# Patient Record
Sex: Male | Born: 1960 | Race: White | Hispanic: No | Marital: Married | Smoking: Former smoker
Health system: Southern US, Community
[De-identification: ages and names within clinical notes are randomized; demographics above are authoritative.]

## PROBLEM LIST (undated history)

## (undated) DIAGNOSIS — S8291XA Unspecified fracture of right lower leg, initial encounter for closed fracture: Secondary | ICD-10-CM

## (undated) DIAGNOSIS — R519 Headache, unspecified: Secondary | ICD-10-CM

## (undated) DIAGNOSIS — S066X9A Traumatic subarachnoid hemorrhage with loss of consciousness of unspecified duration, initial encounter: Secondary | ICD-10-CM

## (undated) DIAGNOSIS — E291 Testicular hypofunction: Secondary | ICD-10-CM

## (undated) DIAGNOSIS — E559 Vitamin D deficiency, unspecified: Secondary | ICD-10-CM

## (undated) DIAGNOSIS — Z8739 Personal history of other diseases of the musculoskeletal system and connective tissue: Secondary | ICD-10-CM

## (undated) DIAGNOSIS — K219 Gastro-esophageal reflux disease without esophagitis: Secondary | ICD-10-CM

## (undated) DIAGNOSIS — R7303 Prediabetes: Secondary | ICD-10-CM

## (undated) DIAGNOSIS — J449 Chronic obstructive pulmonary disease, unspecified: Secondary | ICD-10-CM

## (undated) DIAGNOSIS — S143XXA Injury of brachial plexus, initial encounter: Secondary | ICD-10-CM

## (undated) DIAGNOSIS — Z8781 Personal history of (healed) traumatic fracture: Secondary | ICD-10-CM

## (undated) DIAGNOSIS — E785 Hyperlipidemia, unspecified: Secondary | ICD-10-CM

## (undated) DIAGNOSIS — R51 Headache: Secondary | ICD-10-CM

## (undated) DIAGNOSIS — I10 Essential (primary) hypertension: Secondary | ICD-10-CM

## (undated) DIAGNOSIS — S066XAA Traumatic subarachnoid hemorrhage with loss of consciousness status unknown, initial encounter: Secondary | ICD-10-CM

## (undated) DIAGNOSIS — H9190 Unspecified hearing loss, unspecified ear: Secondary | ICD-10-CM

## (undated) DIAGNOSIS — S8292XA Unspecified fracture of left lower leg, initial encounter for closed fracture: Secondary | ICD-10-CM

## (undated) DIAGNOSIS — G8929 Other chronic pain: Secondary | ICD-10-CM

## (undated) HISTORY — DX: Personal history of other diseases of the musculoskeletal system and connective tissue: Z87.39

## (undated) HISTORY — DX: Essential (primary) hypertension: I10

## (undated) HISTORY — DX: Injury of brachial plexus, initial encounter: S14.3XXA

## (undated) HISTORY — DX: Unspecified fracture of left lower leg, initial encounter for closed fracture: S82.92XA

## (undated) HISTORY — DX: Traumatic subarachnoid hemorrhage with loss of consciousness status unknown, initial encounter: S06.6XAA

## (undated) HISTORY — DX: Personal history of (healed) traumatic fracture: Z87.81

## (undated) HISTORY — DX: Testicular hypofunction: E29.1

## (undated) HISTORY — DX: Vitamin D deficiency, unspecified: E55.9

## (undated) HISTORY — DX: Traumatic subarachnoid hemorrhage with loss of consciousness of unspecified duration, initial encounter: S06.6X9A

## (undated) HISTORY — DX: Unspecified hearing loss, unspecified ear: H91.90

## (undated) HISTORY — PX: HERNIA REPAIR: SHX51

## (undated) HISTORY — PX: UMBILICAL HERNIA REPAIR: SHX196

## (undated) HISTORY — DX: Unspecified fracture of right lower leg, initial encounter for closed fracture: S82.91XA

## (undated) HISTORY — PX: FRACTURE SURGERY: SHX138

---

## 2001-05-28 ENCOUNTER — Encounter: Payer: Self-pay | Admitting: Internal Medicine

## 2001-05-28 ENCOUNTER — Ambulatory Visit (HOSPITAL_COMMUNITY): Admission: RE | Admit: 2001-05-28 | Discharge: 2001-05-28 | Payer: Self-pay | Admitting: Internal Medicine

## 2004-07-16 ENCOUNTER — Ambulatory Visit (HOSPITAL_COMMUNITY): Admission: RE | Admit: 2004-07-16 | Discharge: 2004-07-16 | Payer: Self-pay | Admitting: Internal Medicine

## 2005-07-17 ENCOUNTER — Ambulatory Visit (HOSPITAL_COMMUNITY): Admission: RE | Admit: 2005-07-17 | Discharge: 2005-07-17 | Payer: Self-pay | Admitting: Internal Medicine

## 2006-07-25 ENCOUNTER — Ambulatory Visit (HOSPITAL_COMMUNITY): Admission: RE | Admit: 2006-07-25 | Discharge: 2006-07-25 | Payer: Self-pay | Admitting: Internal Medicine

## 2007-08-26 ENCOUNTER — Ambulatory Visit (HOSPITAL_COMMUNITY): Admission: RE | Admit: 2007-08-26 | Discharge: 2007-08-26 | Payer: Self-pay | Admitting: Internal Medicine

## 2008-10-14 ENCOUNTER — Ambulatory Visit (HOSPITAL_COMMUNITY): Admission: RE | Admit: 2008-10-14 | Discharge: 2008-10-14 | Payer: Self-pay | Admitting: Internal Medicine

## 2009-07-14 ENCOUNTER — Encounter: Admission: RE | Admit: 2009-07-14 | Discharge: 2009-07-14 | Payer: Self-pay | Admitting: General Surgery

## 2009-11-14 ENCOUNTER — Encounter (INDEPENDENT_AMBULATORY_CARE_PROVIDER_SITE_OTHER): Payer: Self-pay | Admitting: *Deleted

## 2009-11-17 ENCOUNTER — Encounter (INDEPENDENT_AMBULATORY_CARE_PROVIDER_SITE_OTHER): Payer: Self-pay | Admitting: *Deleted

## 2009-11-21 ENCOUNTER — Ambulatory Visit: Payer: Self-pay | Admitting: Gastroenterology

## 2009-12-08 ENCOUNTER — Ambulatory Visit: Payer: Self-pay | Admitting: Gastroenterology

## 2009-12-08 HISTORY — PX: COLONOSCOPY: SHX174

## 2009-12-14 LAB — HM COLONOSCOPY: HM Colonoscopy: NORMAL

## 2010-07-29 ENCOUNTER — Encounter: Payer: Self-pay | Admitting: Internal Medicine

## 2010-08-09 NOTE — Procedures (Signed)
Summary: Colonoscopy  Patient: [name] Note: All result statuses are Final unless otherwise noted.  Tests: (1) Colonoscopy (COL)   COL Colonoscopy           DONE     [facility]     [address]     [place], [place]  [address]           COLONOSCOPY PROCEDURE REPORT           PATIENT:  [name], [name]  MR#:  [id number]     BIRTHDATE:  [birth year], 49 yrs. old  GENDER:  male     ENDOSCOPIST:  Vania Rea. Jarold Motto, MD, [facility]     REF. BY:  Lucky Cowboy, M.D.     PROCEDURE DATE:  12/08/2009     PROCEDURE:  Average-risk screening colonoscopy     G0121     ASA CLASS:  Class I     INDICATIONS:  Routine Risk Screening     MEDICATIONS:   Fentanyl 100 mcg IV, Versed 10 mg           DESCRIPTION OF PROCEDURE:   After the risks benefits and     alternatives of the procedure were thoroughly explained, informed     consent was obtained.  Digital rectal exam was performed and     revealed no abnormalities.   The LB CF-[id number] endoscope     was introduced through the anus and advanced to the cecum, which     was identified by both the appendix and ileocecal valve, without     limitations.  The quality of the prep was excellent, using     MoviPrep.  The instrument was then slowly withdrawn as the colon     was fully examined.     <<PROCEDUREIMAGES>>           FINDINGS:  Scattered diverticula were found in the sigmoid to     descending colon segments.  Internal hemorrhoids were found.  No     polyps or cancers were seen.   Retroflexed views in the rectum     revealed no abnormalities.    The scope was then withdrawn from     the patient and the procedure completed.           COMPLICATIONS:  None     ENDOSCOPIC IMPRESSION:     1) Diverticula, scattered in the sigmoid to descending colon     segments     2) Internal hemorrhoids     3) No polyps or cancers     RECOMMENDATIONS:     1) high fiber diet     2) Continue current colorectal screening recommendations for  "routine risk" patients with a repeat colonoscopy in 10 years.     REPEAT EXAM:  No           ______________________________     Vania Rea. Jarold Motto, MD, [facility]           CC:           n.     eSIGNED:   Vania Rea. Patterson at 12/08/2009 01:56 PM           Doroteo Bradford, [id number]  Note: An exclamation mark (!) indicates a result that was not dispersed into the flowsheet. Document Creation Date: 12/08/2009 1:57 PM _______________________________________________________________________  (1) Order result status: Final Collection or observation date-time: 12/08/2009 13:51 Requested date-time:  Receipt date-time:  Reported date-time:  Referring Physician:   Ordering Physician: Onalee Hua  Jarold Motto [phone]) Specimen Source:  Source: Launa Grill Order Number: [id number] Lab site:   Appended Document: Colonoscopy    Clinical Lists Changes  Observations: Added new observation of COLONNXTDUE: 12/2019 (12/08/2009 14:17)

## 2010-08-09 NOTE — Miscellaneous (Signed)
Summary: [facility] Previsit/prep  Clinical Lists Changes  Medications: Added new medication of MOVIPREP 100 GM  SOLR (PEG-KCL-NACL-NASULF-NA ASC-C) As per prep instructions. - Signed Rx of MOVIPREP 100 GM  SOLR (PEG-KCL-NACL-NASULF-NA ASC-C) As per prep instructions.;  #1 x 0;  Signed;  Entered by: Wyona Almas RN;  Authorized by: Mardella Layman MD [facility];  Method used: Electronically to [facility] [address], [place], [place], [place]  [address], Ph: [phone], Fax: [phone] Observations: Added new observation of NKA: T (11/21/2009 10:59)    Prescriptions: MOVIPREP 100 GM  SOLR (PEG-KCL-NACL-NASULF-NA ASC-C) As per prep instructions.  #1 x 0   Entered by:   Wyona Almas RN   Authorized by:   Mardella Layman MD [facility]   Signed by:   Wyona Almas RN on 11/21/2009   Method used:   Electronically to        [facility] [address] (retail)       [address], [address]       [place], [place]  [address]       Ph: [phone]       Fax: [phone]   RxID:   [id number]

## 2010-08-09 NOTE — Letter (Signed)
Summary: [facility] Instructions  [facility]  [address] [place], [place] [address]   Phone: [phone]  Fax: [phone]       [name]    1960-10-06    MRN: [id number]        Procedure Day Dorna Bloom:  Farrell Ours  12/08/09     Arrival Time:  12:30pm     Procedure Time:  1:30pm    Location of Procedure:                    _X _  [facility] (4th Floor)                        PREPARATION FOR COLONOSCOPY WITH MOVIPREP   Starting 5 days prior to your procedure  SUNDAY 12/03/09  do not eat nuts, seeds, popcorn, corn, beans, peas,  salads, or any raw vegetables.  Do not take any fiber supplements (e.g. Metamucil, Citrucel, and Benefiber).  THE DAY BEFORE YOUR PROCEDURE         DATE: THURSDAY 12/07/09  1.  Drink clear liquids the entire day-NO SOLID FOOD  2.  Do not drink anything colored red or purple.  Avoid juices with pulp.  No orange juice.  3.  Drink at least 64 oz. (8 glasses) of fluid/clear liquids during the day to prevent dehydration and help the prep work efficiently.  CLEAR LIQUIDS INCLUDE: Water Jello Ice Popsicles Tea (sugar ok, no milk/cream) Powdered fruit flavored drinks Coffee (sugar ok, no milk/cream) Gatorade Juice: apple, white grape, white cranberry  Lemonade Clear bullion, consomm, broth Carbonated beverages (any kind) Strained chicken noodle soup Hard Candy                             4.  In the morning, mix first dose of MoviPrep solution:    Empty 1 Pouch A and 1 Pouch B into the disposable container    Add lukewarm drinking water to the top line of the container. Mix to dissolve    Refrigerate (mixed solution should be used within 24 hrs)  5.  Begin drinking the prep at 5:00 p.m. The MoviPrep container is divided by 4 marks.   Every 15 minutes drink the solution down to the next mark (approximately 8 oz) until the full liter is complete.   6.  Follow completed prep with 16 oz of clear liquid of your choice  (Nothing red or purple).  Continue to drink clear liquids until bedtime.  7.  Before going to bed, mix second dose of MoviPrep solution:    Empty 1 Pouch A and 1 Pouch B into the disposable container    Add lukewarm drinking water to the top line of the container. Mix to dissolve    Refrigerate  THE DAY OF YOUR PROCEDURE      DATE:  FRIDAY  12/08/09  Beginning at  8:30 a.m. (5 hours before procedure):         1. Every 15 minutes, drink the solution down to the next mark (approx 8 oz) until the full liter is complete.  2. Follow completed prep with 16 oz. of clear liquid of your choice.    3. You may drink clear liquids until  11:30am  (2 HOURS BEFORE PROCEDURE).   MEDICATION INSTRUCTIONS  Unless otherwise instructed, you should take regular prescription medications with a small sip of water   as early  as possible the morning of your procedure.           OTHER INSTRUCTIONS  You will need a responsible adult at least 51 years of age to accompany you and drive you home.   This person must remain in the waiting room during your procedure.  Wear loose fitting clothing that is easily removed.  Leave jewelry and other valuables at home.  However, you may wish to bring a book to read or  an iPod/MP3 player to listen to music as you wait for your procedure to start.  Remove all body piercing jewelry and leave at home.  Total time from sign-in until discharge is approximately 2-3 hours.  You should go home directly after your procedure and rest.  You can resume normal activities the  day after your procedure.  The day of your procedure you should not:   Drive   Make legal decisions   Operate machinery   Drink alcohol   Return to work  You will receive specific instructions about eating, activities and medications before you leave.    The above instructions have been reviewed and explained to me by   Wyona Almas RN  Nov 21, 2009 11:22 AM     I fully  understand and can verbalize these instructions _____________________________ Date _________

## 2010-08-09 NOTE — Letter (Signed)
Summary: Previsit letter  [facility]  [address] [place], [place] [address]   Phone: [phone]  Fax: [phone]       11/14/2009 MRN: [id number]  [name] [address] [place], [place]  [address]  Dear [name],  Welcome to the Gastroenterology Division at [facility].    You are scheduled to see a nurse for your pre-procedure visit on 11/21/2009 at 11:00AM on the 3rd floor at [facility], [address]  We ask that you try to arrive at our office 15 minutes prior to your appointment time to allow for check-in.  Your nurse visit will consist of discussing your medical and surgical history, your immediate family medical history, and your medications.    Please bring a complete list of all your medications or, if you prefer, bring the medication bottles and we will list them.  We will need to be aware of both prescribed and over the counter drugs.  We will need to know exact dosage information as well.  If you are on blood thinners (Coumadin, Plavix, Aggrenox, Ticlid, etc.) please call our office today/prior to your appointment, as we need to consult with your physician about holding your medication.   Please be prepared to read and sign documents such as consent forms, a financial agreement, and acknowledgement forms.  If necessary, and with your consent, a friend or relative is welcome to sit-in on the nurse visit with you.  Please bring your insurance card so that we may make a copy of it.  If your insurance requires a referral to see a specialist, please bring your referral form from your primary care physician.  No co-pay is required for this nurse visit.     If you cannot keep your appointment, please call [phone] to cancel or reschedule prior to your appointment date.  This allows [place] the opportunity to schedule an appointment for another patient in need of care.    Thank you for choosing [facility] for your medical needs.   We appreciate the opportunity to care for you.  Please visit [place] at our website  to learn more about our practice.                     Sincerely.                                                                                                                   The Gastroenterology Division

## 2012-04-02 ENCOUNTER — Other Ambulatory Visit (HOSPITAL_COMMUNITY): Payer: Self-pay | Admitting: Internal Medicine

## 2012-04-02 ENCOUNTER — Ambulatory Visit (HOSPITAL_COMMUNITY)
Admission: RE | Admit: 2012-04-02 | Discharge: 2012-04-02 | Disposition: A | Discharge status: Home / Self Care | Payer: BC Managed Care – PPO | Source: Ambulatory Visit | Attending: Internal Medicine | Admitting: Internal Medicine

## 2012-04-02 DIAGNOSIS — Z87891 Personal history of nicotine dependence: Secondary | ICD-10-CM | POA: Insufficient documentation

## 2012-04-02 DIAGNOSIS — I10 Essential (primary) hypertension: Secondary | ICD-10-CM

## 2012-08-02 ENCOUNTER — Encounter (HOSPITAL_COMMUNITY): Payer: Self-pay | Admitting: *Deleted

## 2012-08-02 ENCOUNTER — Emergency Department (INDEPENDENT_AMBULATORY_CARE_PROVIDER_SITE_OTHER): Payer: BC Managed Care – PPO

## 2012-08-02 ENCOUNTER — Emergency Department (HOSPITAL_COMMUNITY)
Admission: EM | Admit: 2012-08-02 | Discharge: 2012-08-02 | Disposition: A | Discharge status: Home / Self Care | Payer: BC Managed Care – PPO | Source: Home / Self Care | Attending: Family Medicine | Admitting: Family Medicine

## 2012-08-02 DIAGNOSIS — Z79899 Other long term (current) drug therapy: Secondary | ICD-10-CM | POA: Insufficient documentation

## 2012-08-02 DIAGNOSIS — J45909 Unspecified asthma, uncomplicated: Secondary | ICD-10-CM | POA: Insufficient documentation

## 2012-08-02 DIAGNOSIS — Z7982 Long term (current) use of aspirin: Secondary | ICD-10-CM | POA: Insufficient documentation

## 2012-08-02 DIAGNOSIS — J45901 Unspecified asthma with (acute) exacerbation: Secondary | ICD-10-CM

## 2012-08-02 DIAGNOSIS — E785 Hyperlipidemia, unspecified: Secondary | ICD-10-CM | POA: Insufficient documentation

## 2012-08-02 DIAGNOSIS — F17200 Nicotine dependence, unspecified, uncomplicated: Secondary | ICD-10-CM | POA: Insufficient documentation

## 2012-08-02 DIAGNOSIS — K219 Gastro-esophageal reflux disease without esophagitis: Secondary | ICD-10-CM | POA: Insufficient documentation

## 2012-08-02 HISTORY — DX: Hyperlipidemia, unspecified: E78.5

## 2012-08-02 HISTORY — DX: Gastro-esophageal reflux disease without esophagitis: K21.9

## 2012-08-02 MED ORDER — ALBUTEROL SULFATE (5 MG/ML) 0.5% IN NEBU
INHALATION_SOLUTION | RESPIRATORY_TRACT | Status: AC
  Filled 2012-08-02: qty 1

## 2012-08-02 MED ORDER — METHYLPREDNISOLONE SODIUM SUCC 125 MG IJ SOLR
125.0000 mg | Freq: Once | INTRAMUSCULAR | Status: AC
  Administered 2012-08-02: 125 mg via INTRAMUSCULAR

## 2012-08-02 MED ORDER — ALBUTEROL SULFATE (5 MG/ML) 0.5% IN NEBU
5.0000 mg | INHALATION_SOLUTION | Freq: Once | RESPIRATORY_TRACT | Status: AC
  Administered 2012-08-02: 5 mg via RESPIRATORY_TRACT

## 2012-08-02 MED ORDER — METHYLPREDNISOLONE SODIUM SUCC 125 MG IJ SOLR
INTRAMUSCULAR | Status: AC
  Filled 2012-08-02: qty 2

## 2012-08-02 MED ORDER — IPRATROPIUM BROMIDE 0.02 % IN SOLN
0.5000 mg | Freq: Once | RESPIRATORY_TRACT | Status: AC
  Administered 2012-08-02: 0.5 mg via RESPIRATORY_TRACT

## 2012-08-02 MED ORDER — MOXIFLOXACIN HCL 400 MG PO TABS: 400.0000 mg | ORAL_TABLET | Freq: Every day | ORAL | Status: DC

## 2012-08-02 NOTE — ED Provider Notes (Signed)
History     CSN: [id number]  Arrival date & time 08/02/12  1404   First MD Initiated Contact with Patient 08/02/12 1421      Chief Complaint  Patient presents with  . Shortness of Breath  . Cough    (Consider location/radiation/quality/duration/timing/severity/associated sxs/prior treatment) Patient is a 52 y.o. male presenting with shortness of breath and cough. The history is provided by the patient and the spouse.  Shortness of Breath  The current episode started 2 days ago. The problem has been gradually worsening. The problem is moderate. Associated symptoms include rhinorrhea, cough, shortness of breath and wheezing. He has inhaled smoke recently. His past medical history does not include asthma. Past medical history comments: smoker.  Cough Associated symptoms include rhinorrhea, shortness of breath and wheezing. His past medical history does not include asthma. Past medical history comments: smoker.    Past Medical History  Diagnosis Date  . Hyperlipidemia   . GERD (gastroesophageal reflux disease)     Past Surgical History  Procedure Date  . Hernia repair     No family history on file.  History  Substance Use Topics  . Smoking status: Current Every Day Smoker -- 0.5 packs/day    Types: Cigarettes  . Smokeless tobacco: Not on file  . Alcohol Use: Yes     Comment: occasional      Review of Systems  Constitutional: Positive for activity change, appetite change and fatigue.  HENT: Positive for congestion, rhinorrhea and postnasal drip.   Eyes: Negative.   Respiratory: Positive for cough, shortness of breath and wheezing.   Gastrointestinal: Negative.   Genitourinary: Negative.     Allergies  Review of patient's allergies indicates no known allergies.  Home Medications   Current Outpatient Rx  Name  Route  Sig  Dispense  Refill  . ASPIRIN 81 MG PO TABS   Oral   Take 81 mg by mouth daily.         Marland Kitchen VITAMIN D PO   Oral   Take by mouth.           . ESOMEPRAZOLE MAGNESIUM 40 MG PO CPDR   Oral   Take 40 mg by mouth daily before breakfast.         . MULTIVITAMIN PO   Oral   Take by mouth.         Marland Kitchen FISH OIL PO   Oral   Take by mouth.         . CRESTOR PO   Oral   Take by mouth.         Marland Kitchen LAMISIL PO   Oral   Take by mouth.         Marland Kitchen MOXIFLOXACIN HCL 400 MG PO TABS   Oral   Take 1 tablet (400 mg total) by mouth daily. One tab daily   7 tablet   0     BP 134/81  Pulse 90  Temp 97.3 F (36.3 C) (Oral)  Resp 22  SpO2 93%  Physical Exam  Nursing note and vitals reviewed. Constitutional: He is oriented to person, place, and time. He appears well-developed and well-nourished.  HENT:  Head: Normocephalic.  Right Ear: External ear normal.  Left Ear: External ear normal.  Mouth/Throat: Oropharynx is clear and moist.  Eyes: Pupils are equal, round, and reactive to light.  Neck: Normal range of motion. Neck supple.  Cardiovascular: Normal rate, regular rhythm and normal heart sounds.   Pulmonary/Chest: He has wheezes. He  has rales.  Lymphadenopathy:    He has no cervical adenopathy.  Neurological: He is alert and oriented to person, place, and time.  Skin: Skin is warm and dry.    ED Course  Procedures (including critical care time)  Labs Reviewed - No data to display Dg Chest 2 View  08/02/2012  *RADIOLOGY REPORT*  Clinical Data: Shortness of breath.  CHEST - 2 VIEW  Comparison: 04/02/2012  Findings: Airway thickening may reflect bronchitis or reactive airways disease.  No airspace opacity is identified to suggest bacterial pneumonia pattern.  Cardiac and mediastinal contours appear unremarkable.  No pleural effusion identified.  IMPRESSION:  1. Airway thickening may reflect bronchitis or reactive airways disease.  No airspace opacity is identified to suggest bacterial pneumonia pattern.   Original Report Authenticated By: Gaylyn Rong, M.D.      1. Acute asthmatic bronchitis       MDM   Sx improved and peak flow improved after neb.        Linna Hoff, MD 08/02/12 1504

## 2012-08-02 NOTE — ED Notes (Signed)
Breathing treatment in progress

## 2012-08-02 NOTE — ED Notes (Signed)
C/O dyspnea, productive cough, "tightness in lungs" since Friday.  "I just can't catch my breath".  I&E wheezing noted.  Denies fevers, denies hx asthma or COPD.

## 2012-08-03 ENCOUNTER — Emergency Department (HOSPITAL_COMMUNITY): Payer: BC Managed Care – PPO

## 2012-08-03 ENCOUNTER — Emergency Department (HOSPITAL_COMMUNITY)
Admission: EM | Admit: 2012-08-03 | Discharge: 2012-08-03 | Disposition: A | Discharge status: Home / Self Care | Payer: BC Managed Care – PPO | Attending: Emergency Medicine | Admitting: Emergency Medicine

## 2012-08-03 ENCOUNTER — Encounter (HOSPITAL_COMMUNITY): Payer: Self-pay | Admitting: Emergency Medicine

## 2012-08-03 DIAGNOSIS — J45909 Unspecified asthma, uncomplicated: Secondary | ICD-10-CM

## 2012-08-03 MED ORDER — ALBUTEROL SULFATE (5 MG/ML) 0.5% IN NEBU
5.0000 mg | INHALATION_SOLUTION | Freq: Once | RESPIRATORY_TRACT | Status: AC
  Administered 2012-08-03: 5 mg via RESPIRATORY_TRACT
  Filled 2012-08-03: qty 40

## 2012-08-03 MED ORDER — METHYLPREDNISOLONE 4 MG PO KIT: PACK | ORAL | Status: DC

## 2012-08-03 MED ORDER — IPRATROPIUM BROMIDE 0.02 % IN SOLN
0.5000 mg | Freq: Once | RESPIRATORY_TRACT | Status: AC
  Administered 2012-08-03: 0.5 mg via RESPIRATORY_TRACT
  Filled 2012-08-03: qty 2.5

## 2012-08-03 MED ORDER — ALBUTEROL SULFATE HFA 108 (90 BASE) MCG/ACT IN AERS
2.0000 | INHALATION_SPRAY | RESPIRATORY_TRACT | Status: DC | PRN
  Administered 2012-08-03: 2 via RESPIRATORY_TRACT
  Filled 2012-08-03: qty 6.7

## 2012-08-03 MED ORDER — AEROCHAMBER PLUS W/MASK MISC
Status: AC
  Administered 2012-08-03: 01:00:00
  Filled 2012-08-03: qty 1

## 2012-08-03 MED ORDER — ALBUTEROL SULFATE (5 MG/ML) 0.5% IN NEBU
5.0000 mg | INHALATION_SOLUTION | Freq: Once | RESPIRATORY_TRACT | Status: AC
  Administered 2012-08-03: 5 mg via RESPIRATORY_TRACT
  Filled 2012-08-03: qty 1

## 2012-08-03 NOTE — ED Notes (Signed)
PT. REPORTS PERSISTENT PRODUCTIVE COUGH WITH WHEEZING TODAY SEEN HERE THIS AFTERNOON DIAGNOSED WITH ASTHMATIC BRONCHITIS - PRESCRIBED WITH AVELOX ANTIBIOTIC.

## 2012-08-03 NOTE — ED Notes (Signed)
Pt. O2 continues to stay in high 80s on 2L O2, pulse ox changed fingers with no improvement. Dr. Read Drivers aware.

## 2012-08-03 NOTE — ED Provider Notes (Addendum)
History     CSN: [id number]  Arrival date & time 08/02/12  2354   First MD Initiated Contact with Patient 08/03/12 0014      Chief Complaint  Patient presents with  . Shortness of Breath    (Consider location/radiation/quality/duration/timing/severity/associated sxs/prior treatment) HPI This is a 52 year old male with a four-day history of cough, shortness of breath and wheezing. The symptoms were worse with activity and lying supine. His symptoms worsened yesterday and he was seen in our urgent care. He was given albuterol and Atrovent breathing treatment as well as 125 mg of IM Solu-Medrol. He was discharged on a seven-day course of Avelox. He was not given or prescribed an albuterol inhaler. His wheezing and shortness of breath worsened and he came to the ED a brief time ago. He describes the shortness of breath is moderate to severe. He was given an albuterol and Atrovent neb treatment in triage with significant improvement in his symptoms. He states he feels much better now. He denies fever, chills, nausea, vomiting, diarrhea or body aches. Chest x-ray showed bronchitic changes.  Past Medical History  Diagnosis Date  . Hyperlipidemia   . GERD (gastroesophageal reflux disease)     Past Surgical History  Procedure Date  . Hernia repair     No family history on file.  History  Substance Use Topics  . Smoking status: Current Every Day Smoker -- 0.5 packs/day    Types: Cigarettes  . Smokeless tobacco: Not on file  . Alcohol Use: Yes     Comment: occasional      Review of Systems  All other systems reviewed and are negative.    Allergies  Review of patient's allergies indicates no known allergies.  Home Medications   Current Outpatient Rx  Name  Route  Sig  Dispense  Refill  . ASPIRIN 81 MG PO TABS   Oral   Take 81 mg by mouth daily.         Marland Kitchen VITAMIN D PO   Oral   Take by mouth.         . ESOMEPRAZOLE MAGNESIUM 40 MG PO CPDR   Oral   Take 40 mg by  mouth daily before breakfast.         . MUCINEX PO   Oral   Take 1 tablet by mouth every 6 (six) hours as needed. For bronchitis symptoms         . MOXIFLOXACIN HCL 400 MG PO TABS   Oral   Take 1 tablet (400 mg total) by mouth daily. One tab daily   7 tablet   0   . MULTIVITAMIN PO   Oral   Take by mouth.         Marland Kitchen FISH OIL PO   Oral   Take by mouth.         . CRESTOR PO   Oral   Take by mouth.         Marland Kitchen LAMISIL PO   Oral   Take by mouth.           BP 172/91  Pulse 109  Temp 98.1 F (36.7 C) (Oral)  Resp 16  SpO2 94%  Physical Exam General: Well-developed, well-nourished male in no acute distress; appearance consistent with age of record HENT: normocephalic, atraumatic Eyes: pupils equal round and reactive to light; extraocular muscles intact Neck: supple Heart: regular rate and rhythm; tachycardic Lungs: Faint is respiratory and expiratory wheezes; no tachypnea Abdomen: soft; nondistended; nontenderbowel  sounds present Extremities: No deformity; full range of motion; pulses normal Neurologic: Awake, alert and oriented; motor function intact in all extremities and symmetric; no facial droop Skin: Warm and dry Psychiatric: Normal mood and affect    ED Course  Procedures (including critical care time)     MDM  Nursing notes and vitals signs, including pulse oximetry, reviewed.  Summary of this visit's results, reviewed by myself:  Imaging Studies: Dg Chest 2 View  08/03/2012  *RADIOLOGY REPORT*  Clinical Data: Shortness of breath  CHEST - 2 VIEW  Comparison: 08/02/2012  Findings: Mild central peribronchial thickening is similar to minimally increased.  No confluent airspace opacity.  No pleural effusion or pneumothorax.  Cardiac and mediastinal contours are otherwise within normal range.  IMPRESSION: Mild central peribronchial thickening without confluent airspace opacity.   Original Report Authenticated By: Jearld Lesch, M.D.    Dg  Chest 2 View  08/02/2012  *RADIOLOGY REPORT*  Clinical Data: Shortness of breath.  CHEST - 2 VIEW  Comparison: 04/02/2012  Findings: Airway thickening may reflect bronchitis or reactive airways disease.  No airspace opacity is identified to suggest bacterial pneumonia pattern.  Cardiac and mediastinal contours appear unremarkable.  No pleural effusion identified.  IMPRESSION:  1. Airway thickening may reflect bronchitis or reactive airways disease.  No airspace opacity is identified to suggest bacterial pneumonia pattern.   Original Report Authenticated By: Gaylyn Rong, M.D.    1:12 AM Patient's symptoms improved significantly. Some faint wheezing remains. We will provide him with an albuterol inhaler.  2:42 AM Wheezing resolved after second albuterol and Atrovent neb treatment but patient's oxygen saturation is 88% on room air. Will repeat chest x-ray.  4:21 AM Patient feeling better after third nebulizer treatment. Patient is oxygen saturation is 91-92% on room air. Will send him home on Medrol dose pack. He will continue his Avelox. He has been given an inhaler in shown how to use it.    Hanley Seamen, MD 08/03/12 0113  Hanley Seamen, MD 08/03/12 0981

## 2012-08-03 NOTE — ED Notes (Signed)
Pt. Reports breathing is feeling tight again and that he has started coughing. O2 88%-92% on RA. Dr. Read Drivers notified.

## 2013-04-12 ENCOUNTER — Other Ambulatory Visit (HOSPITAL_COMMUNITY): Payer: Self-pay | Admitting: Internal Medicine

## 2013-04-12 ENCOUNTER — Ambulatory Visit (HOSPITAL_COMMUNITY)
Admission: RE | Admit: 2013-04-12 | Discharge: 2013-04-12 | Disposition: A | Discharge status: Home / Self Care | Payer: BC Managed Care – PPO | Source: Ambulatory Visit | Attending: Internal Medicine | Admitting: Internal Medicine

## 2013-04-12 DIAGNOSIS — I10 Essential (primary) hypertension: Secondary | ICD-10-CM | POA: Insufficient documentation

## 2013-04-12 DIAGNOSIS — F17200 Nicotine dependence, unspecified, uncomplicated: Secondary | ICD-10-CM | POA: Insufficient documentation

## 2013-04-12 DIAGNOSIS — J8410 Pulmonary fibrosis, unspecified: Secondary | ICD-10-CM | POA: Insufficient documentation

## 2013-07-18 ENCOUNTER — Encounter: Payer: Self-pay | Admitting: Physician Assistant

## 2013-07-18 DIAGNOSIS — R7303 Prediabetes: Secondary | ICD-10-CM

## 2013-07-18 DIAGNOSIS — R7309 Other abnormal glucose: Secondary | ICD-10-CM | POA: Insufficient documentation

## 2013-07-18 DIAGNOSIS — E291 Testicular hypofunction: Secondary | ICD-10-CM | POA: Insufficient documentation

## 2013-07-18 DIAGNOSIS — E785 Hyperlipidemia, unspecified: Secondary | ICD-10-CM | POA: Insufficient documentation

## 2013-07-18 DIAGNOSIS — I10 Essential (primary) hypertension: Secondary | ICD-10-CM

## 2013-07-18 DIAGNOSIS — E559 Vitamin D deficiency, unspecified: Secondary | ICD-10-CM

## 2013-07-18 DIAGNOSIS — K219 Gastro-esophageal reflux disease without esophagitis: Secondary | ICD-10-CM

## 2013-07-18 DIAGNOSIS — E1169 Type 2 diabetes mellitus with other specified complication: Secondary | ICD-10-CM | POA: Insufficient documentation

## 2013-07-18 DIAGNOSIS — E782 Mixed hyperlipidemia: Secondary | ICD-10-CM | POA: Insufficient documentation

## 2013-07-20 ENCOUNTER — Encounter: Payer: Self-pay | Admitting: Physician Assistant

## 2013-07-20 ENCOUNTER — Ambulatory Visit (INDEPENDENT_AMBULATORY_CARE_PROVIDER_SITE_OTHER): Payer: BC Managed Care – PPO | Admitting: Physician Assistant

## 2013-07-20 VITALS — BP 122/78 | HR 68 | Temp 98.1°F | Resp 16 | Ht 72.5 in | Wt 222.0 lb

## 2013-07-20 DIAGNOSIS — E559 Vitamin D deficiency, unspecified: Secondary | ICD-10-CM

## 2013-07-20 DIAGNOSIS — I10 Essential (primary) hypertension: Secondary | ICD-10-CM

## 2013-07-20 DIAGNOSIS — Z79899 Other long term (current) drug therapy: Secondary | ICD-10-CM

## 2013-07-20 DIAGNOSIS — R7309 Other abnormal glucose: Secondary | ICD-10-CM

## 2013-07-20 DIAGNOSIS — R7303 Prediabetes: Secondary | ICD-10-CM

## 2013-07-20 DIAGNOSIS — E782 Mixed hyperlipidemia: Secondary | ICD-10-CM

## 2013-07-20 DIAGNOSIS — E785 Hyperlipidemia, unspecified: Secondary | ICD-10-CM

## 2013-07-20 LAB — LIPID PANEL
Cholesterol: 171 mg/dL (ref 0–200)
HDL: 53 mg/dL (ref >39)
LDL Cholesterol: 96 mg/dL (ref 0–99)
Total CHOL/HDL Ratio: 3.2 Ratio
Triglycerides: 108 mg/dL (ref <150)
VLDL: 22 mg/dL (ref 0–40)

## 2013-07-20 LAB — HEPATIC FUNCTION PANEL
ALT: 32 U/L (ref 0–53)
AST: 25 U/L (ref 0–37)
Albumin: 4.5 g/dL (ref 3.5–5.2)
Alkaline Phosphatase: 72 U/L (ref 39–117)
Bilirubin, Direct: 0.1 mg/dL (ref 0.0–0.3)
Indirect Bilirubin: 0.4 mg/dL (ref 0.0–0.9)
Total Bilirubin: 0.5 mg/dL (ref 0.3–1.2)
Total Protein: 6.8 g/dL (ref 6.0–8.3)

## 2013-07-20 LAB — BASIC METABOLIC PANEL WITH GFR
BUN: 14 mg/dL (ref 6–23)
CO2: 26 mEq/L (ref 19–32)
Calcium: 9.5 mg/dL (ref 8.4–10.5)
Chloride: 105 mEq/L (ref 96–112)
Creat: 0.73 mg/dL (ref 0.50–1.35)
GFR, Est African American: >89 mL/min
GFR, Est Non African American: >89 mL/min
Glucose, Bld: 93 mg/dL (ref 70–99)
Potassium: 4.1 mEq/L (ref 3.5–5.3)
Sodium: 138 mEq/L (ref 135–145)

## 2013-07-20 LAB — CBC WITH DIFFERENTIAL/PLATELET
Basophils Absolute: 0 10*3/uL (ref 0.0–0.1)
Basophils Relative: 0 % (ref 0–1)
Eosinophils Absolute: 0.2 10*3/uL (ref 0.0–0.7)
Eosinophils Relative: 2 % (ref 0–5)
HCT: 46.2 % (ref 39.0–52.0)
Hemoglobin: 15.8 g/dL (ref 13.0–17.0)
Lymphocytes Relative: 24 % (ref 12–46)
Lymphs Abs: 2.1 10*3/uL (ref 0.7–4.0)
MCH: 29.8 pg (ref 26.0–34.0)
MCHC: 34.2 g/dL (ref 30.0–36.0)
MCV: 87 fL (ref 78.0–100.0)
Monocytes Absolute: 0.6 10*3/uL (ref 0.1–1.0)
Monocytes Relative: 7 % (ref 3–12)
Neutro Abs: 6.1 10*3/uL (ref 1.7–7.7)
Neutrophils Relative %: 67 % (ref 43–77)
Platelets: 174 10*3/uL (ref 150–400)
RBC: 5.31 MIL/uL (ref 4.22–5.81)
RDW: 13.8 % (ref 11.5–15.5)
WBC: 9.1 10*3/uL (ref 4.0–10.5)

## 2013-07-20 LAB — TSH: TSH: 1.393 u[IU]/mL (ref 0.350–4.500)

## 2013-07-20 LAB — MAGNESIUM: Magnesium: 1.9 mg/dL (ref 1.5–2.5)

## 2013-07-20 NOTE — Progress Notes (Signed)
HPI Patient presents for 3 month follow up with hypertension, hyperlipidemia, prediabetes and vitamin D. Patient's blood pressure has been controlled at home, today their BP is BP: 122/78 mmHg  Patient denies chest pain, shortness of breath, dizziness.  Patient's cholesterol is diet controlled. In addition they are on lipitor and denies myalgias. The cholesterol last visit was LDL 86.  The patient has not been working on diet and exercise for prediabetes, and denies changes in vision, polys, and paresthesias. A1C 5.9.  Patient is on Vitamin D supplement.   Current Medications:  Current Outpatient Prescriptions on File Prior to Visit  Medication Sig Dispense Refill  . aspirin 81 MG tablet Take 81 mg by mouth daily.      Marland Kitchen atorvastatin (LIPITOR) 80 MG tablet Take 80 mg by mouth daily. 1/2-1 pill nightly      . Cholecalciferol (VITAMIN D PO) Take 10,000 Int'l Units by mouth.       . esomeprazole (NEXIUM) 40 MG capsule Take 40 mg by mouth daily before breakfast.      . Multiple Vitamins-Minerals (MULTIVITAMIN PO) Take by mouth.      . Omega-3 Fatty Acids (FISH OIL PO) Take by mouth.       No current facility-administered medications on file prior to visit.   Medical History:  Past Medical History  Diagnosis Date  . Hyperlipidemia   . GERD (gastroesophageal reflux disease)   . Hypertension   . Prediabetes   . Vitamin D deficiency   . Hypogonadism male   . Allergy    Allergies: No Known Allergies  ROS Constitutional: Denies fever, chills, headaches, insomnia, fatigue, night sweats Eyes: Denies redness, blurred vision, diplopia, discharge, itchy, watery eyes.  ENT: Denies congestion, post nasal drip, sore throat, earache, dental pain, Tinnitus, Vertigo, Sinus pain, snoring.  Cardio: Denies chest pain, palpitations, irregular heartbeat, dyspnea, diaphoresis, orthopnea, PND, claudication, edema Respiratory: denies cough, shortness of breath, wheezing.  Gastrointestinal: Denies dysphagia,  heartburn, AB pain/ cramps, N/V, diarrhea, constipation, hematemesis, melena, hematochezia,  hemorrhoids Genitourinary: Denies dysuria, frequency, urgency, nocturia, hesitancy, discharge, hematuria, flank pain Musculoskeletal: Denies myalgia, stiffness, pain, swelling and strain/sprain. Skin: Denies pruritis, rash, changing in skin lesion Neuro: Denies Weakness, tremor, incoordination, spasms, pain Psychiatric: Denies confusion, memory loss, sensory loss Endocrine: Denies change in weight, skin, hair change, nocturia Diabetic Polys, Denies visual blurring, hyper /hypo glycemic episodes, and paresthesia, Heme/Lymph: Denies Excessive bleeding, bruising, enlarged lymph nodes  Family history- Review and unchanged Social history- Review and unchanged Physical Exam: Filed Vitals:   07/20/13 0903  BP: 122/78  Pulse: 68  Temp: 98.1 F (36.7 C)  Resp: 16   Filed Weights   07/20/13 0903  Weight: 222 lb (100.699 kg)   General Appearance: Well nourished, in no apparent distress. Eyes: PERRLA, EOMs, conjunctiva no swelling or erythema Sinuses: No Frontal/maxillary tenderness ENT/Mouth: Ext aud canals clear, TMs without erythema, bulging. No erythema, swelling, or exudate on post pharynx.  Tonsils not swollen or erythematous. Hearing normal.  Neck: Supple, thyroid normal.  Respiratory: Respiratory effort normal, BS equal bilaterally without rales, rhonchi, wheezing or stridor.  Cardio: RRR with no MRGs. Brisk peripheral pulses without edema.  Abdomen: Soft, + BS.  Non tender, no guarding, rebound, hernias, masses. Lymphatics: Non tender without lymphadenopathy.  Musculoskeletal: Full ROM, 5/5 strength, normal gait.  Skin: Warm, dry without rashes, lesions, ecchymosis.  Neuro: Cranial nerves intact. Normal muscle tone, no cerebellar symptoms. Sensation intact.  Psych: Awake and oriented X 3, normal affect, Insight and Judgment  appropriate.   Assessment and Plan:  Hypertension: Continue  medication, monitor blood pressure at home.  Continue DASH diet. Cholesterol: Continue diet and exercise. Check cholesterol.  Pre-diabetes-Continue diet and exercise. Check A1C Vitamin D Def- check level and continue medications.   Continue diet and meds as discussed. Further disposition pending results of labs.  Vicie Mutters 9:21 AM

## 2013-07-20 NOTE — Patient Instructions (Signed)
 Bad carbs also include fruit juice, alcohol, and sweet tea. These are empty calories that do not signal to your brain that you are full.   Please remember the good carbs are still carbs which convert into sugar. So please measure them out no more than 1/2-1 cup of rice, oatmeal, pasta, and beans.  Veggies are however free foods! Pile them on.   I like lean protein at every meal such as chicken, turkey, pork chops, cottage cheese, etc. Just do not fry these meats and please center your meal around vegetable, the meats should be a side dish.   No all fruit is created equal. Please see the list below, the fruit at the bottom is higher in sugars than the fruit at the top   Cholesterol Cholesterol is a white, waxy, fat-like protein needed by your body in small amounts. The liver makes all the cholesterol you need. It is carried from the liver by the blood through the blood vessels. Deposits (plaque) may build up on blood vessel walls. This makes the arteries narrower and stiffer. Plaque increases the risk for heart attack and stroke. You cannot feel your cholesterol level even if it is very high. The only way to know is by a blood test to check your lipid (fats) levels. Once you know your cholesterol levels, you should keep a record of the test results. Work with your caregiver to to keep your levels in the desired range. WHAT THE RESULTS MEAN:  Total cholesterol is a rough measure of all the cholesterol in your blood.  LDL is the so-called bad cholesterol. This is the type that deposits cholesterol in the walls of the arteries. You want this level to be low.  HDL is the good cholesterol because it cleans the arteries and carries the LDL away. You want this level to be high.  Triglycerides are fat that the body can either burn for energy or store. High levels are closely linked to heart disease. DESIRED LEVELS:  Total cholesterol below 200.  LDL below 100 for people at risk, below 70 for very  high risk.  HDL above 50 is good, above 60 is best.  Triglycerides below 150. HOW TO LOWER YOUR CHOLESTEROL:  Diet.  Choose fish or white meat chicken and turkey, roasted or baked. Limit fatty cuts of red meat, fried foods, and processed meats, such as sausage and lunch meat.  Eat lots of fresh fruits and vegetables. Choose whole grains, beans, pasta, potatoes and cereals.  Use only small amounts of olive, corn or canola oils. Avoid butter, mayonnaise, shortening or palm kernel oils. Avoid foods with trans-fats.  Use skim/nonfat milk and low-fat/nonfat yogurt and cheeses. Avoid whole milk, cream, ice cream, egg yolks and cheeses. Healthy desserts include angel food cake, ginger snaps, animal crackers, hard candy, popsicles, and low-fat/nonfat frozen yogurt. Avoid pastries, cakes, pies and cookies.  Exercise.  A regular program helps decrease LDL and raises HDL.  Helps with weight control.  Do things that increase your activity level like gardening, walking, or taking the stairs.  Medication.  May be prescribed by your caregiver to help lowering cholesterol and the risk for heart disease.  You may need medicine even if your levels are normal if you have several risk factors. HOME CARE INSTRUCTIONS   Follow your diet and exercise programs as suggested by your caregiver.  Take medications as directed.  Have blood work done when your caregiver feels it is necessary. MAKE SURE YOU:   Understand   these instructions.  Will watch your condition.  Will get help right away if you are not doing well or get worse. Document Released: 03/19/2001 Document Revised: 09/16/2011 Document Reviewed: 04/07/2013 [employer] Patient Information 2014 [employer].  

## 2013-07-21 LAB — HEMOGLOBIN A1C
Hgb A1c MFr Bld: 6.2 % — ABNORMAL HIGH (ref <5.7)
Mean Plasma Glucose: 131 mg/dL — ABNORMAL HIGH (ref <117)

## 2013-07-21 LAB — VITAMIN D 25 HYDROXY (VIT D DEFICIENCY, FRACTURES): Vit D, 25-Hydroxy: 57 ng/mL (ref 30–89)

## 2013-10-19 ENCOUNTER — Other Ambulatory Visit: Payer: Self-pay | Admitting: Internal Medicine

## 2013-10-22 ENCOUNTER — Encounter: Payer: Self-pay | Admitting: Internal Medicine

## 2013-10-22 ENCOUNTER — Ambulatory Visit (INDEPENDENT_AMBULATORY_CARE_PROVIDER_SITE_OTHER): Payer: BC Managed Care – PPO | Admitting: Internal Medicine

## 2013-10-22 VITALS — BP 124/70 | HR 64 | Temp 97.7°F | Resp 16 | Ht 72.5 in | Wt 218.4 lb

## 2013-10-22 DIAGNOSIS — I10 Essential (primary) hypertension: Secondary | ICD-10-CM

## 2013-10-22 DIAGNOSIS — Z79899 Other long term (current) drug therapy: Secondary | ICD-10-CM

## 2013-10-22 DIAGNOSIS — E785 Hyperlipidemia, unspecified: Secondary | ICD-10-CM

## 2013-10-22 DIAGNOSIS — R7309 Other abnormal glucose: Secondary | ICD-10-CM

## 2013-10-22 DIAGNOSIS — R7303 Prediabetes: Secondary | ICD-10-CM

## 2013-10-22 DIAGNOSIS — E559 Vitamin D deficiency, unspecified: Secondary | ICD-10-CM

## 2013-10-22 LAB — CBC WITH DIFFERENTIAL/PLATELET
Basophils Absolute: 0.1 10*3/uL (ref 0.0–0.1)
Basophils Relative: 1 % (ref 0–1)
Eosinophils Absolute: 0.3 10*3/uL (ref 0.0–0.7)
Eosinophils Relative: 4 % (ref 0–5)
HCT: 43.9 % (ref 39.0–52.0)
Hemoglobin: 15.7 g/dL (ref 13.0–17.0)
Lymphocytes Relative: 23 % (ref 12–46)
Lymphs Abs: 2 10*3/uL (ref 0.7–4.0)
MCH: 30.3 pg (ref 26.0–34.0)
MCHC: 35.8 g/dL (ref 30.0–36.0)
MCV: 84.7 fL (ref 78.0–100.0)
Monocytes Absolute: 0.4 10*3/uL (ref 0.1–1.0)
Monocytes Relative: 5 % (ref 3–12)
Neutro Abs: 5.8 10*3/uL (ref 1.7–7.7)
Neutrophils Relative %: 67 % (ref 43–77)
Platelets: 154 10*3/uL (ref 150–400)
RBC: 5.18 MIL/uL (ref 4.22–5.81)
RDW: 13.8 % (ref 11.5–15.5)
WBC: 8.6 10*3/uL (ref 4.0–10.5)

## 2013-10-22 LAB — HEMOGLOBIN A1C
Hgb A1c MFr Bld: 5.9 % — ABNORMAL HIGH (ref <5.7)
Mean Plasma Glucose: 123 mg/dL — ABNORMAL HIGH (ref <117)

## 2013-10-22 NOTE — Patient Instructions (Signed)

## 2013-10-22 NOTE — Progress Notes (Signed)
Patient ID: [name], male   DOB: 1960-11-13, 53 y.o.   MRN: [id number]    This very nice 53 y.o. MWM presents for 54 month follow up with Hypertension, Hyperlipidemia, Pre-Diabetes and Vitamin D Deficiency.    Elevated BP predates since 2006 and has been monitored expectantly. BP has been controlled at home. Today's BP: 124/70 mmHg. Patient denies any cardiac type chest pain, palpitations, dyspnea/orthopnea/PND, dizziness, claudication, or dependent edema.   Hyperlipidemia is controlled with diet & meds. Last Cholesterol was 171, Triglycerides were 108, HDL 53 and LDL 96 in Jan 2015 - all at goal. Patient denies myalgias or other med SE's.    Also, the patient has history of PreDiabetes with A1c 6.0% in 2009 and last A1c was 6.2% in Jan 2015. Patient denies any symptoms of reactive hypoglycemia, diabetic polys, paresthesias or visual blurring.   Further, Patient has history of Vitamin D Deficiency with last vitamin D of 61 in Oct 2014. Patient supplements vitamin D without any suspected side-effects.  Medication Sig  . aspirin 81 MG tablet Take 81 mg by mouth daily.  Marland Kitchen atorvastatin (LIPITOR) 80 MG tablet Take 80 mg by mouth daily. 1/2-1 pill nightly  . Cholecalciferol (VITAMIN D PO) Take 10,000 Int'l Units by mouth.   . esomeprazole (NEXIUM) 40 MG capsule Take 40 mg by mouth daily before breakfast.  . montelukast (SINGULAIR) 10 MG tablet TAKE ONE TABLET BY MOUTH ONCE DAILY   . Multiple Vitamins-Minerals (MULTIVITAMIN PO) Take by mouth.  . Omega-3 Fatty Acids (FISH OIL PO) Take 1,000 mg by mouth 3 (three) times daily.    No Known Allergies  PMHx:   Past Medical History  Diagnosis Date  . Hyperlipidemia   . GERD (gastroesophageal reflux disease)   . Hypertension   . Prediabetes   . Vitamin D deficiency   . Hypogonadism male   . Allergy    FHx:    Reviewed / unchanged  SHx:    Reviewed / unchanged   Systems Review: Constitutional: Denies fever, chills, wt changes, headaches,  insomnia, fatigue, night sweats, change in appetite. Eyes: Denies redness, blurred vision, diplopia, discharge, itchy, watery eyes.  ENT: Denies discharge, congestion, post nasal drip, epistaxis, sore throat, earache, hearing loss, dental pain, tinnitus, vertigo, sinus pain, snoring.  CV: Denies chest pain, palpitations, irregular heartbeat, syncope, dyspnea, diaphoresis, orthopnea, PND, claudication, edema. Respiratory: denies cough, dyspnea, DOE, pleurisy, hoarseness, laryngitis, wheezing.  Gastrointestinal: Denies dysphagia, odynophagia, heartburn, reflux, water brash, abdominal pain or cramps, nausea, vomiting, bloating, diarrhea, constipation, hematemesis, melena, hematochezia,  or hemorrhoids. Genitourinary: Denies dysuria, frequency, urgency, nocturia, hesitancy, discharge, hematuria, flank pain. Musculoskeletal: Denies arthralgias, myalgias, stiffness, jt. swelling, pain, limp, strain/sprain.  Skin: Denies pruritus, rash, hives, warts, acne, eczema, change in skin lesion(s). Neuro: No weakness, tremor, incoordination, spasms, paresthesia, or pain. Psychiatric: Denies confusion, memory loss, or sensory loss. Endo: Denies change in weight, skin, hair change.  Heme/Lymph: No excessive bleeding, bruising, orenlarged lymph nodes.   Exam:  BP 124/70  Pulse 64  Temp 97.7 F   Resp 16  Ht 6' 0.5"   Wt 218 lb 6.4 oz   BMI 29.20 kg/m2   Appears well nourished - in no distress. Eyes: PERRLA, EOMs, conjunctiva no swelling or erythema. Sinuses: No frontal/maxillary tenderness ENT/Mouth: EAC's clear, TM's nl w/o erythema, bulging. Nares clear w/o erythema, swelling, exudates. Oropharynx clear without erythema or exudates. Oral hygiene is good. Tongue normal, non obstructing. Hearing intact.  Neck: Supple. Thyroid nl. Car 2+/2+ without bruits,  nodes or JVD. Chest: Respirations nl with BS clear & equal w/o rales, rhonchi, wheezing or stridor.  Cor: Heart sounds normal w/ regular rate and  rhythm without sig. murmurs, gallops, clicks, or rubs. Peripheral pulses normal and equal  without edema.  Abdomen: Soft & bowel sounds normal. Non-tender w/o guarding, rebound, hernias, masses, or organomegaly.  Lymphatics: Unremarkable.  Musculoskeletal: Full ROM all peripheral extremities, joint stability, 5/5 strength, and normal gait.  Skin: Warm, dry without exposed rashes, lesions, ecchymosis apparent.  Neuro: Cranial nerves intact, reflexes equal bilaterally. Sensory-motor testing grossly intact. Tendon reflexes grossly intact.  Pysch: Alert & oriented x 3. Insight and judgement nl & appropriate. No ideations.  Assessment and Plan:  1. Hypertension, Labile - Continue monitor blood pressure at home. Continue diet/meds same.  2. Hyperlipidemia - Continue diet/meds, exercise,& lifestyle modifications. Continue monitor periodic cholesterol/liver & renal functions   3. Pre-diabetes/Insulin Resistance - Continue diet, exercise, lifestyle modifications. Monitor appropriate labs.  4. Vitamin D Deficiency - Continue supplementation.  Recommended regular exercise, BP monitoring, weight control, and discussed med and SE's. Recommended labs to assess and monitor clinical status. Further disposition pending results of labs.

## 2013-10-23 LAB — BASIC METABOLIC PANEL WITH GFR
BUN: 19 mg/dL (ref 6–23)
CO2: 26 mEq/L (ref 19–32)
Calcium: 9.3 mg/dL (ref 8.4–10.5)
Chloride: 106 mEq/L (ref 96–112)
Creat: 0.75 mg/dL (ref 0.50–1.35)
GFR, Est African American: >89 mL/min
GFR, Est Non African American: >89 mL/min
Glucose, Bld: 101 mg/dL — ABNORMAL HIGH (ref 70–99)
Potassium: 4.3 mEq/L (ref 3.5–5.3)
Sodium: 141 mEq/L (ref 135–145)

## 2013-10-23 LAB — TSH: TSH: 1.357 u[IU]/mL (ref 0.350–4.500)

## 2013-10-23 LAB — LIPID PANEL
Cholesterol: 153 mg/dL (ref 0–200)
HDL: 47 mg/dL (ref >39)
LDL Cholesterol: 88 mg/dL (ref 0–99)
Total CHOL/HDL Ratio: 3.3 Ratio
Triglycerides: 90 mg/dL (ref <150)
VLDL: 18 mg/dL (ref 0–40)

## 2013-10-23 LAB — HEPATIC FUNCTION PANEL
ALT: 29 U/L (ref 0–53)
AST: 25 U/L (ref 0–37)
Albumin: 4.3 g/dL (ref 3.5–5.2)
Alkaline Phosphatase: 77 U/L (ref 39–117)
Bilirubin, Direct: 0.1 mg/dL (ref 0.0–0.3)
Indirect Bilirubin: 0.3 mg/dL (ref 0.2–1.2)
Total Bilirubin: 0.4 mg/dL (ref 0.2–1.2)
Total Protein: 6.6 g/dL (ref 6.0–8.3)

## 2013-10-23 LAB — VITAMIN D 25 HYDROXY (VIT D DEFICIENCY, FRACTURES): Vit D, 25-Hydroxy: 56 ng/mL (ref 30–89)

## 2013-10-23 LAB — INSULIN, FASTING: Insulin fasting, serum: 11 u[IU]/mL (ref 3–28)

## 2013-10-23 LAB — MAGNESIUM: Magnesium: 1.9 mg/dL (ref 1.5–2.5)

## 2013-11-10 ENCOUNTER — Other Ambulatory Visit: Payer: Self-pay | Admitting: Internal Medicine

## 2013-11-10 MED ORDER — ESOMEPRAZOLE MAGNESIUM 40 MG PO CPDR: 40.0000 mg | DELAYED_RELEASE_CAPSULE | Freq: Every day | ORAL | Status: DC

## 2013-11-22 ENCOUNTER — Other Ambulatory Visit: Payer: Self-pay | Admitting: Internal Medicine

## 2014-01-26 ENCOUNTER — Ambulatory Visit: Payer: Self-pay | Admitting: Emergency Medicine

## 2014-02-08 ENCOUNTER — Other Ambulatory Visit: Payer: Self-pay | Admitting: Internal Medicine

## 2014-02-11 ENCOUNTER — Encounter: Payer: Self-pay | Admitting: Physician Assistant

## 2014-02-11 ENCOUNTER — Ambulatory Visit (INDEPENDENT_AMBULATORY_CARE_PROVIDER_SITE_OTHER): Payer: BC Managed Care – PPO | Admitting: Physician Assistant

## 2014-02-11 VITALS — BP 132/80 | HR 64 | Temp 97.7°F | Resp 16 | Ht 72.5 in | Wt 223.0 lb

## 2014-02-11 DIAGNOSIS — E291 Testicular hypofunction: Secondary | ICD-10-CM

## 2014-02-11 DIAGNOSIS — E785 Hyperlipidemia, unspecified: Secondary | ICD-10-CM

## 2014-02-11 DIAGNOSIS — Z79899 Other long term (current) drug therapy: Secondary | ICD-10-CM

## 2014-02-11 DIAGNOSIS — I10 Essential (primary) hypertension: Secondary | ICD-10-CM

## 2014-02-11 DIAGNOSIS — R7303 Prediabetes: Secondary | ICD-10-CM

## 2014-02-11 DIAGNOSIS — R7309 Other abnormal glucose: Secondary | ICD-10-CM

## 2014-02-11 DIAGNOSIS — E559 Vitamin D deficiency, unspecified: Secondary | ICD-10-CM

## 2014-02-11 LAB — CBC WITH DIFFERENTIAL/PLATELET
Basophils Absolute: 0 10*3/uL (ref 0.0–0.1)
Basophils Relative: 0 % (ref 0–1)
Eosinophils Absolute: 0.4 10*3/uL (ref 0.0–0.7)
Eosinophils Relative: 4 % (ref 0–5)
HCT: 43.8 % (ref 39.0–52.0)
Hemoglobin: 15.4 g/dL (ref 13.0–17.0)
Lymphocytes Relative: 26 % (ref 12–46)
Lymphs Abs: 2.7 10*3/uL (ref 0.7–4.0)
MCH: 30.1 pg (ref 26.0–34.0)
MCHC: 35.2 g/dL (ref 30.0–36.0)
MCV: 85.5 fL (ref 78.0–100.0)
Monocytes Absolute: 0.5 10*3/uL (ref 0.1–1.0)
Monocytes Relative: 5 % (ref 3–12)
Neutro Abs: 6.8 10*3/uL (ref 1.7–7.7)
Neutrophils Relative %: 65 % (ref 43–77)
Platelets: 181 10*3/uL (ref 150–400)
RBC: 5.12 MIL/uL (ref 4.22–5.81)
RDW: 13.7 % (ref 11.5–15.5)
WBC: 10.5 10*3/uL (ref 4.0–10.5)

## 2014-02-11 LAB — TSH: TSH: 1.056 u[IU]/mL (ref 0.350–4.500)

## 2014-02-11 LAB — BASIC METABOLIC PANEL WITH GFR
BUN: 16 mg/dL (ref 6–23)
CO2: 24 mEq/L (ref 19–32)
Calcium: 9.1 mg/dL (ref 8.4–10.5)
Chloride: 105 mEq/L (ref 96–112)
Creat: 0.74 mg/dL (ref 0.50–1.35)
GFR, Est African American: >89 mL/min
GFR, Est Non African American: >89 mL/min
Glucose, Bld: 103 mg/dL — ABNORMAL HIGH (ref 70–99)
Potassium: 4.2 mEq/L (ref 3.5–5.3)
Sodium: 139 mEq/L (ref 135–145)

## 2014-02-11 LAB — HEPATIC FUNCTION PANEL
ALT: 27 U/L (ref 0–53)
AST: 24 U/L (ref 0–37)
Albumin: 4.4 g/dL (ref 3.5–5.2)
Alkaline Phosphatase: 67 U/L (ref 39–117)
Bilirubin, Direct: 0.1 mg/dL (ref 0.0–0.3)
Indirect Bilirubin: 0.2 mg/dL (ref 0.2–1.2)
Total Bilirubin: 0.3 mg/dL (ref 0.2–1.2)
Total Protein: 6.6 g/dL (ref 6.0–8.3)

## 2014-02-11 LAB — LIPID PANEL
Cholesterol: 158 mg/dL (ref 0–200)
HDL: 51 mg/dL (ref >39)
LDL Cholesterol: 71 mg/dL (ref 0–99)
Total CHOL/HDL Ratio: 3.1 Ratio
Triglycerides: 178 mg/dL — ABNORMAL HIGH (ref <150)
VLDL: 36 mg/dL (ref 0–40)

## 2014-02-11 LAB — MAGNESIUM: Magnesium: 1.9 mg/dL (ref 1.5–2.5)

## 2014-02-11 LAB — HEMOGLOBIN A1C
Hgb A1c MFr Bld: 5.9 % — ABNORMAL HIGH (ref <5.7)
Mean Plasma Glucose: 123 mg/dL — ABNORMAL HIGH (ref <117)

## 2014-02-11 NOTE — Patient Instructions (Signed)

## 2014-02-11 NOTE — Progress Notes (Signed)
Assessment and Plan:  Hypertension: Continue medication, monitor blood pressure at home. Continue DASH diet. Cholesterol: Continue diet and exercise. Check cholesterol.  Pre-diabetes-Continue diet and exercise. Check A1C Vitamin D Def- check level and continue medications.   Continue diet and meds as discussed. Further disposition pending results of labs.  HPI 53 y.o. male  presents for 3 month follow up with hypertension, hyperlipidemia, prediabetes and vitamin D. His blood pressure has been controlled at home, today their BP is BP: 132/80 mmHg He does workout, some walking and during hunting season he walks a lot in the woods. He denies chest pain, shortness of breath, dizziness.  He is on cholesterol medication, 80mg  1/2 pill a day, and denies myalgias. His cholesterol is at goal. The cholesterol last visit was:   Lab Results  Component Value Date   CHOL 153 10/22/2013   HDL 47 10/22/2013   LDLCALC 88 10/22/2013   TRIG 90 10/22/2013   CHOLHDL 3.3 10/22/2013   He has been working on diet and exercise for prediabetes, and denies paresthesia of the feet, polydipsia and polyuria. Last A1C in the office was:  Lab Results  Component Value Date   HGBA1C 5.9* 10/22/2013   Patient is on Vitamin D supplement.   Lab Results  Component Value Date   VD25OH 46 10/22/2013     He is has a history of testosterone deficiency but is not on replacement.    Current Medications:  Current Outpatient Prescriptions on File Prior to Visit  Medication Sig Dispense Refill  . aspirin 81 MG tablet Take 81 mg by mouth daily.      Marland Kitchen atorvastatin (LIPITOR) 80 MG tablet TAKE ONE TABLET BY MOUTH ONCE DAILY FOR CHOLESTEROL  30 tablet  3  . Cholecalciferol (VITAMIN D PO) Take 10,000 Int'l Units by mouth.       . esomeprazole (NEXIUM) 40 MG capsule Take 1 capsule (40 mg total) by mouth daily before breakfast. For acid indigestion and reflux  30 capsule  99  . montelukast (SINGULAIR) 10 MG tablet TAKE ONE TABLET BY  MOUTH ONCE DAILY FOR ALLERGIES AND ASTHMA  30 tablet  6  . Multiple Vitamins-Minerals (MULTIVITAMIN PO) Take by mouth.      . Omega-3 Fatty Acids (FISH OIL PO) Take 1,000 mg by mouth 3 (three) times daily.        No current facility-administered medications on file prior to visit.   Medical History:  Past Medical History  Diagnosis Date  . Hyperlipidemia   . GERD (gastroesophageal reflux disease)   . Hypertension   . Prediabetes   . Vitamin D deficiency   . Hypogonadism male   . Allergy    Allergies: No Known Allergies   Review of Systems: [X]  = complains of  [ ]  = denies  General: Fatigue [ ]  Fever [ ]  Chills [ ]  Weakness [ ]   Insomnia [ ]  Eyes: Redness [ ]  Blurred vision [ ]  Diplopia [ ]   ENT: Congestion [ ]  Sinus Pain [ ]  Post Nasal Drip [ ]  Sore Throat [ ]  Earache [ ]   Cardiac: Chest pain/pressure [ ]  SOB [ ]  Orthopnea [ ]   Palpitations [ ]   Paroxysmal nocturnal dyspnea[ ]  Claudication [ ]  Edema [ ]   Pulmonary: Cough [ ]  Wheezing[ ]   SOB [ ]   Snoring [ ]   GI: Nausea [ ]  Vomiting[ ]  Dysphagia[ ]  Heartburn[ ]  Abdominal pain [ ]  Constipation [ ] ; Diarrhea [ ] ; BRBPR [ ]  Melena[ ]  GU: Hematuria[ ]   Dysuria [ ]  Nocturia[ ]  Urgency [ ]   Hesitancy [ ]  Discharge [ ]  Neuro: Headaches[ ]  Vertigo[ ]  Paresthesias[ ]  Spasm [ ]  Speech changes [ ]  Incoordination [ ]   Ortho: Arthritis [ ]  Joint pain [ ]  Muscle pain [ ]  Joint swelling [ ]  Back Pain [ ]  Skin:  Rash [ ]   Pruritis [ ]  Change in skin lesion [ ]   Psych: Depression[ ]  Anxiety[ ]  Confusion [ ]  Memory loss [ ]   Heme/Lypmh: Bleeding [ ]  Bruising [ ]  Enlarged lymph nodes [ ]   Endocrine: Visual blurring [ ]  Paresthesia [ ]  Polyuria [ ]  Polydypsea [ ]    Heat/cold intolerance [ ]  Hypoglycemia [ ]   Family history- Review and unchanged Social history- Review and unchanged Physical Exam: BP 132/80  Pulse 64  Temp(Src) 97.7 F (36.5 C)  Resp 16  Ht 6' 0.5" (1.842 m)  Wt 223 lb (101.152 kg)  BMI 29.81 kg/m2 Wt Readings from Last 3  Encounters:  02/11/14 223 lb (101.152 kg)  10/22/13 218 lb 6.4 oz (99.066 kg)  07/20/13 222 lb (100.699 kg)   General Appearance: Well nourished, in no apparent distress. Eyes: PERRLA, EOMs, conjunctiva no swelling or erythema Sinuses: No Frontal/maxillary tenderness ENT/Mouth: Ext aud canals clear, TMs without erythema, bulging. No erythema, swelling, or exudate on post pharynx.  Tonsils not swollen or erythematous. Hearing normal.  Neck: Supple, thyroid normal.  Respiratory: Respiratory effort normal, BS equal bilaterally without rales, rhonchi, wheezing or stridor.  Cardio: RRR with no MRGs. Brisk peripheral pulses without edema.  Abdomen: Soft, + BS.  Non tender, no guarding, rebound, hernias, masses. Lymphatics: Non tender without lymphadenopathy.  Musculoskeletal: Full ROM, 5/5 strength, normal gait.  Skin: Warm, dry without rashes, lesions, ecchymosis.  Neuro: Cranial nerves intact. Normal muscle tone, no cerebellar symptoms. Sensation intact.  Psych: Awake and oriented X 3, normal affect, Insight and Judgment appropriate.    Vicie Mutters 9:01 AM

## 2014-02-12 LAB — VITAMIN D 25 HYDROXY (VIT D DEFICIENCY, FRACTURES): Vit D, 25-Hydroxy: 64 ng/mL (ref 30–89)

## 2014-02-12 LAB — INSULIN, FASTING: Insulin fasting, serum: 16 u[IU]/mL (ref 3–28)

## 2014-04-04 ENCOUNTER — Other Ambulatory Visit: Payer: Self-pay | Admitting: *Deleted

## 2014-04-04 MED ORDER — MONTELUKAST SODIUM 10 MG PO TABS: ORAL_TABLET | ORAL | Status: DC

## 2014-04-04 MED ORDER — ATORVASTATIN CALCIUM 80 MG PO TABS: ORAL_TABLET | ORAL | Status: DC

## 2014-04-04 MED ORDER — ESOMEPRAZOLE MAGNESIUM 40 MG PO CPDR: 40.0000 mg | DELAYED_RELEASE_CAPSULE | Freq: Every day | ORAL | Status: DC

## 2014-04-20 ENCOUNTER — Encounter: Payer: Self-pay | Admitting: Internal Medicine

## 2014-08-02 ENCOUNTER — Encounter: Payer: Self-pay | Admitting: Internal Medicine

## 2016-04-05 ENCOUNTER — Emergency Department (HOSPITAL_COMMUNITY): Payer: No Typology Code available for payment source

## 2016-04-05 ENCOUNTER — Inpatient Hospital Stay (HOSPITAL_COMMUNITY): Payer: No Typology Code available for payment source | Admitting: Anesthesiology

## 2016-04-05 ENCOUNTER — Inpatient Hospital Stay (HOSPITAL_COMMUNITY)
Admission: EM | Admit: 2016-04-05 | Discharge: 2016-04-30 | DRG: 955 | Disposition: A | Discharge status: Rehabilitation Facility | Payer: No Typology Code available for payment source | Attending: General Surgery | Admitting: General Surgery

## 2016-04-05 ENCOUNTER — Encounter (HOSPITAL_COMMUNITY): Admission: EM | Disposition: A | Discharge status: Rehabilitation Facility | Payer: Self-pay | Source: Home / Self Care

## 2016-04-05 ENCOUNTER — Encounter (HOSPITAL_COMMUNITY): Payer: Self-pay | Admitting: Radiology

## 2016-04-05 DIAGNOSIS — S92009A Unspecified fracture of unspecified calcaneus, initial encounter for closed fracture: Secondary | ICD-10-CM

## 2016-04-05 DIAGNOSIS — G8918 Other acute postprocedural pain: Secondary | ICD-10-CM

## 2016-04-05 DIAGNOSIS — S82831A Other fracture of upper and lower end of right fibula, initial encounter for closed fracture: Secondary | ICD-10-CM | POA: Diagnosis present

## 2016-04-05 DIAGNOSIS — S299XXA Unspecified injury of thorax, initial encounter: Secondary | ICD-10-CM

## 2016-04-05 DIAGNOSIS — J9601 Acute respiratory failure with hypoxia: Secondary | ICD-10-CM | POA: Diagnosis present

## 2016-04-05 DIAGNOSIS — R4182 Altered mental status, unspecified: Secondary | ICD-10-CM

## 2016-04-05 DIAGNOSIS — T148XXA Other injury of unspecified body region, initial encounter: Secondary | ICD-10-CM

## 2016-04-05 DIAGNOSIS — S069X3A Unspecified intracranial injury with loss of consciousness of 1 hour to 5 hours 59 minutes, initial encounter: Secondary | ICD-10-CM | POA: Diagnosis present

## 2016-04-05 DIAGNOSIS — Z419 Encounter for procedure for purposes other than remedying health state, unspecified: Secondary | ICD-10-CM

## 2016-04-05 DIAGNOSIS — R1310 Dysphagia, unspecified: Secondary | ICD-10-CM

## 2016-04-05 DIAGNOSIS — S14102A Unspecified injury at C2 level of cervical spinal cord, initial encounter: Secondary | ICD-10-CM | POA: Diagnosis present

## 2016-04-05 DIAGNOSIS — R402113 Coma scale, eyes open, never, at hospital admission: Secondary | ICD-10-CM | POA: Diagnosis present

## 2016-04-05 DIAGNOSIS — E876 Hypokalemia: Secondary | ICD-10-CM | POA: Diagnosis not present

## 2016-04-05 DIAGNOSIS — D72820 Lymphocytosis (symptomatic): Secondary | ICD-10-CM

## 2016-04-05 DIAGNOSIS — Z23 Encounter for immunization: Secondary | ICD-10-CM

## 2016-04-05 DIAGNOSIS — S92102A Unspecified fracture of left talus, initial encounter for closed fracture: Secondary | ICD-10-CM | POA: Diagnosis present

## 2016-04-05 DIAGNOSIS — R402222 Coma scale, best verbal response, incomprehensible words, at arrival to emergency department: Secondary | ICD-10-CM | POA: Diagnosis present

## 2016-04-05 DIAGNOSIS — S12200A Unspecified displaced fracture of third cervical vertebra, initial encounter for closed fracture: Secondary | ICD-10-CM | POA: Diagnosis present

## 2016-04-05 DIAGNOSIS — S12110A Anterior displaced Type II dens fracture, initial encounter for closed fracture: Secondary | ICD-10-CM | POA: Diagnosis present

## 2016-04-05 DIAGNOSIS — R58 Hemorrhage, not elsewhere classified: Secondary | ICD-10-CM

## 2016-04-05 DIAGNOSIS — J9811 Atelectasis: Secondary | ICD-10-CM | POA: Diagnosis present

## 2016-04-05 DIAGNOSIS — S27329A Contusion of lung, unspecified, initial encounter: Secondary | ICD-10-CM | POA: Diagnosis present

## 2016-04-05 DIAGNOSIS — S143XXA Injury of brachial plexus, initial encounter: Secondary | ICD-10-CM | POA: Diagnosis present

## 2016-04-05 DIAGNOSIS — S2243XA Multiple fractures of ribs, bilateral, initial encounter for closed fracture: Secondary | ICD-10-CM | POA: Diagnosis present

## 2016-04-05 DIAGNOSIS — S065X0A Traumatic subdural hemorrhage without loss of consciousness, initial encounter: Secondary | ICD-10-CM | POA: Diagnosis present

## 2016-04-05 DIAGNOSIS — S92101B Unspecified fracture of right talus, initial encounter for open fracture: Secondary | ICD-10-CM | POA: Diagnosis present

## 2016-04-05 DIAGNOSIS — S92902B Unspecified fracture of left foot, initial encounter for open fracture: Secondary | ICD-10-CM | POA: Diagnosis present

## 2016-04-05 DIAGNOSIS — S064X9A Epidural hemorrhage with loss of consciousness of unspecified duration, initial encounter: Secondary | ICD-10-CM | POA: Diagnosis present

## 2016-04-05 DIAGNOSIS — S066X0A Traumatic subarachnoid hemorrhage without loss of consciousness, initial encounter: Secondary | ICD-10-CM | POA: Diagnosis present

## 2016-04-05 DIAGNOSIS — Z833 Family history of diabetes mellitus: Secondary | ICD-10-CM | POA: Diagnosis not present

## 2016-04-05 DIAGNOSIS — R402213 Coma scale, best verbal response, none, at hospital admission: Secondary | ICD-10-CM | POA: Diagnosis present

## 2016-04-05 DIAGNOSIS — F1021 Alcohol dependence, in remission: Secondary | ICD-10-CM

## 2016-04-05 DIAGNOSIS — S92002A Unspecified fracture of left calcaneus, initial encounter for closed fracture: Secondary | ICD-10-CM | POA: Diagnosis present

## 2016-04-05 DIAGNOSIS — S92901A Unspecified fracture of right foot, initial encounter for closed fracture: Secondary | ICD-10-CM | POA: Diagnosis present

## 2016-04-05 DIAGNOSIS — S27322A Contusion of lung, bilateral, initial encounter: Secondary | ICD-10-CM | POA: Diagnosis present

## 2016-04-05 DIAGNOSIS — S066XAA Traumatic subarachnoid hemorrhage with loss of consciousness status unknown, initial encounter: Secondary | ICD-10-CM

## 2016-04-05 DIAGNOSIS — F17210 Nicotine dependence, cigarettes, uncomplicated: Secondary | ICD-10-CM | POA: Diagnosis present

## 2016-04-05 DIAGNOSIS — T8501XA Breakdown (mechanical) of ventricular intracranial (communicating) shunt, initial encounter: Secondary | ICD-10-CM | POA: Diagnosis not present

## 2016-04-05 DIAGNOSIS — S14109S Unspecified injury at unspecified level of cervical spinal cord, sequela: Secondary | ICD-10-CM

## 2016-04-05 DIAGNOSIS — S92001B Unspecified fracture of right calcaneus, initial encounter for open fracture: Secondary | ICD-10-CM | POA: Diagnosis present

## 2016-04-05 DIAGNOSIS — S82401A Unspecified fracture of shaft of right fibula, initial encounter for closed fracture: Secondary | ICD-10-CM | POA: Diagnosis present

## 2016-04-05 DIAGNOSIS — R402313 Coma scale, best motor response, none, at hospital admission: Secondary | ICD-10-CM | POA: Diagnosis present

## 2016-04-05 DIAGNOSIS — S92252A Displaced fracture of navicular [scaphoid] of left foot, initial encounter for closed fracture: Secondary | ICD-10-CM | POA: Diagnosis present

## 2016-04-05 DIAGNOSIS — S0990XA Unspecified injury of head, initial encounter: Secondary | ICD-10-CM

## 2016-04-05 DIAGNOSIS — S15102A Unspecified injury of left vertebral artery, initial encounter: Secondary | ICD-10-CM | POA: Diagnosis present

## 2016-04-05 DIAGNOSIS — S066X9A Traumatic subarachnoid hemorrhage with loss of consciousness of unspecified duration, initial encounter: Secondary | ICD-10-CM

## 2016-04-05 DIAGNOSIS — S14109A Unspecified injury at unspecified level of cervical spinal cord, initial encounter: Secondary | ICD-10-CM

## 2016-04-05 DIAGNOSIS — J9690 Respiratory failure, unspecified, unspecified whether with hypoxia or hypercapnia: Secondary | ICD-10-CM

## 2016-04-05 DIAGNOSIS — Z4659 Encounter for fitting and adjustment of other gastrointestinal appliance and device: Secondary | ICD-10-CM

## 2016-04-05 DIAGNOSIS — R7303 Prediabetes: Secondary | ICD-10-CM | POA: Diagnosis present

## 2016-04-05 DIAGNOSIS — S12400A Unspecified displaced fracture of fifth cervical vertebra, initial encounter for closed fracture: Secondary | ICD-10-CM | POA: Diagnosis present

## 2016-04-05 DIAGNOSIS — S12100A Unspecified displaced fracture of second cervical vertebra, initial encounter for closed fracture: Secondary | ICD-10-CM | POA: Diagnosis present

## 2016-04-05 DIAGNOSIS — E870 Hyperosmolality and hypernatremia: Secondary | ICD-10-CM | POA: Diagnosis not present

## 2016-04-05 DIAGNOSIS — J9600 Acute respiratory failure, unspecified whether with hypoxia or hypercapnia: Secondary | ICD-10-CM

## 2016-04-05 DIAGNOSIS — S92109A Unspecified fracture of unspecified talus, initial encounter for closed fracture: Secondary | ICD-10-CM

## 2016-04-05 DIAGNOSIS — R739 Hyperglycemia, unspecified: Secondary | ICD-10-CM | POA: Diagnosis not present

## 2016-04-05 DIAGNOSIS — R609 Edema, unspecified: Secondary | ICD-10-CM

## 2016-04-05 DIAGNOSIS — R0682 Tachypnea, not elsewhere classified: Secondary | ICD-10-CM

## 2016-04-05 DIAGNOSIS — R21 Rash and other nonspecific skin eruption: Secondary | ICD-10-CM | POA: Diagnosis not present

## 2016-04-05 DIAGNOSIS — D62 Acute posthemorrhagic anemia: Secondary | ICD-10-CM | POA: Diagnosis not present

## 2016-04-05 DIAGNOSIS — E8770 Fluid overload, unspecified: Secondary | ICD-10-CM | POA: Diagnosis not present

## 2016-04-05 DIAGNOSIS — R402342 Coma scale, best motor response, flexion withdrawal, at arrival to emergency department: Secondary | ICD-10-CM | POA: Diagnosis present

## 2016-04-05 DIAGNOSIS — M4802 Spinal stenosis, cervical region: Secondary | ICD-10-CM | POA: Diagnosis present

## 2016-04-05 DIAGNOSIS — S12401A Unspecified nondisplaced fracture of fifth cervical vertebra, initial encounter for closed fracture: Secondary | ICD-10-CM | POA: Diagnosis present

## 2016-04-05 DIAGNOSIS — R000 Tachycardia, unspecified: Secondary | ICD-10-CM

## 2016-04-05 DIAGNOSIS — I10 Essential (primary) hypertension: Secondary | ICD-10-CM | POA: Diagnosis not present

## 2016-04-05 DIAGNOSIS — M25461 Effusion, right knee: Secondary | ICD-10-CM | POA: Diagnosis present

## 2016-04-05 DIAGNOSIS — R402122 Coma scale, eyes open, to pain, at arrival to emergency department: Secondary | ICD-10-CM | POA: Diagnosis present

## 2016-04-05 HISTORY — PX: TALUS RELEASE: SHX2479

## 2016-04-05 HISTORY — PX: I&D EXTREMITY: SHX5045

## 2016-04-05 HISTORY — PX: CAST APPLICATION: SHX380

## 2016-04-05 LAB — COMPREHENSIVE METABOLIC PANEL WITH GFR
ALT: 68 U/L — ABNORMAL HIGH (ref 17–63)
AST: 99 U/L — ABNORMAL HIGH (ref 15–41)
Albumin: 4.2 g/dL (ref 3.5–5.0)
Alkaline Phosphatase: 72 U/L (ref 38–126)
Anion gap: 13 (ref 5–15)
BUN: 14 mg/dL (ref 6–20)
CO2: 21 mmol/L — ABNORMAL LOW (ref 22–32)
Calcium: 9.2 mg/dL (ref 8.9–10.3)
Chloride: 102 mmol/L (ref 101–111)
Creatinine, Ser: 0.88 mg/dL (ref 0.61–1.24)
GFR calc Af Amer: >60 mL/min
GFR calc non Af Amer: >60 mL/min
Glucose, Bld: 135 mg/dL — ABNORMAL HIGH (ref 65–99)
Potassium: 4.6 mmol/L (ref 3.5–5.1)
Sodium: 136 mmol/L (ref 135–145)
Total Bilirubin: 1.1 mg/dL (ref 0.3–1.2)
Total Protein: 6.6 g/dL (ref 6.5–8.1)

## 2016-04-05 LAB — TRIGLYCERIDES: Triglycerides: 144 mg/dL (ref <150)

## 2016-04-05 LAB — PREPARE FRESH FROZEN PLASMA
Unit division: 0
Unit division: 0

## 2016-04-05 LAB — BLOOD GAS, ARTERIAL
Acid-base deficit: 2.8 mmol/L — ABNORMAL HIGH (ref 0.0–2.0)
Bicarbonate: 21.5 mmol/L (ref 20.0–28.0)
Drawn by: 362771
FIO2: 100
MECHVT: 500 mL
O2 Saturation: 99.4 %
PEEP: 5 cmH2O
Patient temperature: 98.6
RATE: 15 resp/min
pCO2 arterial: 37.4 mmHg (ref 32.0–48.0)
pH, Arterial: 7.378 (ref 7.350–7.450)
pO2, Arterial: 368 mmHg — ABNORMAL HIGH (ref 83.0–108.0)

## 2016-04-05 LAB — CBC
HCT: 48.1 % (ref 39.0–52.0)
HCT: 42.8 % (ref 39.0–52.0)
Hemoglobin: 16.2 g/dL (ref 13.0–17.0)
Hemoglobin: 14 g/dL (ref 13.0–17.0)
MCH: 30.9 pg (ref 26.0–34.0)
MCH: 30.1 pg (ref 26.0–34.0)
MCHC: 33.7 g/dL (ref 30.0–36.0)
MCHC: 32.7 g/dL (ref 30.0–36.0)
MCV: 91.8 fL (ref 78.0–100.0)
MCV: 92 fL (ref 78.0–100.0)
Platelets: 233 K/uL (ref 150–400)
Platelets: 183 10*3/uL (ref 150–400)
RBC: 5.24 MIL/uL (ref 4.22–5.81)
RBC: 4.65 MIL/uL (ref 4.22–5.81)
RDW: 13.7 % (ref 11.5–15.5)
RDW: 13.7 % (ref 11.5–15.5)
WBC: 16.7 K/uL — ABNORMAL HIGH (ref 4.0–10.5)
WBC: 24.9 10*3/uL — ABNORMAL HIGH (ref 4.0–10.5)

## 2016-04-05 LAB — I-STAT CHEM 8, ED
BUN: 16 mg/dL (ref 6–20)
Calcium, Ion: 1.18 mmol/L (ref 1.15–1.40)
Chloride: 103 mmol/L (ref 101–111)
Creatinine, Ser: 0.9 mg/dL (ref 0.61–1.24)
Glucose, Bld: 144 mg/dL — ABNORMAL HIGH (ref 65–99)
HCT: 47 % (ref 39.0–52.0)
Hemoglobin: 16 g/dL (ref 13.0–17.0)
Potassium: 3.6 mmol/L (ref 3.5–5.1)
Sodium: 140 mmol/L (ref 135–145)
TCO2: 24 mmol/L (ref 0–100)

## 2016-04-05 LAB — PROTIME-INR
INR: 1.02
Prothrombin Time: 13.4 seconds (ref 11.4–15.2)

## 2016-04-05 LAB — BASIC METABOLIC PANEL
Anion gap: 9 (ref 5–15)
BUN: 16 mg/dL (ref 6–20)
CO2: 22 mmol/L (ref 22–32)
Calcium: 8.4 mg/dL — ABNORMAL LOW (ref 8.9–10.3)
Chloride: 106 mmol/L (ref 101–111)
Creatinine, Ser: 0.94 mg/dL (ref 0.61–1.24)
GFR calc Af Amer: >60 mL/min (ref >60)
GFR calc non Af Amer: >60 mL/min (ref >60)
Glucose, Bld: 163 mg/dL — ABNORMAL HIGH (ref 65–99)
Potassium: 5.1 mmol/L (ref 3.5–5.1)
Sodium: 137 mmol/L (ref 135–145)

## 2016-04-05 LAB — URINALYSIS, ROUTINE W REFLEX MICROSCOPIC
Bilirubin Urine: NEGATIVE
Glucose, UA: NEGATIVE mg/dL
Ketones, ur: NEGATIVE mg/dL
Leukocytes, UA: NEGATIVE
Nitrite: NEGATIVE
Protein, ur: 100 mg/dL — AB
Specific Gravity, Urine: 1.01 (ref 1.005–1.030)
pH: 5.5 (ref 5.0–8.0)

## 2016-04-05 LAB — ABO/RH: ABO/RH(D): B POS

## 2016-04-05 LAB — URINE MICROSCOPIC-ADD ON: WBC, UA: NONE SEEN WBC/hpf (ref 0–5)

## 2016-04-05 LAB — ETHANOL: Alcohol, Ethyl (B): 13 mg/dL — ABNORMAL HIGH (ref <5)

## 2016-04-05 LAB — I-STAT CG4 LACTIC ACID, ED: Lactic Acid, Venous: 2.92 mmol/L (ref 0.5–1.9)

## 2016-04-05 SURGERY — IRRIGATION AND DEBRIDEMENT EXTREMITY
Anesthesia: General | Site: Ankle | Laterality: Right

## 2016-04-05 MED ORDER — SODIUM CHLORIDE 0.9 % IV BOLUS (SEPSIS)
1000.0000 mL | Freq: Once | INTRAVENOUS | Status: AC
  Administered 2016-04-05: 1000 mL via INTRAVENOUS

## 2016-04-05 MED ORDER — PROPOFOL 1000 MG/100ML IV EMUL
INTRAVENOUS | Status: AC
  Administered 2016-04-05: 15 ug/kg/min via INTRAVENOUS
  Filled 2016-04-05: qty 100

## 2016-04-05 MED ORDER — FENTANYL CITRATE (PF) 100 MCG/2ML IJ SOLN: 100.0000 ug | INTRAMUSCULAR | Status: DC | PRN

## 2016-04-05 MED ORDER — ETOMIDATE 2 MG/ML IV SOLN
INTRAVENOUS | Status: AC | PRN
  Administered 2016-04-05: 30 mg via INTRAVENOUS

## 2016-04-05 MED ORDER — FENTANYL CITRATE (PF) 100 MCG/2ML IJ SOLN
50.0000 ug | Freq: Once | INTRAMUSCULAR | Status: DC
  Filled 2016-04-05: qty 2

## 2016-04-05 MED ORDER — PANTOPRAZOLE SODIUM 40 MG PO TBEC
40.0000 mg | DELAYED_RELEASE_TABLET | Freq: Two times a day (BID) | ORAL | Status: DC
  Administered 2016-04-12: 40 mg via ORAL
  Filled 2016-04-05 (×3): qty 1

## 2016-04-05 MED ORDER — FENTANYL CITRATE (PF) 100 MCG/2ML IJ SOLN
INTRAMUSCULAR | Status: AC
  Filled 2016-04-05: qty 2

## 2016-04-05 MED ORDER — FENTANYL BOLUS VIA INFUSION
50.0000 ug | INTRAVENOUS | Status: DC | PRN
  Administered 2016-04-12: 50 ug via INTRAVENOUS
  Filled 2016-04-05: qty 50

## 2016-04-05 MED ORDER — SODIUM CHLORIDE 0.9 % IV SOLN
INTRAVENOUS | Status: DC
  Administered 2016-04-06: 125 mL via INTRAVENOUS
  Administered 2016-04-06 – 2016-04-08 (×3): 1000 mL via INTRAVENOUS
  Administered 2016-04-08 – 2016-04-19 (×9): via INTRAVENOUS

## 2016-04-05 MED ORDER — PROPOFOL 1000 MG/100ML IV EMUL
0.0000 ug/kg/min | INTRAVENOUS | Status: DC
  Administered 2016-04-05: 20 ug/kg/min via INTRAVENOUS
  Administered 2016-04-05: 80 ug/kg/min via INTRAVENOUS

## 2016-04-05 MED ORDER — SODIUM CHLORIDE 0.9 % IV SOLN
25.0000 ug/h | INTRAVENOUS | Status: DC
  Administered 2016-04-05: 50 ug/h via INTRAVENOUS
  Filled 2016-04-05: qty 50

## 2016-04-05 MED ORDER — CHLORHEXIDINE GLUCONATE 0.12% ORAL RINSE (MEDLINE KIT)
15.0000 mL | Freq: Two times a day (BID) | OROMUCOSAL | Status: DC
  Administered 2016-04-05 – 2016-04-30 (×47): 15 mL via OROMUCOSAL

## 2016-04-05 MED ORDER — FENTANYL BOLUS VIA INFUSION
50.0000 ug | INTRAVENOUS | Status: DC | PRN
  Filled 2016-04-05: qty 50

## 2016-04-05 MED ORDER — PROPOFOL 1000 MG/100ML IV EMUL
0.0000 ug/kg/min | INTRAVENOUS | Status: DC
  Administered 2016-04-06: 30 ug/kg/min via INTRAVENOUS
  Administered 2016-04-06: 25 ug/kg/min via INTRAVENOUS
  Administered 2016-04-06: 50 ug/kg/min via INTRAVENOUS
  Administered 2016-04-07 – 2016-04-09 (×7): 20 ug/kg/min via INTRAVENOUS
  Administered 2016-04-09 (×2): 50 ug/kg/min via INTRAVENOUS
  Administered 2016-04-10: 20 ug/kg/min via INTRAVENOUS
  Filled 2016-04-05 (×14): qty 100

## 2016-04-05 MED ORDER — DOCUSATE SODIUM 50 MG/5ML PO LIQD
100.0000 mg | Freq: Two times a day (BID) | ORAL | Status: DC | PRN
  Administered 2016-04-26 – 2016-04-30 (×4): 100 mg
  Filled 2016-04-05 (×5): qty 10

## 2016-04-05 MED ORDER — PROPOFOL 1000 MG/100ML IV EMUL
0.0000 ug/kg/min | INTRAVENOUS | Status: DC
  Administered 2016-04-05 (×3): 15 ug/kg/min via INTRAVENOUS

## 2016-04-05 MED ORDER — FAMOTIDINE IN NACL 20-0.9 MG/50ML-% IV SOLN
20.0000 mg | Freq: Two times a day (BID) | INTRAVENOUS | Status: DC
  Administered 2016-04-06 – 2016-04-14 (×16): 20 mg via INTRAVENOUS
  Filled 2016-04-05 (×17): qty 50

## 2016-04-05 MED ORDER — FENTANYL CITRATE (PF) 100 MCG/2ML IJ SOLN
INTRAMUSCULAR | Status: AC
  Filled 2016-04-05: qty 4

## 2016-04-05 MED ORDER — PROPOFOL 1000 MG/100ML IV EMUL
INTRAVENOUS | Status: AC
  Filled 2016-04-05: qty 100

## 2016-04-05 MED ORDER — FENTANYL CITRATE (PF) 100 MCG/2ML IJ SOLN
INTRAMUSCULAR | Status: AC | PRN
  Administered 2016-04-05: 50 ug via INTRAVENOUS
  Administered 2016-04-05: 100 ug via INTRAVENOUS
  Administered 2016-04-05 (×2): 50 ug via INTRAVENOUS

## 2016-04-05 MED ORDER — MIDAZOLAM HCL 2 MG/2ML IJ SOLN
INTRAMUSCULAR | Status: AC
  Filled 2016-04-05: qty 2

## 2016-04-05 MED ORDER — IOPAMIDOL (ISOVUE-300) INJECTION 61%
100.0000 mL | Freq: Once | INTRAVENOUS | Status: AC | PRN
  Administered 2016-04-05: 100 mL via INTRAVENOUS

## 2016-04-05 MED ORDER — SODIUM CHLORIDE 0.9 % IV SOLN
25.0000 ug/h | INTRAVENOUS | Status: DC
  Administered 2016-04-06: 200 ug/h via INTRAVENOUS
  Administered 2016-04-08 (×2): 150 ug/h via INTRAVENOUS
  Administered 2016-04-10: 200 ug/h via INTRAVENOUS
  Filled 2016-04-05 (×6): qty 50

## 2016-04-05 MED ORDER — PROPOFOL 1000 MG/100ML IV EMUL
INTRAVENOUS | Status: AC
  Administered 2016-04-05: 20 ug/kg/min via INTRAVENOUS
  Filled 2016-04-05: qty 100

## 2016-04-05 MED ORDER — ORAL CARE MOUTH RINSE
15.0000 mL | Freq: Four times a day (QID) | OROMUCOSAL | Status: DC
  Administered 2016-04-06 – 2016-04-30 (×95): 15 mL via OROMUCOSAL

## 2016-04-05 MED ORDER — CEFAZOLIN SODIUM-DEXTROSE 2-4 GM/100ML-% IV SOLN
INTRAVENOUS | Status: AC
Start: 2016-04-05 — End: 2016-04-05
  Administered 2016-04-05: 2000 mg
  Filled 2016-04-05: qty 100

## 2016-04-05 MED ORDER — SUCCINYLCHOLINE CHLORIDE 20 MG/ML IJ SOLN
INTRAMUSCULAR | Status: AC | PRN
Start: 2016-04-05 — End: 2016-04-05
  Administered 2016-04-05: 120 mg via INTRAVENOUS

## 2016-04-05 MED ORDER — TETANUS-DIPHTH-ACELL PERTUSSIS 5-2.5-18.5 LF-MCG/0.5 IM SUSP
INTRAMUSCULAR | Status: AC
  Administered 2016-04-05: 0.5 mL via INTRAMUSCULAR
  Filled 2016-04-05: qty 0.5

## 2016-04-05 SURGICAL SUPPLY — 58 items
BANDAGE ACE 4X5 VEL STRL LF (GAUZE/BANDAGES/DRESSINGS) IMPLANT
BANDAGE ACE 6X5 VEL STRL LF (GAUZE/BANDAGES/DRESSINGS) ×18 IMPLANT
BANDAGE ELASTIC 3 VELCRO ST LF (GAUZE/BANDAGES/DRESSINGS) ×6 IMPLANT
BLADE SURG 10 STRL SS (BLADE) ×6 IMPLANT
BNDG COHESIVE 4X5 TAN STRL (GAUZE/BANDAGES/DRESSINGS) ×6 IMPLANT
BNDG COHESIVE 6X5 TAN STRL LF (GAUZE/BANDAGES/DRESSINGS) ×12 IMPLANT
BNDG GAUZE ELAST 4 BULKY (GAUZE/BANDAGES/DRESSINGS) ×6 IMPLANT
COVER SURGICAL LIGHT HANDLE (MISCELLANEOUS) ×6 IMPLANT
DRAPE C-ARMOR (DRAPES) ×6 IMPLANT
DRAPE ORTHO SPLIT 77X108 STRL (DRAPES) ×4
DRAPE SURG 17X23 STRL (DRAPES) ×6 IMPLANT
DRAPE SURG ORHT 6 SPLT 77X108 (DRAPES) ×8 IMPLANT
DRAPE U-SHAPE 47X51 STRL (DRAPES) ×6 IMPLANT
DRSG EMULSION OIL 3X3 NADH (GAUZE/BANDAGES/DRESSINGS) IMPLANT
DRSG PAD ABDOMINAL 8X10 ST (GAUZE/BANDAGES/DRESSINGS) ×12 IMPLANT
DURAPREP 26ML APPLICATOR (WOUND CARE) ×6 IMPLANT
ELECT REM PT RETURN 9FT ADLT (ELECTROSURGICAL) ×6
ELECTRODE REM PT RTRN 9FT ADLT (ELECTROSURGICAL) ×4 IMPLANT
GAUZE SPONGE 4X4 12PLY STRL (GAUZE/BANDAGES/DRESSINGS) ×18 IMPLANT
GAUZE XEROFORM 1X8 LF (GAUZE/BANDAGES/DRESSINGS) ×6 IMPLANT
GAUZE XEROFORM 5X9 LF (GAUZE/BANDAGES/DRESSINGS) ×6 IMPLANT
GLOVE BIO SURGEON STRL SZ8 (GLOVE) ×18 IMPLANT
GLOVE BIOGEL PI IND STRL 7.0 (GLOVE) ×4 IMPLANT
GLOVE BIOGEL PI IND STRL 8 (GLOVE) ×12 IMPLANT
GLOVE BIOGEL PI INDICATOR 7.0 (GLOVE) ×2
GLOVE BIOGEL PI INDICATOR 8 (GLOVE) ×6
GLOVE ECLIPSE 6.5 STRL STRAW (GLOVE) ×6 IMPLANT
GLOVE ORTHO TXT STRL SZ7.5 (GLOVE) ×6 IMPLANT
GLOVE SURG SS PI 6.5 STRL IVOR (GLOVE) ×6 IMPLANT
GOWN STRL REUS W/ TWL LRG LVL3 (GOWN DISPOSABLE) ×4 IMPLANT
GOWN STRL REUS W/ TWL XL LVL3 (GOWN DISPOSABLE) ×16 IMPLANT
GOWN STRL REUS W/TWL LRG LVL3 (GOWN DISPOSABLE) ×2
GOWN STRL REUS W/TWL XL LVL3 (GOWN DISPOSABLE) ×8
HANDPIECE INTERPULSE COAX TIP (DISPOSABLE) ×2
KIT BASIN OR (CUSTOM PROCEDURE TRAY) ×6 IMPLANT
KIT ROOM TURNOVER OR (KITS) ×6 IMPLANT
MANIFOLD NEPTUNE II (INSTRUMENTS) ×6 IMPLANT
NS IRRIG 1000ML POUR BTL (IV SOLUTION) ×6 IMPLANT
PACK ORTHO EXTREMITY (CUSTOM PROCEDURE TRAY) ×6 IMPLANT
PAD ABD 8X10 STRL (GAUZE/BANDAGES/DRESSINGS) ×6 IMPLANT
PAD ARMBOARD 7.5X6 YLW CONV (MISCELLANEOUS) ×12 IMPLANT
PAD CAST 4YDX4 CTTN HI CHSV (CAST SUPPLIES) ×20 IMPLANT
PADDING CAST ABS 4INX4YD NS (CAST SUPPLIES) ×4
PADDING CAST ABS COTTON 4X4 ST (CAST SUPPLIES) ×8 IMPLANT
PADDING CAST COTTON 4X4 STRL (CAST SUPPLIES) ×10
PADDING CAST COTTON 6X4 STRL (CAST SUPPLIES) ×18 IMPLANT
SET HNDPC FAN SPRY TIP SCT (DISPOSABLE) ×4 IMPLANT
SPONGE LAP 18X18 X RAY DECT (DISPOSABLE) ×12 IMPLANT
STOCKINETTE IMPERVIOUS 9X36 MD (GAUZE/BANDAGES/DRESSINGS) ×6 IMPLANT
SUT ETHILON 2 0 FS 18 (SUTURE) ×6 IMPLANT
SUT ETHILON 2 0 PSLX (SUTURE) ×18 IMPLANT
TOWEL OR 17X24 6PK STRL BLUE (TOWEL DISPOSABLE) ×6 IMPLANT
TOWEL OR 17X26 10 PK STRL BLUE (TOWEL DISPOSABLE) ×6 IMPLANT
TUBE CONNECTING 12'X1/4 (SUCTIONS) ×1
TUBE CONNECTING 12X1/4 (SUCTIONS) ×5 IMPLANT
UNDERPAD 30X30 (UNDERPADS AND DIAPERS) ×6 IMPLANT
WATER STERILE IRR 1000ML POUR (IV SOLUTION) ×6 IMPLANT
YANKAUER SUCT BULB TIP NO VENT (SUCTIONS) ×6 IMPLANT
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
## 2016-04-05 NOTE — ED Notes (Signed)
RN accompanying pt to CT.

## 2016-04-05 NOTE — ED Notes (Signed)
Dr. Jenkins at bedside. 

## 2016-04-05 NOTE — ED Notes (Signed)
CT delayed d/t IV displacement from pt's LAC.  Contrast did not go in at first d/t not being hooked up to IV prior to displacement.  Contrast was found on left side of bed sheets.  Pt appeared very restless d/t propofol allegedly being stopped by CT tech.  Carmelina Paddock, RN present.  Lawanna Kobus, EMTs present.

## 2016-04-05 NOTE — ED Notes (Signed)
Dr. Arnoldo Morale preparing to place ventriculostomy to minimize pressure. Pt wife signed informed consent, which is at bedside at this time.
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
## 2016-04-05 NOTE — ED Notes (Signed)
Pt back in room.

## 2016-04-05 NOTE — Anesthesia Preprocedure Evaluation (Addendum)
Anesthesia Evaluation  Patient identified by MRN, date of birth, ID band Patient unresponsive    Reviewed: Allergy & Precautions, NPO status , Unable to perform ROS - Chart review onlyPreop documentation limited or incomplete due to emergent nature of procedure.  Airway Mallampati: Intubated       Dental  (+) Dental Advisory Given   Pulmonary neg pulmonary ROS,     + decreased breath sounds      Cardiovascular negative cardio ROS   Rhythm:Regular Rate:Tachycardia     Neuro/Psych negative neurological ROS  negative psych ROS   GI/Hepatic Neg liver ROS, GERD  Medicated,  Endo/Other  negative endocrine ROS  Renal/GU negative Renal ROS  negative genitourinary   Musculoskeletal negative musculoskeletal ROS (+)   Abdominal   Peds negative pediatric ROS (+)  Hematology negative hematology ROS (+)   Anesthesia Other Findings   Reproductive/Obstetrics negative OB ROS                            Anesthesia Physical Anesthesia Plan  ASA: III and emergent  Anesthesia Plan: General   Post-op Pain Management:    Induction: Inhalational  Airway Management Planned: Oral ETT  Additional Equipment:   Intra-op Plan:   Post-operative Plan: Post-operative intubation/ventilation  Informed Consent: I have reviewed the patients History and Physical, chart, labs and discussed the procedure including the risks, benefits and alternatives for the proposed anesthesia with the patient or authorized representative who has indicated his/her understanding and acceptance.   History available from chart only and Only emergency history available  Plan Discussed with: CRNA  Anesthesia Plan Comments:         Anesthesia Quick Evaluation
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
## 2016-04-05 NOTE — ED Notes (Signed)
Notified Tsuei, MD regarding pt's BP drop to [id number] systolic. Gave verbal order for NS bolus and repeat CBC and BMP.
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
## 2016-04-05 NOTE — ED Notes (Signed)
Patient in CT

## 2016-04-05 NOTE — ED Notes (Signed)
IVC drain placed, 10cm water.

## 2016-04-05 NOTE — ED Notes (Signed)
Paused Propofol drip per Dr. Arnoldo Morale to determine neuro status at this time.

## 2016-04-06 ENCOUNTER — Inpatient Hospital Stay (HOSPITAL_COMMUNITY): Payer: No Typology Code available for payment source

## 2016-04-06 LAB — BLOOD GAS, ARTERIAL
Acid-base deficit: 1.4 mmol/L (ref 0.0–2.0)
Acid-base deficit: 2.9 mmol/L — ABNORMAL HIGH (ref 0.0–2.0)
Bicarbonate: 24.3 mmol/L (ref 20.0–28.0)
Bicarbonate: 22.3 mmol/L (ref 20.0–28.0)
Drawn by: 280981
Drawn by: 225631
FIO2: 40
FIO2: 40
MECHVT: 600 mL
MECHVT: 600 mL
O2 Saturation: 95.5 %
O2 Saturation: 97.4 %
PEEP: 5 cmH2O
PEEP: 5 cmH2O
Patient temperature: 98.6
Patient temperature: 100.4
RATE: 14 resp/min
RATE: 20 resp/min
pCO2 arterial: 52.2 mmHg — ABNORMAL HIGH (ref 32.0–48.0)
pCO2 arterial: 46.4 mmHg (ref 32.0–48.0)
pH, Arterial: 7.29 — ABNORMAL LOW (ref 7.350–7.450)
pH, Arterial: 7.308 — ABNORMAL LOW (ref 7.350–7.450)
pO2, Arterial: 83.9 mmHg (ref 83.0–108.0)
pO2, Arterial: 110 mmHg — ABNORMAL HIGH (ref 83.0–108.0)

## 2016-04-06 LAB — COMPREHENSIVE METABOLIC PANEL
ALT: 52 U/L (ref 17–63)
AST: 86 U/L — ABNORMAL HIGH (ref 15–41)
Albumin: 3 g/dL — ABNORMAL LOW (ref 3.5–5.0)
Alkaline Phosphatase: 50 U/L (ref 38–126)
Anion gap: 8 (ref 5–15)
BUN: 16 mg/dL (ref 6–20)
CO2: 21 mmol/L — ABNORMAL LOW (ref 22–32)
Calcium: 7.6 mg/dL — ABNORMAL LOW (ref 8.9–10.3)
Chloride: 105 mmol/L (ref 101–111)
Creatinine, Ser: 1 mg/dL (ref 0.61–1.24)
GFR calc Af Amer: >60 mL/min (ref >60)
GFR calc non Af Amer: >60 mL/min (ref >60)
Glucose, Bld: 134 mg/dL — ABNORMAL HIGH (ref 65–99)
Potassium: 4.5 mmol/L (ref 3.5–5.1)
Sodium: 134 mmol/L — ABNORMAL LOW (ref 135–145)
Total Bilirubin: 0.7 mg/dL (ref 0.3–1.2)
Total Protein: 5 g/dL — ABNORMAL LOW (ref 6.5–8.1)

## 2016-04-06 LAB — PROTIME-INR
INR: 1.17
Prothrombin Time: 15 seconds (ref 11.4–15.2)

## 2016-04-06 LAB — CBC
HCT: 34.5 % — ABNORMAL LOW (ref 39.0–52.0)
Hemoglobin: 11.2 g/dL — ABNORMAL LOW (ref 13.0–17.0)
MCH: 29.8 pg (ref 26.0–34.0)
MCHC: 32.5 g/dL (ref 30.0–36.0)
MCV: 91.8 fL (ref 78.0–100.0)
Platelets: 142 10*3/uL — ABNORMAL LOW (ref 150–400)
RBC: 3.76 MIL/uL — ABNORMAL LOW (ref 4.22–5.81)
RDW: 13.9 % (ref 11.5–15.5)
WBC: 12.6 10*3/uL — ABNORMAL HIGH (ref 4.0–10.5)

## 2016-04-06 LAB — CDS SEROLOGY

## 2016-04-06 LAB — TRIGLYCERIDES: Triglycerides: 188 mg/dL — ABNORMAL HIGH (ref <150)

## 2016-04-06 LAB — MRSA PCR SCREENING: MRSA by PCR: NEGATIVE

## 2016-04-06 MED ORDER — IPRATROPIUM-ALBUTEROL 0.5-2.5 (3) MG/3ML IN SOLN
RESPIRATORY_TRACT | Status: AC
  Filled 2016-04-06: qty 3

## 2016-04-06 MED ORDER — ROCURONIUM BROMIDE 100 MG/10ML IV SOLN
INTRAVENOUS | Status: DC | PRN
  Administered 2016-04-05: 50 mg via INTRAVENOUS
  Administered 2016-04-06 (×3): 20 mg via INTRAVENOUS

## 2016-04-06 MED ORDER — 0.9 % SODIUM CHLORIDE (POUR BTL) OPTIME
TOPICAL | Status: DC | PRN
  Administered 2016-04-06: 1000 mL

## 2016-04-06 MED ORDER — ARTIFICIAL TEARS OP OINT
TOPICAL_OINTMENT | OPHTHALMIC | Status: DC | PRN
  Administered 2016-04-05: 1 via OPHTHALMIC

## 2016-04-06 MED ORDER — IPRATROPIUM-ALBUTEROL 0.5-2.5 (3) MG/3ML IN SOLN: 3.0000 mL | Freq: Four times a day (QID) | RESPIRATORY_TRACT | Status: DC

## 2016-04-06 MED ORDER — ROCURONIUM BROMIDE 10 MG/ML (PF) SYRINGE
PREFILLED_SYRINGE | INTRAVENOUS | Status: AC
  Filled 2016-04-06: qty 10

## 2016-04-06 MED ORDER — PNEUMOCOCCAL VAC POLYVALENT 25 MCG/0.5ML IJ INJ
0.5000 mL | INJECTION | INTRAMUSCULAR | Status: AC
  Administered 2016-04-07: 0.5 mL via INTRAMUSCULAR
  Filled 2016-04-06: qty 0.5

## 2016-04-06 MED ORDER — FENTANYL CITRATE (PF) 100 MCG/2ML IJ SOLN
INTRAMUSCULAR | Status: DC | PRN
  Administered 2016-04-05 – 2016-04-06 (×3): 50 ug via INTRAVENOUS
  Administered 2016-04-06 (×3): 100 ug via INTRAVENOUS
  Administered 2016-04-06: 50 ug via INTRAVENOUS

## 2016-04-06 MED ORDER — SODIUM CHLORIDE 0.9% FLUSH
10.0000 mL | Freq: Two times a day (BID) | INTRAVENOUS | Status: DC
  Administered 2016-04-06: 30 mL
  Administered 2016-04-06 – 2016-04-07 (×2): 10 mL
  Administered 2016-04-07: 30 mL
  Administered 2016-04-08 – 2016-04-13 (×12): 10 mL
  Administered 2016-04-14: 30 mL
  Administered 2016-04-14 – 2016-04-17 (×7): 10 mL
  Administered 2016-04-18 (×2): 20 mL
  Administered 2016-04-19 – 2016-04-23 (×9): 10 mL
  Administered 2016-04-24: 30 mL
  Administered 2016-04-26 – 2016-04-27 (×3): 10 mL
  Administered 2016-04-27: 30 mL
  Administered 2016-04-28 – 2016-04-29 (×3): 10 mL
  Administered 2016-04-30: 30 mL

## 2016-04-06 MED ORDER — SODIUM CHLORIDE 0.9% FLUSH
10.0000 mL | INTRAVENOUS | Status: DC | PRN
  Administered 2016-04-06: 10 mL
  Administered 2016-04-26: 20 mL
  Administered 2016-04-27 (×2): 30 mL
  Administered 2016-04-28 (×2): 10 mL
  Administered 2016-04-29: 30 mL
  Filled 2016-04-06 (×7): qty 40

## 2016-04-06 MED ORDER — LACTATED RINGERS IV SOLN
INTRAVENOUS | Status: DC | PRN
  Administered 2016-04-05 – 2016-04-06 (×2): via INTRAVENOUS

## 2016-04-06 MED ORDER — INFLUENZA VAC SPLIT QUAD 0.5 ML IM SUSY
0.5000 mL | PREFILLED_SYRINGE | INTRAMUSCULAR | Status: AC
  Administered 2016-04-07: 0.5 mL via INTRAMUSCULAR
  Filled 2016-04-06: qty 0.5

## 2016-04-06 MED ORDER — PHENYLEPHRINE 40 MCG/ML (10ML) SYRINGE FOR IV PUSH (FOR BLOOD PRESSURE SUPPORT)
PREFILLED_SYRINGE | INTRAVENOUS | Status: AC
  Filled 2016-04-06: qty 20

## 2016-04-06 MED ORDER — IPRATROPIUM-ALBUTEROL 0.5-2.5 (3) MG/3ML IN SOLN
3.0000 mL | Freq: Four times a day (QID) | RESPIRATORY_TRACT | Status: DC
  Administered 2016-04-06 – 2016-04-16 (×37): 3 mL via RESPIRATORY_TRACT
  Filled 2016-04-06 (×35): qty 3

## 2016-04-06 MED ORDER — CEFAZOLIN SODIUM 1 G IJ SOLR
INTRAMUSCULAR | Status: AC
  Filled 2016-04-06: qty 20

## 2016-04-06 MED ORDER — FENTANYL CITRATE (PF) 100 MCG/2ML IJ SOLN
INTRAMUSCULAR | Status: AC
  Filled 2016-04-06: qty 2

## 2016-04-06 MED ORDER — MIDAZOLAM HCL 5 MG/5ML IJ SOLN
INTRAMUSCULAR | Status: DC | PRN
  Administered 2016-04-05: 2 mg via INTRAVENOUS

## 2016-04-06 MED ORDER — CEFAZOLIN IN D5W 1 GM/50ML IV SOLN
1.0000 g | Freq: Three times a day (TID) | INTRAVENOUS | Status: DC
  Administered 2016-04-06 – 2016-04-09 (×10): 1 g via INTRAVENOUS
  Filled 2016-04-06 (×13): qty 50

## 2016-04-06 MED ORDER — CEFAZOLIN SODIUM-DEXTROSE 2-3 GM-% IV SOLR
INTRAVENOUS | Status: DC | PRN
  Administered 2016-04-05: 2 g via INTRAVENOUS

## 2016-04-06 MED ORDER — FENTANYL CITRATE (PF) 100 MCG/2ML IJ SOLN
INTRAMUSCULAR | Status: AC
Start: 2016-04-06 — End: 2016-04-06
  Filled 2016-04-06: qty 2

## 2016-04-06 MED ORDER — SODIUM CHLORIDE 0.9 % IR SOLN
Status: DC | PRN
  Administered 2016-04-06: 3000 mL

## 2016-04-06 NOTE — Progress Notes (Signed)
Dr. Arnoldo Morale contacted concerning IVC not patent. Actions performed to stimulate drainage without success. Plan is to possibly irrigate tomorrow. Will continue to monitor.
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
## 2016-04-06 NOTE — Transfer of Care (Signed)
Immediate Anesthesia Transfer of Care Note  Patient: BURK RAGNO  Procedure(s) Performed: Procedure(s): IRRIGATION AND DEBRIDEMENT RIGHT ANKLE OPEN CALCANEUS TALUS FRACTURE (Right) SPLINT APPLICATION BILATERAL (Bilateral) OPEN REDUCTION TALUS AND DISLOCATION (Left)  Patient Location: NICU  Anesthesia Type:General  Level of Consciousness: sedated and Patient remains intubated per anesthesia plan  Airway & Oxygen Therapy: Patient remains intubated per anesthesia plan and Patient placed on Ventilator (see vital sign flow sheet for setting)  Post-op Assessment: Report given to RN and Post -op Vital signs reviewed and stable  Post vital signs: Reviewed and stable  Last Vitals:  Vitals:   04/05/16 2255 04/05/16 2300  BP: 105/73 102/70  Pulse: 84 83  Resp: 14 15  Temp:      Last Pain:  Vitals:   04/05/16 2206  TempSrc: Axillary         Complications: No apparent anesthesia complications
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
## 2016-04-06 NOTE — Progress Notes (Signed)
Peripherally Inserted Central Catheter/Midline Placement  The IV Nurse has discussed with the patient and/or persons authorized to consent for the patient, the purpose of this procedure and the potential benefits and risks involved with this procedure.  The benefits include less needle sticks, lab draws from the catheter, and the patient may be discharged home with the catheter. Risks include, but not limited to, infection, bleeding, blood clot (thrombus formation), and puncture of an artery; nerve damage and irregular heartbeat and possibility to perform a PICC exchange if needed/ordered by physician.  Alternatives to this procedure were also discussed.  Bard Power PICC patient education guide, fact sheet on infection prevention and patient information card has been provided to patient /or left at bedside.    PICC/Midline Placement Documentation  PICC Triple Lumen [id number] PICC Right Basilic 43 cm 0 cm (Active)  Indication for Insertion or Continuance of Line Vasoactive infusions;Poor Vasculature-patient has had multiple peripheral attempts or PIVs lasting less than 24 hours 04/06/2016 11:54 AM  Exposed Catheter (cm) 0 cm 04/06/2016 11:54 AM  Site Assessment Clean;Dry;Intact 04/06/2016 11:54 AM  Lumen #1 Status Flushed;Saline locked;Blood return noted 04/06/2016 11:54 AM  Lumen #2 Status Flushed;Saline locked;Blood return noted 04/06/2016 11:54 AM  Lumen #3 Status Flushed;Saline locked;Blood return noted 04/06/2016 11:54 AM  Dressing Type Transparent 04/06/2016 11:54 AM  Dressing Status Clean;Dry;Intact 04/06/2016 11:54 AM  Dressing Change Due 04/13/16 04/06/2016 11:54 AM       Gordan Payment 04/06/2016, 11:55 AM

## 2016-04-06 NOTE — Anesthesia Postprocedure Evaluation (Signed)
Anesthesia Post Note  Patient: [name]  Procedure(s) Performed: Procedure(s) (LRB): IRRIGATION AND DEBRIDEMENT RIGHT ANKLE OPEN CALCANEUS TALUS FRACTURE (Right) SPLINT APPLICATION BILATERAL (Bilateral) OPEN REDUCTION TALUS AND DISLOCATION (Left)  Patient location during evaluation: SICU Anesthesia Type: General Level of consciousness: sedated Pain management: pain level controlled Vital Signs Assessment: post-procedure vital signs reviewed and stable Respiratory status: patient remains intubated per anesthesia plan Cardiovascular status: stable Anesthetic complications: no    Last Vitals:  Vitals:   04/06/16 0530 04/06/16 0600  BP: 105/72 105/69  Pulse: 99 100  Resp: 14 14  Temp: 37.4 C 37.5 C    Last Pain:  Vitals:   04/05/16 2206  TempSrc: Axillary                 [name]
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
## 2016-04-06 NOTE — Progress Notes (Signed)
Follow up - Trauma Critical Care  Patient Details:    [name] is an 55 y.o. male.  Lines/tubes : Airway 7.5 mm (Active)  Secured at (cm) 25 cm 04/06/2016  7:54 AM  Measured From Lips 04/06/2016  7:54 AM  Secured Location Left 04/06/2016  7:54 AM  Secured By [employer] 04/06/2016  7:54 AM  Tube Holder Repositioned Yes 04/06/2016  7:54 AM  Cuff Pressure (cm H2O) 28 cm H2O 04/06/2016  3:32 AM  Site Condition Dry 04/06/2016  7:54 AM     Arterial Line 04/05/16 Right Pedal (Active)  Site Assessment Clean;Dry;Intact 04/06/2016  8:00 AM  Line Status Pulsatile blood flow 04/06/2016  8:00 AM  Art Line Waveform Appropriate;Square wave test performed 04/06/2016  8:00 AM  Art Line Interventions Zeroed and calibrated;Flushed per protocol;Connections checked and tightened 04/06/2016  8:00 AM  Color/Movement/Sensation Capillary refill less than 3 sec 04/06/2016  8:00 AM  Dressing Type Transparent 04/06/2016  8:00 AM  Dressing Status Clean;Dry;Intact 04/06/2016  8:00 AM  Dressing Change Due 04/12/16 04/06/2016  8:00 AM     NG/OG Tube Orogastric 16 Fr. Center mouth Xray (Active)  Site Assessment Clean;Dry;Intact 04/06/2016  8:00 AM  Ongoing Placement Verification Auscultation 04/06/2016  8:00 AM  Status Clamped 04/06/2016  8:00 AM  Drainage Appearance Clear;Mucus;Yellow 04/06/2016  8:00 AM     Urethral Catheter Darla Anastasia Fiedler RN Non-latex;Temperature probe 16 Fr. (Active)  Indication for Insertion or Continuance of Catheter Unstable critical patients (first 24-48 hours);Peri-operative use for selective surgical procedure 04/06/2016  2:05 AM  Site Assessment Clean;Intact 04/06/2016  2:05 AM  Catheter Maintenance Bag below level of bladder;Catheter secured;Drainage bag/tubing not touching floor;Insertion date on drainage bag;No dependent loops;Seal intact;Bag emptied prior to transport 04/06/2016  2:05 AM  Collection Container Standard drainage bag 04/06/2016  2:05 AM  Securement Method Securing device  (Describe) 04/06/2016  2:05 AM  Urinary Catheter Interventions Unclamped 04/06/2016  2:05 AM  Output (mL) 160 mL 04/06/2016  4:52 AM     ICP/Ventriculostomy Ventricular drainage catheter Right Parietal region (Active)  Drain Status Open 04/06/2016  6:00 AM  Level 10 cm 04/06/2016  6:00 AM  Status Open to continuous drainage 04/06/2016  6:00 AM  CSF Color Clear 04/06/2016  6:00 AM  Site Assessment Clean;Dry 04/06/2016  6:00 AM  Dressing Status Clean;Dry;Intact 04/06/2016  6:00 AM  Dressing Change Due 04/12/16 04/06/2016  2:05 AM  Output (mL) 0 mL 04/06/2016  6:00 AM    Microbiology/Sepsis markers: Results for orders placed or performed during the hospital encounter of 04/05/16  MRSA PCR Screening     Status: None   Collection Time: 04/05/16 10:19 PM  Result Value Ref Range Status   MRSA by PCR NEGATIVE NEGATIVE Final    Comment:        The GeneXpert MRSA Assay (FDA approved for NASAL specimens only), is one component of a comprehensive MRSA colonization surveillance program. It is not intended to diagnose MRSA infection nor to guide or monitor treatment for MRSA infections.     Anti-infectives:  Anti-infectives    Start     Dose/Rate Route Frequency Ordered Stop   04/06/16 0600  ceFAZolin (ANCEF) IVPB 1 g/50 mL premix     1 g 100 mL/hr over 30 Minutes Intravenous Every 8 hours 04/06/16 0206     04/05/16 1729  ceFAZolin (ANCEF) 2-4 GM/100ML-% IVPB    Comments:  Rumbarger, Rachel   : cabinet override      04/05/16 1729 04/05/16 1937  Best Practice/Protocols:  VTE Prophylaxis: Mechanical Continous Sedation  Consults: Treatment Team:  Mcarthur Rossetti, MD Newman Pies, MD   Subjective:    Overnight Issues:   Objective:  Vital signs for last 24 hours: Temp:  [98.1 F (36.7 C)-99.9 F (37.7 C)] 99.9 F (37.7 C) (09/30 0730) Pulse Rate:  [81-110] 101 (09/30 0754) Resp:  [12-26] 14 (09/30 0754) BP: (69-160)/(46-110) 117/54 (09/30 0754) SpO2:  [96 %-100 %]  100 % (09/30 0754) Arterial Line BP: (107-140)/(49-57) 116/55 (09/30 0730) FiO2 (%):  [40 %-100 %] (P) 40 % (09/30 0900) Weight:  [99.7 kg (219 lb 12.8 oz)-100.2 kg (221 lb)] 99.7 kg (219 lb 12.8 oz) (09/29 2206)  Hemodynamic parameters for last 24 hours:    Intake/Output from previous day: 09/29 0701 - 09/30 0700 In: 2439.5 [I.V.:2389.5; IV Piggyback:50] Out: [id number] [Urine:710; Blood:150]  Intake/Output this shift: No intake/output data recorded.  Vent settings for last 24 hours: Vent Mode: PRVC FiO2 (%):  [40 %-100 %] (P) 40 % Set Rate:  [14 bmp] 14 bmp Vt Set:  [600 mL] 600 mL PEEP:  [5 cmH20] 5 cmH20 Plateau Pressure:  [9 [id number] cmH20] 17 cmH20  Physical Exam:  General: on vent Neuro: PERL, localizes with RUE, some movement LUE, BLE to pain HEENT/Neck: ETT and periorbital contusions Resp: clear to auscultation bilaterally CVS: RRR GI: soft, NT, ND Extremities: ortho dressings BLE  Correction no periorb cont  Results for orders placed or performed during the hospital encounter of 04/05/16 (from the past 24 hour(s))  Prepare fresh frozen plasma     Status: None   Collection Time: 04/05/16  5:14 PM  Result Value Ref Range   Unit Number [id number]    Blood Component Type THWPLS APHR1    Unit division 00    Status of Unit REL FROM [facility]    Unit tag comment VERBAL ORDERS PER DR MESNER    Transfusion Status OK TO TRANSFUSE    Unit Number [id number]    Blood Component Type LIQ PLASMA    Unit division 00    Status of Unit REL FROM [facility]    Unit tag comment VERBAL ORDERS PER DR MESNER    Transfusion Status OK TO TRANSFUSE   Type and screen     Status: None   Collection Time: 04/05/16  5:35 PM  Result Value Ref Range   ABO/RH(D) B POS    Antibody Screen NEG    Sample Expiration 04/08/2016    Unit Number [id number]    Blood Component Type RED CELLS,LR    Unit division 00    Status of Unit REL FROM [facility]    Unit tag comment VERBAL ORDERS PER DR MESNER     Transfusion Status OK TO TRANSFUSE    Crossmatch Result COMPATIBLE    Unit Number [id number]    Blood Component Type RED CELLS,LR    Unit division 00    Status of Unit REL FROM [facility]    Unit tag comment VERBAL ORDERS PER DR MESNER    Transfusion Status OK TO TRANSFUSE    Crossmatch Result COMPATIBLE   Triglycerides     Status: None   Collection Time: 04/05/16  5:35 PM  Result Value Ref Range   Triglycerides 144 <150 mg/dL  CDS serology     Status: None   Collection Time: 04/05/16  5:35 PM  Result Value Ref Range   CDS serology specimen      SPECIMEN WILL BE HELD FOR 14 DAYS IF TESTING  IS REQUIRED  Comprehensive metabolic panel     Status: Abnormal   Collection Time: 04/05/16  5:35 PM  Result Value Ref Range   Sodium 136 135 - 145 mmol/L   Potassium 4.6 3.5 - 5.1 mmol/L   Chloride 102 101 - 111 mmol/L   CO2 21 (L) 22 - 32 mmol/L   Glucose, Bld 135 (H) 65 - 99 mg/dL   BUN 14 6 - 20 mg/dL   Creatinine, Ser 0.88 0.61 - 1.24 mg/dL   Calcium 9.2 8.9 - 10.3 mg/dL   Total Protein 6.6 6.5 - 8.1 g/dL   Albumin 4.2 3.5 - 5.0 g/dL   AST 99 (H) 15 - 41 U/L   ALT 68 (H) 17 - 63 U/L   Alkaline Phosphatase 72 38 - 126 U/L   Total Bilirubin 1.1 0.3 - 1.2 mg/dL   GFR calc non Af Amer >60 >60 mL/min   GFR calc Af Amer >60 >60 mL/min   Anion gap 13 5 - 15  CBC     Status: Abnormal   Collection Time: 04/05/16  5:35 PM  Result Value Ref Range   WBC 16.7 (H) 4.0 - 10.5 K/uL   RBC 5.24 4.22 - 5.81 MIL/uL   Hemoglobin 16.2 13.0 - 17.0 g/dL   HCT 48.1 39.0 - 52.0 %   MCV 91.8 78.0 - 100.0 fL   MCH 30.9 26.0 - 34.0 pg   MCHC 33.7 30.0 - 36.0 g/dL   RDW 13.7 11.5 - 15.5 %   Platelets 233 150 - 400 K/uL  Protime-INR     Status: None   Collection Time: 04/05/16  5:35 PM  Result Value Ref Range   Prothrombin Time 13.4 11.4 - 15.2 seconds   INR 1.02   ABO/Rh     Status: None   Collection Time: 04/05/16  5:35 PM  Result Value Ref Range   ABO/RH(D) B POS   Ethanol     Status: Abnormal    Collection Time: 04/05/16  5:45 PM  Result Value Ref Range   Alcohol, Ethyl (B) 13 (H) <5 mg/dL  I-Stat Chem 8, ED     Status: Abnormal   Collection Time: 04/05/16  5:46 PM  Result Value Ref Range   Sodium 140 135 - 145 mmol/L   Potassium 3.6 3.5 - 5.1 mmol/L   Chloride 103 101 - 111 mmol/L   BUN 16 6 - 20 mg/dL   Creatinine, Ser 0.90 0.61 - 1.24 mg/dL   Glucose, Bld 144 (H) 65 - 99 mg/dL   Calcium, Ion 1.18 1.15 - 1.40 mmol/L   TCO2 24 0 - 100 mmol/L   Hemoglobin 16.0 13.0 - 17.0 g/dL   HCT 47.0 39.0 - 52.0 %  I-Stat CG4 Lactic Acid, ED     Status: Abnormal   Collection Time: 04/05/16  5:47 PM  Result Value Ref Range   Lactic Acid, Venous 2.92 (HH) 0.5 - 1.9 mmol/L   Comment NOTIFIED PHYSICIAN   Blood gas, arterial     Status: Abnormal   Collection Time: 04/05/16  8:05 PM  Result Value Ref Range   FIO2 100.00    Delivery systems VENTILATOR    Mode PRESSURE REGULATED VOLUME CONTROL    VT 500 mL   LHR 15 resp/min   Peep/cpap 5.0 cm H20   pH, Arterial 7.378 7.350 - 7.450   pCO2 arterial 37.4 32.0 - 48.0 mmHg   pO2, Arterial 368 (H) 83.0 - 108.0 mmHg   Bicarbonate 21.5 20.0 -  28.0 mmol/L   Acid-base deficit 2.8 (H) 0.0 - 2.0 mmol/L   O2 Saturation 99.4 %   Patient temperature 98.6    Collection site RIGHT RADIAL    Drawn by [id number]    Sample type ARTERIAL DRAW    Allens test (pass/fail) PASS PASS  CBC     Status: Abnormal   Collection Time: 04/05/16  9:13 PM  Result Value Ref Range   WBC 24.9 (H) 4.0 - 10.5 K/uL   RBC 4.65 4.22 - 5.81 MIL/uL   Hemoglobin 14.0 13.0 - 17.0 g/dL   HCT 42.8 39.0 - 52.0 %   MCV 92.0 78.0 - 100.0 fL   MCH 30.1 26.0 - 34.0 pg   MCHC 32.7 30.0 - 36.0 g/dL   RDW 13.7 11.5 - 15.5 %   Platelets 183 150 - 400 K/uL  Basic metabolic panel     Status: Abnormal   Collection Time: 04/05/16  9:13 PM  Result Value Ref Range   Sodium 137 135 - 145 mmol/L   Potassium 5.1 3.5 - 5.1 mmol/L   Chloride 106 101 - 111 mmol/L   CO2 22 22 - 32 mmol/L    Glucose, Bld 163 (H) 65 - 99 mg/dL   BUN 16 6 - 20 mg/dL   Creatinine, Ser 0.94 0.61 - 1.24 mg/dL   Calcium 8.4 (L) 8.9 - 10.3 mg/dL   GFR calc non Af Amer >60 >60 mL/min   GFR calc Af Amer >60 >60 mL/min   Anion gap 9 5 - 15  MRSA PCR Screening     Status: None   Collection Time: 04/05/16 10:19 PM  Result Value Ref Range   MRSA by PCR NEGATIVE NEGATIVE  Urinalysis, Routine w reflex microscopic     Status: Abnormal   Collection Time: 04/05/16 10:50 PM  Result Value Ref Range   Color, Urine AMBER (A) YELLOW   APPearance CLOUDY (A) CLEAR   Specific Gravity, Urine 1.010 1.005 - 1.030   pH 5.5 5.0 - 8.0   Glucose, UA NEGATIVE NEGATIVE mg/dL   Hgb urine dipstick LARGE (A) NEGATIVE   Bilirubin Urine NEGATIVE NEGATIVE   Ketones, ur NEGATIVE NEGATIVE mg/dL   Protein, ur 100 (A) NEGATIVE mg/dL   Nitrite NEGATIVE NEGATIVE   Leukocytes, UA NEGATIVE NEGATIVE  Urine microscopic-add on     Status: Abnormal   Collection Time: 04/05/16 10:50 PM  Result Value Ref Range   Squamous Epithelial / LPF 0-5 (A) NONE SEEN   WBC, UA NONE SEEN 0 - 5 WBC/hpf   RBC / HPF TOO NUMEROUS TO COUNT 0 - 5 RBC/hpf   Bacteria, UA FEW (A) NONE SEEN   Casts GRANULAR CAST (A) NEGATIVE  CBC     Status: Abnormal   Collection Time: 04/06/16  7:47 AM  Result Value Ref Range   WBC 12.6 (H) 4.0 - 10.5 K/uL   RBC 3.76 (L) 4.22 - 5.81 MIL/uL   Hemoglobin 11.2 (L) 13.0 - 17.0 g/dL   HCT 34.5 (L) 39.0 - 52.0 %   MCV 91.8 78.0 - 100.0 fL   MCH 29.8 26.0 - 34.0 pg   MCHC 32.5 30.0 - 36.0 g/dL   RDW 13.9 11.5 - 15.5 %   Platelets 142 (L) 150 - 400 K/uL  Comprehensive metabolic panel     Status: Abnormal   Collection Time: 04/06/16  7:47 AM  Result Value Ref Range   Sodium 134 (L) 135 - 145 mmol/L   Potassium 4.5 3.5 - 5.1  mmol/L   Chloride 105 101 - 111 mmol/L   CO2 21 (L) 22 - 32 mmol/L   Glucose, Bld 134 (H) 65 - 99 mg/dL   BUN 16 6 - 20 mg/dL   Creatinine, Ser 1.00 0.61 - 1.24 mg/dL   Calcium 7.6 (L) 8.9 - 10.3  mg/dL   Total Protein 5.0 (L) 6.5 - 8.1 g/dL   Albumin 3.0 (L) 3.5 - 5.0 g/dL   AST 86 (H) 15 - 41 U/L   ALT 52 17 - 63 U/L   Alkaline Phosphatase 50 38 - 126 U/L   Total Bilirubin 0.7 0.3 - 1.2 mg/dL   GFR calc non Af Amer >60 >60 mL/min   GFR calc Af Amer >60 >60 mL/min   Anion gap 8 5 - 15  Protime-INR     Status: None   Collection Time: 04/06/16  7:47 AM  Result Value Ref Range   Prothrombin Time 15.0 11.4 - 15.2 seconds   INR 1.17     Assessment & Plan: Present on Admission: **None**    LOS: 1 day   Additional comments:I reviewed the patient's new clinical lab test results. and CTs [facility] TBI/SAH/IVH/SDH - EVD per Dr. Arnoldo Morale, check ICPs, ABG now, F/U CT head pending C2, C3, C5 FXs with EDH - collar per Dr. Arnoldo Morale Rib FXs L 2-7, R 2,6,11; B pulm contusions- CXR now Vent dependent resp failure - Full support, goal PCO2 35 L talus and calcaneus FXs - per Dr. Ninfa Linden R prox fibula FX - per Dr. Ninfa Linden ABL anemia VTE - PAS Dispo - ICU I spoke with his wife and sister in law at the bedside  Critical Care Total Time*: 82 Minutes  Georganna Skeans, MD, MPH, FACS Trauma: [phone] General Surgery: [phone]  04/06/2016  *Care during the described time interval was provided by me. I have reviewed this patient's available data, including medical history, events of note, physical examination and test results as part of my evaluation.  Patient ID: DMANI ECKERD, male   DOB: [birth year], 55 y.o.   MRN: [id number]

## 2016-04-06 NOTE — Brief Op Note (Signed)
04/05/2016 - 04/06/2016  1:46 AM  PATIENT:  [name]  55 y.o. male  PRE-OPERATIVE DIAGNOSIS:  Bilateral talus/calcaneus fracture-dislocations with the right open and the left closed  POST-OPERATIVE DIAGNOSIS: same  PROCEDURE:  Procedure(s): IRRIGATION AND DEBRIDEMENT RIGHT ANKLE OPEN CALCANEUS TALUS FRACTURE (Right) SPLINT APPLICATION BILATERAL (Bilateral) OPEN REDUCTION TALUS AND DISLOCATION (Left)  SURGEON:  Surgeon(s) and Role:    * Mcarthur Rossetti, MD - Primary  PHYSICIAN ASSISTANT: Benita Stabile, PA-C  ANESTHESIA:   general  EBL:  Total I/O In: 1616.7 [I.V.:1616.7] Out: 760 [Urine:550; Other:60; Blood:150]  COUNTS:  YES  PLAN OF CARE: Admit to inpatient   PATIENT DISPOSITION:  ICU - intubated and hemodynamically stable.   Delay start of Pharmacological VTE agent (>24hrs) due to surgical blood loss or risk of bleeding: no

## 2016-04-07 ENCOUNTER — Inpatient Hospital Stay (HOSPITAL_COMMUNITY): Payer: No Typology Code available for payment source

## 2016-04-07 LAB — BASIC METABOLIC PANEL
Anion gap: 5 (ref 5–15)
BUN: 16 mg/dL (ref 6–20)
CO2: 22 mmol/L (ref 22–32)
Calcium: 7.4 mg/dL — ABNORMAL LOW (ref 8.9–10.3)
Chloride: 112 mmol/L — ABNORMAL HIGH (ref 101–111)
Creatinine, Ser: 0.82 mg/dL (ref 0.61–1.24)
GFR calc Af Amer: >60 mL/min (ref >60)
GFR calc non Af Amer: >60 mL/min (ref >60)
Glucose, Bld: 155 mg/dL — ABNORMAL HIGH (ref 65–99)
Potassium: 4.2 mmol/L (ref 3.5–5.1)
Sodium: 139 mmol/L (ref 135–145)

## 2016-04-07 MED ORDER — ACETAMINOPHEN 160 MG/5ML PO SOLN
650.0000 mg | Freq: Four times a day (QID) | ORAL | Status: DC | PRN
  Administered 2016-04-08 – 2016-04-30 (×9): 650 mg
  Filled 2016-04-07 (×9): qty 20.3
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
## 2016-04-08 ENCOUNTER — Inpatient Hospital Stay (HOSPITAL_COMMUNITY): Payer: No Typology Code available for payment source

## 2016-04-08 ENCOUNTER — Encounter (HOSPITAL_COMMUNITY): Payer: Self-pay | Admitting: Orthopaedic Surgery

## 2016-04-08 LAB — BLOOD GAS, ARTERIAL
Acid-Base Excess: 0.5 mmol/L (ref 0.0–2.0)
Acid-base deficit: 1.4 mmol/L (ref 0.0–2.0)
Bicarbonate: 23.2 mmol/L (ref 20.0–28.0)
Bicarbonate: 24.5 mmol/L (ref 20.0–28.0)
Drawn by: 46203
Drawn by: 27407
FIO2: 0.4
FIO2: 40
MECHVT: 600 mL
MECHVT: 600 mL
O2 Saturation: 96.5 %
O2 Saturation: 98.1 %
PEEP: 5 cmH2O
PEEP: 5 cmH2O
Patient temperature: 98.6
Patient temperature: 98.6
RATE: 22 resp/min
RATE: 24 resp/min
pCO2 arterial: 41.8 mmHg (ref 32.0–48.0)
pCO2 arterial: 39.1 mmHg (ref 32.0–48.0)
pH, Arterial: 7.363 (ref 7.350–7.450)
pH, Arterial: 7.414 (ref 7.350–7.450)
pO2, Arterial: 87.5 mmHg (ref 83.0–108.0)
pO2, Arterial: 97.2 mmHg (ref 83.0–108.0)

## 2016-04-08 LAB — TYPE AND SCREEN
ABO/RH(D): B POS
Antibody Screen: NEGATIVE
Unit division: 0
Unit division: 0
Unit division: 0
Unit division: 0

## 2016-04-08 LAB — CBC
HCT: 27.2 % — ABNORMAL LOW (ref 39.0–52.0)
HCT: 20.7 % — ABNORMAL LOW (ref 39.0–52.0)
HCT: 26 % — ABNORMAL LOW (ref 39.0–52.0)
Hemoglobin: 8.5 g/dL — ABNORMAL LOW (ref 13.0–17.0)
Hemoglobin: 6.7 g/dL — CL (ref 13.0–17.0)
Hemoglobin: 8.3 g/dL — ABNORMAL LOW (ref 13.0–17.0)
MCH: 29.1 pg (ref 26.0–34.0)
MCH: 30 pg (ref 26.0–34.0)
MCH: 29.2 pg (ref 26.0–34.0)
MCHC: 31.3 g/dL (ref 30.0–36.0)
MCHC: 32.4 g/dL (ref 30.0–36.0)
MCHC: 31.9 g/dL (ref 30.0–36.0)
MCV: 93.2 fL (ref 78.0–100.0)
MCV: 92.8 fL (ref 78.0–100.0)
MCV: 91.5 fL (ref 78.0–100.0)
Platelets: 109 10*3/uL — ABNORMAL LOW (ref 150–400)
Platelets: 86 10*3/uL — ABNORMAL LOW (ref 150–400)
Platelets: 104 10*3/uL — ABNORMAL LOW (ref 150–400)
RBC: 2.92 MIL/uL — ABNORMAL LOW (ref 4.22–5.81)
RBC: 2.23 MIL/uL — ABNORMAL LOW (ref 4.22–5.81)
RBC: 2.84 MIL/uL — ABNORMAL LOW (ref 4.22–5.81)
RDW: 14 % (ref 11.5–15.5)
RDW: 14.1 % (ref 11.5–15.5)
RDW: 14.6 % (ref 11.5–15.5)
WBC: 11.1 10*3/uL — ABNORMAL HIGH (ref 4.0–10.5)
WBC: 8.9 10*3/uL (ref 4.0–10.5)
WBC: 9.8 10*3/uL (ref 4.0–10.5)

## 2016-04-08 LAB — BASIC METABOLIC PANEL
Anion gap: 5 (ref 5–15)
BUN: 11 mg/dL (ref 6–20)
CO2: 24 mmol/L (ref 22–32)
Calcium: 7.7 mg/dL — ABNORMAL LOW (ref 8.9–10.3)
Chloride: 111 mmol/L (ref 101–111)
Creatinine, Ser: 0.62 mg/dL (ref 0.61–1.24)
GFR calc Af Amer: >60 mL/min (ref >60)
GFR calc non Af Amer: >60 mL/min (ref >60)
Glucose, Bld: 134 mg/dL — ABNORMAL HIGH (ref 65–99)
Potassium: 3.6 mmol/L (ref 3.5–5.1)
Sodium: 140 mmol/L (ref 135–145)

## 2016-04-08 LAB — LACTIC ACID, PLASMA: Lactic Acid, Venous: 0.8 mmol/L (ref 0.5–1.9)

## 2016-04-08 LAB — GLUCOSE, CAPILLARY
Glucose-Capillary: 150 mg/dL — ABNORMAL HIGH (ref 65–99)
Glucose-Capillary: 156 mg/dL — ABNORMAL HIGH (ref 65–99)
Glucose-Capillary: 162 mg/dL — ABNORMAL HIGH (ref 65–99)
Glucose-Capillary: 181 mg/dL — ABNORMAL HIGH (ref 65–99)

## 2016-04-08 LAB — PREPARE RBC (CROSSMATCH)

## 2016-04-08 LAB — MAGNESIUM
Magnesium: 2 mg/dL (ref 1.7–2.4)
Magnesium: 2 mg/dL (ref 1.7–2.4)

## 2016-04-08 LAB — PHOSPHORUS
Phosphorus: 1.6 mg/dL — ABNORMAL LOW (ref 2.5–4.6)
Phosphorus: 1.7 mg/dL — ABNORMAL LOW (ref 2.5–4.6)

## 2016-04-08 MED ORDER — METOPROLOL TARTRATE 25 MG/10 ML ORAL SUSPENSION
12.5000 mg | Freq: Two times a day (BID) | ORAL | Status: DC
  Administered 2016-04-08 – 2016-04-10 (×5): 12.5 mg
  Filled 2016-04-08 (×6): qty 10

## 2016-04-08 MED ORDER — SODIUM CHLORIDE 0.9 % IV SOLN: Freq: Once | INTRAVENOUS | Status: DC

## 2016-04-08 MED ORDER — VITAL HIGH PROTEIN PO LIQD
1000.0000 mL | ORAL | Status: DC
  Administered 2016-04-08: 1000 mL

## 2016-04-08 MED ORDER — PIVOT 1.5 CAL PO LIQD
1000.0000 mL | ORAL | Status: DC
  Administered 2016-04-08 – 2016-04-22 (×12): 1000 mL
  Administered 2016-04-27: 65 mL
  Administered 2016-04-27 – 2016-04-30 (×4): 1000 mL
  Filled 2016-04-08 (×12): qty 1000

## 2016-04-08 MED ORDER — VITAMIN C 500 MG PO TABS
1000.0000 mg | ORAL_TABLET | Freq: Three times a day (TID) | ORAL | Status: DC
  Administered 2016-04-08 – 2016-04-14 (×16): 1000 mg
  Filled 2016-04-08 (×16): qty 2

## 2016-04-08 MED ORDER — SELENIUM 50 MCG PO TABS
200.0000 ug | ORAL_TABLET | Freq: Every day | ORAL | Status: DC
  Administered 2016-04-08 – 2016-04-14 (×5): 200 ug
  Filled 2016-04-08 (×7): qty 4

## 2016-04-08 MED ORDER — PRO-STAT SUGAR FREE PO LIQD
30.0000 mL | Freq: Two times a day (BID) | ORAL | Status: DC
  Administered 2016-04-08 – 2016-04-30 (×33): 30 mL
  Filled 2016-04-08 (×35): qty 30

## 2016-04-08 NOTE — Progress Notes (Signed)
Patient ID: [name], male   DOB: 09-23-1960, 55 y.o.   MRN: [id number] I called his wife to give her an update. No answer. Left VM. Georganna Skeans, MD, MPH, FACS Trauma: [phone] General Surgery: [phone]

## 2016-04-08 NOTE — Progress Notes (Addendum)
Follow up - Trauma Critical Care  Patient Details:    [name] is an 55 y.o. male.  Lines/tubes : Airway 7.5 mm (Active)  Secured at (cm) 25 cm 04/08/2016  7:29 AM  Measured From Lips 04/08/2016  7:29 AM  Secured Location Right 04/08/2016  7:29 AM  Secured By [employer] 04/08/2016  7:29 AM  Tube Holder Repositioned Yes 04/08/2016  7:29 AM  Cuff Pressure (cm H2O) 24 cm H2O 04/08/2016  3:30 AM  Site Condition Dry 04/08/2016  7:29 AM     PICC Triple Lumen [id number] PICC Right Basilic 43 cm 0 cm (Active)  Indication for Insertion or Continuance of Line Administration of hyperosmolar/irritating solutions (i.e. TPN, Vancomycin, etc.) 04/08/2016  7:25 AM  Exposed Catheter (cm) 0 cm 04/06/2016 11:54 AM  Site Assessment Clean;Dry;Intact 04/08/2016  8:00 AM  Lumen #1 Status Infusing 04/08/2016  8:00 AM  Lumen #2 Status Infusing 04/08/2016  8:00 AM  Lumen #3 Status Saline locked 04/08/2016  8:00 AM  Dressing Type Transparent 04/08/2016  8:00 AM  Dressing Status Clean;Dry;Intact;Antimicrobial disc in place 04/08/2016  8:00 AM  Dressing Change Due 04/13/16 04/08/2016  8:00 AM     Arterial Line 04/05/16 Right Pedal (Active)  Site Assessment Clean;Dry;Intact 04/08/2016  8:00 AM  Line Status Pulsatile blood flow 04/08/2016  8:00 AM  Art Line Waveform Appropriate;Square wave test performed 04/08/2016  8:00 AM  Art Line Interventions Zeroed and calibrated;Leveled;Tubing changed;Connections checked and tightened 04/08/2016  8:00 AM  Color/Movement/Sensation Capillary refill less than 3 sec 04/08/2016  8:00 AM  Dressing Type Transparent 04/08/2016  8:00 AM  Dressing Status Clean;Dry;Intact 04/08/2016  8:00 AM  Dressing Change Due 04/12/16 04/06/2016  8:00 AM     NG/OG Tube Orogastric 16 Fr. Center mouth Xray (Active)  Site Assessment Clean;Dry;Intact 04/08/2016  8:00 AM  Ongoing Placement Verification Auscultation 04/08/2016  8:00 AM  Status Clamped 04/08/2016  8:00 AM  Drainage Appearance Clear;Mucus;Tan  04/07/2016  8:00 AM     Urethral Catheter Darla Anastasia Fiedler RN Non-latex;Temperature probe 16 Fr. (Active)  Indication for Insertion or Continuance of Catheter Other (comment) 04/08/2016  7:25 AM  Site Assessment Clean;Intact 04/08/2016  8:00 AM  Catheter Maintenance Bag below level of bladder;Insertion date on drainage bag;No dependent loops;Catheter secured;Drainage bag/tubing not touching floor;Seal intact;Bag emptied prior to transport 04/08/2016  7:25 AM  Collection Container Standard drainage bag 04/08/2016  8:00 AM  Securement Method Securing device (Describe) 04/08/2016  8:00 AM  Urinary Catheter Interventions Unclamped 04/08/2016  8:00 AM  Output (mL) 125 mL 04/08/2016  6:47 AM    Microbiology/Sepsis markers: Results for orders placed or performed during the hospital encounter of 04/05/16  MRSA PCR Screening     Status: None   Collection Time: 04/05/16 10:19 PM  Result Value Ref Range Status   MRSA by PCR NEGATIVE NEGATIVE Final    Comment:        The GeneXpert MRSA Assay (FDA approved for NASAL specimens only), is one component of a comprehensive MRSA colonization surveillance program. It is not intended to diagnose MRSA infection nor to guide or monitor treatment for MRSA infections.     Anti-infectives:  Anti-infectives    Start     Dose/Rate Route Frequency Ordered Stop   04/06/16 0600  ceFAZolin (ANCEF) IVPB 1 g/50 mL premix     1 g 100 mL/hr over 30 Minutes Intravenous Every 8 hours 04/06/16 0206     04/05/16 1729  ceFAZolin (ANCEF) 2-4 GM/100ML-% IVPB  Comments:  Salome Arnt   : cabinet override      04/05/16 1729 04/05/16 1937      Best Practice/Protocols:   Continous Sedation  Consults: Treatment Team:  Mcarthur Rossetti, MD Newman Pies, MD    Studies:    Events:  Subjective:    Overnight Issues:   Objective:  Vital signs for last 24 hours: Temp:  [99.5 F (37.5 C)-100.8 F (38.2 C)] 100.2 F (37.9 C) (10/02 0800) Pulse Rate:   [81-113] 108 (10/02 0800) Resp:  [0-27] 24 (10/02 0800) BP: (99-140)/(53-74) 107/63 (10/02 0700) SpO2:  [98 %-100 %] 99 % (10/02 0800) Arterial Line BP: (94-134)/(40-62) 132/54 (10/02 0800) FiO2 (%):  [40 %] 40 % (10/02 0800)  Hemodynamic parameters for last 24 hours:    Intake/Output from previous day: 10/01 0701 - 10/02 0700 In: 3838 [I.V.:3588; IV Piggyback:250] Out: 1320 [Urine:1320]  Intake/Output this shift: Total I/O In: 152 [I.V.:152] Out: -   Vent settings for last 24 hours: Vent Mode: PRVC FiO2 (%):  [40 %] 40 % Set Rate:  [24 bmp-25 bmp] 25 bmp Vt Set:  [600 mL] 600 mL PEEP:  [5 cmH20] 5 cmH20 Plateau Pressure:  [18 cmH20-23 cmH20] 23 cmH20  Physical Exam:  General: on vent Neuro: purposeful with RUE, pupils 20mm and slug HEENT/Neck: ETT and some L periorbital ecchymosis Resp: clear to auscultation bilaterally CVS: RRR 100 GI: soft, NT, ND Extremities: B foot ortho dressings  No results found for this or any previous visit (from the past 24 hour(s)).  Assessment & Plan: Present on Admission: **None**    LOS: 3 days   Additional comments:I reviewed the patient's new clinical lab test results. Marland Kitchen [facility] TBI/SAH/IVH/SDH - EVD removed by Dr. Arnoldo Morale, exam a bit better, will check with NS re timing of F/U CT H C2, C3, C5 FXs with EDH - collar per Dr. Arnoldo Morale Rib FXs L 2-7, R 2,6,11; B pulm contusions - CXR in AM, good gas exchange Vent dependent resp failure - ABG now and will start to wean today L talus and calcaneus FXs - per Dr. Ninfa Linden Dr. [name] to eval today R prox fibula FX - per Dr. Ninfa Linden ABL anemia VTE - PAS, cannot use mechanical, no Lovenox until 24-48h after stable F/U CT H, see above FEN - start TF Dispo - ICU Critical Care Total Time*: 40 Minutes  Georganna Skeans, MD, MPH, FACS Trauma: [phone] General Surgery: [phone]  04/08/2016  *Care during the described time interval was provided by me. I have reviewed this patient's  available data, including medical history, events of note, physical examination and test results as part of my evaluation.  Patient ID: JERIMIE MIZZELL, male   DOB: February 28, 1961, 55 y.o.   MRN: [id number]
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
## 2016-04-08 NOTE — Progress Notes (Signed)
Lab called, critical Hgb 6.7. Dr Grandville Silos notified.
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
## 2016-04-09 ENCOUNTER — Inpatient Hospital Stay (HOSPITAL_COMMUNITY): Payer: No Typology Code available for payment source

## 2016-04-09 ENCOUNTER — Encounter (HOSPITAL_COMMUNITY): Payer: Self-pay | Admitting: Certified Registered Nurse Anesthetist

## 2016-04-09 ENCOUNTER — Inpatient Hospital Stay (HOSPITAL_COMMUNITY): Payer: No Typology Code available for payment source | Admitting: Certified Registered Nurse Anesthetist

## 2016-04-09 ENCOUNTER — Encounter (HOSPITAL_COMMUNITY): Admission: EM | Disposition: A | Discharge status: Rehabilitation Facility | Payer: Self-pay | Source: Home / Self Care

## 2016-04-09 HISTORY — PX: APPLICATION OF WOUND VAC: SHX5189

## 2016-04-09 HISTORY — PX: I&D EXTREMITY: SHX5045

## 2016-04-09 HISTORY — PX: ORIF CALCANEOUS FRACTURE: SHX5030

## 2016-04-09 HISTORY — PX: EXTERNAL FIXATION LEG: SHX1549

## 2016-04-09 LAB — CBC
HCT: 23.5 % — ABNORMAL LOW (ref 39.0–52.0)
Hemoglobin: 7.6 g/dL — ABNORMAL LOW (ref 13.0–17.0)
MCH: 29.6 pg (ref 26.0–34.0)
MCHC: 32.3 g/dL (ref 30.0–36.0)
MCV: 91.4 fL (ref 78.0–100.0)
Platelets: 112 10*3/uL — ABNORMAL LOW (ref 150–400)
RBC: 2.57 MIL/uL — ABNORMAL LOW (ref 4.22–5.81)
RDW: 15 % (ref 11.5–15.5)
WBC: 8.9 10*3/uL (ref 4.0–10.5)

## 2016-04-09 LAB — GLUCOSE, CAPILLARY
Glucose-Capillary: 129 mg/dL — ABNORMAL HIGH (ref 65–99)
Glucose-Capillary: 143 mg/dL — ABNORMAL HIGH (ref 65–99)
Glucose-Capillary: 127 mg/dL — ABNORMAL HIGH (ref 65–99)
Glucose-Capillary: 119 mg/dL — ABNORMAL HIGH (ref 65–99)
Glucose-Capillary: 133 mg/dL — ABNORMAL HIGH (ref 65–99)

## 2016-04-09 LAB — BASIC METABOLIC PANEL
Anion gap: 7 (ref 5–15)
BUN: 16 mg/dL (ref 6–20)
CO2: 25 mmol/L (ref 22–32)
Calcium: 8.1 mg/dL — ABNORMAL LOW (ref 8.9–10.3)
Chloride: 111 mmol/L (ref 101–111)
Creatinine, Ser: 0.57 mg/dL — ABNORMAL LOW (ref 0.61–1.24)
GFR calc Af Amer: >60 mL/min (ref >60)
GFR calc non Af Amer: >60 mL/min (ref >60)
Glucose, Bld: 128 mg/dL — ABNORMAL HIGH (ref 65–99)
Potassium: 3.4 mmol/L — ABNORMAL LOW (ref 3.5–5.1)
Sodium: 143 mmol/L (ref 135–145)

## 2016-04-09 LAB — BLOOD GAS, ARTERIAL
Acid-Base Excess: 1.1 mmol/L (ref 0.0–2.0)
Bicarbonate: 25.1 mmol/L (ref 20.0–28.0)
Drawn by: 362771
FIO2: 40
MECHVT: 600 mL
O2 Saturation: 96.8 %
PEEP: 5 cmH2O
Patient temperature: 98.6
RATE: 24 resp/min
pCO2 arterial: 39.6 mmHg (ref 32.0–48.0)
pH, Arterial: 7.419 (ref 7.350–7.450)
pO2, Arterial: 85.4 mmHg (ref 83.0–108.0)

## 2016-04-09 LAB — TRIGLYCERIDES: Triglycerides: 128 mg/dL (ref <150)

## 2016-04-09 LAB — PHOSPHORUS: Phosphorus: 2.4 mg/dL — ABNORMAL LOW (ref 2.5–4.6)

## 2016-04-09 LAB — PREPARE RBC (CROSSMATCH)

## 2016-04-09 LAB — MAGNESIUM: Magnesium: 2.1 mg/dL (ref 1.7–2.4)

## 2016-04-09 LAB — BLOOD PRODUCT ORDER (VERBAL) VERIFICATION

## 2016-04-09 SURGERY — IRRIGATION AND DEBRIDEMENT EXTREMITY
Anesthesia: General | Site: Leg Lower | Laterality: Right

## 2016-04-09 MED ORDER — PHENYLEPHRINE 40 MCG/ML (10ML) SYRINGE FOR IV PUSH (FOR BLOOD PRESSURE SUPPORT)
PREFILLED_SYRINGE | INTRAVENOUS | Status: AC
  Filled 2016-04-09: qty 20

## 2016-04-09 MED ORDER — SODIUM CHLORIDE 0.9 % IR SOLN
Status: DC | PRN
  Administered 2016-04-09: 1000 mL

## 2016-04-09 MED ORDER — ARTIFICIAL TEARS OP OINT
TOPICAL_OINTMENT | OPHTHALMIC | Status: DC | PRN
  Administered 2016-04-09: 1 via OPHTHALMIC

## 2016-04-09 MED ORDER — PROMETHAZINE HCL 25 MG/ML IJ SOLN: 6.2500 mg | INTRAMUSCULAR | Status: DC | PRN

## 2016-04-09 MED ORDER — KETAMINE HCL-SODIUM CHLORIDE 100-0.9 MG/10ML-% IV SOSY
PREFILLED_SYRINGE | INTRAVENOUS | Status: AC
  Filled 2016-04-09: qty 10

## 2016-04-09 MED ORDER — ROCURONIUM BROMIDE 100 MG/10ML IV SOLN
INTRAVENOUS | Status: DC | PRN
  Administered 2016-04-09 (×4): 50 mg via INTRAVENOUS

## 2016-04-09 MED ORDER — CEFAZOLIN IN D5W 1 GM/50ML IV SOLN
1.0000 g | Freq: Three times a day (TID) | INTRAVENOUS | Status: AC
  Administered 2016-04-09 – 2016-04-13 (×11): 1 g via INTRAVENOUS
  Filled 2016-04-09 (×12): qty 50

## 2016-04-09 MED ORDER — CEFAZOLIN SODIUM-DEXTROSE 2-3 GM-% IV SOLR
INTRAVENOUS | Status: DC | PRN
  Administered 2016-04-09 (×2): 2 g via INTRAVENOUS

## 2016-04-09 MED ORDER — TOBRAMYCIN SULFATE 1.2 G IJ SOLR
INTRAMUSCULAR | Status: AC
  Filled 2016-04-09: qty 1.2

## 2016-04-09 MED ORDER — ROCURONIUM BROMIDE 10 MG/ML (PF) SYRINGE
PREFILLED_SYRINGE | INTRAVENOUS | Status: AC
  Filled 2016-04-09: qty 20

## 2016-04-09 MED ORDER — FENTANYL CITRATE (PF) 100 MCG/2ML IJ SOLN
INTRAMUSCULAR | Status: DC | PRN
  Administered 2016-04-09: 100 ug via INTRAVENOUS
  Administered 2016-04-09 (×3): 50 ug via INTRAVENOUS
  Administered 2016-04-09: 100 ug via INTRAVENOUS
  Administered 2016-04-09 (×4): 50 ug via INTRAVENOUS
  Administered 2016-04-09: 100 ug via INTRAVENOUS
  Administered 2016-04-09: 50 ug via INTRAVENOUS

## 2016-04-09 MED ORDER — POTASSIUM CHLORIDE 10 MEQ/50ML IV SOLN
10.0000 meq | INTRAVENOUS | Status: AC
  Administered 2016-04-09 (×2): 10 meq via INTRAVENOUS
  Filled 2016-04-09 (×2): qty 50

## 2016-04-09 MED ORDER — ARTIFICIAL TEARS OP OINT
TOPICAL_OINTMENT | OPHTHALMIC | Status: AC
  Filled 2016-04-09: qty 3.5

## 2016-04-09 MED ORDER — HYDROMORPHONE HCL 1 MG/ML IJ SOLN: 0.2500 mg | INTRAMUSCULAR | Status: DC | PRN

## 2016-04-09 MED ORDER — SODIUM CHLORIDE 0.9 % IV SOLN: Freq: Once | INTRAVENOUS | Status: AC

## 2016-04-09 MED ORDER — SUGAMMADEX SODIUM 500 MG/5ML IV SOLN
INTRAVENOUS | Status: AC
  Filled 2016-04-09: qty 5

## 2016-04-09 MED ORDER — ROCURONIUM 10MG/ML (10ML) SYRINGE FOR MEDFUSION PUMP - OPTIME: INTRAVENOUS | Status: DC | PRN

## 2016-04-09 MED ORDER — FENTANYL CITRATE (PF) 100 MCG/2ML IJ SOLN
INTRAMUSCULAR | Status: AC
  Filled 2016-04-09: qty 2

## 2016-04-09 MED ORDER — KETOROLAC TROMETHAMINE 30 MG/ML IJ SOLN: 30.0000 mg | Freq: Once | INTRAMUSCULAR | Status: DC | PRN

## 2016-04-09 MED ORDER — PROPOFOL 10 MG/ML IV BOLUS
INTRAVENOUS | Status: AC
  Filled 2016-04-09: qty 20

## 2016-04-09 MED ORDER — EPHEDRINE 5 MG/ML INJ
INTRAVENOUS | Status: AC
  Filled 2016-04-09: qty 20

## 2016-04-09 MED ORDER — FENTANYL CITRATE (PF) 100 MCG/2ML IJ SOLN
INTRAMUSCULAR | Status: AC
  Filled 2016-04-09: qty 4

## 2016-04-09 MED ORDER — VANCOMYCIN HCL 1000 MG IV SOLR
INTRAVENOUS | Status: DC | PRN
  Administered 2016-04-09: 1000 mg

## 2016-04-09 MED ORDER — CEFAZOLIN SODIUM 1 G IJ SOLR
INTRAMUSCULAR | Status: AC
  Filled 2016-04-09: qty 20

## 2016-04-09 MED ORDER — SODIUM CHLORIDE 0.9 % IV SOLN: Freq: Once | INTRAVENOUS | Status: DC

## 2016-04-09 MED ORDER — VANCOMYCIN HCL 1000 MG IV SOLR
INTRAVENOUS | Status: AC
  Filled 2016-04-09: qty 1000

## 2016-04-09 MED ORDER — GLYCOPYRROLATE 0.2 MG/ML IV SOSY
PREFILLED_SYRINGE | INTRAVENOUS | Status: AC
  Filled 2016-04-09: qty 3

## 2016-04-09 MED ORDER — LACTATED RINGERS IV SOLN
INTRAVENOUS | Status: DC | PRN
  Administered 2016-04-09 (×2): via INTRAVENOUS

## 2016-04-09 MED ORDER — ROCURONIUM BROMIDE 10 MG/ML (PF) SYRINGE
PREFILLED_SYRINGE | INTRAVENOUS | Status: AC
  Filled 2016-04-09: qty 10

## 2016-04-09 MED ORDER — PROPOFOL 10 MG/ML IV BOLUS
INTRAVENOUS | Status: DC | PRN
  Administered 2016-04-09: 50 ug/kg/min via INTRAVENOUS

## 2016-04-09 MED ORDER — CEFAZOLIN SODIUM-DEXTROSE 2-4 GM/100ML-% IV SOLN
INTRAVENOUS | Status: AC
  Filled 2016-04-09: qty 100

## 2016-04-09 MED ORDER — ONDANSETRON HCL 4 MG/2ML IJ SOLN
INTRAMUSCULAR | Status: AC
  Filled 2016-04-09: qty 2

## 2016-04-09 MED ORDER — MIDAZOLAM HCL 2 MG/2ML IJ SOLN
INTRAMUSCULAR | Status: AC
  Filled 2016-04-09: qty 2

## 2016-04-09 MED ORDER — SUCCINYLCHOLINE CHLORIDE 200 MG/10ML IV SOSY
PREFILLED_SYRINGE | INTRAVENOUS | Status: AC
  Filled 2016-04-09: qty 20

## 2016-04-09 MED ORDER — PROPOFOL 10 MG/ML IV BOLUS
INTRAVENOUS | Status: DC | PRN
  Administered 2016-04-09: 50 mg via INTRAVENOUS

## 2016-04-09 SURGICAL SUPPLY — 111 items
BANDAGE ACE 3X5.8 VEL STRL LF (GAUZE/BANDAGES/DRESSINGS) ×12 IMPLANT
BANDAGE ACE 4X5 VEL STRL LF (GAUZE/BANDAGES/DRESSINGS) ×18 IMPLANT
BANDAGE ACE 6X5 VEL STRL LF (GAUZE/BANDAGES/DRESSINGS) ×18 IMPLANT
BANDAGE COBAN STERILE 2 (GAUZE/BANDAGES/DRESSINGS) ×6 IMPLANT
BANDAGE ELASTIC 3 VELCRO ST LF (GAUZE/BANDAGES/DRESSINGS) IMPLANT
BANDAGE ESMARK 6X9 LF (GAUZE/BANDAGES/DRESSINGS) IMPLANT
BAR GLASS FIBER EXFX 11X150 (EXFIX) ×12 IMPLANT
BAR GLASS FIBER EXFX 11X300 (EXFIX) ×6 IMPLANT
BAR GLASS FIBER EXFX 11X350 (EXFIX) ×6 IMPLANT
BENZOIN TINCTURE PRP APPL 2/3 (GAUZE/BANDAGES/DRESSINGS) IMPLANT
BLADE SURG 10 STRL SS (BLADE) IMPLANT
BLADE SURG ROTATE 9660 (MISCELLANEOUS) ×6 IMPLANT
BNDG COHESIVE 4X5 TAN STRL (GAUZE/BANDAGES/DRESSINGS) IMPLANT
BNDG COHESIVE 6X5 TAN STRL LF (GAUZE/BANDAGES/DRESSINGS) IMPLANT
BNDG ESMARK 6X9 LF (GAUZE/BANDAGES/DRESSINGS)
BNDG GAUZE ELAST 4 BULKY (GAUZE/BANDAGES/DRESSINGS) ×12 IMPLANT
BNDG GAUZE STRTCH 6 (GAUZE/BANDAGES/DRESSINGS) IMPLANT
BONE CANC CHIPS 20CC PCAN1/4 (Bone Implant) IMPLANT
BONE CEMENT PALACOS R-G (Orthopedic Implant) ×6 IMPLANT
BOWL SMART MIX CTS (DISPOSABLE) ×6 IMPLANT
BRUSH SCRUB DISP (MISCELLANEOUS) ×12 IMPLANT
CANISTER WOUND CARE 500ML ATS (WOUND CARE) ×12 IMPLANT
CEMENT BONE PALACOS R-G (Orthopedic Implant) ×4 IMPLANT
CHIPS CANC BONE 20CC PCAN1/4 (Bone Implant) IMPLANT
CLAMP BLUE BAR TO BAR (EXFIX) ×12 IMPLANT
CLAMP BLUE BAR TO PIN (EXFIX) ×24 IMPLANT
CLEANER TIP ELECTROSURG 2X2 (MISCELLANEOUS) IMPLANT
CLOSURE WOUND 1/2 X4 (GAUZE/BANDAGES/DRESSINGS)
COVER SURGICAL LIGHT HANDLE (MISCELLANEOUS) ×6 IMPLANT
CUFF TOURNIQUET SINGLE 18IN (TOURNIQUET CUFF) IMPLANT
CUFF TOURNIQUET SINGLE 24IN (TOURNIQUET CUFF) IMPLANT
CUFF TOURNIQUET SINGLE 34IN LL (TOURNIQUET CUFF) ×12 IMPLANT
DRAPE C-ARM 42X72 X-RAY (DRAPES) ×6 IMPLANT
DRAPE C-ARMOR (DRAPES) ×12 IMPLANT
DRAPE U-SHAPE 47X51 STRL (DRAPES) ×12 IMPLANT
DRSG ADAPTIC 3X8 NADH LF (GAUZE/BANDAGES/DRESSINGS) ×12 IMPLANT
DRSG EMULSION OIL 3X3 NADH (GAUZE/BANDAGES/DRESSINGS) IMPLANT
DRSG MEPITEL 4X7.2 (GAUZE/BANDAGES/DRESSINGS) ×12 IMPLANT
DRSG PAD ABDOMINAL 8X10 ST (GAUZE/BANDAGES/DRESSINGS) ×6 IMPLANT
DRSG VAC ATS MED SENSATRAC (GAUZE/BANDAGES/DRESSINGS) ×12 IMPLANT
ELECT CAUTERY BLADE 6.4 (BLADE) IMPLANT
ELECT REM PT RETURN 9FT ADLT (ELECTROSURGICAL) ×6
ELECTRODE REM PT RTRN 9FT ADLT (ELECTROSURGICAL) ×4 IMPLANT
EVACUATOR 1/8 PVC DRAIN (DRAIN) IMPLANT
GAUZE SPONGE 4X4 12PLY STRL (GAUZE/BANDAGES/DRESSINGS) ×18 IMPLANT
GAUZE XEROFORM 1X8 LF (GAUZE/BANDAGES/DRESSINGS) IMPLANT
GLOVE BIO SURGEON STRL SZ7.5 (GLOVE) ×6 IMPLANT
GLOVE BIO SURGEON STRL SZ8 (GLOVE) ×12 IMPLANT
GLOVE BIOGEL PI IND STRL 7.5 (GLOVE) ×8 IMPLANT
GLOVE BIOGEL PI IND STRL 8 (GLOVE) ×16 IMPLANT
GLOVE BIOGEL PI INDICATOR 7.5 (GLOVE) ×4
GLOVE BIOGEL PI INDICATOR 8 (GLOVE) ×8
GLOVE XGUARD RR 2 7.5 (GLOVE) ×8 IMPLANT
GLOVE XGUARD RR2 7.5 (GLOVE) ×4
GOWN STRL REUS W/ TWL LRG LVL3 (GOWN DISPOSABLE) ×20 IMPLANT
GOWN STRL REUS W/ TWL XL LVL3 (GOWN DISPOSABLE) ×4 IMPLANT
GOWN STRL REUS W/TWL LRG LVL3 (GOWN DISPOSABLE) ×10
GOWN STRL REUS W/TWL XL LVL3 (GOWN DISPOSABLE) ×2
HALF PIN 4MM (EXFIX) ×12 IMPLANT
HANDPIECE INTERPULSE COAX TIP (DISPOSABLE)
K-WIRE THREADED .062X9 (WIRE) ×12
K-WIRE THREADED .094X9 (WIRE) ×6 IMPLANT
K-WIRE THREADED .142X9 (WIRE) ×48 IMPLANT
KIT BASIN OR (CUSTOM PROCEDURE TRAY) ×6 IMPLANT
KIT ROOM TURNOVER OR (KITS) ×6 IMPLANT
KWIRE THREADED .062X9 (WIRE) ×8 IMPLANT
MANIFOLD NEPTUNE II (INSTRUMENTS) ×6 IMPLANT
NEEDLE 22X1 1/2 (OR ONLY) (NEEDLE) IMPLANT
NS IRRIG 1000ML POUR BTL (IV SOLUTION) ×6 IMPLANT
PACK ORTHO EXTREMITY (CUSTOM PROCEDURE TRAY) ×6 IMPLANT
PAD ARMBOARD 7.5X6 YLW CONV (MISCELLANEOUS) ×12 IMPLANT
PAD CAST 3X4 CTTN HI CHSV (CAST SUPPLIES) ×8 IMPLANT
PAD CAST 4YDX4 CTTN HI CHSV (CAST SUPPLIES) ×8 IMPLANT
PADDING CAST COTTON 3X4 STRL (CAST SUPPLIES) ×4
PADDING CAST COTTON 4X4 STRL (CAST SUPPLIES) ×4
PADDING CAST COTTON 6X4 STRL (CAST SUPPLIES) ×18 IMPLANT
PIN 3MM (EXFIX) ×6 IMPLANT
PIN CLAMP 2BAR 75MM BLUE (EXFIX) ×6 IMPLANT
PIN HALF 2.5MM 120X25MM EXFIX (EXFIX) ×6 IMPLANT
PIN HALF YELLOW 5X160X35 (EXFIX) ×12 IMPLANT
PIN TRANSFIXING 5.0 (EXFIX) ×6 IMPLANT
SET HNDPC FAN SPRY TIP SCT (DISPOSABLE) IMPLANT
SPONGE LAP 18X18 X RAY DECT (DISPOSABLE) ×6 IMPLANT
SPONGE SCRUB IODOPHOR (GAUZE/BANDAGES/DRESSINGS) ×12 IMPLANT
STAPLER VISISTAT 35W (STAPLE) ×6 IMPLANT
STOCKINETTE IMPERVIOUS 9X36 MD (GAUZE/BANDAGES/DRESSINGS) IMPLANT
STOCKINETTE IMPERVIOUS LG (DRAPES) IMPLANT
STRIP CLOSURE SKIN 1/2X4 (GAUZE/BANDAGES/DRESSINGS) IMPLANT
SUCTION FRAZIER HANDLE 10FR (MISCELLANEOUS) ×2
SUCTION TUBE FRAZIER 10FR DISP (MISCELLANEOUS) ×4 IMPLANT
SUT ETHILON 2 0 PSLX (SUTURE) ×18 IMPLANT
SUT ETHILON 3 0 PS 1 (SUTURE) ×24 IMPLANT
SUT ETHILON 4 0 P 3 18 (SUTURE) IMPLANT
SUT ETHILON 5 0 P 3 18 (SUTURE)
SUT NYLON ETHILON 5-0 P-3 1X18 (SUTURE) IMPLANT
SUT PDS AB 1 CT  36 (SUTURE)
SUT PDS AB 1 CT 36 (SUTURE) IMPLANT
SUT PDS AB 2-0 CT1 27 (SUTURE) ×18 IMPLANT
SUT PROLENE 4 0 P 3 18 (SUTURE) IMPLANT
SUT VIC AB 0 CT1 27 (SUTURE)
SUT VIC AB 0 CT1 27XBRD ANBCTR (SUTURE) IMPLANT
SUT VIC AB 2-0 CT1 27 (SUTURE) ×2
SUT VIC AB 2-0 CT1 TAPERPNT 27 (SUTURE) ×4 IMPLANT
SYR CONTROL 10ML LL (SYRINGE) IMPLANT
TOWEL OR 17X24 6PK STRL BLUE (TOWEL DISPOSABLE) ×12 IMPLANT
TOWEL OR 17X26 10 PK STRL BLUE (TOWEL DISPOSABLE) ×18 IMPLANT
TUBE ANAEROBIC SPECIMEN COL (MISCELLANEOUS) IMPLANT
TUBE CONNECTING 12'X1/4 (SUCTIONS) ×1
TUBE CONNECTING 12X1/4 (SUCTIONS) ×5 IMPLANT
UNDERPAD 30X30 (UNDERPADS AND DIAPERS) ×18 IMPLANT
YANKAUER SUCT BULB TIP NO VENT (SUCTIONS) ×6 IMPLANT
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
## 2016-04-09 NOTE — Transfer of Care (Signed)
Immediate Anesthesia Transfer of Care Note  Patient: [name]  Procedure(s) Performed: Procedure(s): IRRIGATION AND DEBRIDEMENT EXTREMITY (Bilateral) EXTERNAL FIXATION LEG (Left) OPEN REDUCTION INTERNAL FIXATION (ORIF) CALCANEOUS FRACTURE (Right) APPLICATION OF WOUND VAC (Left)  Patient Location: ICU  Anesthesia Type:General  Level of Consciousness: Patient remains intubated per anesthesia plan  Airway & Oxygen Therapy: Patient remains intubated per anesthesia plan and Patient placed on Ventilator (see vital sign flow sheet for setting)  Post-op Assessment: Report given to RN and Post -op Vital signs reviewed and stable  Post vital signs: Reviewed and stable  Last Vitals:  Vitals:   04/09/16 1419 04/09/16 1435  BP:    Pulse: 81 86  Resp: (!) 21 (!) 24  Temp: 37.3 C 37.3 C    Last Pain:  Vitals:   04/08/16 1334  TempSrc: Core (Comment)         Complications: No apparent anesthesia complications

## 2016-04-09 NOTE — Brief Op Note (Signed)
04/05/2016 - 04/09/2016  8:39 PM  PATIENT:  [name]  55 y.o. male  PRE-OPERATIVE DIAGNOSIS:   1. RIGHT ANKLE DISLOCATION, OPEN 2. RIGHT TALUS FRACTURE, OPEN 3. RIGHT SUBTALAR DISLOCATION, OPEN 4. RIGHT CALCANEUS FRACTURE, OPEN 5. LEFT ANKLE DISLOCATION 6. LEFT TALUS FRACTURE 7. LEFT SUBTALAR DISLOCATION 8. LEFT CALCANEUS FRACTURE 9. LEFT NAVICULAR FRACTURE DISLOCATION  POST-OPERATIVE DIAGNOSIS:  1. RIGHT ANKLE DISLOCATION, OPEN 2. RIGHT TALUS FRACTURE, OPEN 3. RIGHT SUBTALAR DISLOCATION, OPEN 4. RIGHT CALCANEUS FRACTURE, OPEN 5. LEFT ANKLE DISLOCATION 6. LEFT TALUS FRACTURE 7. LEFT SUBTALAR DISLOCATION 8. LEFT CALCANEUS FRACTURE 9. LEFT NAVICULAR FRACTURE DISLOCATION  PROCEDURE:  Procedure(s): 1. OPEN REDUCTION INTERNAL FIXATION (ORIF) RIGHT ANKLE DISLOCATION, OPEN 2. OPEN REDUCTION INTERNAL FIXATION (ORIF) RIGHT TALUS FRACTURE, OPEN 3. OPEN REDUCTION INTERNAL FIXATION (ORIF) RIGHT SUBTALAR DISLOCATION, OPEN 4. OPEN REDUCTION INTERNAL FIXATION (ORIF) RIGHT CALCANEUS FRACTURE, OPEN 5. OPEN REDUCTION INTERNAL FIXATION (ORIF) LEFT ANKLE DISLOCATION 6. OPEN REDUCTION INTERNAL FIXATION (ORIF) LEFT TALUS FRACTURE 7. OPEN REDUCTION INTERNAL FIXATION (ORIF) LEFT SUBTALAR DISLOCATION 8. OPEN REDUCTION INTERNAL FIXATION (ORIF) LEFT CALCANEUS FRACTURE 9. IRRIGATION AND DEBRIDEMENT EXTREMITY RIGHT OPEN TALUS AND CALCANEUS WITH REMOVAL OF SKIN, SUBCU, FASCIA, AND BONE 10. CLOSED TREATMENT OF LEFT NAVICULAR FRACTURE DISLOCATION 11. APPLICATION OF EXTERNAL FIXATION LEG (Left) WITH MONOPLANAR FRAME TO LEG 12. APPLICATION OF EXTERNAL FIXATION LEG (Left) WITH MONOPLANAR FRAME TO FOOT 13. APPLICATION OF WOUND VAC (Left)  SURGEON:  Surgeon(s) and Role:    * Altamese Embden, MD - Primary  PHYSICIAN ASSISTANT: [name], PAC  ANESTHESIA:   general  EBL:  Total I/O In: 1700 [I.V.:1700] Out: 150 [Urine:100; Blood:50]  BLOOD ADMINISTERED:none  DRAINS: none   LOCAL MEDICATIONS  USED:  NONE  SPECIMEN:  No Specimen  DISPOSITION OF SPECIMEN:  N/A  COUNTS:  YES  TOURNIQUET:    DICTATION: .Other Dictation: Dictation Number [id number]  PLAN OF CARE: Admit to inpatient   PATIENT DISPOSITION:  ICU - intubated and hemodynamically stable.   Delay start of Pharmacological VTE agent (>24hrs) due to surgical blood loss or risk of bleeding: no
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
## 2016-04-09 NOTE — Anesthesia Preprocedure Evaluation (Signed)
Anesthesia Evaluation  Patient identified by MRN, date of birth, ID band Patient awake    Reviewed: Allergy & Precautions, NPO status , Patient's Chart, lab work & pertinent test results  Airway Mallampati: Intubated  TM Distance: >3 FB Neck ROM: Full    Dental no notable dental hx.    Pulmonary neg pulmonary ROS,    Pulmonary exam normal breath sounds clear to auscultation       Cardiovascular negative cardio ROS Normal cardiovascular exam Rhythm:Regular Rate:Normal     Neuro/Psych negative neurological ROS  negative psych ROS   GI/Hepatic negative GI ROS, Neg liver ROS,   Endo/Other  negative endocrine ROS  Renal/GU negative Renal ROS  negative genitourinary   Musculoskeletal negative musculoskeletal ROS (+)   Abdominal   Peds negative pediatric ROS (+)  Hematology negative hematology ROS (+)   Anesthesia Other Findings   Reproductive/Obstetrics negative OB ROS                             Anesthesia Physical Anesthesia Plan  ASA: II  Anesthesia Plan: General   Post-op Pain Management:    Induction: Inhalational  Airway Management Planned: Oral ETT  Additional Equipment:   Intra-op Plan:   Post-operative Plan: Post-operative intubation/ventilation  Informed Consent: I have reviewed the patients History and Physical, chart, labs and discussed the procedure including the risks, benefits and alternatives for the proposed anesthesia with the patient or authorized representative who has indicated his/her understanding and acceptance.   Dental advisory given  Plan Discussed with: CRNA and Surgeon  Anesthesia Plan Comments:         Anesthesia Quick Evaluation

## 2016-04-10 ENCOUNTER — Encounter (HOSPITAL_COMMUNITY): Payer: Self-pay | Admitting: Orthopedic Surgery

## 2016-04-10 ENCOUNTER — Inpatient Hospital Stay (HOSPITAL_COMMUNITY): Payer: No Typology Code available for payment source

## 2016-04-10 LAB — BASIC METABOLIC PANEL
Anion gap: 6 (ref 5–15)
BUN: 14 mg/dL (ref 6–20)
CO2: 25 mmol/L (ref 22–32)
Calcium: 7.8 mg/dL — ABNORMAL LOW (ref 8.9–10.3)
Chloride: 112 mmol/L — ABNORMAL HIGH (ref 101–111)
Creatinine, Ser: 0.57 mg/dL — ABNORMAL LOW (ref 0.61–1.24)
GFR calc Af Amer: >60 mL/min (ref >60)
GFR calc non Af Amer: >60 mL/min (ref >60)
Glucose, Bld: 141 mg/dL — ABNORMAL HIGH (ref 65–99)
Potassium: 3.8 mmol/L (ref 3.5–5.1)
Sodium: 143 mmol/L (ref 135–145)

## 2016-04-10 LAB — GLUCOSE, CAPILLARY
Glucose-Capillary: 131 mg/dL — ABNORMAL HIGH (ref 65–99)
Glucose-Capillary: 131 mg/dL — ABNORMAL HIGH (ref 65–99)
Glucose-Capillary: 147 mg/dL — ABNORMAL HIGH (ref 65–99)
Glucose-Capillary: 182 mg/dL — ABNORMAL HIGH (ref 65–99)
Glucose-Capillary: 174 mg/dL — ABNORMAL HIGH (ref 65–99)
Glucose-Capillary: 167 mg/dL — ABNORMAL HIGH (ref 65–99)

## 2016-04-10 LAB — POCT I-STAT 7, (LYTES, BLD GAS, ICA,H+H)
Acid-base deficit: 2 mmol/L (ref 0.0–2.0)
Bicarbonate: 23.6 mmol/L (ref 20.0–28.0)
Bicarbonate: 26.1 mmol/L (ref 20.0–28.0)
Calcium, Ion: 1.09 mmol/L — ABNORMAL LOW (ref 1.15–1.40)
Calcium, Ion: 1.12 mmol/L — ABNORMAL LOW (ref 1.15–1.40)
HCT: 21 % — ABNORMAL LOW (ref 39.0–52.0)
HCT: 23 % — ABNORMAL LOW (ref 39.0–52.0)
Hemoglobin: 7.1 g/dL — ABNORMAL LOW (ref 13.0–17.0)
Hemoglobin: 7.8 g/dL — ABNORMAL LOW (ref 13.0–17.0)
O2 Saturation: 94 %
O2 Saturation: 95 %
Patient temperature: 35.8
Patient temperature: 35.8
Potassium: 4.1 mmol/L (ref 3.5–5.1)
Potassium: 4.1 mmol/L (ref 3.5–5.1)
Sodium: 141 mmol/L (ref 135–145)
Sodium: 143 mmol/L (ref 135–145)
TCO2: 25 mmol/L (ref 0–100)
TCO2: 28 mmol/L (ref 0–100)
pCO2 arterial: 40.9 mmHg (ref 32.0–48.0)
pCO2 arterial: 47 mmHg (ref 32.0–48.0)
pH, Arterial: 7.347 — ABNORMAL LOW (ref 7.350–7.450)
pH, Arterial: 7.364 (ref 7.350–7.450)
pO2, Arterial: 70 mmHg — ABNORMAL LOW (ref 83.0–108.0)
pO2, Arterial: 72 mmHg — ABNORMAL LOW (ref 83.0–108.0)

## 2016-04-10 LAB — CBC
HCT: 28.2 % — ABNORMAL LOW (ref 39.0–52.0)
Hemoglobin: 9.2 g/dL — ABNORMAL LOW (ref 13.0–17.0)
MCH: 29.4 pg (ref 26.0–34.0)
MCHC: 32.6 g/dL (ref 30.0–36.0)
MCV: 90.1 fL (ref 78.0–100.0)
Platelets: 138 10*3/uL — ABNORMAL LOW (ref 150–400)
RBC: 3.13 MIL/uL — ABNORMAL LOW (ref 4.22–5.81)
RDW: 15.8 % — ABNORMAL HIGH (ref 11.5–15.5)
WBC: 7.5 10*3/uL (ref 4.0–10.5)

## 2016-04-10 LAB — TYPE AND SCREEN
ABO/RH(D): B POS
Antibody Screen: NEGATIVE
Unit division: 0
Unit division: 0
Unit division: 0
Unit division: 0

## 2016-04-10 LAB — PHOSPHORUS: Phosphorus: 3.1 mg/dL (ref 2.5–4.6)

## 2016-04-10 LAB — MAGNESIUM: Magnesium: 2 mg/dL (ref 1.7–2.4)

## 2016-04-10 MED ORDER — FENTANYL 2500MCG IN NS 250ML (10MCG/ML) PREMIX INFUSION
25.0000 ug/h | INTRAVENOUS | Status: DC
  Administered 2016-04-11: 100 ug/h via INTRAVENOUS
  Administered 2016-04-11 – 2016-04-13 (×6): 200 ug/h via INTRAVENOUS
  Administered 2016-04-14: 250 ug/h via INTRAVENOUS
  Administered 2016-04-14: 200 ug/h via INTRAVENOUS
  Administered 2016-04-15: 250 ug/h via INTRAVENOUS
  Filled 2016-04-10 (×10): qty 250

## 2016-04-10 MED ORDER — FUROSEMIDE 10 MG/ML IJ SOLN
40.0000 mg | Freq: Once | INTRAMUSCULAR | Status: AC
  Administered 2016-04-10: 40 mg via INTRAVENOUS
  Filled 2016-04-10: qty 4

## 2016-04-10 NOTE — Progress Notes (Signed)
Follow up - Trauma and Critical Care  Patient Details:    [name] is an 55 y.o. male.  Lines/tubes : Airway 7.5 mm (Active)  Secured at (cm) 24 cm 04/10/2016  7:15 AM  Measured From Lips 04/10/2016  7:15 AM  Secured Location Right 04/10/2016  7:15 AM  Secured By [employer] 04/10/2016  7:15 AM  Tube Holder Repositioned Yes 04/10/2016  7:15 AM  Cuff Pressure (cm H2O) 24 cm H2O 04/08/2016  3:30 AM  Site Condition Dry 04/10/2016  7:15 AM     PICC Triple Lumen [id number] PICC Right Basilic 43 cm 0 cm (Active)  Indication for Insertion or Continuance of Line Prolonged intravenous therapies 04/10/2016  8:00 AM  Exposed Catheter (cm) 0 cm 04/06/2016 11:54 AM  Site Assessment Clean;Dry;Intact 04/09/2016  8:00 PM  Lumen #1 Status Infusing 04/09/2016  8:00 PM  Lumen #2 Status Infusing 04/09/2016  8:00 PM  Lumen #3 Status Infusing 04/09/2016  8:00 PM  Dressing Type Transparent 04/09/2016  8:00 PM  Dressing Status Clean;Dry;Intact;Antimicrobial disc in place 04/09/2016  8:00 PM  Dressing Change Due 04/13/16 04/09/2016  8:00 AM     Arterial Line 04/05/16 Right Pedal (Active)  Site Assessment Clean;Dry;Intact 04/09/2016  8:00 PM  Line Status Pulsatile blood flow 04/09/2016  8:00 PM  Art Line Waveform Appropriate;Square wave test performed 04/09/2016  8:00 PM  Art Line Interventions Connections checked and tightened;Zeroed and calibrated 04/09/2016  8:00 PM  Color/Movement/Sensation Capillary refill less than 3 sec 04/09/2016  8:00 PM  Dressing Type Transparent 04/09/2016  8:00 PM  Dressing Status Clean;Dry;Intact 04/09/2016  8:00 PM  Dressing Change Due 04/12/16 04/06/2016  8:00 AM     Negative Pressure Wound Therapy Ankle Left;Medial;Lateral (Active)  Site / Wound Assessment Dressing in place / Unable to assess 04/09/2016  8:00 PM  Output (mL) 200 mL 04/10/2016  6:00 AM     Negative Pressure Wound Therapy Ankle Right;Medial;Lateral (Active)  Site / Wound Assessment Dressing in place / Unable to  assess 04/09/2016  8:00 PM  Output (mL) 50 mL 04/10/2016  6:00 AM     NG/OG Tube Orogastric 16 Fr. Center mouth Xray (Active)  Site Assessment Clean;Dry;Intact 04/10/2016  8:00 AM  Ongoing Placement Verification Auscultation 04/10/2016  8:00 AM  Status Clamped 04/10/2016  8:00 AM  Drainage Appearance Clear;Mucus;Tan 04/07/2016  8:00 AM     Urethral Catheter Darla Anastasia Fiedler RN Non-latex;Temperature probe 16 Fr. (Active)  Indication for Insertion or Continuance of Catheter Peri-operative use for selective surgical procedure 04/10/2016  8:00 AM  Site Assessment Clean;Intact 04/09/2016  8:00 PM  Catheter Maintenance Bag below level of bladder;Insertion date on drainage bag;No dependent loops;Seal intact;Drainage bag/tubing not touching floor;Catheter secured 04/10/2016  8:00 AM  Collection Container Standard drainage bag 04/09/2016  8:00 PM  Securement Method Securing device (Describe) 04/09/2016  8:00 PM  Urinary Catheter Interventions Unclamped 04/09/2016  8:00 PM  Output (mL) 100 mL 04/10/2016  8:00 AM    Microbiology/Sepsis markers: Results for orders placed or performed during the hospital encounter of 04/05/16  MRSA PCR Screening     Status: None   Collection Time: 04/05/16 10:19 PM  Result Value Ref Range Status   MRSA by PCR NEGATIVE NEGATIVE Final    Comment:        The GeneXpert MRSA Assay (FDA approved for NASAL specimens only), is one component of a comprehensive MRSA colonization surveillance program. It is not intended to diagnose MRSA infection nor to guide or monitor treatment  for MRSA infections.   Culture, respiratory (NON-Expectorated)     Status: None (Preliminary result)   Collection Time: 04/09/16 11:31 PM  Result Value Ref Range Status   Specimen Description TRACHEAL ASPIRATE  Final   Special Requests NONE  Final   Gram Stain   Final    MODERATE WBC PRESENT,BOTH PMN AND MONONUCLEAR NO ORGANISMS SEEN    Culture PENDING  Incomplete   Report Status PENDING  Incomplete     Anti-infectives:  Anti-infectives    Start     Dose/Rate Route Frequency Ordered Stop   04/09/16 2200  ceFAZolin (ANCEF) IVPB 1 g/50 mL premix     1 g 100 mL/hr over 30 Minutes Intravenous Every 8 hours 04/09/16 2038     04/09/16 1914  ceFAZolin (ANCEF) 2-4 GM/100ML-% IVPB    Comments:  Trixie Deis   : cabinet override      04/09/16 1914 04/10/16 0729   04/09/16 1720  vancomycin (VANCOCIN) powder  Status:  Discontinued       As needed 04/09/16 1721 04/09/16 2013   04/06/16 0600  ceFAZolin (ANCEF) IVPB 1 g/50 mL premix  Status:  Discontinued     1 g 100 mL/hr over 30 Minutes Intravenous Every 8 hours 04/06/16 0206 04/09/16 2038   04/05/16 1729  ceFAZolin (ANCEF) 2-4 GM/100ML-% IVPB    Comments:  Rumbarger, Rachel   : cabinet override      04/05/16 1729 04/05/16 1937      Best Practice/Protocols:  VTE Prophylaxis: Mechanical GI Prophylaxis: Proton Pump Inhibitor Continous Sedation  Consults: Treatment Team:  Newman Pies, MD Altamese Yoakum, MD    Events:  Subjective:    Overnight Issues: Patient did not get weaned yesterday because of surgery.  Objective:  Vital signs for last 24 hours: Temp:  [96.6 F (35.9 C)-100.8 F (38.2 C)] 100.2 F (37.9 C) (10/04 0800) Pulse Rate:  [80-111] 95 (10/04 0800) Resp:  [0-28] 24 (10/04 0800) BP: (110-164)/(62-83) 120/67 (10/04 0800) SpO2:  [94 %-100 %] 96 % (10/04 0800) Arterial Line BP: (120-185)/(51-71) 124/58 (10/04 0800) FiO2 (%):  [40 %] 40 % (10/04 0715) Weight:  [108.9 kg (240 lb 1.3 oz)] 108.9 kg (240 lb 1.3 oz) (10/04 0455)  Hemodynamic parameters for last 24 hours:    Intake/Output from previous day: 10/03 0701 - 10/04 0700 In: 4376.3 [I.V.:3339.7; Blood:736.7; IV Piggyback:300] Out: 2650 [Urine:2150; Drains:250; Blood:250]  Intake/Output this shift: Total I/O In: 100 [I.V.:100] Out: 100 [Urine:100]  Vent settings for last 24 hours: Vent Mode: PRVC FiO2 (%):  [40 %] 40 % Set Rate:  [24 bmp] 24  bmp Vt Set:  [600 mL] 600 mL PEEP:  [5 cmH20] 5 cmH20 Plateau Pressure:  [18 cmH20] 18 cmH20  Physical Exam:  General: no respiratory distress and well sedated Neuro: nonfocal exam, RASS -2 and sedated Resp: clear to auscultation bilaterally and CXR shows LLL atelectasis with possible effusion CVS: regular rate and rhythm, S1, S2 normal, no murmur, click, rub or gallop GI: soft, nontender, BS WNL, no r/g and Tube feedings to be restarted Extremities: unequal size and RUE edematous in the forearm.  PICC line on the right.  Results for orders placed or performed during the hospital encounter of 04/05/16 (from the past 24 hour(s))  Glucose, capillary     Status: Abnormal   Collection Time: 04/09/16  9:06 AM  Result Value Ref Range   Glucose-Capillary 143 (H) 65 - 99 mg/dL   Comment 1 Notify RN    Comment  2 Document in Chart   Glucose, capillary     Status: Abnormal   Collection Time: 04/09/16 12:13 PM  Result Value Ref Range   Glucose-Capillary 127 (H) 65 - 99 mg/dL   Comment 1 Notify RN    Comment 2 Document in Chart   I-STAT 7, (LYTES, BLD GAS, ICA, H+H)     Status: Abnormal   Collection Time: 04/09/16  5:45 PM  Result Value Ref Range   pH, Arterial 7.347 (L) 7.350 - 7.450   pCO2 arterial 47.0 32.0 - 48.0 mmHg   pO2, Arterial 70.0 (L) 83.0 - 108.0 mmHg   Bicarbonate 26.1 20.0 - 28.0 mmol/L   TCO2 28 0 - 100 mmol/L   O2 Saturation 94.0 %   Sodium 143 135 - 145 mmol/L   Potassium 4.1 3.5 - 5.1 mmol/L   Calcium, Ion 1.12 (L) 1.15 - 1.40 mmol/L   HCT 23.0 (L) 39.0 - 52.0 %   Hemoglobin 7.8 (L) 13.0 - 17.0 g/dL   Patient temperature 35.8 C    Sample type ARTERIAL   I-STAT 7, (LYTES, BLD GAS, ICA, H+H)     Status: Abnormal   Collection Time: 04/09/16  5:55 PM  Result Value Ref Range   pH, Arterial 7.364 7.350 - 7.450   pCO2 arterial 40.9 32.0 - 48.0 mmHg   pO2, Arterial 72.0 (L) 83.0 - 108.0 mmHg   Bicarbonate 23.6 20.0 - 28.0 mmol/L   TCO2 25 0 - 100 mmol/L   O2 Saturation  95.0 %   Acid-base deficit 2.0 0.0 - 2.0 mmol/L   Sodium 141 135 - 145 mmol/L   Potassium 4.1 3.5 - 5.1 mmol/L   Calcium, Ion 1.09 (L) 1.15 - 1.40 mmol/L   HCT 21.0 (L) 39.0 - 52.0 %   Hemoglobin 7.1 (L) 13.0 - 17.0 g/dL   Patient temperature 35.8 C    Sample type ARTERIAL   Glucose, capillary     Status: Abnormal   Collection Time: 04/09/16  8:25 PM  Result Value Ref Range   Glucose-Capillary 119 (H) 65 - 99 mg/dL  Glucose, capillary     Status: Abnormal   Collection Time: 04/09/16 11:20 PM  Result Value Ref Range   Glucose-Capillary 133 (H) 65 - 99 mg/dL  Culture, respiratory (NON-Expectorated)     Status: None (Preliminary result)   Collection Time: 04/09/16 11:31 PM  Result Value Ref Range   Specimen Description TRACHEAL ASPIRATE    Special Requests NONE    Gram Stain      MODERATE WBC PRESENT,BOTH PMN AND MONONUCLEAR NO ORGANISMS SEEN    Culture PENDING    Report Status PENDING   Provider-confirm verbal Blood Bank order - Type & Screen, RBC, FFP; 4 Units; Order taken: 04/05/2016; 5:14 PM; Level 1 Trauma, Emergency Release     Status: None   Collection Time: 04/09/16 11:59 PM  Result Value Ref Range   Blood product order confirm MD AUTHORIZATION REQUESTED   Glucose, capillary     Status: Abnormal   Collection Time: 04/10/16  3:58 AM  Result Value Ref Range   Glucose-Capillary 131 (H) 65 - 99 mg/dL  CBC     Status: Abnormal   Collection Time: 04/10/16  5:50 AM  Result Value Ref Range   WBC 7.5 4.0 - 10.5 K/uL   RBC 3.13 (L) 4.22 - 5.81 MIL/uL   Hemoglobin 9.2 (L) 13.0 - 17.0 g/dL   HCT 28.2 (L) 39.0 - 52.0 %   MCV 90.1 78.0 -  100.0 fL   MCH 29.4 26.0 - 34.0 pg   MCHC 32.6 30.0 - 36.0 g/dL   RDW 15.8 (H) 11.5 - 15.5 %   Platelets 138 (L) 150 - 400 K/uL  Basic metabolic panel     Status: Abnormal   Collection Time: 04/10/16  5:50 AM  Result Value Ref Range   Sodium 143 135 - 145 mmol/L   Potassium 3.8 3.5 - 5.1 mmol/L   Chloride 112 (H) 101 - 111 mmol/L   CO2 25  22 - 32 mmol/L   Glucose, Bld 141 (H) 65 - 99 mg/dL   BUN 14 6 - 20 mg/dL   Creatinine, Ser 0.57 (L) 0.61 - 1.24 mg/dL   Calcium 7.8 (L) 8.9 - 10.3 mg/dL   GFR calc non Af Amer >60 >60 mL/min   GFR calc Af Amer >60 >60 mL/min   Anion gap 6 5 - 15  Magnesium     Status: None   Collection Time: 04/10/16  5:50 AM  Result Value Ref Range   Magnesium 2.0 1.7 - 2.4 mg/dL  Phosphorus     Status: None   Collection Time: 04/10/16  5:50 AM  Result Value Ref Range   Phosphorus 3.1 2.5 - 4.6 mg/dL     Assessment/Plan:   NEURO  Altered Mental Status:  sedation   Plan: Wean sedation as we move towards weaning the ventilator  PULM  Atelectasis/collapse (focal and LLL)   Plan: Wean ventilator as possible  CARDIO  No specific issues   Plan: CPM  RENAL  Hypervolemia Ten liters ahead since admission   Plan: Lasix x 1, then reassess volume satus  GI  Tube feedings stopped for surgery and not restarted.  Will restart this AM.   Plan: Restart tube feedings.  ID  No known infectious source   Plan: CPM.  Getting prophylactic antibiotics  HEME  Anemia acute blood loss anemia)   Plan: Hemoglobin high enough not to require transfusion  ENDO No specific issues   Plan: CPM  Global Issues  Try to wean sedation for extubation later today.  No more surgery planned in the near future.    LOS: 5 days   Additional comments:I reviewed the patient's new clinical lab test results. cbc/bmet and I reviewed the patients new imaging test results. cxr  Critical Care Total Time*: 9 Minutes  Leondre Taul 04/10/2016  *Care during the described time interval was provided by me and/or other providers on the critical care team.  I have reviewed this patient's available data, including medical history, events of note, physical examination and test results as part of my evaluation.

## 2016-04-10 NOTE — Consult Note (Signed)
[facility] Progress Note  Subjective  Intubated and sedated   Review of Systems  Unable to perform ROS: Intubated     Objective   BP 120/67 (BP Location: Left Arm)   Pulse 95   Temp 100.2 F (37.9 C)   Resp (!) 24   Ht _0  (1.803 m)   Wt 108.9 kg (240 lb 1.3 oz)   SpO2 96%   BMI 33.48 kg/m   Intake/Output      10/03 0701 - 10/04 0700 10/04 0701 - 10/05 0700   I.V. (mL/kg) 3339.7 (30.7) 100 (0.9)   Blood 736.7    NG/GT     IV Piggyback 300    Total Intake(mL/kg) 4376.3 (40.2) 100 (0.9)   Urine (mL/kg/hr) 2150 (0.8) 100 (0.6)   Drains 250 (0.1)    Blood 250 (0.1)    Total Output 2650 100   Net +1726.3 0          Labs  Results for [name], [name] (MRN [id number]) as of 04/10/2016 08:35  Ref. Range 04/10/2016 05:50  Sodium Latest Ref Range: 135 - 145 mmol/L 143  Potassium Latest Ref Range: 3.5 - 5.1 mmol/L 3.8  Chloride Latest Ref Range: 101 - 111 mmol/L 112 (H)  CO2 Latest Ref Range: 22 - 32 mmol/L 25  BUN Latest Ref Range: 6 - 20 mg/dL 14  Creatinine Latest Ref Range: 0.61 - 1.24 mg/dL 0.57 (L)  Calcium Latest Ref Range: 8.9 - 10.3 mg/dL 7.8 (L)  EGFR (Non-African Amer.) Latest Ref Range: >60 mL/min >60  EGFR (African American) Latest Ref Range: >60 mL/min >60  Glucose Latest Ref Range: 65 - 99 mg/dL 141 (H)  Anion gap Latest Ref Range: 5 - 15  6  Phosphorus Latest Ref Range: 2.5 - 4.6 mg/dL 3.1  Magnesium Latest Ref Range: 1.7 - 2.4 mg/dL 2.0  WBC Latest Ref Range: 4.0 - 10.5 K/uL 7.5  RBC Latest Ref Range: 4.22 - 5.81 MIL/uL 3.13 (L)  Hemoglobin Latest Ref Range: 13.0 - 17.0 g/dL 9.2 (L)  HCT Latest Ref Range: 39.0 - 52.0 % 28.2 (L)  MCV Latest Ref Range: 78.0 - 100.0 fL 90.1  MCH Latest Ref Range: 26.0 - 34.0 pg 29.4  MCHC Latest Ref Range: 30.0 - 36.0 g/dL 32.6  RDW Latest Ref Range: 11.5 - 15.5 % 15.8 (H)  Platelets Latest Ref Range: 150 - 400 K/uL 138 (L)    Exam  Gen: intubated and sedated Ext:       Bilateral lower  extremities  Extremities are warm  + DP pulse  Swelling stable  VACs functioning      Assessment and Plan   POD/HD#: 1  55 y/o white male s/p [facility]   - [facility]   -bilateral talo-calcaneal fracture dislocations and L talonavicular dislocation,              s/p I&D, open reduction, pinning R ankle/subtalar joint, Ex fix Left ankle and pinning subtalar joint and tibiotalarjoint    Return to OR Thursday or Friday for advancement of soft tissue  Fusion vs amputation at later date unless soft tissue/physiology dictates otherwise   Aggressive ice and elevation                 - C spine fracture              Per NS   - Pain management:             Per TS   -  ABL anemia/Hemodynamics            stable      - DVT/PE prophylaxis:             Lovenox once CBC stabilized      - Dispo:             OR tomorrow or Thursday    Jari Pigg, PA-C [facility] [phone] (O) 04/10/2016 8:33 AM
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
## 2016-04-11 ENCOUNTER — Inpatient Hospital Stay (HOSPITAL_COMMUNITY): Payer: No Typology Code available for payment source | Admitting: Certified Registered Nurse Anesthetist

## 2016-04-11 ENCOUNTER — Encounter (HOSPITAL_COMMUNITY): Admission: EM | Disposition: A | Discharge status: Rehabilitation Facility | Payer: Self-pay | Source: Home / Self Care

## 2016-04-11 ENCOUNTER — Inpatient Hospital Stay (HOSPITAL_COMMUNITY): Payer: No Typology Code available for payment source

## 2016-04-11 ENCOUNTER — Encounter (HOSPITAL_COMMUNITY): Payer: Self-pay | Admitting: Anesthesiology

## 2016-04-11 HISTORY — PX: I&D EXTREMITY: SHX5045

## 2016-04-11 LAB — HEMOGLOBIN A1C
Hgb A1c MFr Bld: 6.1 % — ABNORMAL HIGH (ref 4.8–5.6)
Mean Plasma Glucose: 128 mg/dL

## 2016-04-11 LAB — CBC WITH DIFFERENTIAL/PLATELET
Basophils Absolute: 0 10*3/uL (ref 0.0–0.1)
Basophils Relative: 0 %
Eosinophils Absolute: 0.1 10*3/uL (ref 0.0–0.7)
Eosinophils Relative: 1 %
HCT: 28.9 % — ABNORMAL LOW (ref 39.0–52.0)
Hemoglobin: 9.4 g/dL — ABNORMAL LOW (ref 13.0–17.0)
Lymphocytes Relative: 12 %
Lymphs Abs: 1.2 10*3/uL (ref 0.7–4.0)
MCH: 29.7 pg (ref 26.0–34.0)
MCHC: 32.5 g/dL (ref 30.0–36.0)
MCV: 91.5 fL (ref 78.0–100.0)
Monocytes Absolute: 1.2 10*3/uL — ABNORMAL HIGH (ref 0.1–1.0)
Monocytes Relative: 13 %
Neutro Abs: 7.1 10*3/uL (ref 1.7–7.7)
Neutrophils Relative %: 74 %
Platelets: 186 10*3/uL (ref 150–400)
RBC: 3.16 MIL/uL — ABNORMAL LOW (ref 4.22–5.81)
RDW: 15.1 % (ref 11.5–15.5)
WBC: 9.6 10*3/uL (ref 4.0–10.5)

## 2016-04-11 LAB — GLUCOSE, CAPILLARY
Glucose-Capillary: 133 mg/dL — ABNORMAL HIGH (ref 65–99)
Glucose-Capillary: 139 mg/dL — ABNORMAL HIGH (ref 65–99)
Glucose-Capillary: 136 mg/dL — ABNORMAL HIGH (ref 65–99)
Glucose-Capillary: 126 mg/dL — ABNORMAL HIGH (ref 65–99)
Glucose-Capillary: 121 mg/dL — ABNORMAL HIGH (ref 65–99)

## 2016-04-11 LAB — BASIC METABOLIC PANEL
Anion gap: 14 (ref 5–15)
BUN: 19 mg/dL (ref 6–20)
CO2: 26 mmol/L (ref 22–32)
Calcium: 8.2 mg/dL — ABNORMAL LOW (ref 8.9–10.3)
Chloride: 108 mmol/L (ref 101–111)
Creatinine, Ser: 0.59 mg/dL — ABNORMAL LOW (ref 0.61–1.24)
GFR calc Af Amer: >60 mL/min (ref >60)
GFR calc non Af Amer: >60 mL/min (ref >60)
Glucose, Bld: 136 mg/dL — ABNORMAL HIGH (ref 65–99)
Potassium: 3.6 mmol/L (ref 3.5–5.1)
Sodium: 148 mmol/L — ABNORMAL HIGH (ref 135–145)

## 2016-04-11 SURGERY — IRRIGATION AND DEBRIDEMENT EXTREMITY
Anesthesia: General | Site: Ankle | Laterality: Bilateral

## 2016-04-11 MED ORDER — CEFAZOLIN IN D5W 1 GM/50ML IV SOLN
INTRAVENOUS | Status: DC | PRN
  Administered 2016-04-11: 1 g via INTRAVENOUS

## 2016-04-11 MED ORDER — FENTANYL CITRATE (PF) 100 MCG/2ML IJ SOLN
INTRAMUSCULAR | Status: AC
  Filled 2016-04-11: qty 2

## 2016-04-11 MED ORDER — PROPOFOL 10 MG/ML IV BOLUS
INTRAVENOUS | Status: DC | PRN
  Administered 2016-04-11: 50 mg via INTRAVENOUS

## 2016-04-11 MED ORDER — MIDAZOLAM HCL 2 MG/2ML IJ SOLN
INTRAMUSCULAR | Status: AC
  Filled 2016-04-11: qty 2

## 2016-04-11 MED ORDER — METOPROLOL TARTRATE 25 MG/10 ML ORAL SUSPENSION
25.0000 mg | Freq: Two times a day (BID) | ORAL | Status: DC
  Administered 2016-04-11 – 2016-04-14 (×7): 25 mg
  Filled 2016-04-11 (×7): qty 10

## 2016-04-11 MED ORDER — SODIUM CHLORIDE 0.9 % IV SOLN
INTRAVENOUS | Status: DC | PRN
  Administered 2016-04-11: 200 ug/h via INTRAVENOUS

## 2016-04-11 MED ORDER — CEFAZOLIN SODIUM 1 G IJ SOLR
INTRAMUSCULAR | Status: AC
  Filled 2016-04-11: qty 10

## 2016-04-11 MED ORDER — HYDRALAZINE HCL 20 MG/ML IJ SOLN
5.0000 mg | INTRAMUSCULAR | Status: DC | PRN
  Administered 2016-04-11 – 2016-04-15 (×7): 5 mg via INTRAVENOUS
  Filled 2016-04-11 (×8): qty 1

## 2016-04-11 MED ORDER — METOPROLOL TARTRATE 5 MG/5ML IV SOLN
INTRAVENOUS | Status: AC
  Administered 2016-04-11: 5 mg
  Filled 2016-04-11: qty 5

## 2016-04-11 MED ORDER — SODIUM CHLORIDE 0.9 % IR SOLN
Status: DC | PRN
  Administered 2016-04-11: 1000 mL
  Administered 2016-04-11: 3000 mL

## 2016-04-11 MED ORDER — LACTATED RINGERS IV SOLN
INTRAVENOUS | Status: DC | PRN
  Administered 2016-04-11: 19:00:00 via INTRAVENOUS

## 2016-04-11 MED ORDER — FENTANYL CITRATE (PF) 100 MCG/2ML IJ SOLN
INTRAMUSCULAR | Status: DC | PRN
  Administered 2016-04-11: 100 ug via INTRAVENOUS

## 2016-04-11 SURGICAL SUPPLY — 57 items
BANDAGE ACE 3X5.8 VEL STRL LF (GAUZE/BANDAGES/DRESSINGS) ×3 IMPLANT
BANDAGE ACE 4X5 VEL STRL LF (GAUZE/BANDAGES/DRESSINGS) ×3 IMPLANT
BLADE SURG 10 STRL SS (BLADE) ×3 IMPLANT
BNDG COHESIVE 4X5 TAN STRL (GAUZE/BANDAGES/DRESSINGS) ×3 IMPLANT
BNDG GAUZE ELAST 4 BULKY (GAUZE/BANDAGES/DRESSINGS) ×3 IMPLANT
BNDG GAUZE STRTCH 6 (GAUZE/BANDAGES/DRESSINGS) ×9 IMPLANT
BRUSH SCRUB DISP (MISCELLANEOUS) ×6 IMPLANT
CANISTER WOUND CARE 500ML ATS (WOUND CARE) ×3 IMPLANT
COVER SURGICAL LIGHT HANDLE (MISCELLANEOUS) ×6 IMPLANT
DRAPE U-SHAPE 47X51 STRL (DRAPES) ×3 IMPLANT
DRSG ADAPTIC 3X8 NADH LF (GAUZE/BANDAGES/DRESSINGS) ×3 IMPLANT
DRSG MEPITEL 4X7.2 (GAUZE/BANDAGES/DRESSINGS) ×3 IMPLANT
DRSG PAD ABDOMINAL 8X10 ST (GAUZE/BANDAGES/DRESSINGS) ×3 IMPLANT
DRSG VAC ATS SM SENSATRAC (GAUZE/BANDAGES/DRESSINGS) ×3 IMPLANT
ELECT CAUTERY BLADE 6.4 (BLADE) IMPLANT
ELECT REM PT RETURN 9FT ADLT (ELECTROSURGICAL)
ELECTRODE REM PT RTRN 9FT ADLT (ELECTROSURGICAL) IMPLANT
GAUZE SPONGE 4X4 12PLY STRL (GAUZE/BANDAGES/DRESSINGS) ×3 IMPLANT
GLOVE BIO SURGEON STRL SZ7.5 (GLOVE) ×3 IMPLANT
GLOVE BIO SURGEON STRL SZ8 (GLOVE) ×3 IMPLANT
GLOVE BIOGEL PI IND STRL 7.5 (GLOVE) ×1 IMPLANT
GLOVE BIOGEL PI IND STRL 8 (GLOVE) ×1 IMPLANT
GLOVE BIOGEL PI INDICATOR 7.5 (GLOVE) ×2
GLOVE BIOGEL PI INDICATOR 8 (GLOVE) ×2
GOWN STRL REUS W/ TWL LRG LVL3 (GOWN DISPOSABLE) ×2 IMPLANT
GOWN STRL REUS W/ TWL XL LVL3 (GOWN DISPOSABLE) ×1 IMPLANT
GOWN STRL REUS W/TWL LRG LVL3 (GOWN DISPOSABLE) ×4
GOWN STRL REUS W/TWL XL LVL3 (GOWN DISPOSABLE) ×2
HANDPIECE INTERPULSE COAX TIP (DISPOSABLE)
KIT BASIN OR (CUSTOM PROCEDURE TRAY) ×3 IMPLANT
KIT ROOM TURNOVER OR (KITS) ×3 IMPLANT
MANIFOLD NEPTUNE II (INSTRUMENTS) ×3 IMPLANT
NS IRRIG 1000ML POUR BTL (IV SOLUTION) ×3 IMPLANT
PACK ORTHO EXTREMITY (CUSTOM PROCEDURE TRAY) ×3 IMPLANT
PAD ARMBOARD 7.5X6 YLW CONV (MISCELLANEOUS) ×6 IMPLANT
PAD NEG PRESSURE SENSATRAC (MISCELLANEOUS) ×6 IMPLANT
PADDING CAST ABS 3INX4YD NS (CAST SUPPLIES) ×2
PADDING CAST ABS 4INX4YD NS (CAST SUPPLIES) ×2
PADDING CAST ABS COTTON 3X4 (CAST SUPPLIES) ×1 IMPLANT
PADDING CAST ABS COTTON 4X4 ST (CAST SUPPLIES) ×1 IMPLANT
PADDING CAST COTTON 6X4 STRL (CAST SUPPLIES) ×3 IMPLANT
SET HNDPC FAN SPRY TIP SCT (DISPOSABLE) IMPLANT
SPLINT PLASTER CAST XFAST 5X30 (CAST SUPPLIES) ×1 IMPLANT
SPLINT PLASTER XFAST SET 5X30 (CAST SUPPLIES) ×2
SPONGE LAP 18X18 X RAY DECT (DISPOSABLE) ×3 IMPLANT
STOCKINETTE IMPERVIOUS 9X36 MD (GAUZE/BANDAGES/DRESSINGS) ×3 IMPLANT
SUT ETHILON 3 0 FSL (SUTURE) ×9 IMPLANT
SUT ETHILON 3 0 PS 1 (SUTURE) ×6 IMPLANT
SUT PDS AB 2-0 CT1 27 (SUTURE) IMPLANT
TOWEL OR 17X24 6PK STRL BLUE (TOWEL DISPOSABLE) ×3 IMPLANT
TOWEL OR 17X26 10 PK STRL BLUE (TOWEL DISPOSABLE) ×6 IMPLANT
TUBE ANAEROBIC SPECIMEN COL (MISCELLANEOUS) IMPLANT
TUBE CONNECTING 12'X1/4 (SUCTIONS) ×1
TUBE CONNECTING 12X1/4 (SUCTIONS) ×2 IMPLANT
UNDERPAD 30X30 (UNDERPADS AND DIAPERS) ×3 IMPLANT
WATER STERILE IRR 1000ML POUR (IV SOLUTION) ×3 IMPLANT
YANKAUER SUCT BULB TIP NO VENT (SUCTIONS) ×3 IMPLANT

## 2016-04-11 NOTE — Anesthesia Preprocedure Evaluation (Addendum)
Anesthesia Evaluation  Patient identified by MRN, date of birth, ID band  Reviewed: Allergy & Precautions, NPO status , Patient's Chart, lab work & pertinent test results, Unable to perform ROS - Chart review only  Airway Mallampati: Intubated  TM Distance: >3 FB Neck ROM: Full    Dental no notable dental hx.    Pulmonary neg pulmonary ROS,    Pulmonary exam normal breath sounds clear to auscultation       Cardiovascular negative cardio ROS Normal cardiovascular exam Rhythm:Regular Rate:Normal     Neuro/Psych negative neurological ROS  negative psych ROS   GI/Hepatic negative GI ROS, Neg liver ROS,   Endo/Other  negative endocrine ROS  Renal/GU negative Renal ROS  negative genitourinary   Musculoskeletal negative musculoskeletal ROS (+)   Abdominal   Peds negative pediatric ROS (+)  Hematology negative hematology ROS (+)   Anesthesia Other Findings   Reproductive/Obstetrics negative OB ROS                            Anesthesia Physical  Anesthesia Plan  ASA: II  Anesthesia Plan: General   Post-op Pain Management:    Induction: Inhalational  Airway Management Planned: Oral ETT  Additional Equipment:   Intra-op Plan:   Post-operative Plan: Post-operative intubation/ventilation  Informed Consent: I have reviewed the patients History and Physical, chart, labs and discussed the procedure including the risks, benefits and alternatives for the proposed anesthesia with the patient or authorized representative who has indicated his/her understanding and acceptance.   Dental advisory given  Plan Discussed with: CRNA, Anesthesiologist and Surgeon  Anesthesia Plan Comments: (Discussed with Pts wife who understands and agrees with plan. Will keep pt intubated post op.)      Anesthesia Quick Evaluation

## 2016-04-11 NOTE — Progress Notes (Signed)
RT unable to do ventilator check at this time. Patient is in OR.

## 2016-04-11 NOTE — OR Nursing (Signed)
1st call @2105 

## 2016-04-11 NOTE — Anesthesia Procedure Notes (Signed)
Date/Time: 04/18/2016 7:18 PM Performed by: Eligha Bridegroom Pre-anesthesia Checklist: Patient identified, Emergency Drugs available, Suction available, Patient being monitored and Timeout performed Patient Re-evaluated:Patient Re-evaluated prior to inductionOxygen Delivery Method: Circle system utilized Preoxygenation: Pre-oxygenation with 100% oxygen Intubation Type: IV induction Tube type: Subglottic suction tube Placement Confirmation: breath sounds checked- equal and bilateral

## 2016-04-11 NOTE — Brief Op Note (Signed)
04/05/2016 - 04/11/2016  9:28 PM  PATIENT:  [name]  55 y.o. male  PRE-OPERATIVE DIAGNOSIS:  BILATERAL ANKLE AND SUBTALAR FRACTURE DISLOCATIONS, RIGHT OPEN GRADE 3  POST-OPERATIVE DIAGNOSIS:  BILATERAL ANKLE AND SUBTALAR FRACTURE DISLOCATIONS, RIGHT OPEN GRADE 3  PROCEDURE:  Procedure(s): 1. COMPLEX CLOSURE RIGHT ANKLE OPEN WOUND 10CM WITH RETENTION SUTURES 2. COMPLEX CLOSURE LEFT ANKLE OPEN WOUND 10CM WITH RETENTION SUTURES 3. APPLICATION OF SMALL WOUND VAC RIGHT ANKLE 4. APPLICATION OF SMALL WOUND VAC LEFT ANKLE  SURGEON:  Surgeon(s) and Role:    * Altamese [place], MD - Primary  ASSISTANTS: none   ANESTHESIA:   general  EBL:  Total I/O In: 300 [I.V.:300] Out: 10 [Blood:10]  BLOOD ADMINISTERED:none  DRAINS: BILATERAL WOUND VAC   LOCAL MEDICATIONS USED:  NONE  SPECIMEN:  No Specimen  DISPOSITION OF SPECIMEN:  N/A  COUNTS:  YES  TOURNIQUET:  * No tourniquets in log *  DICTATION: .Other Dictation: Dictation Number [id number]  PLAN OF CARE: Admit to inpatient   PATIENT DISPOSITION:  ICU - intubated and hemodynamically stable.   Delay start of Pharmacological VTE agent (>24hrs) due to surgical blood loss or risk of bleeding: no

## 2016-04-11 NOTE — Progress Notes (Signed)
Follow up - Trauma Critical Care  Patient Details:    [name] is an 55 y.o. male.  Lines/tubes : Airway 7.5 mm (Active)  Secured at (cm) 26 cm 04/11/2016  7:25 AM  Measured From Lips 04/11/2016  7:25 AM  Secured Location Left 04/11/2016  7:25 AM  Secured By [employer] 04/11/2016  7:25 AM  Tube Holder Repositioned Yes 04/11/2016  7:25 AM  Cuff Pressure (cm H2O) 24 cm H2O 04/11/2016  7:25 AM  Site Condition Dry 04/11/2016  7:25 AM     PICC Triple Lumen [id number] PICC Right Basilic 43 cm 0 cm (Active)  Indication for Insertion or Continuance of Line Prolonged intravenous therapies 04/10/2016  8:00 PM  Exposed Catheter (cm) 0 cm 04/06/2016 11:54 AM  Site Assessment Clean;Dry;Intact 04/10/2016  8:00 PM  Lumen #1 Status Infusing 04/10/2016  8:00 PM  Lumen #2 Status Infusing 04/10/2016  8:00 PM  Lumen #3 Status Infusing 04/10/2016  8:00 PM  Dressing Type Transparent 04/10/2016  8:00 PM  Dressing Status Clean;Dry;Intact;Antimicrobial disc in place 04/10/2016  8:00 PM  Dressing Change Due 04/13/16 04/09/2016  8:00 AM     Arterial Line 04/05/16 Right Pedal (Active)  Site Assessment Clean;Dry;Intact 04/10/2016  8:00 PM  Line Status Pulsatile blood flow 04/10/2016  8:00 PM  Art Line Waveform Whip 04/10/2016  8:00 PM  Art Line Interventions Connections checked and tightened;Zeroed and calibrated 04/10/2016  8:00 PM  Color/Movement/Sensation Capillary refill less than 3 sec 04/10/2016  8:00 PM  Dressing Type Transparent 04/10/2016  8:00 PM  Dressing Status Clean;Dry;Intact 04/10/2016  8:00 PM  Dressing Change Due 04/12/16 04/06/2016  8:00 AM     Negative Pressure Wound Therapy Ankle Left;Medial;Lateral (Active)  Site / Wound Assessment Dressing in place / Unable to assess 04/10/2016  8:00 PM  Instillation Volume 125 mL 04/10/2016  6:00 PM  Output (mL) 50 mL 04/11/2016  6:00 AM     Negative Pressure Wound Therapy Ankle Right;Medial;Lateral (Active)  Site / Wound Assessment Dressing in place /  Unable to assess 04/10/2016  8:00 PM  Instillation Volume 100 mL 04/10/2016  6:00 PM  Output (mL) 25 mL 04/11/2016  6:00 AM     NG/OG Tube Orogastric 16 Fr. Center mouth Xray (Active)  Site Assessment Clean;Dry;Intact 04/10/2016  8:00 PM  Ongoing Placement Verification Auscultation 04/10/2016  8:00 PM  Status Infusing tube feed 04/10/2016  8:00 PM  Drainage Appearance Clear;Mucus;Tan 04/07/2016  8:00 AM  Intake (mL) 180 mL 04/10/2016 10:00 AM     Urethral Catheter Darla Anastasia Fiedler RN Non-latex;Temperature probe 16 Fr. (Active)  Indication for Insertion or Continuance of Catheter Peri-operative use for selective surgical procedure 04/10/2016  8:00 PM  Site Assessment Clean;Intact 04/10/2016  8:00 PM  Catheter Maintenance Bag below level of bladder;Catheter secured 04/10/2016  8:00 PM  Collection Container Standard drainage bag 04/10/2016  8:00 PM  Securement Method Securing device (Describe) 04/10/2016  8:00 PM  Urinary Catheter Interventions Unclamped 04/10/2016  8:00 AM  Output (mL) 150 mL 04/11/2016  7:00 AM    Microbiology/Sepsis markers: Results for orders placed or performed during the hospital encounter of 04/05/16  MRSA PCR Screening     Status: None   Collection Time: 04/05/16 10:19 PM  Result Value Ref Range Status   MRSA by PCR NEGATIVE NEGATIVE Final    Comment:        The GeneXpert MRSA Assay (FDA approved for NASAL specimens only), is one component of a comprehensive MRSA colonization surveillance program. It  is not intended to diagnose MRSA infection nor to guide or monitor treatment for MRSA infections.   Culture, respiratory (NON-Expectorated)     Status: None (Preliminary result)   Collection Time: 04/09/16 11:31 PM  Result Value Ref Range Status   Specimen Description TRACHEAL ASPIRATE  Final   Special Requests NONE  Final   Gram Stain   Final    MODERATE WBC PRESENT,BOTH PMN AND MONONUCLEAR NO ORGANISMS SEEN    Culture PENDING  Incomplete   Report Status PENDING   Incomplete    Anti-infectives:  Anti-infectives    Start     Dose/Rate Route Frequency Ordered Stop   04/09/16 2200  ceFAZolin (ANCEF) IVPB 1 g/50 mL premix     1 g 100 mL/hr over 30 Minutes Intravenous Every 8 hours 04/09/16 2038     04/09/16 1914  ceFAZolin (ANCEF) 2-4 GM/100ML-% IVPB    Comments:  Trixie Deis   : cabinet override      04/09/16 1914 04/10/16 0729   04/09/16 1720  vancomycin (VANCOCIN) powder  Status:  Discontinued       As needed 04/09/16 1721 04/09/16 2013   04/06/16 0600  ceFAZolin (ANCEF) IVPB 1 g/50 mL premix  Status:  Discontinued     1 g 100 mL/hr over 30 Minutes Intravenous Every 8 hours 04/06/16 0206 04/09/16 2038   04/05/16 1729  ceFAZolin (ANCEF) 2-4 GM/100ML-% IVPB    Comments:  Rumbarger, Rachel   : cabinet override      04/05/16 1729 04/05/16 1937      Best Practice/Protocols:   Continous Sedation  Consults: Treatment Team:  Newman Pies, MD Altamese [place], MD Eustace Moore, MD    Studies:CXR - Slight interval improvement in left basilar atelectasis. No pneumothorax nor large pleural effusion. The support tubes are in reasonable position. Subjective:    Overnight Issues:   Objective:  Vital signs for last 24 hours: Temp:  [100 F (37.8 C)-101.1 F (38.4 C)] 100.4 F (38 C) (10/05 0600) Pulse Rate:  [86-112] 102 (10/05 0725) Resp:  [0-24] 16 (10/05 0725) BP: (120-178)/(60-90) 178/82 (10/05 0725) SpO2:  [93 %-97 %] 97 % (10/05 0725) Arterial Line BP: (132-193)/(52-87) 193/87 (10/05 0600) FiO2 (%):  [40 %] 40 % (10/05 0725) Weight:  [108.5 kg (239 lb 3.2 oz)] 108.5 kg (239 lb 3.2 oz) (10/05 0425)  Hemodynamic parameters for last 24 hours:    Intake/Output from previous day: 10/04 0701 - 10/05 0700 In: 2984.3 [I.V.:1624; NG/GT:885.3; IV Piggyback:250] Out: [id number] [Urine:3240; Drains:75]  Intake/Output this shift: No intake/output data recorded.  Vent settings for last 24 hours: Vent Mode: PSV;CPAP FiO2 (%):  [40 %] 40  % Set Rate:  [24 bmp] 24 bmp Vt Set:  [600 mL] 600 mL PEEP:  [5 cmH20] 5 cmH20 Pressure Support:  [10 cmH20] 10 cmH20 Plateau Pressure:  [18 [id number] cmH20] 18 cmH20  Physical Exam:  General: on vent Neuro: sedated, pupils 70mm slug, not F/C for me HEENT/Neck: ETT Resp: clear to auscultation bilaterally CVS: RRR 100 GI: soft, NT, +BS Extremities: BLE ortho VACs, Ex fix L ankle  Results for orders placed or performed during the hospital encounter of 04/05/16 (from the past 24 hour(s))  Glucose, capillary     Status: Abnormal   Collection Time: 04/10/16  9:36 AM  Result Value Ref Range   Glucose-Capillary 131 (H) 65 - 99 mg/dL  Glucose, capillary     Status: Abnormal   Collection Time: 04/10/16 12:02 PM  Result Value Ref  Range   Glucose-Capillary 147 (H) 65 - 99 mg/dL   Comment 1 Notify RN    Comment 2 Document in Chart   Glucose, capillary     Status: Abnormal   Collection Time: 04/10/16  4:48 PM  Result Value Ref Range   Glucose-Capillary 182 (H) 65 - 99 mg/dL   Comment 1 Notify RN    Comment 2 Document in Chart   Glucose, capillary     Status: Abnormal   Collection Time: 04/10/16  8:13 PM  Result Value Ref Range   Glucose-Capillary 174 (H) 65 - 99 mg/dL  Glucose, capillary     Status: Abnormal   Collection Time: 04/10/16 11:25 PM  Result Value Ref Range   Glucose-Capillary 167 (H) 65 - 99 mg/dL  Glucose, capillary     Status: Abnormal   Collection Time: 04/11/16  3:49 AM  Result Value Ref Range   Glucose-Capillary 133 (H) 65 - 99 mg/dL  Basic metabolic panel     Status: Abnormal   Collection Time: 04/11/16  7:02 AM  Result Value Ref Range   Sodium 148 (H) 135 - 145 mmol/L   Potassium 3.6 3.5 - 5.1 mmol/L   Chloride 108 101 - 111 mmol/L   CO2 26 22 - 32 mmol/L   Glucose, Bld 136 (H) 65 - 99 mg/dL   BUN 19 6 - 20 mg/dL   Creatinine, Ser 0.59 (L) 0.61 - 1.24 mg/dL   Calcium 8.2 (L) 8.9 - 10.3 mg/dL   GFR calc non Af Amer >60 >60 mL/min   GFR calc Af Amer >60 >60  mL/min   Anion gap 14 5 - 15  Glucose, capillary     Status: Abnormal   Collection Time: 04/11/16  7:37 AM  Result Value Ref Range   Glucose-Capillary 139 (H) 65 - 99 mg/dL   Comment 1 Notify RN    Comment 2 Document in Chart     Assessment & Plan: Present on Admission: **None**    LOS: 6 days   Additional comments:I reviewed the patient's new clinical lab test results. and CXR [facility] TBI/SAH/IVH/SDH - Dr. Arnoldo Morale following C2, C3, C5 FXs with EDH - collar per Dr. Arnoldo Morale Rib FXs L 2-7, R 2,6,11; B pulm contusions  Vent dependent resp failure - weaning well on 10/5 now. Going back to OR this PM so will not extubate L talus and calcaneus FXs - to OR with Dr. [name] today ID - Ancef for open FXs R prox fibula FX - per Dr. Ninfa Linden HTN - increase lopressor, hydralazine PRN ABL anemia - CBC P now VTE - start Lovenox 10/6 FEN - TF held for OR Dispo - ICU Critical Care Total Time*: 41 Minutes  Georganna Skeans, MD, MPH, FACS Trauma: [phone] General Surgery: [phone]  04/11/2016  *Care during the described time interval was provided by me. I have reviewed this patient's available data, including medical history, events of note, physical examination and test results as part of my evaluation.  Patient ID: MUSA SORICH, male   DOB: Apr 20, 1961, 55 y.o.   MRN: [id number]

## 2016-04-11 NOTE — Anesthesia Postprocedure Evaluation (Signed)
Anesthesia Post Note  Patient: JAHDEN BLACKWOOD  Procedure(s) Performed: Procedure(s) (LRB): IRRIGATION AND DEBRIDEMENT BILATERAL LOWER EXTREMITY (Bilateral)  Patient location during evaluation: ICU Anesthesia Type: General Level of consciousness: awake and alert Pain management: pain level controlled Vital Signs Assessment: post-procedure vital signs reviewed and stable Respiratory status: spontaneous breathing, nonlabored ventilation, respiratory function stable, patient remains intubated per anesthesia plan and patient on ventilator - see flowsheet for VS Cardiovascular status: blood pressure returned to baseline and stable Postop Assessment: no signs of nausea or vomiting Anesthetic complications: no    Last Vitals:  Vitals:   04/11/16 1700 04/11/16 1730  BP: 132/81   Pulse: 95 91  Resp: 13 12  Temp: 37.8 C 37.8 C    Last Pain:  Vitals:   04/09/16 2000  TempSrc: Axillary                 Hjalmar Ballengee A

## 2016-04-11 NOTE — Anesthesia Postprocedure Evaluation (Signed)
Anesthesia Post Note  Patient: [name]  Procedure(s) Performed: Procedure(s) (LRB): IRRIGATION AND DEBRIDEMENT EXTREMITY (Bilateral) EXTERNAL FIXATION LEG (Left) OPEN REDUCTION INTERNAL FIXATION (ORIF) CALCANEOUS FRACTURE (Right) APPLICATION OF WOUND VAC (Left)  Patient location during evaluation: Other Anesthesia Type: General Level of consciousness: patient remains intubated per anesthesia plan and sedated Pain management: pain level controlled Vital Signs Assessment: post-procedure vital signs reviewed and stable Respiratory status: patient on ventilator - see flowsheet for VS and patient remains intubated per anesthesia plan Cardiovascular status: blood pressure returned to baseline and stable Anesthetic complications: no Comments: Pt returns to OR for further ortho surgery, remains intubated    Last Vitals:  Vitals:   04/11/16 1700 04/11/16 1730  BP: 132/81   Pulse: 95 91  Resp: 13 12  Temp: 37.8 C 37.8 C    Last Pain:  Vitals:   04/09/16 2000  TempSrc: Axillary                 Eisen Robenson,E. Taniyah Ballow

## 2016-04-11 NOTE — Transfer of Care (Signed)
Immediate Anesthesia Transfer of Care Note  Patient: [name]  Procedure(s) Performed: Procedure(s): IRRIGATION AND DEBRIDEMENT BILATERAL LOWER EXTREMITY (Bilateral)  Patient Location: SICU  Anesthesia Type:General  Level of Consciousness: sedated and Patient remains intubated per anesthesia plan  Airway & Oxygen Therapy: Patient remains intubated per anesthesia plan and Patient placed on Ventilator (see vital sign flow sheet for setting)  Post-op Assessment: Report given to RN and Post -op Vital signs reviewed and stable  Post vital signs: Reviewed and stable  Last Vitals:  Vitals:   04/11/16 1700 04/11/16 1730  BP: 132/81   Pulse: 95 91  Resp: 13 12  Temp: 37.8 C 37.8 C    Last Pain:  Vitals:   04/09/16 2000  TempSrc: Axillary         Complications: No apparent anesthesia complications

## 2016-04-11 NOTE — Progress Notes (Signed)
Pt to OR for I and D bilateral ankles. Report given to CRNA.

## 2016-04-12 ENCOUNTER — Inpatient Hospital Stay (HOSPITAL_COMMUNITY): Payer: No Typology Code available for payment source

## 2016-04-12 ENCOUNTER — Encounter (HOSPITAL_COMMUNITY): Payer: Self-pay | Admitting: Orthopedic Surgery

## 2016-04-12 LAB — BLOOD GAS, ARTERIAL
Acid-Base Excess: 4.3 mmol/L — ABNORMAL HIGH (ref 0.0–2.0)
Bicarbonate: 27.9 mmol/L (ref 20.0–28.0)
Drawn by: 227661
FIO2: 40
MECHVT: 600 mL
O2 Saturation: 96.6 %
PEEP: 5 cmH2O
Patient temperature: 100.5
RATE: 24 resp/min
pCO2 arterial: 40.9 mmHg (ref 32.0–48.0)
pH, Arterial: 7.454 — ABNORMAL HIGH (ref 7.350–7.450)
pO2, Arterial: 89.6 mmHg (ref 83.0–108.0)

## 2016-04-12 LAB — CBC
HCT: 31.5 % — ABNORMAL LOW (ref 39.0–52.0)
Hemoglobin: 9.8 g/dL — ABNORMAL LOW (ref 13.0–17.0)
MCH: 29.4 pg (ref 26.0–34.0)
MCHC: 31.1 g/dL (ref 30.0–36.0)
MCV: 94.6 fL (ref 78.0–100.0)
Platelets: 246 10*3/uL (ref 150–400)
RBC: 3.33 MIL/uL — ABNORMAL LOW (ref 4.22–5.81)
RDW: 15 % (ref 11.5–15.5)
WBC: 10.4 10*3/uL (ref 4.0–10.5)

## 2016-04-12 LAB — GLUCOSE, CAPILLARY
Glucose-Capillary: 133 mg/dL — ABNORMAL HIGH (ref 65–99)
Glucose-Capillary: 133 mg/dL — ABNORMAL HIGH (ref 65–99)
Glucose-Capillary: 136 mg/dL — ABNORMAL HIGH (ref 65–99)
Glucose-Capillary: 136 mg/dL — ABNORMAL HIGH (ref 65–99)
Glucose-Capillary: 136 mg/dL — ABNORMAL HIGH (ref 65–99)
Glucose-Capillary: 169 mg/dL — ABNORMAL HIGH (ref 65–99)
Glucose-Capillary: 196 mg/dL — ABNORMAL HIGH (ref 65–99)

## 2016-04-12 LAB — BASIC METABOLIC PANEL
Anion gap: 7 (ref 5–15)
BUN: 21 mg/dL — ABNORMAL HIGH (ref 6–20)
CO2: 26 mmol/L (ref 22–32)
Calcium: 8.1 mg/dL — ABNORMAL LOW (ref 8.9–10.3)
Chloride: 115 mmol/L — ABNORMAL HIGH (ref 101–111)
Creatinine, Ser: 0.53 mg/dL — ABNORMAL LOW (ref 0.61–1.24)
GFR calc Af Amer: >60 mL/min (ref >60)
GFR calc non Af Amer: >60 mL/min (ref >60)
Glucose, Bld: 132 mg/dL — ABNORMAL HIGH (ref 65–99)
Potassium: 3.6 mmol/L (ref 3.5–5.1)
Sodium: 148 mmol/L — ABNORMAL HIGH (ref 135–145)

## 2016-04-12 MED ORDER — MIDAZOLAM HCL 2 MG/2ML IJ SOLN
INTRAMUSCULAR | Status: AC
  Filled 2016-04-12: qty 2

## 2016-04-12 MED ORDER — FUROSEMIDE 10 MG/ML IJ SOLN
40.0000 mg | Freq: Once | INTRAMUSCULAR | Status: AC
  Administered 2016-04-12: 40 mg via INTRAVENOUS
  Filled 2016-04-12: qty 4

## 2016-04-12 MED ORDER — MIDAZOLAM HCL 2 MG/2ML IJ SOLN
2.0000 mg | INTRAMUSCULAR | Status: DC | PRN
  Administered 2016-04-12 – 2016-04-13 (×3): 2 mg via INTRAVENOUS
  Filled 2016-04-12 (×3): qty 2

## 2016-04-12 NOTE — Progress Notes (Signed)
Pt's vitals improved (HR 115, BP 136/69, sat 94, RR 24) after RT lavaged, suctioned and returned pt to [facility].

## 2016-04-12 NOTE — Progress Notes (Signed)
Follow up - Trauma and Critical Care  Patient Details:    [name] is an 55 y.o. male.  Lines/tubes : Airway 7.5 mm (Active)  Secured at (cm) 26 cm 04/12/2016  8:00 AM  Measured From Lips 04/12/2016  8:00 AM  Secured Location Right 04/12/2016  8:00 AM  Secured By [employer] 04/12/2016  8:00 AM  Tube Holder Repositioned Yes 04/12/2016  7:55 AM  Cuff Pressure (cm H2O) 24 cm H2O 04/12/2016 12:09 AM  Site Condition Dry 04/12/2016  8:00 AM     PICC Triple Lumen [id number] PICC Right Basilic 43 cm 0 cm (Active)  Indication for Insertion or Continuance of Line Head or chest injuries (Tracheotomy, burns, open chest wounds) 04/12/2016  8:00 AM  Exposed Catheter (cm) 0 cm 04/06/2016 11:54 AM  Site Assessment Clean;Dry;Intact 04/12/2016  8:00 AM  Lumen #1 Status Infusing;Saline locked 04/12/2016  8:00 AM  Lumen #2 Status Infusing 04/12/2016  8:00 AM  Lumen #3 Status Infusing 04/12/2016  8:00 AM  Dressing Type Transparent 04/12/2016  8:00 AM  Dressing Status Clean;Dry;Intact;Antimicrobial disc in place 04/12/2016  8:00 AM  Line Care Connections checked and tightened 04/12/2016  8:00 AM  Dressing Intervention Other (Comment) 04/12/2016  8:00 AM  Dressing Change Due 04/13/16 04/09/2016  8:00 AM     Arterial Line 04/05/16 Right Radial (Active)  Site Assessment Clean;Intact;Dry 04/12/2016  8:00 AM  Line Status Pulsatile blood flow 04/12/2016  8:00 AM  Art Line Waveform Whip 04/12/2016  8:00 AM  Art Line Interventions Connections checked and tightened 04/12/2016  8:00 AM  Color/Movement/Sensation Capillary refill less than 3 sec 04/12/2016  8:00 AM  Dressing Type Transparent 04/12/2016  8:00 AM  Dressing Status Clean;Dry;Intact 04/12/2016  8:00 AM  Interventions Other (Comment) 04/12/2016  8:00 AM  Dressing Change Due 04/12/16 04/06/2016  8:00 AM     Negative Pressure Wound Therapy Ankle Left;Medial;Lateral (Active)  Site / Wound Assessment Dressing in place / Unable to assess 04/12/2016  8:00 AM   Target Pressure (mmHg) 100 04/12/2016  8:00 AM  Instillation Volume 125 mL 04/10/2016  6:00 PM  Output (mL) 0 mL 04/11/2016  6:00 PM     Negative Pressure Wound Therapy Ankle Right;Medial;Lateral (Active)  Site / Wound Assessment Dressing in place / Unable to assess 04/12/2016  8:00 AM  Target Pressure (mmHg) 100 04/12/2016  8:00 AM  Instillation Volume 100 mL 04/10/2016  6:00 PM  Output (mL) 0 mL 04/11/2016  6:00 PM     NG/OG Tube Orogastric 16 Fr. Center mouth Xray (Active)  Site Assessment Clean;Dry;Intact 04/12/2016  8:00 AM  Ongoing Placement Verification Auscultation 04/12/2016  8:00 AM  Status Infusing tube feed 04/12/2016  8:00 AM  Drainage Appearance Clear;Mucus;Tan 04/07/2016  8:00 AM  Intake (mL) 180 mL 04/10/2016 10:00 AM     Urethral Catheter Darla Anastasia Fiedler RN Non-latex;Temperature probe 16 Fr. (Active)  Indication for Insertion or Continuance of Catheter Peri-operative use for selective surgical procedure 04/12/2016  8:00 AM  Site Assessment Clean;Intact 04/12/2016  8:00 AM  Catheter Maintenance Bag below level of bladder;Catheter secured;Drainage bag/tubing not touching floor;Insertion date on drainage bag;No dependent loops;Seal intact 04/12/2016  8:00 AM  Collection Container Standard drainage bag 04/12/2016  8:00 AM  Securement Method Securing device (Describe) 04/12/2016  8:00 AM  Urinary Catheter Interventions Unclamped 04/12/2016  8:00 AM  Output (mL) 250 mL 04/12/2016  7:42 AM    Microbiology/Sepsis markers: Results for orders placed or performed during the hospital encounter of 04/05/16  MRSA PCR Screening     Status: None   Collection Time: 04/05/16 10:19 PM  Result Value Ref Range Status   MRSA by PCR NEGATIVE NEGATIVE Final    Comment:        The GeneXpert MRSA Assay (FDA approved for NASAL specimens only), is one component of a comprehensive MRSA colonization surveillance program. It is not intended to diagnose MRSA infection nor to guide or monitor treatment  for MRSA infections.   Culture, respiratory (NON-Expectorated)     Status: None (Preliminary result)   Collection Time: 04/09/16 11:31 PM  Result Value Ref Range Status   Specimen Description TRACHEAL ASPIRATE  Final   Special Requests NONE  Final   Gram Stain   Final    MODERATE WBC PRESENT,BOTH PMN AND MONONUCLEAR NO ORGANISMS SEEN    Culture CULTURE REINCUBATED FOR BETTER GROWTH  Final   Report Status PENDING  Incomplete    Anti-infectives:  Anti-infectives    Start     Dose/Rate Route Frequency Ordered Stop   04/09/16 2200  ceFAZolin (ANCEF) IVPB 1 g/50 mL premix     1 g 100 mL/hr over 30 Minutes Intravenous Every 8 hours 04/09/16 2038 04/13/16 1800   04/09/16 1914  ceFAZolin (ANCEF) 2-4 GM/100ML-% IVPB    Comments:  Trixie Deis   : cabinet override      04/09/16 1914 04/10/16 0729   04/09/16 1720  vancomycin (VANCOCIN) powder  Status:  Discontinued       As needed 04/09/16 1721 04/09/16 2013   04/06/16 0600  ceFAZolin (ANCEF) IVPB 1 g/50 mL premix  Status:  Discontinued     1 g 100 mL/hr over 30 Minutes Intravenous Every 8 hours 04/06/16 0206 04/09/16 2038   04/05/16 1729  ceFAZolin (ANCEF) 2-4 GM/100ML-% IVPB    Comments:  Rumbarger, Rachel   : cabinet override      04/05/16 1729 04/05/16 1937      Best Practice/Protocols:  GI Prophylaxis: Proton Pump Inhibitor No DVT prophylaxis Continous Sedation  Consults: Treatment Team:  Newman Pies, MD Altamese Lyden, MD Eustace Moore, MD    Events:  Subjective:    Overnight Issues: Not awakening as well today, oxygenation not quite as good.  Not moving LUE  Objective:  Vital signs for last 24 hours: Temp:  [97.9 F (36.6 C)-100.4 F (38 C)] 100.4 F (38 C) (10/06 1000) Pulse Rate:  [72-127] 114 (10/06 1000) Resp:  [10-24] 19 (10/06 1000) BP: (107-194)/(62-115) 147/77 (10/06 1000) SpO2:  [93 %-100 %] 94 % (10/06 1000) Arterial Line BP: (119-206)/(54-91) 160/64 (10/06 1000) FiO2 (%):  [40 %] 40 % (10/06  0943) Weight:  [103.1 kg (227 lb 4.7 oz)] 103.1 kg (227 lb 4.7 oz) (10/06 0358)  Hemodynamic parameters for last 24 hours:    Intake/Output from previous day: 10/05 0701 - 10/06 0700 In: 2142.9 [I.V.:2042.9; IV Piggyback:100] Out: 1960 [Urine:1950; Blood:10]  Intake/Output this shift: Total I/O In: 326.9 [I.V.:200.2; NG/GT:126.8] Out: 250 [Urine:250]  Vent settings for last 24 hours: Vent Mode: PRVC FiO2 (%):  [40 %] 40 % Set Rate:  [24 bmp] 24 bmp Vt Set:  [600 mL] 600 mL PEEP:  [5 cmH20] 5 cmH20 Pressure Support:  [5 cmH20-10 cmH20] 5 cmH20 Plateau Pressure:  [18 [id number] cmH20] 18 cmH20  Physical Exam:  General: no respiratory distress and More somnolent than yesterday. Neuro: RASS 0, RASS -1, weakness left upper extremity and Have not seen him move any extremity besides his RUE Resp: clear to  auscultation bilaterally and CXR is clear with the exception of small left effusion CVS: regular rate and rhythm, S1, S2 normal, no murmur, click, rub or gallop GI: Firm with good bowel sounds.  tolerating tube feedings well.\ Extremities: edema 2+  Results for orders placed or performed during the hospital encounter of 04/05/16 (from the past 24 hour(s))  Glucose, capillary     Status: Abnormal   Collection Time: 04/11/16 11:55 AM  Result Value Ref Range   Glucose-Capillary 136 (H) 65 - 99 mg/dL   Comment 1 Notify RN    Comment 2 Document in Chart   Glucose, capillary     Status: Abnormal   Collection Time: 04/11/16  4:42 PM  Result Value Ref Range   Glucose-Capillary 126 (H) 65 - 99 mg/dL  Glucose, capillary     Status: Abnormal   Collection Time: 04/11/16  9:39 PM  Result Value Ref Range   Glucose-Capillary 121 (H) 65 - 99 mg/dL  Glucose, capillary     Status: Abnormal   Collection Time: 04/12/16 12:45 AM  Result Value Ref Range   Glucose-Capillary 133 (H) 65 - 99 mg/dL   Comment 1 Notify RN   Glucose, capillary     Status: Abnormal   Collection Time: 04/12/16  4:53 AM   Result Value Ref Range   Glucose-Capillary 136 (H) 65 - 99 mg/dL   Comment 1 Notify RN   Glucose, capillary     Status: Abnormal   Collection Time: 04/12/16  7:23 AM  Result Value Ref Range   Glucose-Capillary 136 (H) 65 - 99 mg/dL  Basic metabolic panel     Status: Abnormal   Collection Time: 04/12/16  8:00 AM  Result Value Ref Range   Sodium 148 (H) 135 - 145 mmol/L   Potassium 3.6 3.5 - 5.1 mmol/L   Chloride 115 (H) 101 - 111 mmol/L   CO2 26 22 - 32 mmol/L   Glucose, Bld 132 (H) 65 - 99 mg/dL   BUN 21 (H) 6 - 20 mg/dL   Creatinine, Ser 0.53 (L) 0.61 - 1.24 mg/dL   Calcium 8.1 (L) 8.9 - 10.3 mg/dL   GFR calc non Af Amer >60 >60 mL/min   GFR calc Af Amer >60 >60 mL/min   Anion gap 7 5 - 15     Assessment/Plan:   NEURO  Altered Mental Status:  obtundation, sedation and Not as awake and responsive as yesterday.   Plan: Patient is more sleepy today.  PULM  No obvious problems today.   Plan: CPM and maybe try to wean if ABG ok  CARDIO  No specific issues   Plan: CPM  RENAL  Hypervolemia   Plan: Lasix again.  GI  No specific issues   Plan: Tolerating tube feeding and will continue  ID  No known infectious source   Plan: CPM  HEME  Anemia acute blood loss anemia)   Plan: No blood needed currently  ENDO No problems   Plan: CPM  Global Issues  Patient not as easily weanable as yesterday.  Not as arousable either.  Weakness in extremities, especially the left upper extremity.  Will get MRI of head, C-spine and cheat (to get brachial plexus)    LOS: 7 days   Additional comments:I reviewed the patient's new clinical lab test results. cbc/bmet and I reviewed the patients new imaging test results. cxr  Critical Care Total Time*: 45 Minutes  Neil Brickell 04/12/2016  *Care during the described time interval was provided  by me and/or other providers on the critical care team.  I have reviewed this patient's available data, including medical history, events of note,  physical examination and test results as part of my evaluation.

## 2016-04-12 NOTE — Progress Notes (Signed)
Pt remains tachycardic and hypertensive ( 125, 194/83) despite increase in fentanyl. Sat 93%, RR16 on PSV. RT to evaluate.

## 2016-04-12 NOTE — Progress Notes (Signed)
Pt HR 120s, BP 170/110 after hydralazine. Awake, bending rt arm. Not following commands. Will resume fentanyl at previous dose of 278mcg/hr (was halved to 185mcg/hr for [facility]).

## 2016-04-12 NOTE — Progress Notes (Signed)
[facility] Progress Note  Subjective  Intubated but opens eyes to stimuli  Review of Systems  Unable to perform ROS: Intubated     Objective   BP (!) 176/92   Pulse (!) 116   Temp 99.7 F (37.6 C)   Resp 16   Ht 5\' 11"  (1.803 m)   Wt 103.1 kg (227 lb 4.7 oz)   SpO2 94%   BMI 31.70 kg/m   Intake/Output      10/05 0701 - 10/06 0700 10/06 0701 - 10/07 0700   I.V. (mL/kg) 2042.9 (19.8) 67 (0.6)   Other     NG/GT     IV Piggyback 100    Total Intake(mL/kg) 2142.9 (20.8) 67 (0.6)   Urine (mL/kg/hr) 1950 (0.8) 250 (1.1)   Drains 0 (0)    Blood 10 (0)    Total Output 1960 250   Net +182.9 -183          Labs  CBG (last 3)   Recent Labs  04/12/16 0045 04/12/16 0453 04/12/16 0723  GLUCAP 133* 136* 136*      Exam  Gen: intubated and sedated, opens eyes to stimuli  Ext:       Bilateral lower Extremities   Ex fix stable on left leg  Splint stable on Right leg  Exts are warm   Brisk cap refill B   vacs functioning and with good seal    Assessment and Plan   POD/HD#: 1  55 y/o white male s/p [facility]   - [facility]   -bilateral talo-calcaneal fracture dislocations and L talonavicular dislocation,              s/p I&D, open reduction, pinning R ankle/subtalar joint, Ex fix Left ankle and pinning subtalar joint and tibiotalarjoint- 04/09/2016  S/p reapeat I&D and closure wounds B feet- 04/11/2016                          VAC removal Monday/tuesday  Ex fix and pinning to be definitive procedure for this hospitalization as long as there are no wound/soft tissue complications  Anticipate pt remaining in ex fix x 8 weeks or so  He will eventually need hind foot fusion nail    Once able will have extensive discussion with pt and wife again regarding anticipated functionality of his legs. Suspect pt would have more function with B BKA but at this time there are no physiologic issues pressing the need for amputation     - C spine fracture   Per NS  - ? Flail Left upper extremity   Pt has been moving Right upper during assessments but no movement noted in Left upper extremity   Given constellation of clinical findings and radiographic findings, and mechanism there is concern for nerve root avulsion, brachial plexus injury    MRI to be ordered    If injury present pt will need eval by Dr. Truman Hayward at [facility]    - Pain management:             Per TS   - ABL anemia/Hemodynamics            stable   CBC in am      - DVT/PE prophylaxis:             Lovenox once CBC stabilized      - Dispo:             continue per TS and  NS  Ortho issues stabilized     Jari Pigg, PA-C [facility] [phone] (O) 04/12/2016 9:12 AM

## 2016-04-13 ENCOUNTER — Inpatient Hospital Stay (HOSPITAL_COMMUNITY): Payer: No Typology Code available for payment source

## 2016-04-13 LAB — CBC WITH DIFFERENTIAL/PLATELET
Basophils Absolute: 0 10*3/uL (ref 0.0–0.1)
Basophils Relative: 0 %
Eosinophils Absolute: 0.2 10*3/uL (ref 0.0–0.7)
Eosinophils Relative: 1 %
HCT: 30.1 % — ABNORMAL LOW (ref 39.0–52.0)
Hemoglobin: 9.2 g/dL — ABNORMAL LOW (ref 13.0–17.0)
Lymphocytes Relative: 15 %
Lymphs Abs: 1.6 10*3/uL (ref 0.7–4.0)
MCH: 29.2 pg (ref 26.0–34.0)
MCHC: 30.6 g/dL (ref 30.0–36.0)
MCV: 95.6 fL (ref 78.0–100.0)
Monocytes Absolute: 1.1 10*3/uL — ABNORMAL HIGH (ref 0.1–1.0)
Monocytes Relative: 11 %
Neutro Abs: 7.7 10*3/uL (ref 1.7–7.7)
Neutrophils Relative %: 73 %
Platelets: 245 10*3/uL (ref 150–400)
RBC: 3.15 MIL/uL — ABNORMAL LOW (ref 4.22–5.81)
RDW: 15.1 % (ref 11.5–15.5)
WBC: 10.5 10*3/uL (ref 4.0–10.5)

## 2016-04-13 LAB — GLUCOSE, CAPILLARY
Glucose-Capillary: 175 mg/dL — ABNORMAL HIGH (ref 65–99)
Glucose-Capillary: 193 mg/dL — ABNORMAL HIGH (ref 65–99)
Glucose-Capillary: 161 mg/dL — ABNORMAL HIGH (ref 65–99)
Glucose-Capillary: 167 mg/dL — ABNORMAL HIGH (ref 65–99)
Glucose-Capillary: 173 mg/dL — ABNORMAL HIGH (ref 65–99)

## 2016-04-13 LAB — CULTURE, RESPIRATORY W GRAM STAIN

## 2016-04-13 LAB — BASIC METABOLIC PANEL
Anion gap: 4 — ABNORMAL LOW (ref 5–15)
BUN: 25 mg/dL — ABNORMAL HIGH (ref 6–20)
CO2: 30 mmol/L (ref 22–32)
Calcium: 8.7 mg/dL — ABNORMAL LOW (ref 8.9–10.3)
Chloride: 116 mmol/L — ABNORMAL HIGH (ref 101–111)
Creatinine, Ser: 0.66 mg/dL (ref 0.61–1.24)
GFR calc Af Amer: >60 mL/min (ref >60)
GFR calc non Af Amer: >60 mL/min (ref >60)
Glucose, Bld: 141 mg/dL — ABNORMAL HIGH (ref 65–99)
Potassium: 3.5 mmol/L (ref 3.5–5.1)
Sodium: 150 mmol/L — ABNORMAL HIGH (ref 135–145)

## 2016-04-13 LAB — CULTURE, RESPIRATORY: Culture: NORMAL

## 2016-04-13 MED ORDER — GADOBENATE DIMEGLUMINE 529 MG/ML IV SOLN
20.0000 mL | Freq: Once | INTRAVENOUS | Status: AC | PRN
  Administered 2016-04-13: 20 mL via INTRAVENOUS

## 2016-04-13 MED ORDER — FREE WATER
200.0000 mL | Freq: Three times a day (TID) | Status: DC
  Administered 2016-04-13 – 2016-04-17 (×10): 200 mL

## 2016-04-13 NOTE — Progress Notes (Signed)
Patient transported from 3M02 to MRI and back with no complications.

## 2016-04-13 NOTE — Progress Notes (Addendum)
Follow up - Trauma and Critical Care  Patient Details:    [name] is an 55 y.o. male.  Lines/tubes : Airway 7.5 mm (Active)  Secured at (cm) 25 cm 04/13/2016  7:21 AM  Measured From Lips 04/13/2016  7:21 AM  Secured Location Left 04/13/2016  7:21 AM  Secured By [employer] 04/13/2016  7:21 AM  Tube Holder Repositioned Yes 04/13/2016  7:21 AM  Cuff Pressure (cm H2O) 26 cm H2O 04/12/2016 11:34 PM  Site Condition Dry 04/13/2016  7:21 AM     PICC Triple Lumen [id number] PICC Right Basilic 43 cm 0 cm (Active)  Indication for Insertion or Continuance of Line Head or chest injuries (Tracheotomy, burns, open chest wounds) 04/12/2016  8:00 PM  Exposed Catheter (cm) 0 cm 04/06/2016 11:54 AM  Site Assessment Clean;Dry;Intact 04/12/2016  8:00 PM  Lumen #1 Status Blood return noted;Flushed;Saline locked 04/12/2016  8:00 PM  Lumen #2 Status Infusing 04/12/2016  8:00 PM  Lumen #3 Status Infusing 04/12/2016  8:00 PM  Dressing Type Transparent 04/12/2016  8:00 PM  Dressing Status Clean;Dry;Intact;Antimicrobial disc in place 04/12/2016  8:00 PM  Line Care Tubing changed 04/13/2016  6:00 AM  Dressing Intervention Other (Comment) 04/12/2016  8:00 AM  Dressing Change Due 04/13/16 04/12/2016  8:00 PM     Negative Pressure Wound Therapy Ankle Left;Medial;Lateral (Active)  Site / Wound Assessment Dressing in place / Unable to assess 04/12/2016  8:00 PM  Target Pressure (mmHg) 100 04/12/2016  8:00 AM  Instillation Volume 125 mL 04/10/2016  6:00 PM  Output (mL) 25 mL 04/13/2016  7:00 AM     Negative Pressure Wound Therapy Ankle Right;Medial;Lateral (Active)  Site / Wound Assessment Dressing in place / Unable to assess 04/12/2016  8:00 AM  Target Pressure (mmHg) 100 04/12/2016  8:00 AM  Instillation Volume 100 mL 04/10/2016  6:00 PM  Output (mL) 40 mL 04/13/2016  7:00 AM     NG/OG Tube Orogastric 16 Fr. Center mouth Xray (Active)  Site Assessment Clean;Dry;Intact 04/12/2016  8:00 PM  Ongoing Placement  Verification Auscultation 04/12/2016  8:00 PM  Status Infusing tube feed 04/12/2016  8:00 PM  Drainage Appearance Clear;Mucus;Tan 04/07/2016  8:00 AM  Intake (mL) 100 mL 04/12/2016 10:45 PM     Urethral Catheter Darla Anastasia Fiedler RN Non-latex;Temperature probe 16 Fr. (Active)  Indication for Insertion or Continuance of Catheter Peri-operative use for selective surgical procedure 04/12/2016  8:00 PM  Site Assessment Clean;Intact 04/12/2016  8:00 PM  Catheter Maintenance Bag emptied prior to transport 04/13/2016  1:00 AM  Collection Container Standard drainage bag 04/12/2016  8:00 PM  Securement Method Securing device (Describe) 04/12/2016  8:00 PM  Urinary Catheter Interventions Unclamped 04/12/2016  8:00 PM  Output (mL) 125 mL 04/13/2016  7:00 AM    Microbiology/Sepsis markers: Results for orders placed or performed during the hospital encounter of 04/05/16  MRSA PCR Screening     Status: None   Collection Time: 04/05/16 10:19 PM  Result Value Ref Range Status   MRSA by PCR NEGATIVE NEGATIVE Final    Comment:        The GeneXpert MRSA Assay (FDA approved for NASAL specimens only), is one component of a comprehensive MRSA colonization surveillance program. It is not intended to diagnose MRSA infection nor to guide or monitor treatment for MRSA infections.   Culture, respiratory (NON-Expectorated)     Status: None (Preliminary result)   Collection Time: 04/09/16 11:31 PM  Result Value Ref Range Status  Specimen Description TRACHEAL ASPIRATE  Final   Special Requests NONE  Final   Gram Stain   Final    MODERATE WBC PRESENT,BOTH PMN AND MONONUCLEAR NO ORGANISMS SEEN    Culture CULTURE REINCUBATED FOR BETTER GROWTH  Final   Report Status PENDING  Incomplete    Anti-infectives:  Anti-infectives    Start     Dose/Rate Route Frequency Ordered Stop   04/09/16 2200  ceFAZolin (ANCEF) IVPB 1 g/50 mL premix     1 g 100 mL/hr over 30 Minutes Intravenous Every 8 hours 04/09/16 2038 04/13/16  1800   04/09/16 1914  ceFAZolin (ANCEF) 2-4 GM/100ML-% IVPB    Comments:  Abbie Sons, Sarah   : cabinet override      04/09/16 1914 04/10/16 0729   04/09/16 1720  vancomycin (VANCOCIN) powder  Status:  Discontinued       As needed 04/09/16 1721 04/09/16 2013   04/06/16 0600  ceFAZolin (ANCEF) IVPB 1 g/50 mL premix  Status:  Discontinued     1 g 100 mL/hr over 30 Minutes Intravenous Every 8 hours 04/06/16 0206 04/09/16 2038   04/05/16 1729  ceFAZolin (ANCEF) 2-4 GM/100ML-% IVPB    Comments:  Rumbarger, Rachel   : cabinet override      04/05/16 1729 04/05/16 1937      Best Practice/Protocols:  VTE Prophylaxis: Mechanical Intermittent Sedation  Consults: Treatment Team:  Newman Pies, MD Altamese Bostic, MD    Events:  Subjective:    Overnight Issues: No changes  Objective:  Vital signs for last 24 hours: Temp:  [99.7 F (37.6 C)-101.3 F (38.5 C)] 100 F (37.8 C) (10/07 0721) Pulse Rate:  [80-127] 93 (10/07 0721) Resp:  [15-24] 24 (10/07 0721) BP: (93-194)/(47-115) 145/79 (10/07 0721) SpO2:  [93 %-99 %] 98 % (10/07 0721) Arterial Line BP: (135-206)/(60-91) 135/60 (10/06 1500) FiO2 (%):  [40 %] 40 % (10/07 0721) Weight:  [101.4 kg (223 lb 8.7 oz)] 101.4 kg (223 lb 8.7 oz) (10/07 0500)  Hemodynamic parameters for last 24 hours:    Intake/Output from previous day: 10/06 0701 - 10/07 0700 In: 2917.2 [I.V.:1474.4; NG/GT:1292.8; IV Piggyback:150] Out: 2390 [Urine:2325; Drains:65]  Intake/Output this shift: No intake/output data recorded.  Vent settings for last 24 hours: Vent Mode: PRVC FiO2 (%):  [40 %] 40 % Set Rate:  [24 bmp] 24 bmp Vt Set:  [600 mL] 600 mL PEEP:  [5 cmH20] 5 cmH20 Plateau Pressure:  [18 cmH20-22 cmH20] 18 cmH20  Physical Exam:  Gen: intubated sedated HEENT: tube in place, no ecchymosis Resp: coarse b/l Cardiovascular: RRR Abdomen: soft, NT, ND Ext: right ankle with ex fix, left leg with ace wrap Neuro: GCS 8T (spontaneous right arm  movement)  Results for orders placed or performed during the hospital encounter of 04/05/16 (from the past 24 hour(s))  Blood gas, arterial     Status: Abnormal   Collection Time: 04/12/16 10:16 AM  Result Value Ref Range   FIO2 40.00    Delivery systems VENTILATOR    VT 600 mL   LHR 24 resp/min   Peep/cpap 5.0 cm H20   pH, Arterial 7.454 (H) 7.350 - 7.450   pCO2 arterial 40.9 32.0 - 48.0 mmHg   pO2, Arterial 89.6 83.0 - 108.0 mmHg   Bicarbonate 27.9 20.0 - 28.0 mmol/L   Acid-Base Excess 4.3 (H) 0.0 - 2.0 mmol/L   O2 Saturation 96.6 %   Patient temperature 100.5    Collection site A-LINE    Drawn by [id number]  Sample type ARTERIAL DRAW   CBC     Status: Abnormal   Collection Time: 04/12/16 11:05 AM  Result Value Ref Range   WBC 10.4 4.0 - 10.5 K/uL   RBC 3.33 (L) 4.22 - 5.81 MIL/uL   Hemoglobin 9.8 (L) 13.0 - 17.0 g/dL   HCT 31.5 (L) 39.0 - 52.0 %   MCV 94.6 78.0 - 100.0 fL   MCH 29.4 26.0 - 34.0 pg   MCHC 31.1 30.0 - 36.0 g/dL   RDW 15.0 11.5 - 15.5 %   Platelets 246 150 - 400 K/uL  Glucose, capillary     Status: Abnormal   Collection Time: 04/12/16 12:02 PM  Result Value Ref Range   Glucose-Capillary 169 (H) 65 - 99 mg/dL  Glucose, capillary     Status: Abnormal   Collection Time: 04/12/16  3:27 PM  Result Value Ref Range   Glucose-Capillary 196 (H) 65 - 99 mg/dL  Glucose, capillary     Status: Abnormal   Collection Time: 04/12/16  7:38 PM  Result Value Ref Range   Glucose-Capillary 133 (H) 65 - 99 mg/dL  Glucose, capillary     Status: Abnormal   Collection Time: 04/12/16 11:42 PM  Result Value Ref Range   Glucose-Capillary 136 (H) 65 - 99 mg/dL  CBC with Differential/Platelet     Status: Abnormal   Collection Time: 04/13/16  5:20 AM  Result Value Ref Range   WBC 10.5 4.0 - 10.5 K/uL   RBC 3.15 (L) 4.22 - 5.81 MIL/uL   Hemoglobin 9.2 (L) 13.0 - 17.0 g/dL   HCT 30.1 (L) 39.0 - 52.0 %   MCV 95.6 78.0 - 100.0 fL   MCH 29.2 26.0 - 34.0 pg   MCHC 30.6 30.0 - 36.0  g/dL   RDW 15.1 11.5 - 15.5 %   Platelets 245 150 - 400 K/uL   Neutrophils Relative % 73 %   Neutro Abs 7.7 1.7 - 7.7 K/uL   Lymphocytes Relative 15 %   Lymphs Abs 1.6 0.7 - 4.0 K/uL   Monocytes Relative 11 %   Monocytes Absolute 1.1 (H) 0.1 - 1.0 K/uL   Eosinophils Relative 1 %   Eosinophils Absolute 0.2 0.0 - 0.7 K/uL   Basophils Relative 0 %   Basophils Absolute 0.0 0.0 - 0.1 K/uL  Basic metabolic panel     Status: Abnormal   Collection Time: 04/13/16  5:20 AM  Result Value Ref Range   Sodium 150 (H) 135 - 145 mmol/L   Potassium 3.5 3.5 - 5.1 mmol/L   Chloride 116 (H) 101 - 111 mmol/L   CO2 30 22 - 32 mmol/L   Glucose, Bld 141 (H) 65 - 99 mg/dL   BUN 25 (H) 6 - 20 mg/dL   Creatinine, Ser 0.66 0.61 - 1.24 mg/dL   Calcium 8.7 (L) 8.9 - 10.3 mg/dL   GFR calc non Af Amer >60 >60 mL/min   GFR calc Af Amer >60 >60 mL/min   Anion gap 4 (L) 5 - 15     Assessment/Plan:   NEURO  [facility]   Plan: continue supportive measures, f/u NSG recs  PULM  Weaning vent for acute resp failure   Plan: CPM  CARDIO  No issues   Plan: on home similar meds to home medication  RENAL  Worsening hypernatremia   Plan: free water per tube  GI  No issues   Plan: continue G tube feeds  ID  No issues   Plan: continue  excellent nursing preventive care  HEME  [facility]   Plan: mech VTE proph  ENDO No issues   Plan: serial labs for acute system injury  Global Issues   ICU for weaning vent   LOS: 8 days   Additional comments:None  Critical Care Total Time*: 15 Minutes  Arta Bruce Kinsinger 04/13/2016  *Care during the described time interval was provided by me and/or other providers on the critical care team.  I have reviewed this patient's available data, including medical history, events of note, physical examination and test results as part of my evaluation.

## 2016-04-14 ENCOUNTER — Encounter (HOSPITAL_COMMUNITY): Payer: Self-pay | Admitting: General Practice

## 2016-04-14 ENCOUNTER — Inpatient Hospital Stay (HOSPITAL_COMMUNITY): Payer: No Typology Code available for payment source

## 2016-04-14 LAB — CBC
HCT: 31.6 % — ABNORMAL LOW (ref 39.0–52.0)
Hemoglobin: 9.3 g/dL — ABNORMAL LOW (ref 13.0–17.0)
MCH: 28.5 pg (ref 26.0–34.0)
MCHC: 29.4 g/dL — ABNORMAL LOW (ref 30.0–36.0)
MCV: 96.9 fL (ref 78.0–100.0)
Platelets: 269 10*3/uL (ref 150–400)
RBC: 3.26 MIL/uL — ABNORMAL LOW (ref 4.22–5.81)
RDW: 15 % (ref 11.5–15.5)
WBC: 12.4 10*3/uL — ABNORMAL HIGH (ref 4.0–10.5)

## 2016-04-14 LAB — BASIC METABOLIC PANEL
Anion gap: 5 (ref 5–15)
BUN: 25 mg/dL — ABNORMAL HIGH (ref 6–20)
CO2: 29 mmol/L (ref 22–32)
Calcium: 8.6 mg/dL — ABNORMAL LOW (ref 8.9–10.3)
Chloride: 116 mmol/L — ABNORMAL HIGH (ref 101–111)
Creatinine, Ser: 0.57 mg/dL — ABNORMAL LOW (ref 0.61–1.24)
GFR calc Af Amer: >60 mL/min (ref >60)
GFR calc non Af Amer: >60 mL/min (ref >60)
Glucose, Bld: 183 mg/dL — ABNORMAL HIGH (ref 65–99)
Potassium: 3.6 mmol/L (ref 3.5–5.1)
Sodium: 150 mmol/L — ABNORMAL HIGH (ref 135–145)

## 2016-04-14 LAB — GLUCOSE, CAPILLARY
Glucose-Capillary: 202 mg/dL — ABNORMAL HIGH (ref 65–99)
Glucose-Capillary: 196 mg/dL — ABNORMAL HIGH (ref 65–99)
Glucose-Capillary: 154 mg/dL — ABNORMAL HIGH (ref 65–99)
Glucose-Capillary: 164 mg/dL — ABNORMAL HIGH (ref 65–99)
Glucose-Capillary: 171 mg/dL — ABNORMAL HIGH (ref 65–99)
Glucose-Capillary: 209 mg/dL — ABNORMAL HIGH (ref 65–99)

## 2016-04-14 MED ORDER — BISACODYL 10 MG RE SUPP
10.0000 mg | Freq: Every day | RECTAL | Status: DC | PRN
  Filled 2016-04-14: qty 1

## 2016-04-14 MED ORDER — METOPROLOL TARTRATE 25 MG/10 ML ORAL SUSPENSION
50.0000 mg | Freq: Two times a day (BID) | ORAL | Status: DC
  Administered 2016-04-14 – 2016-04-30 (×22): 50 mg
  Filled 2016-04-14 (×27): qty 20

## 2016-04-14 MED ORDER — ENOXAPARIN SODIUM 40 MG/0.4ML ~~LOC~~ SOLN
40.0000 mg | SUBCUTANEOUS | Status: DC
Start: 2016-04-14 — End: 2016-04-15
  Administered 2016-04-14: 40 mg via SUBCUTANEOUS
  Filled 2016-04-14: qty 0.4

## 2016-04-14 MED ORDER — INSULIN ASPART 100 UNIT/ML ~~LOC~~ SOLN
0.0000 [IU] | SUBCUTANEOUS | Status: DC
  Administered 2016-04-14: 3 [IU] via SUBCUTANEOUS
  Administered 2016-04-14 (×2): 2 [IU] via SUBCUTANEOUS
  Administered 2016-04-15 – 2016-04-16 (×7): 1 [IU] via SUBCUTANEOUS
  Administered 2016-04-16: 3 [IU] via SUBCUTANEOUS
  Administered 2016-04-16: 2 [IU] via SUBCUTANEOUS
  Administered 2016-04-16: 1 [IU] via SUBCUTANEOUS
  Administered 2016-04-17: 2 [IU] via SUBCUTANEOUS
  Administered 2016-04-17: 1 [IU] via SUBCUTANEOUS
  Administered 2016-04-17 (×2): 2 [IU] via SUBCUTANEOUS
  Administered 2016-04-17: 1 [IU] via SUBCUTANEOUS
  Administered 2016-04-17: 2 [IU] via SUBCUTANEOUS
  Administered 2016-04-17: 1 [IU] via SUBCUTANEOUS
  Administered 2016-04-18 (×2): 2 [IU] via SUBCUTANEOUS
  Administered 2016-04-18 (×3): 1 [IU] via SUBCUTANEOUS
  Administered 2016-04-19 (×2): 2 [IU] via SUBCUTANEOUS
  Administered 2016-04-19: 1 [IU] via SUBCUTANEOUS
  Administered 2016-04-19 (×2): 2 [IU] via SUBCUTANEOUS
  Administered 2016-04-20 (×4): 1 [IU] via SUBCUTANEOUS
  Administered 2016-04-20: 2 [IU] via SUBCUTANEOUS
  Administered 2016-04-20: 1 [IU] via SUBCUTANEOUS
  Administered 2016-04-21: 2 [IU] via SUBCUTANEOUS
  Administered 2016-04-21: 1 [IU] via SUBCUTANEOUS
  Administered 2016-04-22 (×3): 2 [IU] via SUBCUTANEOUS
  Administered 2016-04-24 – 2016-04-28 (×17): 1 [IU] via SUBCUTANEOUS
  Administered 2016-04-28: 2 [IU] via SUBCUTANEOUS
  Administered 2016-04-29 – 2016-04-30 (×7): 1 [IU] via SUBCUTANEOUS
  Administered 2016-04-30: 2 [IU] via SUBCUTANEOUS

## 2016-04-14 MED ORDER — INSULIN DETEMIR 100 UNIT/ML ~~LOC~~ SOLN
5.0000 [IU] | Freq: Every day | SUBCUTANEOUS | Status: DC
  Administered 2016-04-14: 5 [IU] via SUBCUTANEOUS
  Filled 2016-04-14 (×2): qty 0.05

## 2016-04-14 MED ORDER — PANTOPRAZOLE SODIUM 40 MG PO PACK
40.0000 mg | PACK | Freq: Two times a day (BID) | ORAL | Status: DC
  Administered 2016-04-14 – 2016-04-30 (×22): 40 mg
  Filled 2016-04-14 (×22): qty 20

## 2016-04-14 NOTE — Progress Notes (Signed)
Follow up - Trauma and Critical Care  Patient Details:    [name] is an 55 y.o. male.  Lines/tubes : Airway 7.5 mm (Active)  Secured at (cm) 25 cm 04/14/2016  7:38 AM  Measured From Lips 04/14/2016  7:38 AM  Secured Location Right 04/14/2016  7:38 AM  Secured By [employer] 04/14/2016  7:38 AM  Tube Holder Repositioned Yes 04/14/2016  7:38 AM  Cuff Pressure (cm H2O) 22 cm H2O 04/14/2016  4:29 AM  Site Condition Dry 04/14/2016  7:38 AM     PICC Triple Lumen [id number] PICC Right Basilic 43 cm 0 cm (Active)  Indication for Insertion or Continuance of Line Prolonged intravenous therapies;Limited venous access - need for IV therapy >5 days (PICC only) 04/14/2016  8:00 AM  Exposed Catheter (cm) 0 cm 04/06/2016 11:54 AM  Site Assessment Clean;Dry;Intact 04/14/2016  8:00 AM  Lumen #1 Status Blood return noted;Flushed;Saline locked 04/14/2016  8:00 AM  Lumen #2 Status Blood return noted;Flushed;Infusing 04/14/2016  8:00 AM  Lumen #3 Status Blood return noted;Flushed;Infusing 04/14/2016  8:00 AM  Dressing Type Transparent;Occlusive 04/14/2016  8:00 AM  Dressing Status Clean;Dry;Intact;Antimicrobial disc in place 04/14/2016  8:00 AM  Line Care Tubing changed 04/13/2016  6:00 AM  Dressing Intervention Dressing changed;Antimicrobial disc changed 04/13/2016  4:00 PM  Dressing Change Due 04/20/16 04/14/2016  8:00 AM     Negative Pressure Wound Therapy Ankle Left;Medial;Lateral (Active)  Site / Wound Assessment Dressing in place / Unable to assess 04/14/2016  7:54 AM  Target Pressure (mmHg) 100 04/12/2016  8:00 AM  Instillation Volume 125 mL 04/13/2016  8:00 PM  Output (mL) 0 mL 04/14/2016  6:00 AM     Negative Pressure Wound Therapy Ankle Right;Medial;Lateral (Active)  Site / Wound Assessment Dressing in place / Unable to assess 04/14/2016  7:54 AM  Target Pressure (mmHg) 100 04/14/2016  7:54 AM  Instillation Volume 100 mL 04/10/2016  6:00 PM  Output (mL) 0 mL 04/14/2016  6:00 AM     NG/OG Tube  Orogastric 16 Fr. Center mouth Xray (Active)  Site Assessment Clean;Dry;Intact 04/14/2016  8:00 AM  Ongoing Placement Verification Auscultation 04/14/2016  8:00 AM  Status Infusing tube feed 04/14/2016  8:00 AM  Drainage Appearance Clear;Mucus;Tan 04/07/2016  8:00 AM  Intake (mL) 60 mL 04/14/2016  6:00 AM     Urethral Catheter Darla Anastasia Fiedler RN Non-latex;Temperature probe 16 Fr. (Active)  Indication for Insertion or Continuance of Catheter Peri-operative use for selective surgical procedure 04/14/2016  7:54 AM  Site Assessment Clean;Intact 04/14/2016  7:54 AM  Catheter Maintenance Bag below level of bladder;Catheter secured;Drainage bag/tubing not touching floor;Insertion date on drainage bag;No dependent loops;Seal intact 04/14/2016  7:54 AM  Collection Container Standard drainage bag 04/14/2016  7:54 AM  Securement Method Securing device (Describe) 04/14/2016  7:54 AM  Urinary Catheter Interventions Unclamped 04/14/2016  7:54 AM  Output (mL) 275 mL 04/14/2016 11:00 AM    Microbiology/Sepsis markers: Results for orders placed or performed during the hospital encounter of 04/05/16  MRSA PCR Screening     Status: None   Collection Time: 04/05/16 10:19 PM  Result Value Ref Range Status   MRSA by PCR NEGATIVE NEGATIVE Final    Comment:        The GeneXpert MRSA Assay (FDA approved for NASAL specimens only), is one component of a comprehensive MRSA colonization surveillance program. It is not intended to diagnose MRSA infection nor to guide or monitor treatment for MRSA infections.   Culture, respiratory (  NON-Expectorated)     Status: None   Collection Time: 04/09/16 11:31 PM  Result Value Ref Range Status   Specimen Description TRACHEAL ASPIRATE  Final   Special Requests NONE  Final   Gram Stain   Final    MODERATE WBC PRESENT,BOTH PMN AND MONONUCLEAR NO ORGANISMS SEEN    Culture Consistent with normal respiratory flora.  Final   Report Status 04/13/2016 FINAL  Final     Anti-infectives:  Anti-infectives    Start     Dose/Rate Route Frequency Ordered Stop   04/09/16 2200  ceFAZolin (ANCEF) IVPB 1 g/50 mL premix     1 g 100 mL/hr over 30 Minutes Intravenous Every 8 hours 04/09/16 2038 04/13/16 1800   04/09/16 1914  ceFAZolin (ANCEF) 2-4 GM/100ML-% IVPB    Comments:  Trixie Deis   : cabinet override      04/09/16 1914 04/10/16 0729   04/09/16 1720  vancomycin (VANCOCIN) powder  Status:  Discontinued       As needed 04/09/16 1721 04/09/16 2013   04/06/16 0600  ceFAZolin (ANCEF) IVPB 1 g/50 mL premix  Status:  Discontinued     1 g 100 mL/hr over 30 Minutes Intravenous Every 8 hours 04/06/16 0206 04/09/16 2038   04/05/16 1729  ceFAZolin (ANCEF) 2-4 GM/100ML-% IVPB    Comments:  Rumbarger, Rachel   : cabinet override      04/05/16 1729 04/05/16 1937      Best Practice/Protocols:  VTE Prophylaxis: Mechanical Intermittent Sedation  Consults: Treatment Team:  Newman Pies, MD Altamese Doniphan, MD    Events:  Subjective:    Overnight Issues: Hypertensive requiring PRNs,   Objective:  Vital signs for last 24 hours: Temp:  [99.9 F (37.7 C)-101.1 F (38.4 C)] 100.2 F (37.9 C) (10/08 1100) Pulse Rate:  [84-122] 95 (10/08 1100) Resp:  [11-25] 14 (10/08 1100) BP: (127-180)/(66-141) 159/96 (10/08 1100) SpO2:  [96 %-100 %] 98 % (10/08 1100) FiO2 (%):  [40 %] 40 % (10/08 1100) Weight:  [104 kg (229 lb 4.5 oz)] 104 kg (229 lb 4.5 oz) (10/08 0457)  Hemodynamic parameters for last 24 hours:    Intake/Output from previous day: 10/07 0701 - 10/08 0700 In: 4276.7 [I.V.:1690; NG/GT:2361.7; IV Piggyback:100] Out: 2475 [Urine:2460; Drains:15]  Intake/Output this shift: Total I/O In: 551.8 [I.V.:291.8; NG/GT:260] Out: 575 [Urine:575]  Vent settings for last 24 hours: Vent Mode: PRVC FiO2 (%):  [40 %] 40 % Set Rate:  [24 bmp] 24 bmp Vt Set:  [400 mL-600 mL] 400 mL PEEP:  [5 cmH20] 5 cmH20 Plateau Pressure:  [14 cmH20-22 cmH20] 18  cmH20  Physical Exam:  Gen: intubated, sedated HEENT: tubes in good position, collar in place Resp: coarse b/l, weaning trial ongoing Cardiovascular: tachycardic Abdomen: soft, Nt, ND Ext: LLE ex fix in place, RLE in ace wrap Neuro: GCS 6t, no movement LUE, LLE, RLE, moves RUE freely, not following commands  Results for orders placed or performed during the hospital encounter of 04/05/16 (from the past 24 hour(s))  Glucose, capillary     Status: Abnormal   Collection Time: 04/13/16 12:18 PM  Result Value Ref Range   Glucose-Capillary 193 (H) 65 - 99 mg/dL  Glucose, capillary     Status: Abnormal   Collection Time: 04/13/16  4:10 PM  Result Value Ref Range   Glucose-Capillary 161 (H) 65 - 99 mg/dL  Glucose, capillary     Status: Abnormal   Collection Time: 04/13/16  7:24 PM  Result Value Ref  Range   Glucose-Capillary 167 (H) 65 - 99 mg/dL  Glucose, capillary     Status: Abnormal   Collection Time: 04/13/16 11:11 PM  Result Value Ref Range   Glucose-Capillary 173 (H) 65 - 99 mg/dL  Glucose, capillary     Status: Abnormal   Collection Time: 04/14/16  3:38 AM  Result Value Ref Range   Glucose-Capillary 202 (H) 65 - 99 mg/dL  CBC     Status: Abnormal   Collection Time: 04/14/16  5:09 AM  Result Value Ref Range   WBC 12.4 (H) 4.0 - 10.5 K/uL   RBC 3.26 (L) 4.22 - 5.81 MIL/uL   Hemoglobin 9.3 (L) 13.0 - 17.0 g/dL   HCT 31.6 (L) 39.0 - 52.0 %   MCV 96.9 78.0 - 100.0 fL   MCH 28.5 26.0 - 34.0 pg   MCHC 29.4 (L) 30.0 - 36.0 g/dL   RDW 15.0 11.5 - 15.5 %   Platelets 269 150 - 400 K/uL  Basic metabolic panel     Status: Abnormal   Collection Time: 04/14/16  5:09 AM  Result Value Ref Range   Sodium 150 (H) 135 - 145 mmol/L   Potassium 3.6 3.5 - 5.1 mmol/L   Chloride 116 (H) 101 - 111 mmol/L   CO2 29 22 - 32 mmol/L   Glucose, Bld 183 (H) 65 - 99 mg/dL   BUN 25 (H) 6 - 20 mg/dL   Creatinine, Ser 0.57 (L) 0.61 - 1.24 mg/dL   Calcium 8.6 (L) 8.9 - 10.3 mg/dL   GFR calc non Af  Amer >60 >60 mL/min   GFR calc Af Amer >60 >60 mL/min   Anion gap 5 5 - 15  Glucose, capillary     Status: Abnormal   Collection Time: 04/14/16  8:23 AM  Result Value Ref Range   Glucose-Capillary 196 (H) 65 - 99 mg/dL     Assessment/Plan:   [facility]  [facility]   Plan: neuro stable, continue supportive measures  PULM  resp failure related to injury   Plan: continue weaning protocol  CARDIO  Tachycardic, hypertensive   Plan: increase metoprolol  RENAL  Hypernatremia stable   Plan: continue free water replacement  GI  No issues   Plan: continue G tube feeds at goal  ID  No issues   Plan: continue excellent nursing preventive care  HEME  [facility]   Plan: mech VTE proph to bicep or thigh  ENDO No issues, chronic DM   Plan: serial labs for system injury, SSI for hyperglycemia  Global Issues      LOS: 9 days   Additional comments:I reviewed the patient's new clinical lab test results. Na 150 and I reviewed the patients new imaging test results. XR stable  Critical Care Total Time*: 15 Minutes  Arta Bruce Tabias Swayze 04/14/2016  *Care during the described time interval was provided by me and/or other providers on the critical care team.  I have reviewed this patient's available data, including medical history, events of note, physical examination and test results as part of my evaluation.

## 2016-04-15 ENCOUNTER — Inpatient Hospital Stay (HOSPITAL_COMMUNITY): Payer: No Typology Code available for payment source

## 2016-04-15 DIAGNOSIS — J9600 Acute respiratory failure, unspecified whether with hypoxia or hypercapnia: Secondary | ICD-10-CM | POA: Diagnosis present

## 2016-04-15 DIAGNOSIS — S15102A Unspecified injury of left vertebral artery, initial encounter: Secondary | ICD-10-CM | POA: Diagnosis present

## 2016-04-15 DIAGNOSIS — S27322A Contusion of lung, bilateral, initial encounter: Secondary | ICD-10-CM | POA: Diagnosis present

## 2016-04-15 DIAGNOSIS — S069X3A Unspecified intracranial injury with loss of consciousness of 1 hour to 5 hours 59 minutes, initial encounter: Secondary | ICD-10-CM | POA: Diagnosis present

## 2016-04-15 DIAGNOSIS — D62 Acute posthemorrhagic anemia: Secondary | ICD-10-CM | POA: Diagnosis not present

## 2016-04-15 DIAGNOSIS — S92902B Unspecified fracture of left foot, initial encounter for open fracture: Secondary | ICD-10-CM | POA: Diagnosis present

## 2016-04-15 DIAGNOSIS — S064X9A Epidural hemorrhage with loss of consciousness of unspecified duration, initial encounter: Secondary | ICD-10-CM | POA: Diagnosis present

## 2016-04-15 DIAGNOSIS — S2243XA Multiple fractures of ribs, bilateral, initial encounter for closed fracture: Secondary | ICD-10-CM | POA: Diagnosis present

## 2016-04-15 DIAGNOSIS — S12200A Unspecified displaced fracture of third cervical vertebra, initial encounter for closed fracture: Secondary | ICD-10-CM | POA: Diagnosis present

## 2016-04-15 DIAGNOSIS — S064XAA Epidural hemorrhage with loss of consciousness status unknown, initial encounter: Secondary | ICD-10-CM

## 2016-04-15 DIAGNOSIS — I10 Essential (primary) hypertension: Secondary | ICD-10-CM | POA: Diagnosis present

## 2016-04-15 DIAGNOSIS — R739 Hyperglycemia, unspecified: Secondary | ICD-10-CM | POA: Diagnosis not present

## 2016-04-15 DIAGNOSIS — S12400A Unspecified displaced fracture of fifth cervical vertebra, initial encounter for closed fracture: Secondary | ICD-10-CM | POA: Diagnosis present

## 2016-04-15 DIAGNOSIS — S92901A Unspecified fracture of right foot, initial encounter for closed fracture: Secondary | ICD-10-CM | POA: Diagnosis present

## 2016-04-15 DIAGNOSIS — S82401A Unspecified fracture of shaft of right fibula, initial encounter for closed fracture: Secondary | ICD-10-CM | POA: Diagnosis present

## 2016-04-15 DIAGNOSIS — S12100A Unspecified displaced fracture of second cervical vertebra, initial encounter for closed fracture: Secondary | ICD-10-CM | POA: Diagnosis present

## 2016-04-15 HISTORY — DX: Acute respiratory failure, unspecified whether with hypoxia or hypercapnia: J96.00

## 2016-04-15 HISTORY — DX: Multiple fractures of ribs, bilateral, initial encounter for closed fracture: S22.43XA

## 2016-04-15 HISTORY — DX: Epidural hemorrhage with loss of consciousness status unknown, initial encounter: S06.4XAA

## 2016-04-15 HISTORY — DX: Epidural hemorrhage with loss of consciousness of unspecified duration, initial encounter: S06.4X9A

## 2016-04-15 LAB — GLUCOSE, CAPILLARY
Glucose-Capillary: 129 mg/dL — ABNORMAL HIGH (ref 65–99)
Glucose-Capillary: 131 mg/dL — ABNORMAL HIGH (ref 65–99)
Glucose-Capillary: 132 mg/dL — ABNORMAL HIGH (ref 65–99)
Glucose-Capillary: 138 mg/dL — ABNORMAL HIGH (ref 65–99)
Glucose-Capillary: 144 mg/dL — ABNORMAL HIGH (ref 65–99)
Glucose-Capillary: 147 mg/dL — ABNORMAL HIGH (ref 65–99)

## 2016-04-15 LAB — CBC WITH DIFFERENTIAL/PLATELET
Basophils Absolute: 0 10*3/uL (ref 0.0–0.1)
Basophils Relative: 0 %
Eosinophils Absolute: 0.3 10*3/uL (ref 0.0–0.7)
Eosinophils Relative: 2 %
HCT: 31.4 % — ABNORMAL LOW (ref 39.0–52.0)
Hemoglobin: 9.2 g/dL — ABNORMAL LOW (ref 13.0–17.0)
Lymphocytes Relative: 14 %
Lymphs Abs: 1.8 10*3/uL (ref 0.7–4.0)
MCH: 29.1 pg (ref 26.0–34.0)
MCHC: 29.3 g/dL — ABNORMAL LOW (ref 30.0–36.0)
MCV: 99.4 fL (ref 78.0–100.0)
Monocytes Absolute: 1.3 10*3/uL — ABNORMAL HIGH (ref 0.1–1.0)
Monocytes Relative: 10 %
Neutro Abs: 9.9 10*3/uL — ABNORMAL HIGH (ref 1.7–7.7)
Neutrophils Relative %: 74 %
Platelets: 310 10*3/uL (ref 150–400)
RBC: 3.16 MIL/uL — ABNORMAL LOW (ref 4.22–5.81)
RDW: 15.3 % (ref 11.5–15.5)
WBC: 13.2 10*3/uL — ABNORMAL HIGH (ref 4.0–10.5)

## 2016-04-15 LAB — BASIC METABOLIC PANEL
Anion gap: 6 (ref 5–15)
BUN: 29 mg/dL — ABNORMAL HIGH (ref 6–20)
CO2: 29 mmol/L (ref 22–32)
Calcium: 9 mg/dL (ref 8.9–10.3)
Chloride: 118 mmol/L — ABNORMAL HIGH (ref 101–111)
Creatinine, Ser: 0.65 mg/dL (ref 0.61–1.24)
GFR calc Af Amer: >60 mL/min (ref >60)
GFR calc non Af Amer: >60 mL/min (ref >60)
Glucose, Bld: 145 mg/dL — ABNORMAL HIGH (ref 65–99)
Potassium: 3.4 mmol/L — ABNORMAL LOW (ref 3.5–5.1)
Sodium: 153 mmol/L — ABNORMAL HIGH (ref 135–145)

## 2016-04-15 MED ORDER — ENALAPRIL MALEATE 5 MG PO TABS
5.0000 mg | ORAL_TABLET | Freq: Every day | ORAL | Status: DC
  Administered 2016-04-15 – 2016-04-30 (×11): 5 mg
  Filled 2016-04-15 (×17): qty 1

## 2016-04-15 MED ORDER — ENALAPRIL 1 MG/ML SUSP
5.0000 mg | Freq: Every day | ORAL | Status: DC
Start: 2016-04-15 — End: 2016-04-15

## 2016-04-15 MED ORDER — ENOXAPARIN SODIUM 30 MG/0.3ML ~~LOC~~ SOLN
30.0000 mg | Freq: Two times a day (BID) | SUBCUTANEOUS | Status: DC
  Administered 2016-04-15 – 2016-04-30 (×30): 30 mg via SUBCUTANEOUS
  Filled 2016-04-15 (×30): qty 0.3

## 2016-04-15 MED ORDER — INSULIN DETEMIR 100 UNIT/ML ~~LOC~~ SOLN
7.0000 [IU] | Freq: Every day | SUBCUTANEOUS | Status: DC
  Administered 2016-04-15 – 2016-04-30 (×13): 7 [IU] via SUBCUTANEOUS
  Filled 2016-04-15 (×17): qty 0.07

## 2016-04-15 MED ORDER — FENTANYL CITRATE (PF) 100 MCG/2ML IJ SOLN
25.0000 ug | INTRAMUSCULAR | Status: DC | PRN
  Administered 2016-04-15 – 2016-04-16 (×8): 50 ug via INTRAVENOUS
  Administered 2016-04-20 (×2): 25 ug via INTRAVENOUS
  Filled 2016-04-15 (×9): qty 2

## 2016-04-15 NOTE — Progress Notes (Signed)
Patient ID: [name], male   DOB: 07-02-1961, 55 y.o.   MRN: [id number]   LOS: 10 days   Subjective: Weaning well, +FC with right hand   Objective: Vital signs in last 24 hours: Temp:  [100 F (37.8 C)-101.1 F (38.4 C)] 100.6 F (38.1 C) (10/09 0600) Pulse Rate:  [84-122] 100 (10/09 0737) Resp:  [12-24] 24 (10/09 0737) BP: (104-178)/(52-101) 136/66 (10/09 0737) SpO2:  [85 %-100 %] 96 % (10/09 0737) FiO2 (%):  [40 %-50 %] 40 % (10/09 0745) Weight:  [103.8 kg (228 lb 13.4 oz)] 103.8 kg (228 lb 13.4 oz) (10/09 0500) Last BM Date: 04/14/16   VENT: CPAP/PS 5/5   UOP: 34ml/h NET: +1360ml/24h TOTAL: +14237ml/admission   Laboratory CBC  Recent Labs  04/14/16 0509 04/15/16 0731  WBC 12.4* 13.2*  HGB 9.3* 9.2*  HCT 31.6* 31.4*  PLT 269 310   BMET  Recent Labs  04/14/16 0509 04/15/16 0731  NA 150* 153*  K 3.6 3.4*  CL 116* 118*  CO2 29 29  GLUCOSE 183* 145*  BUN 25* 29*  CREATININE 0.57* 0.65  CALCIUM 8.6* 9.0   CBG (last 3)   Recent Labs  04/14/16 1916 04/14/16 2312 04/15/16 0314  GLUCAP 171* 209* 138*    Radiology PORTABLE CHEST 1 VIEW  COMPARISON:  Portable chest x-ray of April 14, 2016  FINDINGS: The lungs are slightly less well inflated today. The interstitial markings remain increased. Subsegmental atelectasis at the lung bases is suspected. There is no large pleural effusion and no pneumothorax. The cardiac silhouette is enlarged. The pulmonary vascularity is not clearly engorged. The endotracheal tube tip lies 5 cm above the carina. The esophagogastric tube tip projects below the inferior margin of the image. The right-sided PICC line tip projects at the cavoatrial junction.  IMPRESSION: Slight increased hypoinflation with bibasilar subsegmental atelectasis. Stable cardiomegaly without significant pulmonary edema. Stable multiple left-sided rib fractures. No pneumothorax. The support tubes are in reasonable  position.  Aortic atherosclerosis   Electronically Signed   By: David  Martinique M.D.   On: 04/15/2016 07:27   Physical Exam General appearance: alert and no distress Resp: clear to auscultation bilaterally Cardio: Mild tachycardia GI: Soft, +BS Extremities: WArm   Assessment/Plan: [facility] TBI/SAH/IVH/SDH - Dr. Arnoldo Morale following C2, C3, C5 FXs with EDH - collar per Dr. Arnoldo Morale Left vert art injury Rib FXs L 2-7, R 2,6,11; B pulm contusions -- Pulmonary toilet Vent dependent resp failure - Plan extubation this morning after TF off a while Bilateral talus and calcaneus FXs s/p ORIF, ex fix - per Dr. [name], ex fix to remain for 8 weeks R prox fibula FX - per Dr. Ninfa Linden HTN - Add ACEI ABL anemia - Stable VTE - Lovenox  FEN - Increase Levemir slightly Dispo - ICU  Critical care time: 0755 -- 0825    Lisette Abu, PA-C Pager: [phone] General Trauma PA Pager: [phone]  04/15/2016

## 2016-04-15 NOTE — Procedures (Signed)
Extubation Procedure Note  Patient Details:   Name: [name] DOB: May 21, 1961 MRN: [id number]   Airway Documentation:  Airway 7.5 mm (Active)  Secured at (cm) 25 cm 04/15/2016  8:00 AM  Measured From Lips 04/15/2016  8:00 AM  Secured Location Right 04/15/2016  8:00 AM  Secured By [employer] 04/15/2016  8:00 AM  Tube Holder Repositioned Yes 04/15/2016  7:37 AM  Cuff Pressure (cm H2O) 26 cm H2O 04/15/2016  4:08 AM  Site Condition Dry 04/15/2016  8:00 AM    Evaluation  O2 sats: stable throughout Complications: No apparent complications Patient did tolerate procedure well. Bilateral Breath Sounds: Clear, Diminished   No   Pt extubated to 4L nasal cannula.  No stridor noted.  RN at bedside.  Donnetta Hail 04/15/2016, 11:03 AM

## 2016-04-15 NOTE — Progress Notes (Signed)
[facility] Progress Note  Subjective  More awake today  Moving right arm freely MRI neck demonstrates L vertebral artery injury   Review of Systems  Unable to perform ROS: Intubated     Objective   BP (!) 156/74 (BP Location: Left Arm)   Pulse (!) 115   Temp (!) 100.6 F (38.1 C) (Core (Comment)) Comment (Src): bladder  Resp 20   Ht 5' 11"  (1.803 m)   Wt 103.8 kg (228 lb 13.4 oz)   SpO2 97%   BMI 31.92 kg/m   Intake/Output      10/08 0701 - 10/09 0700 10/09 0701 - 10/10 0700   P.O.  0   I.V. (mL/kg) 1791.8 (17.3) 70 (0.7)   Other     NG/GT 1960 65   IV Piggyback     Total Intake(mL/kg) 3751.8 (36.1) 135 (1.3)   Urine (mL/kg/hr) 2235 (0.9) 125 (0.6)   Drains 20 (0)    Stool 0 (0)    Total Output 2255 125   Net +1496.8 +10        Stool Occurrence 2 x      Labs  Results for [name], [name] (MRN [id number]) as of 04/15/2016 09:03  Ref. Range 04/15/2016 07:31  Sodium Latest Ref Range: 135 - 145 mmol/L 153 (H)  Potassium Latest Ref Range: 3.5 - 5.1 mmol/L 3.4 (L)  Chloride Latest Ref Range: 101 - 111 mmol/L 118 (H)  CO2 Latest Ref Range: 22 - 32 mmol/L 29  BUN Latest Ref Range: 6 - 20 mg/dL 29 (H)  Creatinine Latest Ref Range: 0.61 - 1.24 mg/dL 0.65  Calcium Latest Ref Range: 8.9 - 10.3 mg/dL 9.0  EGFR (Non-African Amer.) Latest Ref Range: >60 mL/min >60  EGFR (African American) Latest Ref Range: >60 mL/min >60  Glucose Latest Ref Range: 65 - 99 mg/dL 145 (H)  Anion gap Latest Ref Range: 5 - 15  6  WBC Latest Ref Range: 4.0 - 10.5 K/uL 13.2 (H)  RBC Latest Ref Range: 4.22 - 5.81 MIL/uL 3.16 (L)  Hemoglobin Latest Ref Range: 13.0 - 17.0 g/dL 9.2 (L)  HCT Latest Ref Range: 39.0 - 52.0 % 31.4 (L)  MCV Latest Ref Range: 78.0 - 100.0 fL 99.4  MCH Latest Ref Range: 26.0 - 34.0 pg 29.1  MCHC Latest Ref Range: 30.0 - 36.0 g/dL 29.3 (L)  RDW Latest Ref Range: 11.5 - 15.5 % 15.3  Platelets Latest Ref Range: 150 - 400 K/uL 310  Neutrophils Latest Units:  % 74  Lymphocytes Latest Units: % 14  Monocytes Relative Latest Units: % 10  Eosinophil Latest Units: % 2  Basophil Latest Units: % 0  NEUT# Latest Ref Range: 1.7 - 7.7 K/uL 9.9 (H)  Lymphocyte # Latest Ref Range: 0.7 - 4.0 K/uL 1.8  Monocyte # Latest Ref Range: 0.1 - 1.0 K/uL 1.3 (H)  Eosinophils Absolute Latest Ref Range: 0.0 - 0.7 K/uL 0.3  Basophils Absolute Latest Ref Range: 0.0 - 0.1 K/uL 0.0    Exam  Gen: intubated, eyes open. Moving right arm quite freely at fingers, wrist and elbow. Only moving fingers on left. Can appreciate extensors in forearm firing   Ext:       Right Lower Extremity   Splint c/d/i  Not moving toes  Ext warm  + DP pulse  Swelling stable  VAC functioning        Left Lower Extremity   Ex fix stable  VAC functioning   Ext warm  +  DP pulse  Not moving toes  5th toe has some slight pressure injury to the lateral aspect of the distal toe. There is also a small blister medially    Assessment and Plan   POD/HD#: 35   55 y/o white male s/p [facility]   - [facility]   -bilateral talo-calcaneal fracture dislocations and L talonavicular dislocation,              s/p I&D, open reduction, pinning R ankle/subtalar joint, Ex fix Left ankle and pinning subtalar joint and tibiotalarjoint- 04/09/2016             S/p reapeat I&D and closure wounds B feet- 04/11/2016               VAC removal tomorrow              Ex fix and pinning to be definitive procedure for this hospitalization as long as there are no wound/soft tissue complications             Anticipate pt remaining in ex fix x 8 weeks or so             He will eventually need hind foot fusion nail      Physiologically from ortho standpoint appears to be stable however, Mildly elevated WBC (13.2k) count today, it has trended up over the last two days, mild fever as well with Tmax of 101.1.  Pt also tachy and hypertensive.  His kidney function shows mildly elevated BUN but stable creatinine.   He completed abx  coverage for open fx treatment Saturday afternoon. He had been on IV ancef from admission until pm of 04/13/2016.    Continue to monitor vitals and labs.  Plan for dressing change tomorrow to evaluate soft tissue. Viability of soft tissue envelope is of concern. If evidence of infection on dressing change or pt demonstrates that he is becoming septic we will then proceed with BKA of causative limb      - C spine fracture              Per NS  - L vertebral artery injury   Per TS/NS   - ? Flail Left upper extremity             No evidence of brachial plexus injury  Moving only fingers this am but extensors in forearm noted to be firing    - Pain management:             Per TS  - FEN  Hypernatremia   Per TS   - ABL anemia/Hemodynamics            stable              CBC with diff in am      - DVT/PE prophylaxis:             on lovenox      - Dispo:             continue per TS and NS             Ortho issues stabilized   Dressing changes tomorrow     Jari Pigg, PA-C [facility] [phone] ([phone] ([phone]) 04/15/2016 9:02 AM
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
## 2016-04-15 NOTE — Progress Notes (Signed)
Wasted 34ml fentanyl gtt with [place] [facility] RN

## 2016-04-16 ENCOUNTER — Inpatient Hospital Stay (HOSPITAL_COMMUNITY): Payer: No Typology Code available for payment source

## 2016-04-16 LAB — BASIC METABOLIC PANEL
Anion gap: 8 (ref 5–15)
BUN: 25 mg/dL — ABNORMAL HIGH (ref 6–20)
CO2: 26 mmol/L (ref 22–32)
Calcium: 8.2 mg/dL — ABNORMAL LOW (ref 8.9–10.3)
Chloride: 119 mmol/L — ABNORMAL HIGH (ref 101–111)
Creatinine, Ser: 0.54 mg/dL — ABNORMAL LOW (ref 0.61–1.24)
GFR calc Af Amer: >60 mL/min (ref >60)
GFR calc non Af Amer: >60 mL/min (ref >60)
Glucose, Bld: 128 mg/dL — ABNORMAL HIGH (ref 65–99)
Potassium: 2.7 mmol/L — CL (ref 3.5–5.1)
Sodium: 153 mmol/L — ABNORMAL HIGH (ref 135–145)

## 2016-04-16 LAB — GLUCOSE, CAPILLARY
Glucose-Capillary: 117 mg/dL — ABNORMAL HIGH (ref 65–99)
Glucose-Capillary: 122 mg/dL — ABNORMAL HIGH (ref 65–99)
Glucose-Capillary: 136 mg/dL — ABNORMAL HIGH (ref 65–99)
Glucose-Capillary: 162 mg/dL — ABNORMAL HIGH (ref 65–99)
Glucose-Capillary: 165 mg/dL — ABNORMAL HIGH (ref 65–99)
Glucose-Capillary: 169 mg/dL — ABNORMAL HIGH (ref 65–99)

## 2016-04-16 LAB — CBC WITH DIFFERENTIAL/PLATELET
Basophils Absolute: 0 10*3/uL (ref 0.0–0.1)
Basophils Relative: 0 %
Eosinophils Absolute: 0.1 10*3/uL (ref 0.0–0.7)
Eosinophils Relative: 1 %
HCT: 29.6 % — ABNORMAL LOW (ref 39.0–52.0)
Hemoglobin: 8.8 g/dL — ABNORMAL LOW (ref 13.0–17.0)
Lymphocytes Relative: 13 %
Lymphs Abs: 1.4 10*3/uL (ref 0.7–4.0)
MCH: 28.9 pg (ref 26.0–34.0)
MCHC: 29.7 g/dL — ABNORMAL LOW (ref 30.0–36.0)
MCV: 97.4 fL (ref 78.0–100.0)
Monocytes Absolute: 0.8 10*3/uL (ref 0.1–1.0)
Monocytes Relative: 7 %
Neutro Abs: 8.6 10*3/uL — ABNORMAL HIGH (ref 1.7–7.7)
Neutrophils Relative %: 79 %
Platelets: 270 10*3/uL (ref 150–400)
RBC: 3.04 MIL/uL — ABNORMAL LOW (ref 4.22–5.81)
RDW: 14.8 % (ref 11.5–15.5)
WBC: 10.9 10*3/uL — ABNORMAL HIGH (ref 4.0–10.5)

## 2016-04-16 MED ORDER — POTASSIUM CHLORIDE 20 MEQ/15ML (10%) PO SOLN
40.0000 meq | Freq: Once | ORAL | Status: DC
  Filled 2016-04-16: qty 30

## 2016-04-16 MED ORDER — POTASSIUM CHLORIDE 10 MEQ/50ML IV SOLN
10.0000 meq | INTRAVENOUS | Status: AC
  Administered 2016-04-16 (×4): 10 meq via INTRAVENOUS
  Filled 2016-04-16 (×4): qty 50

## 2016-04-16 NOTE — Progress Notes (Signed)
CRITICAL VALUE ALERT  Critical value received: Potassium 2.7  Date of notification:  04/16/16  Time of notification:  0500  Critical value read back:Yes.    Nurse who received alert:  Adin Hector  MD notified (1st page):  Trauma on call  Time of first page:  0502  Responding MD:  Dr. Kieth Brightly  Time MD responded:  [phone]

## 2016-04-16 NOTE — Progress Notes (Signed)
RN contacted Jarrett Soho RN on coretrac team for placement of coretrac tube.
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
## 2016-04-16 NOTE — Progress Notes (Signed)
Patient more responsive, able to move legs and will follow some commands. Coretrac placed and tube feeds restarted. Wife at the bedside at this time. Vital signs are stable. PRN medication given once for pain during dressing changes. No questions or concerns at this time. Call bell in reach, will continue to monitor.
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
## 2016-04-16 NOTE — Progress Notes (Signed)
On assessment this morning patient is more responsive. Able to squeeze and release on right hand as well as show two fingers and give a thumbs up. Able to wiggle toes on right, possible movement to left leg. Left hand less movement, attempted to squeeze fingers but very weak.

## 2016-04-16 NOTE — Progress Notes (Signed)
Orthopedic Tech Progress Note Patient Details:  GLYNN NGAI [birth year] [id number]  Ortho Devices Type of Ortho Device: Ace wrap, Post (short leg) splint, Stirrup splint Ortho Device/Splint Location: rle Ortho Device/Splint Interventions: Application   Elleigh Cassetta 04/16/2016, 10:34 AM As ordered by PA [name]
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
## 2016-04-16 NOTE — Consult Note (Signed)
SLP Cancellation Note  Patient Details Name: [name] MRN: [id number] DOB: [birth year]   Cancelled treatment:        Orders received for BSE and SLE. Consulted with RN, who reports pt currently insufficiently arousable for evaluation, and requests holding evaluation until tomorrow, at which time pt will hopefully be more appropriate for evaluation.  [name] 04/16/2016, 9:45 AM   Enriqueta Shutter. Chance, [place], [place]

## 2016-04-16 NOTE — Progress Notes (Signed)
Rehab Admissions Coordinator Note:  Patient was screened by [name] for appropriateness for an Inpatient Acute Rehab Consult.  At this time, we are recommending Inpatient Rehab consult.  [name] 04/16/2016, 12:45 PM  I can be reached at [phone].
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
## 2016-04-16 NOTE — Progress Notes (Signed)
[facility] Progress Note  Subjective  Moving R arm > L arm Did not appreciate significant LEx motion   Review of Systems  Unable to perform ROS: Patient nonverbal     Objective   BP (!) 152/83   Pulse 86   Temp 98.5 F (36.9 C) (Axillary)   Resp (!) 26   Ht 5' 11"  (1.803 m)   Wt 101.3 kg (223 lb 5.2 oz)   SpO2 92%   BMI 31.15 kg/m   Intake/Output      10/09 0701 - 10/10 0700 10/10 0701 - 10/11 0700   P.O. 0    I.V. (mL/kg) 1183.8 (11.7)    NG/GT 320    Total Intake(mL/kg) 1503.8 (14.8)    Urine (mL/kg/hr) 2075 (0.9)    Drains     Stool     Total Output 2075     Net -571.2            Labs  Results for SAMWISE, ECKARDT (MRN [id number]) as of 04/16/2016 10:37  Ref. Range 04/16/2016 04:30  Sodium Latest Ref Range: 135 - 145 mmol/L 153 (H)  Potassium Latest Ref Range: 3.5 - 5.1 mmol/L 2.7 (LL)  Chloride Latest Ref Range: 101 - 111 mmol/L 119 (H)  CO2 Latest Ref Range: 22 - 32 mmol/L 26  BUN Latest Ref Range: 6 - 20 mg/dL 25 (H)  Creatinine Latest Ref Range: 0.61 - 1.24 mg/dL 0.54 (L)  Calcium Latest Ref Range: 8.9 - 10.3 mg/dL 8.2 (L)  EGFR (Non-African Amer.) Latest Ref Range: >60 mL/min >60  EGFR (African American) Latest Ref Range: >60 mL/min >60  Glucose Latest Ref Range: 65 - 99 mg/dL 128 (H)  Anion gap Latest Ref Range: 5 - 15  8  WBC Latest Ref Range: 4.0 - 10.5 K/uL 10.9 (H)  RBC Latest Ref Range: 4.22 - 5.81 MIL/uL 3.04 (L)  Hemoglobin Latest Ref Range: 13.0 - 17.0 g/dL 8.8 (L)  HCT Latest Ref Range: 39.0 - 52.0 % 29.6 (L)  MCV Latest Ref Range: 78.0 - 100.0 fL 97.4  MCH Latest Ref Range: 26.0 - 34.0 pg 28.9  MCHC Latest Ref Range: 30.0 - 36.0 g/dL 29.7 (L)  RDW Latest Ref Range: 11.5 - 15.5 % 14.8  Platelets Latest Ref Range: 150 - 400 K/uL 270    Exam  MWN:[id number], not verbalizing, not really following commands for me (specifically related to lower extremities) Applied painful stimuli to B thighs w/o response. Pt was moving  around during dressing changes. Not sure if related to pain at B ankles/feet or spontaneous movement  Ext:       Right Lower Extremity   Splint removed   VAC removed   Medial and lateral wounds are stable  Mild white appearance to posterior edge of medial wound but overall stable  No frank purulence  Swelling very well controlled  Ext warm  + DP pulse  Unable to assess motor or sensory functions   Soft tissue to heel stable               Left Lower Extremity   Ex fix is stable  Dressings removed and pin care performed  All pinsites look good  VAC removed  Medial wound looks fantastic  Lateral wound stable, mild erythema but no purulence, suspect erythema due to traumatized soft tissue  Unable to assess motor or sensory functions  Ext warm    + DP pusle  Swelling well controlled   Soft tissue to heel  stable         Assessment and Plan   POD/HD#: 21  55 y/o white male s/p [facility]   - [facility]   -bilateral talo-calcaneal fracture dislocations and L talonavicular dislocation,              s/p I&D, open reduction, pinning R ankle/subtalar joint, Ex fix Left ankle and pinning subtalar joint and tibiotalarjoint- 04/09/2016             S/p reapeat I&D and closure wounds B feet- 04/11/2016               VAC removed and dressings changed  New splint applied to R ankle   Soft tissue appears stable at current time   Erythema appreciate Lateral soft tissue Left foot likely related to soft tissue trauma, do not suspect infection at this time.   WBC count trending down and pt afebrile. Hold on abx for now  Next dressing change to Left foot/ankle on Thursday/friday  Daily pin care to ex fix left ankle starting tomorrow if needed   Splint to R ankle to remain on for another 7-10 days    Float B heels off bed                Ex fix and pinning to be definitive procedure for this hospitalization as long as there are no wound/soft tissue complications             Anticipate pt  remaining in ex fix x 8 weeks or so             He will eventually need hind foot fusion nail. BKA does remain an option as well but there are no clinical findings at this point to force that at this time      - C spine fracture              Per NS   - L vertebral artery injury              Per TS/NS   - ? Flail Left upper extremity       MRI of brachial plexus noted to be without significant findings  Continue per NS    - Pain management:             Per TS   - FEN             Hypernatremia, hypokalemia                          Per TS    - ABL anemia/Hemodynamics            stable      - DVT/PE prophylaxis:             on lovenox      - Dispo:             continue per TS and NS             Ortho issues stabilized              Dressing change L ankle Thursday or Friday  Will leave splint on R ankle for 7-10 days    Jari Pigg, PA-C [facility] [phone] (O) 04/16/2016 10:36 AM

## 2016-04-17 ENCOUNTER — Inpatient Hospital Stay (HOSPITAL_COMMUNITY): Payer: No Typology Code available for payment source

## 2016-04-17 ENCOUNTER — Encounter (HOSPITAL_COMMUNITY): Payer: Self-pay | Admitting: Radiology

## 2016-04-17 DIAGNOSIS — S0990XA Unspecified injury of head, initial encounter: Secondary | ICD-10-CM

## 2016-04-17 DIAGNOSIS — S12201D Unspecified nondisplaced fracture of third cervical vertebra, subsequent encounter for fracture with routine healing: Secondary | ICD-10-CM

## 2016-04-17 DIAGNOSIS — S14109A Unspecified injury at unspecified level of cervical spinal cord, initial encounter: Secondary | ICD-10-CM

## 2016-04-17 DIAGNOSIS — F1021 Alcohol dependence, in remission: Secondary | ICD-10-CM

## 2016-04-17 DIAGNOSIS — S064X9A Epidural hemorrhage with loss of consciousness of unspecified duration, initial encounter: Secondary | ICD-10-CM

## 2016-04-17 DIAGNOSIS — S92109A Unspecified fracture of unspecified talus, initial encounter for closed fracture: Secondary | ICD-10-CM

## 2016-04-17 DIAGNOSIS — S12101D Unspecified nondisplaced fracture of second cervical vertebra, subsequent encounter for fracture with routine healing: Secondary | ICD-10-CM

## 2016-04-17 DIAGNOSIS — D62 Acute posthemorrhagic anemia: Secondary | ICD-10-CM

## 2016-04-17 DIAGNOSIS — S92009A Unspecified fracture of unspecified calcaneus, initial encounter for closed fracture: Secondary | ICD-10-CM

## 2016-04-17 DIAGNOSIS — G8918 Other acute postprocedural pain: Secondary | ICD-10-CM

## 2016-04-17 DIAGNOSIS — S92002G Unspecified fracture of left calcaneus, subsequent encounter for fracture with delayed healing: Secondary | ICD-10-CM

## 2016-04-17 DIAGNOSIS — S15102D Unspecified injury of left vertebral artery, subsequent encounter: Secondary | ICD-10-CM

## 2016-04-17 DIAGNOSIS — E876 Hypokalemia: Secondary | ICD-10-CM

## 2016-04-17 DIAGNOSIS — R7303 Prediabetes: Secondary | ICD-10-CM

## 2016-04-17 DIAGNOSIS — S066X9A Traumatic subarachnoid hemorrhage with loss of consciousness of unspecified duration, initial encounter: Secondary | ICD-10-CM

## 2016-04-17 DIAGNOSIS — S14109S Unspecified injury at unspecified level of cervical spinal cord, sequela: Secondary | ICD-10-CM

## 2016-04-17 DIAGNOSIS — E870 Hyperosmolality and hypernatremia: Secondary | ICD-10-CM

## 2016-04-17 DIAGNOSIS — S2243XG Multiple fractures of ribs, bilateral, subsequent encounter for fracture with delayed healing: Secondary | ICD-10-CM

## 2016-04-17 DIAGNOSIS — S143XXD Injury of brachial plexus, subsequent encounter: Secondary | ICD-10-CM

## 2016-04-17 DIAGNOSIS — R0682 Tachypnea, not elsewhere classified: Secondary | ICD-10-CM

## 2016-04-17 DIAGNOSIS — S12401D Unspecified nondisplaced fracture of fifth cervical vertebra, subsequent encounter for fracture with routine healing: Secondary | ICD-10-CM

## 2016-04-17 DIAGNOSIS — F1010 Alcohol abuse, uncomplicated: Secondary | ICD-10-CM

## 2016-04-17 DIAGNOSIS — S0990XD Unspecified injury of head, subsequent encounter: Secondary | ICD-10-CM

## 2016-04-17 DIAGNOSIS — S92102G Unspecified fracture of left talus, subsequent encounter for fracture with delayed healing: Secondary | ICD-10-CM

## 2016-04-17 DIAGNOSIS — D72820 Lymphocytosis (symptomatic): Secondary | ICD-10-CM

## 2016-04-17 DIAGNOSIS — S2243XA Multiple fractures of ribs, bilateral, initial encounter for closed fracture: Secondary | ICD-10-CM

## 2016-04-17 DIAGNOSIS — S143XXA Injury of brachial plexus, initial encounter: Secondary | ICD-10-CM

## 2016-04-17 DIAGNOSIS — S069X9D Unspecified intracranial injury with loss of consciousness of unspecified duration, subsequent encounter: Secondary | ICD-10-CM

## 2016-04-17 DIAGNOSIS — S066X9D Traumatic subarachnoid hemorrhage with loss of consciousness of unspecified duration, subsequent encounter: Secondary | ICD-10-CM

## 2016-04-17 DIAGNOSIS — I158 Other secondary hypertension: Secondary | ICD-10-CM

## 2016-04-17 DIAGNOSIS — S92901D Unspecified fracture of right foot, subsequent encounter for fracture with routine healing: Secondary | ICD-10-CM

## 2016-04-17 DIAGNOSIS — Z419 Encounter for procedure for purposes other than remedying health state, unspecified: Secondary | ICD-10-CM

## 2016-04-17 LAB — CBC
HCT: 32.3 % — ABNORMAL LOW (ref 39.0–52.0)
Hemoglobin: 9.9 g/dL — ABNORMAL LOW (ref 13.0–17.0)
MCH: 28.9 pg (ref 26.0–34.0)
MCHC: 30.7 g/dL (ref 30.0–36.0)
MCV: 94.2 fL (ref 78.0–100.0)
Platelets: 333 10*3/uL (ref 150–400)
RBC: 3.43 MIL/uL — ABNORMAL LOW (ref 4.22–5.81)
RDW: 14.3 % (ref 11.5–15.5)
WBC: 12.7 10*3/uL — ABNORMAL HIGH (ref 4.0–10.5)

## 2016-04-17 LAB — POCT I-STAT 3, VENOUS BLOOD GAS (G3P V)
Acid-Base Excess: 4 mmol/L — ABNORMAL HIGH (ref 0.0–2.0)
Bicarbonate: 28 mmol/L (ref 20.0–28.0)
O2 Saturation: 86 %
Patient temperature: 98.5
TCO2: 29 mmol/L (ref 0–100)
pCO2, Ven: 40 mmHg — ABNORMAL LOW (ref 44.0–60.0)
pH, Ven: 7.453 — ABNORMAL HIGH (ref 7.250–7.430)
pO2, Ven: 48 mmHg — ABNORMAL HIGH (ref 32.0–45.0)

## 2016-04-17 LAB — POCT I-STAT 3, ART BLOOD GAS (G3+)
Acid-Base Excess: 5 mmol/L — ABNORMAL HIGH (ref 0.0–2.0)
Bicarbonate: 28.6 mmol/L — ABNORMAL HIGH (ref 20.0–28.0)
O2 Saturation: 97 %
Patient temperature: 98.5
TCO2: 30 mmol/L (ref 0–100)
pCO2 arterial: 37.7 mmHg (ref 32.0–48.0)
pH, Arterial: 7.487 — ABNORMAL HIGH (ref 7.350–7.450)
pO2, Arterial: 82 mmHg — ABNORMAL LOW (ref 83.0–108.0)

## 2016-04-17 LAB — GLUCOSE, CAPILLARY
Glucose-Capillary: 138 mg/dL — ABNORMAL HIGH (ref 65–99)
Glucose-Capillary: 142 mg/dL — ABNORMAL HIGH (ref 65–99)
Glucose-Capillary: 147 mg/dL — ABNORMAL HIGH (ref 65–99)
Glucose-Capillary: 156 mg/dL — ABNORMAL HIGH (ref 65–99)
Glucose-Capillary: 170 mg/dL — ABNORMAL HIGH (ref 65–99)
Glucose-Capillary: 174 mg/dL — ABNORMAL HIGH (ref 65–99)

## 2016-04-17 LAB — BASIC METABOLIC PANEL
Anion gap: 5 (ref 5–15)
BUN: 30 mg/dL — ABNORMAL HIGH (ref 6–20)
CO2: 29 mmol/L (ref 22–32)
Calcium: 8.3 mg/dL — ABNORMAL LOW (ref 8.9–10.3)
Chloride: 118 mmol/L — ABNORMAL HIGH (ref 101–111)
Creatinine, Ser: 0.54 mg/dL — ABNORMAL LOW (ref 0.61–1.24)
GFR calc Af Amer: >60 mL/min (ref >60)
GFR calc non Af Amer: >60 mL/min (ref >60)
Glucose, Bld: 159 mg/dL — ABNORMAL HIGH (ref 65–99)
Potassium: 2.8 mmol/L — ABNORMAL LOW (ref 3.5–5.1)
Sodium: 152 mmol/L — ABNORMAL HIGH (ref 135–145)

## 2016-04-17 MED ORDER — FREE WATER
200.0000 mL | Freq: Four times a day (QID) | Status: DC
  Administered 2016-04-17 – 2016-04-20 (×14): 200 mL

## 2016-04-17 NOTE — Progress Notes (Signed)
SLP Cancellation Note  Patient Details Name: [name] MRN: [id number] DOB: [birth year]   Cancelled treatment:        Pt unable to arouse with max multimodal stimuli (no eye opening). Will follow for swallow and cognitive assessments.    Houston Siren 04/17/2016, 10:02 AM  Orbie Pyo Colvin Caroli.Ed [employer] [phone]
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
## 2016-04-17 NOTE — Progress Notes (Signed)
0356 paged Dr. Cyndy Freeze regarding change in neuro status. Patient more drowsy and not able to follow commands. Orders given for stat head CT and ABG. Ditty also instructed me to call trauma to make them aware as well. Paged Dr. Barry Dienes twice with no answer.
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
## 2016-04-18 ENCOUNTER — Inpatient Hospital Stay (HOSPITAL_COMMUNITY): Payer: No Typology Code available for payment source

## 2016-04-18 LAB — GLUCOSE, CAPILLARY
Glucose-Capillary: 135 mg/dL — ABNORMAL HIGH (ref 65–99)
Glucose-Capillary: 139 mg/dL — ABNORMAL HIGH (ref 65–99)
Glucose-Capillary: 143 mg/dL — ABNORMAL HIGH (ref 65–99)
Glucose-Capillary: 153 mg/dL — ABNORMAL HIGH (ref 65–99)
Glucose-Capillary: 156 mg/dL — ABNORMAL HIGH (ref 65–99)
Glucose-Capillary: 165 mg/dL — ABNORMAL HIGH (ref 65–99)

## 2016-04-18 NOTE — Progress Notes (Signed)
Rehab admissions - I am following for potential admit to inpatient rehab once patient is medically ready.  I will contact family to begin discussions about rehab.  Call me for questions.  [phone]

## 2016-04-18 NOTE — Progress Notes (Signed)
Trauma Service Note  Subjective: Patient not very responsive at all.  Objective: Vital signs in last 24 hours: Temp:  [97.8 F (36.6 C)-99.3 F (37.4 C)] 97.8 F (36.6 C) (10/12 0800) Pulse Rate:  [28-88] 28 (10/12 0800) Resp:  [22-29] 22 (10/12 0800) BP: (128-165)/(68-120) 154/79 (10/12 0800) SpO2:  [90 %-100 %] 93 % (10/12 0800) Weight:  [99.7 kg (219 lb 12.8 oz)] 99.7 kg (219 lb 12.8 oz) (10/12 0400) Last BM Date: 04/17/16  Intake/Output from previous day: 10/11 0701 - 10/12 0700 In: 2925 [I.V.:1170; NG/GT:1755] Out: [id number] [Urine:1075] Intake/Output this shift: Total I/O In: 115 [I.V.:50; NG/GT:65] Out: 280 [Urine:280]  General: No distress.  No gag per RN.  Lungs: Diminished in the right base.  Oxygen saturations are good.  Abd: Benign and tolerating tube feedings.  Must consider PEG.  Extremities: No changes  Neuro: Not following commands.  Moves RUE spontaneously.  No gag.  Lab Results: CBC   Recent Labs  04/16/16 0430 04/17/16 0426  WBC 10.9* 12.7*  HGB 8.8* 9.9*  HCT 29.6* 32.3*  PLT 270 333   BMET  Recent Labs  04/16/16 0430 04/17/16 0426  NA 153* 152*  K 2.7* 2.8*  CL 119* 118*  CO2 26 29  GLUCOSE 128* 159*  BUN 25* 30*  CREATININE 0.54* 0.54*  CALCIUM 8.2* 8.3*   PT/INR No results for input(s): LABPROT, INR in the last 72 hours. ABG  Recent Labs  04/17/16 0413 04/17/16 0428  PHART  --  7.487*  HCO3 28.0 28.6*    Studies/Results: Ct Head Wo Contrast  Result Date: 04/17/2016 CLINICAL DATA:  55 y/o M; altered mental status. 04/13/2016 MRI brain. 04/06/2016 CT head. EXAM: CT HEAD WITHOUT CONTRAST TECHNIQUE: Contiguous axial images were obtained from the base of the skull through the vertex without intravenous contrast. COMPARISON:  None. FINDINGS: Brain: Interval partial dispersion of subarachnoid hemorrhage over the convexities. Minimal residual subdural hemorrhage along the falx. Stable trace hemorrhage within left occipital horn  of frontal ventricle. Linear hypoattenuation traversing the right frontal lobe along the course of removed ventriculostomy tract catheter. No hydrocephalus. No effacement of basilar cisterns. No evidence of new large territory infarct or new brain parenchymal hemorrhage. Vascular: No hyperdense vessel. Calcific atherosclerosis of cavernous internal carotid arteries. Skull: Interval resolution of edema and air in the right frontal scalp from intraventricular drain placement. No displaced calvarial fracture. Sinuses/Orbits: Diffuse paranasal sinus mucosal thickening and patchy opacification of ethmoid air cells. Orbits are unremarkable. Mastoid air cells are clear. Other: None. IMPRESSION: 1. Lucency traversing right frontal lobe along course of removed ventriculostomy catheter probably representing edema and gliosis. Density at the brain surface subjacent to the burr hole probably represents minimal blood products. 2. Interval partial dispersal of subarachnoid hemorrhage. Stable minimal hemorrhage along the falx and within the left lateral ventricle. Otherwise no new intracranial hemorrhage is identified. 3. No hydrocephalus or effacement of basilar cisterns. 4. Paranasal sinus disease improved from prior CT. Electronically Signed   By: Kristine Garbe M.D.   On: 04/17/2016 05:55   Dg Abd Portable 1v  Result Date: 04/16/2016 CLINICAL DATA:  Encounter for nasogastric tube placement. EXAM: PORTABLE ABDOMEN - 1 VIEW COMPARISON:  04/08/2016 FINDINGS: Feeding tube in the abdomen. The tip is in the expected location of the proximal jejunum and ligament of Treitz. Small amount of bowel gas in the upper abdomen. No gross abnormality at the lung bases. IMPRESSION: Feeding tube tip near the proximal jejunum and ligament of Treitz. Electronically Signed  By: Markus Daft M.D.   On: 04/16/2016 13:21    Anti-infectives: Anti-infectives    Start     Dose/Rate Route Frequency Ordered Stop   04/09/16 2200   ceFAZolin (ANCEF) IVPB 1 g/50 mL premix     1 g 100 mL/hr over 30 Minutes Intravenous Every 8 hours 04/09/16 2038 04/13/16 1800   04/09/16 1914  ceFAZolin (ANCEF) 2-4 GM/100ML-% IVPB    Comments:  Trixie Deis   : cabinet override      04/09/16 1914 04/10/16 0729   04/09/16 1720  vancomycin (VANCOCIN) powder  Status:  Discontinued       As needed 04/09/16 1721 04/09/16 2013   04/06/16 0600  ceFAZolin (ANCEF) IVPB 1 g/50 mL premix  Status:  Discontinued     1 g 100 mL/hr over 30 Minutes Intravenous Every 8 hours 04/06/16 0206 04/09/16 2038   04/05/16 1729  ceFAZolin (ANCEF) 2-4 GM/100ML-% IVPB    Comments:  Rumbarger, Rachel   : cabinet override      04/05/16 1729 04/05/16 1937      Assessment/Plan: s/p Procedure(s): IRRIGATION AND DEBRIDEMENT BILATERAL LOWER EXTREMITY Check CXR  Keep in ICU, not ready for SDU.  Not getting any sedation.  LOS: 13 days   Kathryne Eriksson. Dahlia Bailiff, MD, FACS [phone] Trauma Surgeon 04/18/2016
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
## 2016-04-19 ENCOUNTER — Inpatient Hospital Stay (HOSPITAL_COMMUNITY): Payer: No Typology Code available for payment source

## 2016-04-19 LAB — CBC WITH DIFFERENTIAL/PLATELET
Basophils Absolute: 0 10*3/uL (ref 0.0–0.1)
Basophils Relative: 0 %
Eosinophils Absolute: 0.2 10*3/uL (ref 0.0–0.7)
Eosinophils Relative: 2 %
HCT: 34 % — ABNORMAL LOW (ref 39.0–52.0)
Hemoglobin: 10.4 g/dL — ABNORMAL LOW (ref 13.0–17.0)
Lymphocytes Relative: 11 %
Lymphs Abs: 1.7 10*3/uL (ref 0.7–4.0)
MCH: 28.6 pg (ref 26.0–34.0)
MCHC: 30.6 g/dL (ref 30.0–36.0)
MCV: 93.4 fL (ref 78.0–100.0)
Monocytes Absolute: 1 10*3/uL (ref 0.1–1.0)
Monocytes Relative: 7 %
Neutro Abs: 12.2 10*3/uL — ABNORMAL HIGH (ref 1.7–7.7)
Neutrophils Relative %: 80 %
Platelets: 337 10*3/uL (ref 150–400)
RBC: 3.64 MIL/uL — ABNORMAL LOW (ref 4.22–5.81)
RDW: 14.7 % (ref 11.5–15.5)
WBC: 15.2 10*3/uL — ABNORMAL HIGH (ref 4.0–10.5)

## 2016-04-19 LAB — BASIC METABOLIC PANEL
Anion gap: 8 (ref 5–15)
BUN: 23 mg/dL — ABNORMAL HIGH (ref 6–20)
CO2: 27 mmol/L (ref 22–32)
Calcium: 8.6 mg/dL — ABNORMAL LOW (ref 8.9–10.3)
Chloride: 114 mmol/L — ABNORMAL HIGH (ref 101–111)
Creatinine, Ser: 0.52 mg/dL — ABNORMAL LOW (ref 0.61–1.24)
GFR calc Af Amer: >60 mL/min (ref >60)
GFR calc non Af Amer: >60 mL/min (ref >60)
Glucose, Bld: 127 mg/dL — ABNORMAL HIGH (ref 65–99)
Potassium: 3.2 mmol/L — ABNORMAL LOW (ref 3.5–5.1)
Sodium: 149 mmol/L — ABNORMAL HIGH (ref 135–145)

## 2016-04-19 LAB — GLUCOSE, CAPILLARY
Glucose-Capillary: 134 mg/dL — ABNORMAL HIGH (ref 65–99)
Glucose-Capillary: 154 mg/dL — ABNORMAL HIGH (ref 65–99)
Glucose-Capillary: 180 mg/dL — ABNORMAL HIGH (ref 65–99)
Glucose-Capillary: 110 mg/dL — ABNORMAL HIGH (ref 65–99)
Glucose-Capillary: 163 mg/dL — ABNORMAL HIGH (ref 65–99)

## 2016-04-19 LAB — CORTISOL-AM, BLOOD: Cortisol - AM: 17.7 ug/dL (ref 6.7–22.6)

## 2016-04-19 MED ORDER — POTASSIUM CHLORIDE IN NACL 20-0.45 MEQ/L-% IV SOLN
INTRAVENOUS | Status: DC
  Administered 2016-04-19: 14:00:00 via INTRAVENOUS
  Administered 2016-04-20: 50 mL via INTRAVENOUS
  Filled 2016-04-19 (×4): qty 1000

## 2016-04-19 MED ORDER — IPRATROPIUM-ALBUTEROL 0.5-2.5 (3) MG/3ML IN SOLN
3.0000 mL | Freq: Four times a day (QID) | RESPIRATORY_TRACT | Status: DC
  Administered 2016-04-19 – 2016-04-20 (×5): 3 mL via RESPIRATORY_TRACT
  Filled 2016-04-19 (×5): qty 3

## 2016-04-19 NOTE — Progress Notes (Signed)
Trauma Service Note  Subjective: Patient with eyes open spontaneously.  Will not verbalize.  Breathing heavily.  Slight grimace on his face.  Objective: Vital signs in last 24 hours: Temp:  [98.4 F (36.9 C)-99.7 F (37.6 C)] 99.7 F (37.6 C) (10/13 0800) Pulse Rate:  [29-91] 91 (10/13 0800) Resp:  [22-28] 28 (10/13 0800) BP: (135-171)/(74-133) 151/80 (10/13 0800) SpO2:  [89 %-98 %] 94 % (10/13 0800) Weight:  [99.7 kg (219 lb 12.8 oz)] 99.7 kg (219 lb 12.8 oz) (10/13 0600) Last BM Date: 04/18/16  Intake/Output from previous day: 10/12 0701 - 10/13 0700 In: 3505 [I.V.:1150; NG/GT:2355] Out: 830 [Urine:830] Intake/Output this shift: No intake/output data recorded.  General: Hard to tell if he is in any distress  Lungs: Mild to moderate wheezing bilaterally.  Sats are in the the 95% range on room air  Abd: Benign, tolerating tube feedings well.  Extremities: No changes  Neuro: Follows some commands, but not consistently.  Not moving left arm.  Lab Results: CBC   Recent Labs  04/17/16 0426  WBC 12.7*  HGB 9.9*  HCT 32.3*  PLT 333   BMET  Recent Labs  04/17/16 0426  NA 152*  K 2.8*  CL 118*  CO2 29  GLUCOSE 159*  BUN 30*  CREATININE 0.54*  CALCIUM 8.3*   PT/INR No results for input(s): LABPROT, INR in the last 72 hours. ABG  Recent Labs  04/17/16 0413 04/17/16 0428  PHART  --  7.487*  HCO3 28.0 28.6*    Studies/Results: Dg Chest Port 1 View  Result Date: 04/18/2016 CLINICAL DATA:  Atelectasis EXAM: PORTABLE CHEST 1 VIEW COMPARISON:  April 16, 2016 FINDINGS: There is mild atelectasis in each mid lung. There is no edema or consolidation. Heart is upper normal in size with pulmonary vascularity within normal limits. There is atherosclerotic calcification in the aorta. No adenopathy. Feeding tube tip is below the diaphragm. IMPRESSION: Mild mid lung atelectasis bilaterally. No edema or consolidation. Aortic atherosclerosis. Electronically Signed    By: Lowella Grip III M.D.   On: 04/18/2016 08:59    Anti-infectives: Anti-infectives    Start     Dose/Rate Route Frequency Ordered Stop   04/09/16 2200  ceFAZolin (ANCEF) IVPB 1 g/50 mL premix     1 g 100 mL/hr over 30 Minutes Intravenous Every 8 hours 04/09/16 2038 04/13/16 1800   04/09/16 1914  ceFAZolin (ANCEF) 2-4 GM/100ML-% IVPB    Comments:  Trixie Deis   : cabinet override      04/09/16 1914 04/10/16 0729   04/09/16 1720  vancomycin (VANCOCIN) powder  Status:  Discontinued       As needed 04/09/16 1721 04/09/16 2013   04/06/16 0600  ceFAZolin (ANCEF) IVPB 1 g/50 mL premix  Status:  Discontinued     1 g 100 mL/hr over 30 Minutes Intravenous Every 8 hours 04/06/16 0206 04/09/16 2038   04/05/16 1729  ceFAZolin (ANCEF) 2-4 GM/100ML-% IVPB    Comments:  Rumbarger, Rachel   : cabinet override      04/05/16 1729 04/05/16 1937      Assessment/Plan: s/p Procedure(s): IRRIGATION AND DEBRIDEMENT BILATERAL LOWER EXTREMITY Hypernatremia noted a couple of days ago.  Will recheck today.  Change IVFs from NS to 1/2 NS with KCL Recheck labs now.  LOS: 14 days   Kathryne Eriksson. Dahlia Bailiff, MD, FACS [phone] Trauma Surgeon 04/19/2016
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
## 2016-04-19 NOTE — Progress Notes (Signed)
RT NTS patient obtaining copious amounts of clear/white secretions with a tad of blood due to passing the catheter through the left nare. RN aware.
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
## 2016-04-19 NOTE — Progress Notes (Deleted)
RT NTS patient obtaining copious amounts of clear/white secretions with a tad of blood due to passing the catheter through the left nare. RN aware.

## 2016-04-19 NOTE — Progress Notes (Signed)
Cortrak tube reinserted to 100 cm. Placed in patients right nare. Patient tolerated well. Xray ordered and tube added to assessment. Please keep tube if it becomes dislodged. The tube may be reinserted. Please contact the cortrak team with any questions or concerns.
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
## 2016-04-19 NOTE — Progress Notes (Signed)
Cortrak tube pulled back to 98cm. Post xray bedside nurse noted feeding tube coming out of patients mouth. Xray reordered and assessment updated.
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
## 2016-04-20 LAB — GLUCOSE, CAPILLARY
Glucose-Capillary: 129 mg/dL — ABNORMAL HIGH (ref 65–99)
Glucose-Capillary: 138 mg/dL — ABNORMAL HIGH (ref 65–99)
Glucose-Capillary: 146 mg/dL — ABNORMAL HIGH (ref 65–99)
Glucose-Capillary: 148 mg/dL — ABNORMAL HIGH (ref 65–99)
Glucose-Capillary: 148 mg/dL — ABNORMAL HIGH (ref 65–99)
Glucose-Capillary: 149 mg/dL — ABNORMAL HIGH (ref 65–99)
Glucose-Capillary: 151 mg/dL — ABNORMAL HIGH (ref 65–99)
Glucose-Capillary: 156 mg/dL — ABNORMAL HIGH (ref 65–99)

## 2016-04-20 LAB — CBC WITH DIFFERENTIAL/PLATELET
Basophils Absolute: 0 10*3/uL (ref 0.0–0.1)
Basophils Relative: 0 %
Eosinophils Absolute: 0.2 10*3/uL (ref 0.0–0.7)
Eosinophils Relative: 2 %
HCT: 33.2 % — ABNORMAL LOW (ref 39.0–52.0)
Hemoglobin: 9.9 g/dL — ABNORMAL LOW (ref 13.0–17.0)
Lymphocytes Relative: 12 %
Lymphs Abs: 1.6 10*3/uL (ref 0.7–4.0)
MCH: 28 pg (ref 26.0–34.0)
MCHC: 29.8 g/dL — ABNORMAL LOW (ref 30.0–36.0)
MCV: 94.1 fL (ref 78.0–100.0)
Monocytes Absolute: 1 10*3/uL (ref 0.1–1.0)
Monocytes Relative: 7 %
Neutro Abs: 10.6 10*3/uL — ABNORMAL HIGH (ref 1.7–7.7)
Neutrophils Relative %: 79 %
Platelets: 342 10*3/uL (ref 150–400)
RBC: 3.53 MIL/uL — ABNORMAL LOW (ref 4.22–5.81)
RDW: 14.8 % (ref 11.5–15.5)
WBC: 13.4 10*3/uL — ABNORMAL HIGH (ref 4.0–10.5)

## 2016-04-20 LAB — BASIC METABOLIC PANEL
Anion gap: 6 (ref 5–15)
BUN: 23 mg/dL — ABNORMAL HIGH (ref 6–20)
CO2: 27 mmol/L (ref 22–32)
Calcium: 8.4 mg/dL — ABNORMAL LOW (ref 8.9–10.3)
Chloride: 112 mmol/L — ABNORMAL HIGH (ref 101–111)
Creatinine, Ser: 0.52 mg/dL — ABNORMAL LOW (ref 0.61–1.24)
GFR calc Af Amer: >60 mL/min (ref >60)
GFR calc non Af Amer: >60 mL/min (ref >60)
Glucose, Bld: 167 mg/dL — ABNORMAL HIGH (ref 65–99)
Potassium: 3.8 mmol/L (ref 3.5–5.1)
Sodium: 145 mmol/L (ref 135–145)

## 2016-04-20 MED ORDER — IPRATROPIUM-ALBUTEROL 0.5-2.5 (3) MG/3ML IN SOLN
3.0000 mL | Freq: Three times a day (TID) | RESPIRATORY_TRACT | Status: DC
  Filled 2016-04-20: qty 3

## 2016-04-20 MED ORDER — IPRATROPIUM-ALBUTEROL 0.5-2.5 (3) MG/3ML IN SOLN
3.0000 mL | Freq: Three times a day (TID) | RESPIRATORY_TRACT | Status: DC
  Administered 2016-04-20 – 2016-04-27 (×22): 3 mL via RESPIRATORY_TRACT
  Filled 2016-04-20 (×21): qty 3

## 2016-04-20 NOTE — Progress Notes (Signed)
SLP Cancellation Note  Patient Details Name: [name] MRN: [id number] DOB: 22-Dec-1960   Cancelled treatment:       Reason Eval/Treat Not Completed: Fatigue/lethargy limiting ability to participate (RN reports pt not following commands at this time to cough/clear throat, also pt with appearance of secretions in posterior pharynx per RN that he can not fully clear, will continue to follow for po readiness)   Claudie Fisherman, [facility] SLP [phone]
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
## 2016-04-20 NOTE — Progress Notes (Signed)
Exam unchanged Awake Purposeful on right side Localizes on left Stable Observe

## 2016-04-20 NOTE — Progress Notes (Signed)
Orthopaedic Trauma Service (OTS)   Subjective: Intubated and currently asleep. In mits.   Objective: Temp:  [98.4 F (36.9 C)-100.3 F (37.9 C)] 100.3 F (37.9 C) (10/14 0800) Pulse Rate:  [77-101] 84 (10/14 1100) Resp:  [20-33] 27 (10/14 1100) BP: (114-155)/(60-92) 136/60 (10/14 1100) SpO2:  [87 %-97 %] 95 % (10/14 1100) Weight:  [101.7 kg (224 lb 3.3 oz)] 101.7 kg (224 lb 3.3 oz) (10/14 0500) Physical Exam RLE Dressing intact, clean, dry  Edema/ swelling controlled  Brisk cap refill, warm to touch LLE Dressing intact, clean, dry  Edema/ swelling controlled  Brisk cap refill, warm to touch  Assessment/Plan: 9 Days Post-Op Procedure(s) (LRB): IRRIGATION AND DEBRIDEMENT BILATERAL LOWER EXTREMITY (Bilateral) 1. Will follow as awareness increases; see again on Monday 2. Continue current dressing care which is excellent.  [place], MD [facility] [phone] (p)

## 2016-04-21 LAB — GLUCOSE, CAPILLARY
Glucose-Capillary: 101 mg/dL — ABNORMAL HIGH (ref 65–99)
Glucose-Capillary: 108 mg/dL — ABNORMAL HIGH (ref 65–99)
Glucose-Capillary: 113 mg/dL — ABNORMAL HIGH (ref 65–99)
Glucose-Capillary: 114 mg/dL — ABNORMAL HIGH (ref 65–99)
Glucose-Capillary: 119 mg/dL — ABNORMAL HIGH (ref 65–99)
Glucose-Capillary: 131 mg/dL — ABNORMAL HIGH (ref 65–99)

## 2016-04-21 MED ORDER — FREE WATER
200.0000 mL | Status: DC
  Administered 2016-04-22 – 2016-04-30 (×28): 200 mL

## 2016-04-21 MED ORDER — MORPHINE SULFATE (PF) 2 MG/ML IV SOLN
2.0000 mg | INTRAVENOUS | Status: DC | PRN
  Administered 2016-04-21 – 2016-04-26 (×5): 2 mg via INTRAVENOUS
  Filled 2016-04-21 (×5): qty 1

## 2016-04-21 MED ORDER — HYDROCODONE-ACETAMINOPHEN 7.5-325 MG/15ML PO SOLN
10.0000 mL | ORAL | Status: DC | PRN
  Administered 2016-04-27: 20 mL via ORAL
  Administered 2016-04-27 (×2): 15 mL via ORAL
  Administered 2016-04-27: 20 mL via ORAL
  Administered 2016-04-28: 10 mL via ORAL
  Filled 2016-04-21 (×2): qty 15
  Filled 2016-04-21: qty 30
  Filled 2016-04-21: qty 15
  Filled 2016-04-21: qty 30

## 2016-04-21 NOTE — Progress Notes (Signed)
No acute events Awake and alert, non-verbal Following commands RUE Stable
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
## 2016-04-22 ENCOUNTER — Inpatient Hospital Stay (HOSPITAL_COMMUNITY): Payer: No Typology Code available for payment source

## 2016-04-22 LAB — GLUCOSE, CAPILLARY
Glucose-Capillary: 115 mg/dL — ABNORMAL HIGH (ref 65–99)
Glucose-Capillary: 116 mg/dL — ABNORMAL HIGH (ref 65–99)
Glucose-Capillary: 173 mg/dL — ABNORMAL HIGH (ref 65–99)
Glucose-Capillary: 155 mg/dL — ABNORMAL HIGH (ref 65–99)
Glucose-Capillary: 120 mg/dL — ABNORMAL HIGH (ref 65–99)
Glucose-Capillary: 186 mg/dL — ABNORMAL HIGH (ref 65–99)

## 2016-04-22 MED ORDER — VITAL HIGH PROTEIN PO LIQD: 1000.0000 mL | ORAL | Status: DC

## 2016-04-22 MED ORDER — CHLORHEXIDINE GLUCONATE 0.12 % MT SOLN
OROMUCOSAL | Status: AC
  Filled 2016-04-22: qty 15

## 2016-04-22 NOTE — Progress Notes (Signed)
Trauma Service Note  Subjective: Patient sleepy.  Very somnolent for me this AM  Objective: Vital signs in last 24 hours: Temp:  [98.7 F (37.1 C)-100 F (37.8 C)] 98.7 F (37.1 C) (10/16 0400) Pulse Rate:  [81-96] 92 (10/16 0700) Resp:  [17-22] 18 (10/16 0700) BP: (125-153)/(70-90) 153/78 (10/16 0600) SpO2:  [90 %-100 %] 96 % (10/16 0700) Weight:  [97.1 kg (214 lb 1.1 oz)] 97.1 kg (214 lb 1.1 oz) (10/16 0500) Last BM Date: 04/21/16  Intake/Output from previous day: 10/15 0701 - 10/16 0700 In: 330 [I.V.:330] Out: 800 [Urine:800] Intake/Output this shift: No intake/output data recorded.  General: No acute distress.  Difficult to follow commands.  Lungs: Clear, but heavy upper respiratory ventilation.  Abd: soft, good bowel sounds.  Cortrak tube is out.  Extremities: No changes.  External fixator device in place  Neuro: Intact  Lab Results: CBC   Recent Labs  04/19/16 1040 04/20/16 0520  WBC 15.2* 13.4*  HGB 10.4* 9.9*  HCT 34.0* 33.2*  PLT 337 342   BMET  Recent Labs  04/19/16 1040 04/20/16 0520  NA 149* 145  K 3.2* 3.8  CL 114* 112*  CO2 27 27  GLUCOSE 127* 167*  BUN 23* 23*  CREATININE 0.52* 0.52*  CALCIUM 8.6* 8.4*   PT/INR No results for input(s): LABPROT, INR in the last 72 hours. ABG No results for input(s): PHART, HCO3 in the last 72 hours.  Invalid input(s): PCO2, PO2  Studies/Results: No results found.  Anti-infectives: Anti-infectives    Start     Dose/Rate Route Frequency Ordered Stop   04/09/16 2200  ceFAZolin (ANCEF) IVPB 1 g/50 mL premix     1 g 100 mL/hr over 30 Minutes Intravenous Every 8 hours 04/09/16 2038 04/13/16 1800   04/09/16 1914  ceFAZolin (ANCEF) 2-4 GM/100ML-% IVPB    Comments:  Trixie Deis   : cabinet override      04/09/16 1914 04/10/16 0729   04/09/16 1720  vancomycin (VANCOCIN) powder  Status:  Discontinued       As needed 04/09/16 1721 04/09/16 2013   04/06/16 0600  ceFAZolin (ANCEF) IVPB 1 g/50 mL  premix  Status:  Discontinued     1 g 100 mL/hr over 30 Minutes Intravenous Every 8 hours 04/06/16 0206 04/09/16 2038   04/05/16 1729  ceFAZolin (ANCEF) 2-4 GM/100ML-% IVPB    Comments:  Rumbarger, Rachel   : cabinet override      04/05/16 1729 04/05/16 1937      Assessment/Plan: s/p Procedure(s): IRRIGATION AND DEBRIDEMENT BILATERAL LOWER EXTREMITY Replace Cortrak  Swallowing evaluation.  May need PEG  LOS: 17 days   Kathryne Eriksson. Dahlia Bailiff, MD, FACS [phone] Trauma Surgeon 04/22/2016
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
## 2016-04-22 NOTE — Significant Event (Signed)
Cortrak feeding tube replaced using new tube. Placed via right nare-marked at 100cm at the nare. Patient tolerated procedure.
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
## 2016-04-23 LAB — GLUCOSE, CAPILLARY
Glucose-Capillary: 100 mg/dL — ABNORMAL HIGH (ref 65–99)
Glucose-Capillary: 121 mg/dL — ABNORMAL HIGH (ref 65–99)
Glucose-Capillary: 107 mg/dL — ABNORMAL HIGH (ref 65–99)
Glucose-Capillary: 112 mg/dL — ABNORMAL HIGH (ref 65–99)
Glucose-Capillary: 109 mg/dL — ABNORMAL HIGH (ref 65–99)

## 2016-04-23 MED ORDER — ALBUTEROL SULFATE (2.5 MG/3ML) 0.083% IN NEBU: 2.5000 mg | INHALATION_SOLUTION | RESPIRATORY_TRACT | Status: DC | PRN

## 2016-04-23 NOTE — Progress Notes (Signed)
Trauma Service Note  Subjective: Patient is staring straight ahead.  Not responsive at all.  Objective: Vital signs in last 24 hours: Temp:  [98.1 F (36.7 C)-98.9 F (37.2 C)] 98.1 F (36.7 C) (10/17 0400) Pulse Rate:  [75-99] 86 (10/17 0600) Resp:  [14-25] 16 (10/17 0600) BP: (126-159)/(72-93) 138/79 (10/17 0600) SpO2:  [96 %-100 %] 99 % (10/17 0600) Weight:  [97 kg (213 lb 13.5 oz)] 97 kg (213 lb 13.5 oz) (10/17 0500) Last BM Date: 04/21/16  Intake/Output from previous day: 10/16 0701 - 10/17 0700 In: 1420.6 [I.V.:30; NG/GT:1390.6] Out: 225 [Urine:225] Intake/Output this shift: No intake/output data recorded.  General: No acute distress.  Cortrak tube is out.  Not getting any nutrition.  Lungs: Clear, oxygen saturations 100% on RA  Abd: Benign.  No nutrition  Extremities: No changes.  External fixator on the keft keg,  Neuro: Eyes open, not responsive.  Did not follow commands for me.   Studies/Results: Dg Abd Portable 1v  Result Date: 04/22/2016 CLINICAL DATA:  Feeding tube placement. EXAM: PORTABLE ABDOMEN - 1 VIEW COMPARISON:  Radiograph dated 04/19/2016 FINDINGS: Feeding tube tip is in the distal duodenum at the level of the ligament of Treitz, in good position. Bowel gas pattern is normal.  No acute bone abnormality. IMPRESSION: Feeding tube tip in the distal duodenum in good position. Electronically Signed   By: Lorriane Shire M.D.   On: 04/22/2016 10:52    Anti-infectives: Anti-infectives    Start     Dose/Rate Route Frequency Ordered Stop   04/09/16 2200  ceFAZolin (ANCEF) IVPB 1 g/50 mL premix     1 g 100 mL/hr over 30 Minutes Intravenous Every 8 hours 04/09/16 2038 04/13/16 1800   04/09/16 1914  ceFAZolin (ANCEF) 2-4 GM/100ML-% IVPB    Comments:  Trixie Deis   : cabinet override      04/09/16 1914 04/10/16 0729   04/09/16 1720  vancomycin (VANCOCIN) powder  Status:  Discontinued       As needed 04/09/16 1721 04/09/16 2013   04/06/16 0600  ceFAZolin  (ANCEF) IVPB 1 g/50 mL premix  Status:  Discontinued     1 g 100 mL/hr over 30 Minutes Intravenous Every 8 hours 04/06/16 0206 04/09/16 2038   04/05/16 1729  ceFAZolin (ANCEF) 2-4 GM/100ML-% IVPB    Comments:  Rumbarger, Rachel   : cabinet override      04/05/16 1729 04/05/16 1937      Assessment/Plan: s/p Procedure(s): IRRIGATION AND DEBRIDEMENT BILATERAL LOWER EXTREMITY Cortrak or PEG?  LOS: 18 days   Kathryne Eriksson. Dahlia Bailiff, MD, FACS [phone] Trauma Surgeon 04/23/2016

## 2016-04-23 NOTE — Progress Notes (Signed)
Dr. Hulen Skains or Silvestre Gunner, please call pt's wife Wednesday morning ([place], [phone]) re: possibility of PEG tube placement.  Cortrak team unable to replace feeding tube 10/17 & will try for Wednesday morning,  Though [name] is interested in discussing PEG tube.  Thank you.

## 2016-04-24 LAB — GLUCOSE, CAPILLARY
Glucose-Capillary: 108 mg/dL — ABNORMAL HIGH (ref 65–99)
Glucose-Capillary: 119 mg/dL — ABNORMAL HIGH (ref 65–99)
Glucose-Capillary: 124 mg/dL — ABNORMAL HIGH (ref 65–99)
Glucose-Capillary: 126 mg/dL — ABNORMAL HIGH (ref 65–99)
Glucose-Capillary: 127 mg/dL — ABNORMAL HIGH (ref 65–99)
Glucose-Capillary: 133 mg/dL — ABNORMAL HIGH (ref 65–99)
Glucose-Capillary: 139 mg/dL — ABNORMAL HIGH (ref 65–99)
Glucose-Capillary: 143 mg/dL — ABNORMAL HIGH (ref 65–99)

## 2016-04-24 MED ORDER — SODIUM CHLORIDE 0.9 % IV SOLN
INTRAVENOUS | Status: DC
  Administered 2016-04-25 (×2): via INTRAVENOUS

## 2016-04-24 NOTE — Progress Notes (Signed)
F/u again with cortrak nurse re: cortrak placement. Will hang IVF if unable to place today per MD.

## 2016-04-24 NOTE — Progress Notes (Signed)
Wife, [name], interested in discussing PEG tube placement. She would like for MD to call her cell phone [phone]) when able.
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
## 2016-04-24 NOTE — Progress Notes (Signed)
Rehab admissions - Noted patient more attentive today.  Noted possible plans for PEG on Friday.  Will see how he progresses over next couple days.  Should be ready for inpatient rehab potentially Saturday or Monday.  Call me for questions.  [phone]

## 2016-04-24 NOTE — Progress Notes (Signed)
[facility]thopaedic Trauma Service (OTS)   Subjective: Patient in mits, not following commands.  Objective: Temp:  [97.6 F (36.4 C)-98.6 F (37 C)] 98 F (36.7 C) (10/18 0400) Pulse Rate:  [87-101] 98 (10/18 0600) Resp:  [16-20] 18 (10/18 0600) BP: (130-165)/(77-98) 165/91 (10/18 0600) SpO2:  [96 %-100 %] 100 % (10/18 0600) Weight:  [94.2 kg (207 lb 10.8 oz)] 94.2 kg (207 lb 10.8 oz) (10/18 0500) Physical Exam LLE Dressing with slight drainage and changed; no erythema or purulence  Edema/ swelling controlled and improving; all pin sites excellent  Sens: DPN, SPN, TN intact  Motor: EHL, FHL, and lessor toe ext and flex all intact grossly  Brisk cap refill, warm to touch RLE Dressing intact, clean, dry  Edema/ swelling controlled  Brisk cap refill, warm to touch  Assessment/Plan: 13 Days Post-Op Procedure(s) (LRB): IRRIGATION AND DEBRIDEMENT BILATERAL LOWER EXTREMITY (Bilateral) 1. Continue dressing care 2. NWB bilaterally 3. Will re-xray next week  Altamese Humeston, MD [facility] [phone] (p)

## 2016-04-25 LAB — GLUCOSE, CAPILLARY
Glucose-Capillary: 106 mg/dL — ABNORMAL HIGH (ref 65–99)
Glucose-Capillary: 107 mg/dL — ABNORMAL HIGH (ref 65–99)
Glucose-Capillary: 112 mg/dL — ABNORMAL HIGH (ref 65–99)
Glucose-Capillary: 120 mg/dL — ABNORMAL HIGH (ref 65–99)
Glucose-Capillary: 123 mg/dL — ABNORMAL HIGH (ref 65–99)
Glucose-Capillary: 123 mg/dL — ABNORMAL HIGH (ref 65–99)

## 2016-04-25 MED ORDER — CEFAZOLIN IN D5W 1 GM/50ML IV SOLN
1.0000 g | Freq: Once | INTRAVENOUS | Status: DC
  Filled 2016-04-25: qty 50
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
## 2016-04-25 NOTE — Progress Notes (Signed)
Patient removed condum catheter. Very restless, given 2 mg morphine IV for pain. Repositioned to right ride with wedge support. RN will continue to monitor.

## 2016-04-25 NOTE — Progress Notes (Signed)
Patient is still non verbal, but follows my directions when I am  performing mouth care, he continues to remove his condom catheter not sure what is motivating that but seems intentional.

## 2016-04-26 ENCOUNTER — Encounter (HOSPITAL_COMMUNITY): Admission: EM | Disposition: A | Discharge status: Rehabilitation Facility | Payer: Self-pay | Source: Home / Self Care

## 2016-04-26 ENCOUNTER — Inpatient Hospital Stay (HOSPITAL_COMMUNITY): Payer: No Typology Code available for payment source | Admitting: Certified Registered Nurse Anesthetist

## 2016-04-26 ENCOUNTER — Encounter (HOSPITAL_COMMUNITY): Payer: Self-pay

## 2016-04-26 HISTORY — PX: PEG PLACEMENT: SHX5437

## 2016-04-26 HISTORY — PX: ESOPHAGOGASTRODUODENOSCOPY: SHX5428

## 2016-04-26 LAB — GLUCOSE, CAPILLARY
Glucose-Capillary: 108 mg/dL — ABNORMAL HIGH (ref 65–99)
Glucose-Capillary: 118 mg/dL — ABNORMAL HIGH (ref 65–99)
Glucose-Capillary: 120 mg/dL — ABNORMAL HIGH (ref 65–99)
Glucose-Capillary: 137 mg/dL — ABNORMAL HIGH (ref 65–99)
Glucose-Capillary: 141 mg/dL — ABNORMAL HIGH (ref 65–99)

## 2016-04-26 LAB — CBC
HCT: 36.8 % — ABNORMAL LOW (ref 39.0–52.0)
Hemoglobin: 11.7 g/dL — ABNORMAL LOW (ref 13.0–17.0)
MCH: 28.8 pg (ref 26.0–34.0)
MCHC: 31.8 g/dL (ref 30.0–36.0)
MCV: 90.6 fL (ref 78.0–100.0)
Platelets: 363 10*3/uL (ref 150–400)
RBC: 4.06 MIL/uL — ABNORMAL LOW (ref 4.22–5.81)
RDW: 13.9 % (ref 11.5–15.5)
WBC: 10.7 10*3/uL — ABNORMAL HIGH (ref 4.0–10.5)

## 2016-04-26 LAB — BASIC METABOLIC PANEL
Anion gap: 9 (ref 5–15)
BUN: 22 mg/dL — ABNORMAL HIGH (ref 6–20)
CO2: 23 mmol/L (ref 22–32)
Calcium: 8.9 mg/dL (ref 8.9–10.3)
Chloride: 108 mmol/L (ref 101–111)
Creatinine, Ser: 0.53 mg/dL — ABNORMAL LOW (ref 0.61–1.24)
GFR calc Af Amer: >60 mL/min (ref >60)
GFR calc non Af Amer: >60 mL/min (ref >60)
Glucose, Bld: 122 mg/dL — ABNORMAL HIGH (ref 65–99)
Potassium: 3.7 mmol/L (ref 3.5–5.1)
Sodium: 140 mmol/L (ref 135–145)

## 2016-04-26 SURGERY — EGD (ESOPHAGOGASTRODUODENOSCOPY)
Anesthesia: Monitor Anesthesia Care

## 2016-04-26 MED ORDER — PROPOFOL 500 MG/50ML IV EMUL
INTRAVENOUS | Status: DC | PRN
  Administered 2016-04-26: 100 ug/kg/min via INTRAVENOUS

## 2016-04-26 MED ORDER — LACTATED RINGERS IV SOLN
INTRAVENOUS | Status: DC | PRN
  Administered 2016-04-26: 11:00:00 via INTRAVENOUS

## 2016-04-26 MED ORDER — CEFAZOLIN SODIUM-DEXTROSE 2-3 GM-% IV SOLR
INTRAVENOUS | Status: DC | PRN
  Administered 2016-04-26: 2 g via INTRAVENOUS

## 2016-04-26 MED ORDER — CEFAZOLIN SODIUM-DEXTROSE 2-4 GM/100ML-% IV SOLN
INTRAVENOUS | Status: AC
Start: 2016-04-26 — End: 2016-04-26
  Filled 2016-04-26: qty 100
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
## 2016-04-26 NOTE — Progress Notes (Signed)
Pt returned from Endo with peg tube placed dry dressing dry and intact. Abdominal binder in place. No noted distress. No pain observed.  Wife at bedside. Call bell within reach. Will continue to monitor.

## 2016-04-26 NOTE — Anesthesia Postprocedure Evaluation (Signed)
Anesthesia Post Note  Patient: [name]  Procedure(s) Performed: Procedure(s) (LRB): ESOPHAGOGASTRODUODENOSCOPY (EGD) (N/A) PERCUTANEOUS ENDOSCOPIC GASTROSTOMY (PEG) PLACEMENT (N/A)  Patient location during evaluation: Endoscopy Anesthesia Type: MAC Level of consciousness: responds to stimulation and obtunded/minimal responses Pain management: pain level controlled Vital Signs Assessment: post-procedure vital signs reviewed and stable Respiratory status: spontaneous breathing and respiratory function stable Cardiovascular status: blood pressure returned to baseline and stable Postop Assessment: no headache Anesthetic complications: no    Last Vitals:  Vitals:   04/26/16 0439 04/26/16 1030  BP: (!) 163/90 (!) 166/94  Pulse: 79 (!) 102  Resp: 20 (!) 21  Temp: 36.6 C     Last Pain:  Vitals:   04/26/16 1030  TempSrc: Axillary  PainSc:                  Verdie Drown

## 2016-04-26 NOTE — Progress Notes (Signed)
Patient wife requested to be contacted prior to patient leaving unit for procedure. Report and patients wife cell number provided for oncoming RN.

## 2016-04-26 NOTE — Anesthesia Preprocedure Evaluation (Signed)
Anesthesia Evaluation  Patient identified by MRN, date of birth, ID band Patient unresponsive    Reviewed: Allergy & Precautions, NPO status , Patient's Chart, lab work & pertinent test results, Unable to perform ROS - Chart review only  Airway   TM Distance: >3 FB Neck ROM: Limited  Mouth opening: Limited Mouth Opening  Dental  (+) Teeth Intact   Pulmonary Current Smoker,    + rhonchi  + decreased breath sounds      Cardiovascular hypertension, + Peripheral Vascular Disease   Rhythm:Regular Rate:Normal     Neuro/Psych  Neuromuscular disease    GI/Hepatic   Endo/Other    Renal/GU      Musculoskeletal   Abdominal Normal abdominal exam  (+)   Peds  Hematology  (+) anemia ,   Anesthesia Other Findings   Reproductive/Obstetrics                             Anesthesia Physical Anesthesia Plan  ASA: III  Anesthesia Plan: MAC   Post-op Pain Management:    Induction: Intravenous  Airway Management Planned: Natural Airway and Nasal Cannula  Additional Equipment:   Intra-op Plan:   Post-operative Plan:   Informed Consent: I have reviewed the patients History and Physical, chart, labs and discussed the procedure including the risks, benefits and alternatives for the proposed anesthesia with the patient or authorized representative who has indicated his/her understanding and acceptance.   History available from chart only  Plan Discussed with: Anesthesiologist and Surgeon  Anesthesia Plan Comments:         Anesthesia Quick Evaluation

## 2016-04-26 NOTE — Op Note (Signed)
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 Estimated Blood Loss:     Estimated blood loss was minimal. Procedure:                Pre-Anesthesia Assessment:                           - Prior to the procedure, a History and Physical                            was performed, and patient medications and                            allergies were reviewed. The patient is unable to                            give consent secondary to the patient's altered                            mental status. The risks and benefits of the                            procedure and the sedation options and risks were                            discussed with the patient. All questions were                            answered and informed consent was obtained. Patient                            identification and proposed procedure were verified                            by the physician, the nurse, the anesthesiologist,                            the anesthetist  and the technician in the procedure                            room in the endoscopy suite. Respiratory                            Examination: clear to auscultation. CV Examination:                            normal. ASA Grade Assessment: III - A patient with                            severe systemic disease. After reviewing the risks                            and benefits, the patient was deemed in                            satisfactory condition to undergo the procedure.                            The anesthesia plan was to use moderate sedation /                            analgesia (conscious sedation). Immediately prior                            to administration of medications, the patient was                            re-assessed for adequacy to receive sedatives. The                            heart rate, respiratory rate, oxygen saturations,                            blood pressure, adequacy of pulmonary ventilation,                            and response to care were monitored throughout the                            procedure. The physical status of the patient was                            re-assessed after the procedure.                           After obtaining informed consent, the endoscope was                            passed under direct vision. Throughout the  procedure, the patient's blood pressure, pulse, and                            oxygen saturations were monitored continuously. The                            [id number]) scope was introduced through the                            mouth, with the intention of advancing to the                            duodenum. The scope was advanced to the duodenal                            bulb before the procedure was aborted. Medications                            were given. The upper GI endoscopy was accomplished                            without difficulty. The patient tolerated the                            procedure well. Scope In: Scope Out: Findings:      The examined esophagus was normal.      The anterior wall of the stomach was normal.      No gross lesions were noted in the duodenal bulb. Estimated blood loss       was minimal. Impression:               - Normal esophagus.                           - Normal anterior wall of the stomach.                           - No  gross lesions in the duodenal bulb.                           - No specimens collected. Recommendation:           - Please follow the post-PEG recommendations. Procedure Code(s):        --- Professional ---                           [phone], 52, Esophagogastroduodenoscopy, flexible,                            transoral; diagnostic, including collection of                            specimen(s) by brushing or washing, when performed                            (separate procedure) Diagnosis Code(s):        ---  Professional ---                           [id number], Other symptoms and signs involving the                            nervous system CPT copyright 2016 [employer]. All rights reserved. The codes documented in this report are preliminary and upon coder review may  be revised to meet current compliance requirements. Georganna Skeans, MD 04/26/2016 11:43:21 AM This report has been signed electronically. Number of Addenda: 0
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
## 2016-04-26 NOTE — Transfer of Care (Signed)
Immediate Anesthesia Transfer of Care Note  Patient: [name]  Procedure(s) Performed: Procedure(s): ESOPHAGOGASTRODUODENOSCOPY (EGD) (N/A) PERCUTANEOUS ENDOSCOPIC GASTROSTOMY (PEG) PLACEMENT (N/A)  Patient Location: PACU and Endoscopy Unit  Anesthesia Type:MAC  Level of Consciousness: responds to stimulation  Airway & Oxygen Therapy: Patient Spontanous Breathing and Patient connected to nasal cannula oxygen  Post-op Assessment: Report given to RN and Post -op Vital signs reviewed and stable  Post vital signs: Reviewed and stable  Last Vitals:  Vitals:   04/26/16 0439 04/26/16 1030  BP: (!) 163/90 (!) 166/94  Pulse: 79 (!) 102  Resp: 20 (!) 21  Temp: 36.6 C     Last Pain:  Vitals:   04/26/16 1030  TempSrc: Axillary  PainSc:          Complications: No apparent anesthesia complications
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
## 2016-04-27 LAB — GLUCOSE, CAPILLARY
Glucose-Capillary: 108 mg/dL — ABNORMAL HIGH (ref 65–99)
Glucose-Capillary: 122 mg/dL — ABNORMAL HIGH (ref 65–99)
Glucose-Capillary: 136 mg/dL — ABNORMAL HIGH (ref 65–99)
Glucose-Capillary: 140 mg/dL — ABNORMAL HIGH (ref 65–99)
Glucose-Capillary: 141 mg/dL — ABNORMAL HIGH (ref 65–99)
Glucose-Capillary: 146 mg/dL — ABNORMAL HIGH (ref 65–99)

## 2016-04-27 MED ORDER — WHITE PETROLATUM GEL
Status: AC
  Administered 2016-04-27: 06:00:00
  Filled 2016-04-27: qty 1

## 2016-04-27 NOTE — Progress Notes (Signed)
Trauma Service Note  Subjective: Pateint is all over the bed.  Hard to keep him in the bed completely.  Leg was hanging over the rail and has been replaced many times  Objective: Vital signs in last 24 hours: Temp:  [97.6 F (36.4 C)-99.1 F (37.3 C)] 98.7 F (37.1 C) (10/21 1007) Pulse Rate:  [76-99] 76 (10/21 1007) Resp:  [12-20] 20 (10/21 1007) BP: (131-166)/(73-98) 131/83 (10/21 1007) SpO2:  [93 %-100 %] 100 % (10/21 1007) Weight:  [90.1 kg (198 lb 10.2 oz)] 90.1 kg (198 lb 10.2 oz) (10/21 0439) Last BM Date: 04/25/16  Intake/Output from previous day: 10/20 0701 - 10/21 0700 In: 1325 [I.V.:200; NG/GT:1125] Out: 2200 [Urine:2200] Intake/Output this shift: No intake/output data recorded.  General: No distress.  Stares at you   Lungs: Clear  Abd: Soft, tolerating tube feedings.  Has not dislodged his PEG  Extremities: No changes  Neuro: Not following commands.  Lab Results: CBC   Recent Labs  04/26/16 0500  WBC 10.7*  HGB 11.7*  HCT 36.8*  PLT 363   BMET  Recent Labs  04/26/16 0500  NA 140  K 3.7  CL 108  CO2 23  GLUCOSE 122*  BUN 22*  CREATININE 0.53*  CALCIUM 8.9   PT/INR No results for input(s): LABPROT, INR in the last 72 hours. ABG No results for input(s): PHART, HCO3 in the last 72 hours.  Invalid input(s): PCO2, PO2  Studies/Results: No results found.  Anti-infectives: Anti-infectives    Start     Dose/Rate Route Frequency Ordered Stop   04/26/16 1100  ceFAZolin (ANCEF) IVPB 1 g/50 mL premix     1 g 100 mL/hr over 30 Minutes Intravenous  Once 04/25/16 0938     04/09/16 2200  ceFAZolin (ANCEF) IVPB 1 g/50 mL premix     1 g 100 mL/hr over 30 Minutes Intravenous Every 8 hours 04/09/16 2038 04/13/16 1800   04/09/16 1914  ceFAZolin (ANCEF) 2-4 GM/100ML-% IVPB    Comments:  Abbie Sons, Sarah   : cabinet override      04/09/16 1914 04/10/16 0729   04/09/16 1720  vancomycin (VANCOCIN) powder  Status:  Discontinued       As needed 04/09/16  1721 04/09/16 2013   04/06/16 0600  ceFAZolin (ANCEF) IVPB 1 g/50 mL premix  Status:  Discontinued     1 g 100 mL/hr over 30 Minutes Intravenous Every 8 hours 04/06/16 0206 04/09/16 2038   04/05/16 1729  ceFAZolin (ANCEF) 2-4 GM/100ML-% IVPB    Comments:  Rumbarger, Rachel   : cabinet override      04/05/16 1729 04/05/16 1937      Assessment/Plan: s/p Procedure(s): ESOPHAGOGASTRODUODENOSCOPY (EGD) PERCUTANEOUS ENDOSCOPIC GASTROSTOMY (PEG) PLACEMENT No changes  LOS: 22 days   Kathryne Eriksson. Dahlia Bailiff, MD, FACS [phone] Trauma Surgeon 04/27/2016

## 2016-04-28 LAB — BASIC METABOLIC PANEL
Anion gap: 7 (ref 5–15)
BUN: 20 mg/dL (ref 6–20)
CO2: 27 mmol/L (ref 22–32)
Calcium: 9 mg/dL (ref 8.9–10.3)
Chloride: 104 mmol/L (ref 101–111)
Creatinine, Ser: 0.49 mg/dL — ABNORMAL LOW (ref 0.61–1.24)
GFR calc Af Amer: >60 mL/min (ref >60)
GFR calc non Af Amer: >60 mL/min (ref >60)
Glucose, Bld: 140 mg/dL — ABNORMAL HIGH (ref 65–99)
Potassium: 4.1 mmol/L (ref 3.5–5.1)
Sodium: 138 mmol/L (ref 135–145)

## 2016-04-28 LAB — CBC WITH DIFFERENTIAL/PLATELET
Basophils Absolute: 0 10*3/uL (ref 0.0–0.1)
Basophils Relative: 0 %
Eosinophils Absolute: 0.2 10*3/uL (ref 0.0–0.7)
Eosinophils Relative: 2 %
HCT: 38.1 % — ABNORMAL LOW (ref 39.0–52.0)
Hemoglobin: 11.9 g/dL — ABNORMAL LOW (ref 13.0–17.0)
Lymphocytes Relative: 20 %
Lymphs Abs: 2.3 10*3/uL (ref 0.7–4.0)
MCH: 28.1 pg (ref 26.0–34.0)
MCHC: 31.2 g/dL (ref 30.0–36.0)
MCV: 89.9 fL (ref 78.0–100.0)
Monocytes Absolute: 1 10*3/uL (ref 0.1–1.0)
Monocytes Relative: 8 %
Neutro Abs: 8 10*3/uL — ABNORMAL HIGH (ref 1.7–7.7)
Neutrophils Relative %: 70 %
Platelets: 349 10*3/uL (ref 150–400)
RBC: 4.24 MIL/uL (ref 4.22–5.81)
RDW: 13.9 % (ref 11.5–15.5)
WBC: 11.5 10*3/uL — ABNORMAL HIGH (ref 4.0–10.5)

## 2016-04-28 LAB — GLUCOSE, CAPILLARY
Glucose-Capillary: 130 mg/dL — ABNORMAL HIGH (ref 65–99)
Glucose-Capillary: 144 mg/dL — ABNORMAL HIGH (ref 65–99)
Glucose-Capillary: 147 mg/dL — ABNORMAL HIGH (ref 65–99)
Glucose-Capillary: 152 mg/dL — ABNORMAL HIGH (ref 65–99)
Glucose-Capillary: 99 mg/dL (ref 65–99)

## 2016-04-28 MED ORDER — HYDROCORTISONE 1 % EX CREA
TOPICAL_CREAM | Freq: Three times a day (TID) | CUTANEOUS | Status: DC
  Administered 2016-04-28 – 2016-04-30 (×7): via TOPICAL
  Filled 2016-04-28: qty 28

## 2016-04-28 MED ORDER — ALTEPLASE 2 MG IJ SOLR
2.0000 mg | Freq: Once | INTRAMUSCULAR | Status: AC
  Administered 2016-04-28: 2 mg
  Filled 2016-04-28: qty 2
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
## 2016-04-29 ENCOUNTER — Encounter (HOSPITAL_COMMUNITY): Payer: Self-pay | Admitting: General Surgery

## 2016-04-29 LAB — GLUCOSE, CAPILLARY
Glucose-Capillary: 118 mg/dL — ABNORMAL HIGH (ref 65–99)
Glucose-Capillary: 120 mg/dL — ABNORMAL HIGH (ref 65–99)
Glucose-Capillary: 140 mg/dL — ABNORMAL HIGH (ref 65–99)
Glucose-Capillary: 141 mg/dL — ABNORMAL HIGH (ref 65–99)
Glucose-Capillary: 145 mg/dL — ABNORMAL HIGH (ref 65–99)
Glucose-Capillary: 146 mg/dL — ABNORMAL HIGH (ref 65–99)
Glucose-Capillary: 147 mg/dL — ABNORMAL HIGH (ref 65–99)

## 2016-04-29 NOTE — Progress Notes (Signed)
Rehab admissions - I met with patient's wife today.  She plans for her brother-in-law to provide care for patient after rehab while she works.  She also has a sister and family who can assist.  Wife is planning for a ramp and planning to widen a couple doors to accommodate patient at wheel chair level at home as needed.  If patient is stable, will tentatively plan for acute inpatient rehab admission for tomorrow.  Call me for questions.  #[phone]
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
## 2016-04-30 ENCOUNTER — Encounter: Payer: Self-pay | Admitting: Physician Assistant

## 2016-04-30 ENCOUNTER — Inpatient Hospital Stay (HOSPITAL_COMMUNITY)
Admission: RE | Admit: 2016-04-30 | Discharge: 2016-06-03 | DRG: 940 | Disposition: A | Discharge status: Home Health | Payer: No Typology Code available for payment source | Source: Intra-hospital | Attending: Physical Medicine & Rehabilitation | Admitting: Physical Medicine & Rehabilitation

## 2016-04-30 ENCOUNTER — Inpatient Hospital Stay (HOSPITAL_COMMUNITY)
Admission: RE | Admit: 2016-04-30 | Payer: No Typology Code available for payment source | Source: Intra-hospital | Admitting: Physical Medicine & Rehabilitation

## 2016-04-30 DIAGNOSIS — S92101D Unspecified fracture of right talus, subsequent encounter for fracture with routine healing: Secondary | ICD-10-CM

## 2016-04-30 DIAGNOSIS — S12200D Unspecified displaced fracture of third cervical vertebra, subsequent encounter for fracture with routine healing: Secondary | ICD-10-CM | POA: Diagnosis not present

## 2016-04-30 DIAGNOSIS — S12101S Unspecified nondisplaced fracture of second cervical vertebra, sequela: Secondary | ICD-10-CM

## 2016-04-30 DIAGNOSIS — F068 Other specified mental disorders due to known physiological condition: Secondary | ICD-10-CM | POA: Diagnosis present

## 2016-04-30 DIAGNOSIS — I10 Essential (primary) hypertension: Secondary | ICD-10-CM | POA: Diagnosis present

## 2016-04-30 DIAGNOSIS — M25512 Pain in left shoulder: Secondary | ICD-10-CM | POA: Diagnosis not present

## 2016-04-30 DIAGNOSIS — T8484XD Pain due to internal orthopedic prosthetic devices, implants and grafts, subsequent encounter: Secondary | ICD-10-CM | POA: Diagnosis not present

## 2016-04-30 DIAGNOSIS — S92102D Unspecified fracture of left talus, subsequent encounter for fracture with routine healing: Secondary | ICD-10-CM

## 2016-04-30 DIAGNOSIS — D62 Acute posthemorrhagic anemia: Secondary | ICD-10-CM | POA: Diagnosis present

## 2016-04-30 DIAGNOSIS — S12401S Unspecified nondisplaced fracture of fifth cervical vertebra, sequela: Secondary | ICD-10-CM

## 2016-04-30 DIAGNOSIS — R1310 Dysphagia, unspecified: Secondary | ICD-10-CM

## 2016-04-30 DIAGNOSIS — S069X3A Unspecified intracranial injury with loss of consciousness of 1 hour to 5 hours 59 minutes, initial encounter: Secondary | ICD-10-CM | POA: Diagnosis present

## 2016-04-30 DIAGNOSIS — Z4589 Encounter for adjustment and management of other implanted devices: Secondary | ICD-10-CM | POA: Diagnosis present

## 2016-04-30 DIAGNOSIS — S12490D Other displaced fracture of fifth cervical vertebra, subsequent encounter for fracture with routine healing: Secondary | ICD-10-CM

## 2016-04-30 DIAGNOSIS — S82831D Other fracture of upper and lower end of right fibula, subsequent encounter for closed fracture with routine healing: Secondary | ICD-10-CM

## 2016-04-30 DIAGNOSIS — Z79899 Other long term (current) drug therapy: Secondary | ICD-10-CM | POA: Diagnosis not present

## 2016-04-30 DIAGNOSIS — E785 Hyperlipidemia, unspecified: Secondary | ICD-10-CM | POA: Diagnosis not present

## 2016-04-30 DIAGNOSIS — D72829 Elevated white blood cell count, unspecified: Secondary | ICD-10-CM | POA: Diagnosis present

## 2016-04-30 DIAGNOSIS — Z931 Gastrostomy status: Secondary | ICD-10-CM | POA: Diagnosis not present

## 2016-04-30 DIAGNOSIS — S92001D Unspecified fracture of right calcaneus, subsequent encounter for fracture with routine healing: Secondary | ICD-10-CM | POA: Diagnosis not present

## 2016-04-30 DIAGNOSIS — E119 Type 2 diabetes mellitus without complications: Secondary | ICD-10-CM | POA: Diagnosis not present

## 2016-04-30 DIAGNOSIS — S12300D Unspecified displaced fracture of fourth cervical vertebra, subsequent encounter for fracture with routine healing: Secondary | ICD-10-CM | POA: Diagnosis not present

## 2016-04-30 DIAGNOSIS — S27322S Contusion of lung, bilateral, sequela: Secondary | ICD-10-CM

## 2016-04-30 DIAGNOSIS — Z87820 Personal history of traumatic brain injury: Secondary | ICD-10-CM | POA: Diagnosis not present

## 2016-04-30 DIAGNOSIS — S12100A Unspecified displaced fracture of second cervical vertebra, initial encounter for closed fracture: Secondary | ICD-10-CM

## 2016-04-30 DIAGNOSIS — R739 Hyperglycemia, unspecified: Secondary | ICD-10-CM

## 2016-04-30 DIAGNOSIS — S066X9D Traumatic subarachnoid hemorrhage with loss of consciousness of unspecified duration, subsequent encounter: Principal | ICD-10-CM

## 2016-04-30 DIAGNOSIS — S12201S Unspecified nondisplaced fracture of third cervical vertebra, sequela: Secondary | ICD-10-CM

## 2016-04-30 DIAGNOSIS — K219 Gastro-esophageal reflux disease without esophagitis: Secondary | ICD-10-CM | POA: Diagnosis not present

## 2016-04-30 DIAGNOSIS — S92002D Unspecified fracture of left calcaneus, subsequent encounter for fracture with routine healing: Secondary | ICD-10-CM

## 2016-04-30 DIAGNOSIS — R000 Tachycardia, unspecified: Secondary | ICD-10-CM

## 2016-04-30 DIAGNOSIS — Z419 Encounter for procedure for purposes other than remedying health state, unspecified: Secondary | ICD-10-CM

## 2016-04-30 DIAGNOSIS — S8291XA Unspecified fracture of right lower leg, initial encounter for closed fracture: Secondary | ICD-10-CM

## 2016-04-30 DIAGNOSIS — R7303 Prediabetes: Secondary | ICD-10-CM | POA: Diagnosis present

## 2016-04-30 DIAGNOSIS — S82831S Other fracture of upper and lower end of right fibula, sequela: Secondary | ICD-10-CM

## 2016-04-30 DIAGNOSIS — F17210 Nicotine dependence, cigarettes, uncomplicated: Secondary | ICD-10-CM | POA: Diagnosis not present

## 2016-04-30 DIAGNOSIS — Z7982 Long term (current) use of aspirin: Secondary | ICD-10-CM | POA: Diagnosis not present

## 2016-04-30 DIAGNOSIS — S92252D Displaced fracture of navicular [scaphoid] of left foot, subsequent encounter for fracture with routine healing: Secondary | ICD-10-CM | POA: Diagnosis not present

## 2016-04-30 DIAGNOSIS — S92901S Unspecified fracture of right foot, sequela: Secondary | ICD-10-CM

## 2016-04-30 DIAGNOSIS — E559 Vitamin D deficiency, unspecified: Secondary | ICD-10-CM | POA: Diagnosis not present

## 2016-04-30 DIAGNOSIS — S82831A Other fracture of upper and lower end of right fibula, initial encounter for closed fracture: Secondary | ICD-10-CM

## 2016-04-30 DIAGNOSIS — S066X9S Traumatic subarachnoid hemorrhage with loss of consciousness of unspecified duration, sequela: Secondary | ICD-10-CM

## 2016-04-30 DIAGNOSIS — S069X3S Unspecified intracranial injury with loss of consciousness of 1 hour to 5 hours 59 minutes, sequela: Secondary | ICD-10-CM

## 2016-04-30 DIAGNOSIS — S92109S Unspecified fracture of unspecified talus, sequela: Secondary | ICD-10-CM

## 2016-04-30 DIAGNOSIS — S065X9D Traumatic subdural hemorrhage with loss of consciousness of unspecified duration, subsequent encounter: Secondary | ICD-10-CM | POA: Diagnosis not present

## 2016-04-30 DIAGNOSIS — S2243XS Multiple fractures of ribs, bilateral, sequela: Secondary | ICD-10-CM

## 2016-04-30 DIAGNOSIS — S8292XA Unspecified fracture of left lower leg, initial encounter for closed fracture: Secondary | ICD-10-CM

## 2016-04-30 DIAGNOSIS — S069X9S Unspecified intracranial injury with loss of consciousness of unspecified duration, sequela: Secondary | ICD-10-CM

## 2016-04-30 DIAGNOSIS — S92109A Unspecified fracture of unspecified talus, initial encounter for closed fracture: Secondary | ICD-10-CM

## 2016-04-30 DIAGNOSIS — T8484XA Pain due to internal orthopedic prosthetic devices, implants and grafts, initial encounter: Secondary | ICD-10-CM | POA: Diagnosis present

## 2016-04-30 DIAGNOSIS — S143XXS Injury of brachial plexus, sequela: Secondary | ICD-10-CM

## 2016-04-30 LAB — GLUCOSE, CAPILLARY
Glucose-Capillary: 163 mg/dL — ABNORMAL HIGH (ref 65–99)
Glucose-Capillary: 127 mg/dL — ABNORMAL HIGH (ref 65–99)
Glucose-Capillary: 144 mg/dL — ABNORMAL HIGH (ref 65–99)
Glucose-Capillary: 98 mg/dL (ref 65–99)
Glucose-Capillary: 146 mg/dL — ABNORMAL HIGH (ref 65–99)

## 2016-04-30 LAB — CBC
HCT: 39.9 % (ref 39.0–52.0)
Hemoglobin: 12.7 g/dL — ABNORMAL LOW (ref 13.0–17.0)
MCH: 28.6 pg (ref 26.0–34.0)
MCHC: 31.8 g/dL (ref 30.0–36.0)
MCV: 89.9 fL (ref 78.0–100.0)
Platelets: 282 10*3/uL (ref 150–400)
RBC: 4.44 MIL/uL (ref 4.22–5.81)
RDW: 14.1 % (ref 11.5–15.5)
WBC: 13.7 10*3/uL — ABNORMAL HIGH (ref 4.0–10.5)

## 2016-04-30 LAB — CREATININE, SERUM
Creatinine, Ser: 0.44 mg/dL — ABNORMAL LOW (ref 0.61–1.24)
GFR calc Af Amer: >60 mL/min (ref >60)
GFR calc non Af Amer: >60 mL/min (ref >60)

## 2016-04-30 MED ORDER — HYDROCORTISONE 1 % EX CREA
TOPICAL_CREAM | Freq: Three times a day (TID) | CUTANEOUS | Status: DC
  Administered 2016-04-30 – 2016-05-04 (×11): via TOPICAL
  Administered 2016-05-04: 1 via TOPICAL
  Administered 2016-05-04 – 2016-05-10 (×18): via TOPICAL
  Administered 2016-05-10: 1 via TOPICAL
  Administered 2016-05-11 – 2016-05-14 (×9): via TOPICAL
  Administered 2016-05-14: 1 via TOPICAL
  Administered 2016-05-14 – 2016-05-23 (×17): via TOPICAL
  Administered 2016-05-23: 1 via TOPICAL
  Administered 2016-05-24 (×2): via TOPICAL
  Administered 2016-05-24: 1 via TOPICAL
  Administered 2016-05-25: 20:00:00 via TOPICAL
  Administered 2016-05-25: 1 via TOPICAL
  Administered 2016-05-27 – 2016-05-28 (×4): via TOPICAL
  Filled 2016-04-30 (×4): qty 28

## 2016-04-30 MED ORDER — PRO-STAT SUGAR FREE PO LIQD
30.0000 mL | Freq: Two times a day (BID) | ORAL | Status: DC
  Administered 2016-04-30 – 2016-05-01 (×2): 30 mL
  Filled 2016-04-30 (×2): qty 30

## 2016-04-30 MED ORDER — POLYETHYLENE GLYCOL 3350 17 G PO PACK
17.0000 g | PACK | Freq: Every day | ORAL | Status: DC
  Administered 2016-05-01 – 2016-06-02 (×30): 17 g
  Filled 2016-04-30 (×32): qty 1

## 2016-04-30 MED ORDER — AMANTADINE HCL 100 MG PO CAPS
100.0000 mg | ORAL_CAPSULE | Freq: Two times a day (BID) | ORAL | Status: DC
  Administered 2016-04-30: 100 mg
  Filled 2016-04-30 (×2): qty 1

## 2016-04-30 MED ORDER — ENOXAPARIN SODIUM 30 MG/0.3ML ~~LOC~~ SOLN
30.0000 mg | Freq: Two times a day (BID) | SUBCUTANEOUS | Status: DC
  Administered 2016-04-30 – 2016-05-13 (×26): 30 mg via SUBCUTANEOUS
  Filled 2016-04-30 (×26): qty 0.3

## 2016-04-30 MED ORDER — FREE WATER
200.0000 mL | Status: DC
  Administered 2016-04-30 – 2016-05-01 (×5): 200 mL

## 2016-04-30 MED ORDER — ALBUTEROL SULFATE (2.5 MG/3ML) 0.083% IN NEBU: 2.5000 mg | INHALATION_SOLUTION | RESPIRATORY_TRACT | Status: DC | PRN

## 2016-04-30 MED ORDER — METOPROLOL TARTRATE 25 MG/10 ML ORAL SUSPENSION
50.0000 mg | Freq: Two times a day (BID) | ORAL | Status: DC
  Administered 2016-04-30 – 2016-05-02 (×4): 50 mg
  Administered 2016-05-02 (×2): 25 mg
  Administered 2016-05-03 – 2016-05-08 (×12): 50 mg
  Administered 2016-05-09: 25 mg
  Administered 2016-05-09 – 2016-05-14 (×11): 50 mg
  Administered 2016-05-15: 25 mg
  Administered 2016-05-15 – 2016-05-18 (×6): 50 mg
  Administered 2016-05-18: 25 mg
  Administered 2016-05-19 – 2016-06-01 (×26): 50 mg
  Filled 2016-04-30 (×70): qty 20

## 2016-04-30 MED ORDER — POLYETHYLENE GLYCOL 3350 17 G PO PACK
17.0000 g | PACK | Freq: Every day | ORAL | Status: DC
  Administered 2016-04-30: 17 g
  Filled 2016-04-30: qty 1

## 2016-04-30 MED ORDER — AMANTADINE HCL 100 MG PO CAPS
100.0000 mg | ORAL_CAPSULE | Freq: Two times a day (BID) | ORAL | Status: DC
  Administered 2016-04-30 – 2016-05-05 (×10): 100 mg
  Filled 2016-04-30 (×10): qty 1

## 2016-04-30 MED ORDER — PANTOPRAZOLE SODIUM 40 MG PO PACK
40.0000 mg | PACK | Freq: Two times a day (BID) | ORAL | Status: DC
  Administered 2016-04-30 – 2016-06-01 (×65): 40 mg
  Filled 2016-04-30 (×62): qty 20

## 2016-04-30 MED ORDER — ENALAPRIL MALEATE 5 MG PO TABS
5.0000 mg | ORAL_TABLET | Freq: Every day | ORAL | Status: DC
  Administered 2016-05-01 – 2016-06-02 (×33): 5 mg
  Filled 2016-04-30 (×37): qty 1

## 2016-04-30 MED ORDER — DOCUSATE SODIUM 50 MG/5ML PO LIQD
100.0000 mg | Freq: Two times a day (BID) | ORAL | Status: DC | PRN
  Administered 2016-05-02 – 2016-05-22 (×3): 100 mg
  Filled 2016-04-30 (×3): qty 10

## 2016-04-30 MED ORDER — SODIUM CHLORIDE 0.9% FLUSH
10.0000 mL | INTRAVENOUS | Status: DC | PRN
  Administered 2016-04-30: 30 mL

## 2016-04-30 MED ORDER — BISACODYL 10 MG RE SUPP: 10.0000 mg | Freq: Every day | RECTAL | Status: DC | PRN

## 2016-04-30 MED ORDER — HYDROCODONE-ACETAMINOPHEN 7.5-325 MG/15ML PO SOLN
10.0000 mL | ORAL | Status: DC | PRN
  Administered 2016-05-02 – 2016-05-08 (×7): 15 mL via ORAL
  Administered 2016-05-09: 20 mL via ORAL
  Administered 2016-05-10 (×2): 15 mL via ORAL
  Administered 2016-05-11 – 2016-05-14 (×7): 20 mL via ORAL
  Administered 2016-05-14 – 2016-05-17 (×8): 15 mL via ORAL
  Administered 2016-05-17: 20 mL via ORAL
  Administered 2016-05-18: 10 mL via ORAL
  Administered 2016-05-18 – 2016-05-19 (×4): 15 mL via ORAL
  Administered 2016-05-19: 10 mL via ORAL
  Administered 2016-05-19 – 2016-05-23 (×14): 15 mL via ORAL
  Administered 2016-05-23 (×2): 20 mL via ORAL
  Administered 2016-05-23: 15 mL via ORAL
  Filled 2016-04-30 (×9): qty 15
  Filled 2016-04-30: qty 30
  Filled 2016-04-30 (×4): qty 15
  Filled 2016-04-30: qty 30
  Filled 2016-04-30 (×4): qty 15
  Filled 2016-04-30: qty 30
  Filled 2016-04-30: qty 15
  Filled 2016-04-30 (×2): qty 30
  Filled 2016-04-30 (×2): qty 15
  Filled 2016-04-30: qty 30
  Filled 2016-04-30 (×5): qty 15
  Filled 2016-04-30 (×2): qty 30
  Filled 2016-04-30: qty 15
  Filled 2016-04-30: qty 30
  Filled 2016-04-30: qty 15
  Filled 2016-04-30: qty 30
  Filled 2016-04-30 (×2): qty 15
  Filled 2016-04-30 (×2): qty 30
  Filled 2016-04-30 (×2): qty 15
  Filled 2016-04-30: qty 30
  Filled 2016-04-30: qty 15
  Filled 2016-04-30 (×3): qty 30
  Filled 2016-04-30 (×3): qty 15

## 2016-04-30 MED ORDER — PIVOT 1.5 CAL PO LIQD
1000.0000 mL | ORAL | Status: DC
  Administered 2016-04-30: 1000 mL
  Filled 2016-04-30 (×5): qty 1000

## 2016-04-30 MED ORDER — INSULIN DETEMIR 100 UNIT/ML ~~LOC~~ SOLN
7.0000 [IU] | Freq: Every day | SUBCUTANEOUS | Status: DC
  Administered 2016-04-30 – 2016-05-22 (×23): 7 [IU] via SUBCUTANEOUS
  Filled 2016-04-30 (×25): qty 0.07

## 2016-04-30 MED ORDER — INSULIN ASPART 100 UNIT/ML ~~LOC~~ SOLN
0.0000 [IU] | SUBCUTANEOUS | Status: DC
  Administered 2016-04-30: 1 [IU] via SUBCUTANEOUS
  Administered 2016-05-01 (×2): 2 [IU] via SUBCUTANEOUS
  Administered 2016-05-01 – 2016-05-02 (×3): 1 [IU] via SUBCUTANEOUS

## 2016-04-30 MED ORDER — ENOXAPARIN SODIUM 30 MG/0.3ML ~~LOC~~ SOLN: 30.0000 mg | Freq: Two times a day (BID) | SUBCUTANEOUS | Status: DC

## 2016-04-30 MED ORDER — ACETAMINOPHEN 160 MG/5ML PO SOLN
650.0000 mg | Freq: Four times a day (QID) | ORAL | Status: DC | PRN
  Administered 2016-05-02 – 2016-05-24 (×7): 650 mg
  Filled 2016-04-30 (×7): qty 20.3
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
## 2016-04-30 NOTE — H&P (Signed)
Patient is discharged from room 5C16 at this time. Alert and in stable condition. Report given to receiving nurse Ed, RN at [facility] with all questions answered. Left unit via bed with all belongings at side.

## 2016-04-30 NOTE — Progress Notes (Signed)
Patient ID: [name], male   DOB: November 02, 1960, 55 y.o.   MRN: [id number]   LOS: 25 days   Subjective: NSC   Objective: Vital signs in last 24 hours: Temp:  [98 F (36.7 C)-98.8 F (37.1 C)] 98.1 F (36.7 C) (10/24 0412) Pulse Rate:  [72-86] 81 (10/24 0412) Resp:  [18-20] 20 (10/24 0412) BP: (114-143)/(70-80) 143/75 (10/24 0412) SpO2:  [98 %-100 %] 98 % (10/23 1750) Weight:  [88.3 kg (194 lb 10.7 oz)] 88.3 kg (194 lb 10.7 oz) (10/24 0101) Last BM Date: 04/25/16   Physical Exam General appearance: no distress Resp: clear to auscultation bilaterally Cardio: regular rate and rhythm GI: Soft, +BS   Assessment/Plan: [facility] TBI/SAH/IVH/SDH- Dr. Arnoldo Morale following, has improved gradually, start amantadine C2, C3, C5 FXs with EDH - collar per Dr. 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 Arnoldo Morale Left vert art injury Rib FXs L 2-7, R 2,6,11; B pulm contusions - Pulmonary toilet Bilateral talus and calcaneus FXs s/p ORIF, ex fix- per Dr. [name], ex fix to remain for 8 weeks R prox fibula FX - per Dr. Ninfa Linden Groin rash- hydrocort cream HTN- AddedACEI 10/9 ABL anemia- Hgb stable VTE - Lovenox  FEN- No issues Dispo- TBI team, CIR when bed available    Lisette Abu, PA-C Pager: [phone] General Trauma PA Pager: [phone]  04/30/2016

## 2016-04-30 NOTE — Care Management Note (Signed)
Case Management Note  Patient Details  Name: [name] MRN: [id number] Date of Birth: [birth year]  Subjective/Objective:   Pt medically stable for discharge today.                   Action/Plan: Plan dc to [facility] later today.    Expected Discharge Date:    04/30/16             Expected Discharge Plan:  [place]  In-House Referral:     Discharge planning Services  CM Consult  Post Acute Care Choice:    Choice offered to:     DME Arranged:    DME Agency:     HH Arranged:    HH Agency:     Status of Service:  Completed, signed off  If discussed at [employer], dates discussed:    Additional Comments:  Reinaldo Raddle, RN, BSN  Trauma/Neuro ICU Case Manager [phone]

## 2016-05-01 ENCOUNTER — Inpatient Hospital Stay (HOSPITAL_COMMUNITY): Payer: No Typology Code available for payment source | Admitting: Physical Therapy

## 2016-05-01 ENCOUNTER — Inpatient Hospital Stay (HOSPITAL_COMMUNITY): Payer: No Typology Code available for payment source | Admitting: Speech Pathology

## 2016-05-01 ENCOUNTER — Encounter (HOSPITAL_COMMUNITY): Payer: Self-pay | Admitting: Physical Medicine & Rehabilitation

## 2016-05-01 ENCOUNTER — Inpatient Hospital Stay (HOSPITAL_COMMUNITY): Payer: Self-pay | Admitting: Occupational Therapy

## 2016-05-01 ENCOUNTER — Inpatient Hospital Stay (HOSPITAL_COMMUNITY): Payer: Self-pay | Admitting: Physical Therapy

## 2016-05-01 ENCOUNTER — Inpatient Hospital Stay (HOSPITAL_COMMUNITY): Payer: No Typology Code available for payment source

## 2016-05-01 DIAGNOSIS — I10 Essential (primary) hypertension: Secondary | ICD-10-CM

## 2016-05-01 DIAGNOSIS — E119 Type 2 diabetes mellitus without complications: Secondary | ICD-10-CM

## 2016-05-01 DIAGNOSIS — D72820 Lymphocytosis (symptomatic): Secondary | ICD-10-CM

## 2016-05-01 DIAGNOSIS — M7989 Other specified soft tissue disorders: Secondary | ICD-10-CM

## 2016-05-01 DIAGNOSIS — S069X9S Unspecified intracranial injury with loss of consciousness of unspecified duration, sequela: Secondary | ICD-10-CM

## 2016-05-01 DIAGNOSIS — S8292XA Unspecified fracture of left lower leg, initial encounter for closed fracture: Secondary | ICD-10-CM

## 2016-05-01 DIAGNOSIS — D62 Acute posthemorrhagic anemia: Secondary | ICD-10-CM

## 2016-05-01 DIAGNOSIS — S8291XA Unspecified fracture of right lower leg, initial encounter for closed fracture: Secondary | ICD-10-CM

## 2016-05-01 LAB — URINE MICROSCOPIC-ADD ON
RBC / HPF: NONE SEEN RBC/hpf (ref 0–5)
WBC, UA: NONE SEEN WBC/hpf (ref 0–5)

## 2016-05-01 LAB — COMPREHENSIVE METABOLIC PANEL
ALT: 28 U/L (ref 17–63)
AST: 28 U/L (ref 15–41)
Albumin: 2.8 g/dL — ABNORMAL LOW (ref 3.5–5.0)
Alkaline Phosphatase: 211 U/L — ABNORMAL HIGH (ref 38–126)
Anion gap: 11 (ref 5–15)
BUN: 22 mg/dL — ABNORMAL HIGH (ref 6–20)
CO2: 27 mmol/L (ref 22–32)
Calcium: 9.3 mg/dL (ref 8.9–10.3)
Chloride: 100 mmol/L — ABNORMAL LOW (ref 101–111)
Creatinine, Ser: 0.5 mg/dL — ABNORMAL LOW (ref 0.61–1.24)
GFR calc Af Amer: >60 mL/min (ref >60)
GFR calc non Af Amer: >60 mL/min (ref >60)
Glucose, Bld: 148 mg/dL — ABNORMAL HIGH (ref 65–99)
Potassium: 4.1 mmol/L (ref 3.5–5.1)
Sodium: 138 mmol/L (ref 135–145)
Total Bilirubin: 0.8 mg/dL (ref 0.3–1.2)
Total Protein: 6.5 g/dL (ref 6.5–8.1)

## 2016-05-01 LAB — URINALYSIS, ROUTINE W REFLEX MICROSCOPIC
Bilirubin Urine: NEGATIVE
Glucose, UA: NEGATIVE mg/dL
Hgb urine dipstick: NEGATIVE
Ketones, ur: NEGATIVE mg/dL
Leukocytes, UA: NEGATIVE
Nitrite: NEGATIVE
Protein, ur: NEGATIVE mg/dL
Specific Gravity, Urine: 1.023 (ref 1.005–1.030)
pH: 7 (ref 5.0–8.0)

## 2016-05-01 LAB — GLUCOSE, CAPILLARY
Glucose-Capillary: 119 mg/dL — ABNORMAL HIGH (ref 65–99)
Glucose-Capillary: 125 mg/dL — ABNORMAL HIGH (ref 65–99)
Glucose-Capillary: 138 mg/dL — ABNORMAL HIGH (ref 65–99)
Glucose-Capillary: 152 mg/dL — ABNORMAL HIGH (ref 65–99)
Glucose-Capillary: 154 mg/dL — ABNORMAL HIGH (ref 65–99)

## 2016-05-01 LAB — CBC WITH DIFFERENTIAL/PLATELET
Basophils Absolute: 0 10*3/uL (ref 0.0–0.1)
Basophils Relative: 0 %
Eosinophils Absolute: 0.2 10*3/uL (ref 0.0–0.7)
Eosinophils Relative: 1 %
HCT: 39.5 % (ref 39.0–52.0)
Hemoglobin: 12.3 g/dL — ABNORMAL LOW (ref 13.0–17.0)
Lymphocytes Relative: 13 %
Lymphs Abs: 1.7 10*3/uL (ref 0.7–4.0)
MCH: 28.1 pg (ref 26.0–34.0)
MCHC: 31.1 g/dL (ref 30.0–36.0)
MCV: 90.2 fL (ref 78.0–100.0)
Monocytes Absolute: 1.1 10*3/uL — ABNORMAL HIGH (ref 0.1–1.0)
Monocytes Relative: 9 %
Neutro Abs: 9.9 10*3/uL — ABNORMAL HIGH (ref 1.7–7.7)
Neutrophils Relative %: 77 %
Platelets: 274 10*3/uL (ref 150–400)
RBC: 4.38 MIL/uL (ref 4.22–5.81)
RDW: 14.2 % (ref 11.5–15.5)
WBC: 12.9 10*3/uL — ABNORMAL HIGH (ref 4.0–10.5)

## 2016-05-01 MED ORDER — PRO-STAT SUGAR FREE PO LIQD
30.0000 mL | Freq: Three times a day (TID) | ORAL | Status: DC
  Administered 2016-05-01 – 2016-05-16 (×44): 30 mL
  Filled 2016-05-01 (×42): qty 30

## 2016-05-01 MED ORDER — FREE WATER
200.0000 mL | Status: DC
  Administered 2016-05-01 – 2016-05-16 (×84): 200 mL

## 2016-05-01 MED ORDER — JEVITY 1.5 CAL/FIBER PO LIQD
375.0000 mL | Freq: Four times a day (QID) | ORAL | Status: DC
  Administered 2016-05-01: 100 mL
  Administered 2016-05-02: 200 mL
  Administered 2016-05-02: 300 mL
  Filled 2016-05-01 (×8): qty 1000
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
## 2016-05-01 NOTE — Progress Notes (Signed)
Patient information reviewed and entered into eRehab system by Isidor Bromell, RN, CRRN, PPS Coordinator.  Information including medical coding and functional independence measure will be reviewed and updated through discharge.    
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
## 2016-05-01 NOTE — Progress Notes (Signed)
Orthopedic Tech Progress Note Patient Details:  [name] [birth year] HC:[id number]  Ortho Devices Type of Ortho Device: Abdominal binder Ortho Device/Splint Location: abdomen Ortho Device/Splint Interventions: Loanne Drilling, Khamryn Calderone 05/01/2016, 2:49 PM
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
## 2016-05-01 NOTE — Progress Notes (Signed)
**  Preliminary report by tech**  Bilateral lower extremity venous duplex completed. The study was technically limited due to depth of vessels, immobility, acoustic shadow, surgical dressings, and hardware.  There is no obvious evidence of deep or superficial vein thrombosis involving the right or left lower extremities. All visualized vessels appear patent and compressible. There is no evidence of a Baker's cyst bilaterally.  05/01/16 12:35 PM [name] RVT

## 2016-05-01 NOTE — Plan of Care (Signed)
Problem: RH Bed to Chair Transfers Goal: LTG Patient will perform bed/chair transfers w/assist (PT) LTG: Patient will perform bed/chair transfers with assistance, with/without cues (PT). Via A/P  Problem: RH Car Transfers Goal: LTG Patient will perform car transfers with assist (PT) LTG: Patient will perform car transfers with assistance (PT). Via A/P  Problem: RH Wheelchair Mobility Goal: LTG Patient will propel w/c in controlled environment (PT) LTG: Patient will propel wheelchair in controlled environment, # of feet with assist (PT) 100 ft  Goal: LTG Patient will propel w/c in home environment (PT) LTG: Patient will propel wheelchair in home environment, # of feet with assistance (PT). 50 ft  Problem: RH Attention Goal: LTG Patient will demonstrate focused/sustained (PT) LTG:  Patient will demonstrate focused/sustained/selective/alternating/divided attention during functional mobility in specific environment with assist for # of minutes  (PT) 1 minute

## 2016-05-01 NOTE — Progress Notes (Signed)
Physical Therapy Session Note  Patient Details  Name: [name] MRN: [id number] Date of Birth: 10-26-1960  Today's Date: 05/01/2016 PT Individual Time: 1430-1500 PT Individual Time Calculation (min): 30 min    Short Term Goals: Week 1:  PT Short Term Goal 1 (Week 1): Pt will demonstrate sustained attention x 1 minute with supervision. PT Short Term Goal 2 (Week 1): Pt will perform rolling L/R in bed with bed features & max assist +1.  PT Short Term Goal 3 (Week 1): Pt will consistently demonstrate sitting balance with mod assist. PT Short Term Goal 4 (Week 1): Pt will transfer supine<>sitting with bed features & max assist + 1.  Therapy Documentation Precautions:  Precautions Precautions: Fall, Cervical Precaution Comments: feeding tube, c collar at all times, left LE external fixator, right foot cast  Required Braces or Orthoses: Cervical Brace Cervical Brace: Hard collar, At all times Restrictions Weight Bearing Restrictions: Yes RLE Weight Bearing: Non weight bearing LLE Weight Bearing: Non weight bearing  Patient received supine in bed. Patient found to be incontinent of urine & required +2 total assist to roll L/R for total assist peri hygiene. Pt followed simple one step commands approximately 25% of the time, to assist with rolling. Pt transferred supine to and from sitting edge of bed with +2 total assist and tolerated sitting EOB x 99mnutes with fluctuating total to moderate assist of one. Focus on midline orientation attention task, upright posture and BUE support on bed.   Spouse present throughout session for observation.   Patient returned to bed at end of session with all needs met and bed alarm engaged and posey mitts donned.    See Function Navigator for Current Functional Status.   Therapy/Group: Individual Therapy  WRetta Diones10/25/2017, 3:47 PM

## 2016-05-01 NOTE — Progress Notes (Signed)
Orthopedic Tech Progress Note Patient Details:  [name] 05/25/1961 [id number]  Patient ID: [name], male   DOB: 12-Oct-1960, 55 y.o.   MRN: [id number]   [name] 05/01/2016, 2:50 PM Viewed order from RN order list
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
## 2016-05-02 ENCOUNTER — Inpatient Hospital Stay (HOSPITAL_COMMUNITY): Payer: Self-pay | Admitting: *Deleted

## 2016-05-02 ENCOUNTER — Inpatient Hospital Stay (HOSPITAL_COMMUNITY): Payer: No Typology Code available for payment source

## 2016-05-02 ENCOUNTER — Inpatient Hospital Stay (HOSPITAL_COMMUNITY): Payer: No Typology Code available for payment source | Admitting: Speech Pathology

## 2016-05-02 ENCOUNTER — Inpatient Hospital Stay (HOSPITAL_COMMUNITY): Payer: Self-pay | Admitting: Physical Therapy

## 2016-05-02 DIAGNOSIS — S069X3S Unspecified intracranial injury with loss of consciousness of 1 hour to 5 hours 59 minutes, sequela: Secondary | ICD-10-CM

## 2016-05-02 LAB — GLUCOSE, CAPILLARY
Glucose-Capillary: 117 mg/dL — ABNORMAL HIGH (ref 65–99)
Glucose-Capillary: 118 mg/dL — ABNORMAL HIGH (ref 65–99)
Glucose-Capillary: 118 mg/dL — ABNORMAL HIGH (ref 65–99)
Glucose-Capillary: 120 mg/dL — ABNORMAL HIGH (ref 65–99)
Glucose-Capillary: 123 mg/dL — ABNORMAL HIGH (ref 65–99)
Glucose-Capillary: 137 mg/dL — ABNORMAL HIGH (ref 65–99)

## 2016-05-02 LAB — URINE CULTURE: Culture: NO GROWTH

## 2016-05-02 MED ORDER — JEVITY 1.5 CAL/FIBER PO LIQD
375.0000 mL | Freq: Four times a day (QID) | ORAL | Status: DC
  Administered 2016-05-02: 375 mL
  Filled 2016-05-02 (×5): qty 1000

## 2016-05-02 MED ORDER — INSULIN ASPART 100 UNIT/ML ~~LOC~~ SOLN
0.0000 [IU] | Freq: Four times a day (QID) | SUBCUTANEOUS | Status: DC
  Administered 2016-05-02: 1 [IU] via SUBCUTANEOUS
  Administered 2016-05-03: 2 [IU] via SUBCUTANEOUS
  Administered 2016-05-03 – 2016-05-05 (×3): 1 [IU] via SUBCUTANEOUS
  Administered 2016-05-05: 3 [IU] via SUBCUTANEOUS
  Administered 2016-05-07: 2 [IU] via SUBCUTANEOUS
  Administered 2016-05-08 (×2): 1 [IU] via SUBCUTANEOUS
  Administered 2016-05-09: 2 [IU] via SUBCUTANEOUS
  Administered 2016-05-09 – 2016-05-10 (×2): 1 [IU] via SUBCUTANEOUS
  Administered 2016-05-10: 2 [IU] via SUBCUTANEOUS
  Administered 2016-05-11 – 2016-05-12 (×3): 1 [IU] via SUBCUTANEOUS
  Administered 2016-05-13: 2 [IU] via SUBCUTANEOUS
  Administered 2016-05-14 – 2016-05-20 (×11): 1 [IU] via SUBCUTANEOUS
  Administered 2016-05-22 – 2016-05-23 (×3): 2 [IU] via SUBCUTANEOUS
  Administered 2016-05-26: 1 [IU] via SUBCUTANEOUS

## 2016-05-02 MED ORDER — METHYLPHENIDATE HCL 5 MG PO TABS
5.0000 mg | ORAL_TABLET | Freq: Two times a day (BID) | ORAL | Status: DC
  Administered 2016-05-02 – 2016-06-02 (×62): 5 mg via ORAL
  Filled 2016-05-02 (×62): qty 1

## 2016-05-02 MED ORDER — JEVITY 1.5 CAL/FIBER PO LIQD
375.0000 mL | Freq: Four times a day (QID) | ORAL | Status: DC
  Filled 2016-05-02 (×5): qty 1000

## 2016-05-02 MED ORDER — JEVITY 1.5 CAL/FIBER PO LIQD
375.0000 mL | Freq: Four times a day (QID) | ORAL | Status: DC
  Administered 2016-05-03 – 2016-05-25 (×67): 375 mL
  Filled 2016-05-02 (×101): qty 1000
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
## 2016-05-02 NOTE — Progress Notes (Signed)
Physical Therapy Note  Patient Details  Name: [name] MRN: [id number] Date of Birth: 01-Nov-1960 Today's Date: 05/02/2016  1400-1530, 90 min individual tx +2 with RT Pain: no response and no grimacing  Pt nodded head slightly when asked if this PT could call him Joevanni.  Bed mobility  +2 for rolling for hygiene change.  Pt did grasp bed rails intermittently with hand over hand assist.  Maxi Move transfer bed> w/c.  Pt assisted with doffing soiled shirt and donning clean shirt in sitting.  In sitting in w/c, pt followed directions with kicking beach ball 4/10 trials  R/L, max assist 6/10 trials each. Pt caught and passed beach ball using bil hands, with max assist initially 5/10 trials, then cues 5/10 trials.   Dysem added to w/c seat under cushion, and wooden insert added to cushion for seating and safety.  W/c propulsion attempted in recliner using bil hands, but pt was uncooperative using RUE.  With hand over hand assist, he pushed with L hand over level tile in hallway.  Mitts reapplied to pt, quick release belt donned around pt in w/c and he was left resting in nurse's area. With prompts, pt waved "bye" with R hand at end of session.  Nikolette Reindl 05/02/2016, 11:01 AM

## 2016-05-02 NOTE — Progress Notes (Signed)
Physical Therapy Note  Patient Details  Name: ESSIE RUDA MRN: [id number] Date of Birth: [birth year] Today's Date: 05/02/2016    Time: 730-839 69 minutes  1:1 No signs/symptoms of pain.  Pt performed rolling in bed to don shorts with total A, pt able to bend knees with cuing and simple command. Pt unable to assist in rolling or bed mobility with UEs due to apraxia and decreased attention. Pt total A for supine to sit. Pt sat edge of bed x 5 minutes for MD visit with max/mod A. Pt total A +3 for posterior transfer into w/c. Pt continually taking LEs off of w/c leg rests, prefers LEs in dependent vs elevated position. Attempted w/c mobility with hand over hand. Pt able to propel with each UE x 2-3 pushes before stopping due to decreased attention and coordination. Pt transferred to mat in gym with total +2 assist. Pt able to sit edge of mat with mod A-close supervision x 10 minutes. Pt placed in recliner w/c to improve safety.  Attempted  Scoot transfer to w/c with pt unable to assist with UE despite hand over hand assist. Pt unable to assist with putting lotion on his legs but able to assist with combing Rt side of his hair.  Pt awake and alert but non verbal throughout session.   Jeannetta Cerutti 05/02/2016, 8:40 AM

## 2016-05-02 NOTE — Progress Notes (Signed)
Occupational Therapy Session Note  Patient Details  Name: NITHILAN FREEMON MRN: [id number] Date of Birth: [birth year]  Today's Date: 05/02/2016 OT Individual Time: 0930-1030 OT Individual Time Calculation (min): 60 min     Short Term Goals: Week 1:  OT Short Term Goal 1 (Week 1): Pt will maintain sustained attention during functional tasks for 60 seconds with mod verbal cues OT Short Term Goal 2 (Week 1): Pt will follow one step commands for functional tasks with mod demonstration/verbal cues  OT Short Term Goal 3 (Week 1): Pt will initiate/perform anterior lean in w/c to facilitate UB dressing tasks with mod A OT Short Term Goal 4 (Week 1): Pt will thread BUE into shirt sleeves with max A  Skilled Therapeutic Interventions/Progress Updates:    Pt seated in recliner w/c at nursing station upon arrival.  Focus on UB bathing/dressing tasks seated in w/c at sink.  Pt washed his face when presented with wash cloth but perseverated with task when requested to bathe other body parts.  Pt required max demonstration cues and HOH assist to complete bathing remainder of body parts.  Pt required max A to initiate/perform anterior lean in w/c to facilitate donning shirt.  Pt required tot A for UB dressing tasks.  Pt was nonverbal throughout session. Pt continued to removed BLE from leg rests and place on floor during session.  Pt returned to nursing station at end of session with QRB in place and bilateral mitts donned. Therapy Documentation Precautions:  Precautions Precautions: Fall, Cervical Precaution Comments: feeding tube, c collar at all times, left LE external fixator, right foot cast  Required Braces or Orthoses: Cervical Brace Cervical Brace: Hard collar, At all times Restrictions Weight Bearing Restrictions: Yes RLE Weight Bearing: Non weight bearing LLE Weight Bearing: Non weight bearing Pain:  Pt with no s/s of pain  See Function Navigator for Current Functional  Status.   Therapy/Group: Individual Therapy  Leroy Libman 05/02/2016, 11:45 AM

## 2016-05-03 ENCOUNTER — Inpatient Hospital Stay (HOSPITAL_COMMUNITY): Payer: No Typology Code available for payment source | Admitting: Occupational Therapy

## 2016-05-03 ENCOUNTER — Inpatient Hospital Stay (HOSPITAL_COMMUNITY): Payer: Self-pay | Admitting: Physical Therapy

## 2016-05-03 ENCOUNTER — Inpatient Hospital Stay (HOSPITAL_COMMUNITY): Payer: No Typology Code available for payment source | Admitting: Speech Pathology

## 2016-05-03 LAB — GLUCOSE, CAPILLARY
Glucose-Capillary: 104 mg/dL — ABNORMAL HIGH (ref 65–99)
Glucose-Capillary: 117 mg/dL — ABNORMAL HIGH (ref 65–99)
Glucose-Capillary: 125 mg/dL — ABNORMAL HIGH (ref 65–99)
Glucose-Capillary: 135 mg/dL — ABNORMAL HIGH (ref 65–99)
Glucose-Capillary: 151 mg/dL — ABNORMAL HIGH (ref 65–99)
Glucose-Capillary: 155 mg/dL — ABNORMAL HIGH (ref 65–99)
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
## 2016-05-03 NOTE — Progress Notes (Signed)
Physical Therapy Session Note  Patient Details  Name: [name] MRN: [id number] Date of Birth: January 06, 1961  Today's Date: 05/03/2016 PT Individual Time: 0100-1155 PT Individual Time Calculation (min): 70 min    Short Term Goals: Week 1:  PT Short Term Goal 1 (Week 1): Pt will demonstrate sustained attention x 1 minute with supervision. PT Short Term Goal 2 (Week 1): Pt will perform rolling L/R in bed with bed features & max assist +1.  PT Short Term Goal 3 (Week 1): Pt will consistently demonstrate sitting balance with mod assist. PT Short Term Goal 4 (Week 1): Pt will transfer supine<>sitting with bed features & max assist + 1.  Skilled Therapeutic Interventions/Progress Updates:    Pt resting in bed on arrival, noted to be incontinent of urine, states "little" when asked about pain and indicates head.  Rolling +2 assist for clothing management, hygiene, and donning clean brief.  Transfer to w/c with maximove and +2 assist.  Remainder of session focus on attention, visual scanning, following one step commands, and problem solving for simple sorting task and response to biographical information.  Pt returned to room at end of session, wife present so pt left in reclining w/c with QRB tightened around pelvis and wife states she will call nursing to help assist pt back to bed when he becomes restless or she has to leave.    Therapy Documentation Precautions:  Precautions Precautions: Fall, Cervical Precaution Comments: feeding tube, c collar at all times, left LE external fixator, right foot cast  Required Braces or Orthoses: Cervical Brace Cervical Brace: Hard collar, At all times Restrictions Weight Bearing Restrictions: Yes RLE Weight Bearing: Non weight bearing LLE Weight Bearing: Non weight bearing  See Function Navigator for Current Functional Status.   Therapy/Group: Individual Therapy  Earnest Conroy Penven-Crew 05/03/2016, 4:52 PM

## 2016-05-03 NOTE — Progress Notes (Addendum)
[facility] (OTS)   Subjective: Patient not verbally responsive or coherent.  Objective: Temp:  [97.6 F (36.4 C)-98.4 F (36.9 C)] 98.4 F (36.9 C) (10/27 0459) Pulse Rate:  [76-81] 81 (10/27 0842) Resp:  [18-19] 19 (10/27 0459) BP: (119-132)/(78-81) 123/80 (10/27 0842) SpO2:  [98 %-100 %] 100 % (10/27 0459) Weight:  [86.3 kg (190 lb 4.1 oz)] 86.3 kg (190 lb 4.1 oz) (10/27 0500) Physical Exam R&LLE Dressing intact, clean, dry  Wounds all look excellent  Edema/ swelling controlled  Brisk cap refill, warm to touch  Assessment/Plan: Cast applied to right foot and ankle L ankle and foot ex-fix dressing changed F/u in 3 weeks  Altamese Onward, MD [facility] [phone] (p)

## 2016-05-03 NOTE — Progress Notes (Signed)
Speech Language Pathology Daily Session Note  Patient Details  Name: FLORENCIO PEELE MRN: [id number] Date of Birth: 1960/11/08  Today's Date: 05/03/2016 SLP Individual Time: 0730-0830 SLP Individual Time Calculation (min): 60 min   Short Term Goals: Week 1: SLP Short Term Goal 1 (Week 1): Patient will verbalize at the word level in 25% of opportunities with Max A multimodal cues.  SLP Short Term Goal 2 (Week 1): Patient will vocalize in 25% of opportunities with Max A multimodal cues.  SLP Short Term Goal 3 (Week 1): Patient will demonstrate sustained attention to a functional task for 60 seconds with Max A multimodal cues.  SLP Short Term Goal 4 (Week 1): Patient will orient to time, place and situation when given a written choice from a field of 2 with Max A multimodal cues.  SLP Short Term Goal 5 (Week 1): Patient will perform basic functional tasks (face washing, brushing teeth with suction toothbrush, apply chapstick, etc) with Max A multimodal cues.  SLP Short Term Goal 6 (Week 1): Patient will consume trials of ice chips with minimal overt s/s of aspiration with Max A multimodal cues over 3 sessions to assess readiness for objective swallow study   Skilled Therapeutic Interventions: Skilled treatment session focused on cognitive and dysphagia goals. SLP facilitated session by providing Max A verbal and visual cues for patient to follow commands in 25% of opportunities. Patient consumed trials of ice chips after oral care without overt s/s of aspiration but demonstrated a suspected delayed swallow initiation. Patient with limited verbalizations despite Max A multimodal cues but initiated asking for a drink X 1 and verbalized in ~20% of opportunities with a hoarse vocal quality X 2. Patient appeared restless throughout session while upright in bed. Patient left supine in bed with all needs within reach and bilateral mitts. Continue with current plan of care.   Function:  Eating Eating    Modified Consistency Diet: No Eating Assist Level: Helper feeds patient (with trials from SLP )           Cognition Comprehension Comprehension assist level: Understands basic less than 25% of the time/ requires cueing >75% of the time  Expression   Expression assist level: Expresses basis less than 25% of the time/requires cueing >75% of the time.  Social Interaction Social Interaction assist level: Interacts appropriately 25 - 49% of time - Needs frequent redirection.  Problem Solving Problem solving assist level: Solves basic less than 25% of the time - needs direction nearly all the time or does not effectively solve problems and may need a restraint for safety  Memory Memory assist level: Recognizes or recalls less than 25% of the time/requires cueing greater than 75% of the time    Pain No/Denies Pain   Therapy/Group: Individual Therapy  Acadia Thammavong 05/03/2016, 4:03 PM

## 2016-05-03 NOTE — Progress Notes (Signed)
Physical Therapy Note  Patient Details  Name: [name] MRN: [id number] Date of Birth: [birth year] Today's Date: 05/03/2016    Time: 1045-1155 70 minutes  1:1  No c/o pain.  Pt performed rolling to place lift pad with +2 assist, pt able to grab railing but perseverates on grabbing with difficulty letting go.  Pt transferred via maxi move into w/c.  Seated ball toss and ball kick with pt able to kick ball 50% of trials, able to catch 30% of trials. Pt seems to be more frustrated with catching task due to difficulty.  Seated reaching task for balance and core strengthening with mod A for balance, mod/max cuing and increased time for grasp and release task, pt improves with repetition.  Pt fatigues easily and requires quiet environment to sustain attention.  Pt placed in bed at end of session via maxi move, total A +2 for scooting up and rolling in bed. Pt able to identify family members in pictures in room with increased time and encouragement.   Caidan Hubbert 05/03/2016, 3:01 PM

## 2016-05-03 NOTE — Progress Notes (Signed)
Orthopedic Tech Progress Note Patient Details:  [name] 1960/10/25 [id number] Assisted Dr. [name] and [name] PA-C in cast application to Rt Leg and dressing of external fixator to Lt Leg. Casting Type of Cast: Short leg cast Cast Location: Well padded Rt Leg cast Cast Material: Fiberglass Cast Intervention: Application     Marciano Sequin Aizen Duval 05/03/2016, 1:14 PM
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
## 2016-05-04 ENCOUNTER — Inpatient Hospital Stay (HOSPITAL_COMMUNITY): Payer: Self-pay | Admitting: *Deleted

## 2016-05-04 ENCOUNTER — Inpatient Hospital Stay (HOSPITAL_COMMUNITY): Payer: Self-pay | Admitting: Occupational Therapy

## 2016-05-04 ENCOUNTER — Inpatient Hospital Stay (HOSPITAL_COMMUNITY): Payer: No Typology Code available for payment source | Admitting: Speech Pathology

## 2016-05-04 ENCOUNTER — Inpatient Hospital Stay (HOSPITAL_COMMUNITY): Payer: No Typology Code available for payment source | Admitting: *Deleted

## 2016-05-04 LAB — GLUCOSE, CAPILLARY
Glucose-Capillary: 120 mg/dL — ABNORMAL HIGH (ref 65–99)
Glucose-Capillary: 119 mg/dL — ABNORMAL HIGH (ref 65–99)
Glucose-Capillary: 94 mg/dL (ref 65–99)
Glucose-Capillary: 86 mg/dL (ref 65–99)

## 2016-05-04 NOTE — Progress Notes (Signed)
Occupational Therapy Session Note  Patient Details  Name: [name] MRN: [id number] Date of Birth: 1961-05-09  Today's Date: 05/04/2016 OT Individual Time: 1530-1610 OT Individual Time Calculation (min): 40 min     Short Term Goals: Week 1:  OT Short Term Goal 1 (Week 1): Pt will maintain sustained attention during functional tasks for 60 seconds with mod verbal cues OT Short Term Goal 2 (Week 1): Pt will follow one step commands for functional tasks with mod demonstration/verbal cues  OT Short Term Goal 3 (Week 1): Pt will initiate/perform anterior lean in w/c to facilitate UB dressing tasks with mod A OT Short Term Goal 4 (Week 1): Pt will thread BUE into shirt sleeves with max A  Skilled Therapeutic Interventions/Progress Updates:   This session mainly focused on attention to task and bilateral brain activites or crossing midline.    Patient was able to follow 1 step command to apply chapstick (after this clinician took off lid).  He was not able to don the back on after instruction.      He was unable to follow other 1 step instructions and dozed in and out the session.  Patient was not able to assist with lateral rolls or bed positioning - he required total assist x2-3.  At the end of the session, He was left with bed alarm engaged and in the supportive care of his wife   Therapy Documentation Precautions:  Precautions Precautions: Fall, Cervical Precaution Comments: feeding tube, c collar at all times, left LE external fixator, right foot cast  Required Braces or Orthoses: Cervical Brace Cervical Brace: Hard collar, At all times Restrictions Weight Bearing Restrictions: Yes RLE Weight Bearing: Non weight bearing LLE Weight Bearing: Non weight bearing  Pain:  Not sure - either facial expressions nor movements indicated pain  See Function Navigator for Current Functional Status.   Therapy/Group: Individual Therapy  Herschell Dimes 05/04/2016, 5:48 PM

## 2016-05-04 NOTE — Progress Notes (Signed)
Speech Language Pathology Daily Session Note  Patient Details  Name: [name] MRN: [id number] Date of Birth: [birth year]  Today's Date: 05/04/2016 SLP Individual Time: [id number] SLP Individual Time Calculation (min): 45 min   Short Term Goals:Week 1: SLP Short Term Goal 1 (Week 1): Patient will verbalize at the word level in 25% of opportunities with Max A multimodal cues.  SLP Short Term Goal 2 (Week 1): Patient will vocalize in 25% of opportunities with Max A multimodal cues.  SLP Short Term Goal 3 (Week 1): Patient will demonstrate sustained attention to a functional task for 60 seconds with Max A multimodal cues.  SLP Short Term Goal 4 (Week 1): Patient will orient to time, place and situation when given a written choice from a field of 2 with Max A multimodal cues.  SLP Short Term Goal 5 (Week 1): Patient will perform basic functional tasks (face washing, brushing teeth with suction toothbrush, apply chapstick, etc) with Max A multimodal cues.  SLP Short Term Goal 6 (Week 1): Patient will consume trials of ice chips with minimal overt s/s of aspiration with Max A multimodal cues over 3 sessions to assess readiness for objective swallow study   Skilled Therapeutic Interventions:Pt was seen for skilled ST targeting goals for dysphagia and cognition.  Pt was able to sustain his attention to hand hygiene and oral care while seated at the sink for 30 seconds with max assist multimodal cues for redirection to task.  Therapist facilitated the session with trials if ice chips following oral care to continue working towards repeat objective study.  Pt needed max assist multimodal cues for rate and portion control due to impulsivity with intake.  No overt s/s of aspiration evident with ice chips.  Pt was able to verbalize at the word level in 25% of opportunities with max assist verbal cues for initiation of response.  Pt remains aphonic with verbalization.  Pt was oriented to situation independently  but needed total assist to orient to place; max assist to reorient to time.  Pt was left in wheelchair at nursing station with quick release belt donned.  Continue per current plan of care.       Function:  Eating Eating   Modified Consistency Diet:  (trials of ice chips with SLP ) Eating Assist Level: Help with picking up utensils;Help managing cup/glass;Supervision or verbal cues;Helper checks for pocketed food           Cognition Comprehension Comprehension assist level: Understands basic less than 25% of the time/ requires cueing >75% of the time  Expression   Expression assist level: Expresses basis less than 25% of the time/requires cueing >75% of the time.  Social Interaction Social Interaction assist level: Interacts appropriately 25 - 49% of time - Needs frequent redirection.  Problem Solving Problem solving assist level: Solves basic less than 25% of the time - needs direction nearly all the time or does not effectively solve problems and may need a restraint for safety  Memory Memory assist level: Recognizes or recalls less than 25% of the time/requires cueing greater than 75% of the time    Pain Pain Assessment Pain Assessment: Faces Faces Pain Scale: No hurt  Therapy/Group: Individual Therapy  Lylah Lantis, Selinda Orion 05/04/2016, 12:28 PM
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
## 2016-05-04 NOTE — Progress Notes (Signed)
Occupational Therapy Session Note  Patient Details  Name: [name] MRN: [id number] Date of Birth: 1960-09-23  Today's Date: 05/04/2016 OT Individual Time: [id number] OT Individual Time Calculation (min): 60 min     Short Term Goals: Week 1:  OT Short Term Goal 1 (Week 1): Pt will maintain sustained attention during functional tasks for 60 seconds with mod verbal cues OT Short Term Goal 2 (Week 1): Pt will follow one step commands for functional tasks with mod demonstration/verbal cues  OT Short Term Goal 3 (Week 1): Pt will initiate/perform anterior lean in w/c to facilitate UB dressing tasks with mod A OT Short Term Goal 4 (Week 1): Pt will thread BUE into shirt sleeves with max A  Skilled Therapeutic Interventions/Progress Updates:   Upon approach for therapy patient supine in bed with head of bed elevated 30 degrees.      Rehab tech and this clinician spent much time cleansing up patient (wet bed and soiled brief).    Otherwise, patient overall max A and with in attention and focus.    He required overal  Total assist for bathing and dressing and +3 lateral rolls to his right side.    Mechanical lift was utilized to transfer patient bed to w/c.       Patient was left sitting upright in chair with with w/c belt for positioning suport, pillow supporting lateral right neck for chair positioning and support.     He was left at the nurse's station for supervision and safety.  Therapy Documentation Precautions:  Precautions Precautions: Fall, Cervical Precaution Comments: feeding tube, c collar at all times, left LE external fixator, right foot cast  Required Braces or Orthoses: Cervical Brace Cervical Brace: Hard collar, At all times Restrictions Weight Bearing Restrictions: Yes RLE Weight Bearing: Non weight bearing LLE Weight Bearing: Non weight bearing   Pain: no facial grimaces on his face  See Function Navigator for Current Functional Status.   Therapy/Group: Individual  Therapy  Herschell Dimes 05/04/2016, 2:11 PM

## 2016-05-04 NOTE — Progress Notes (Signed)
Physical Therapy Session Note  Patient Details  Name: [name] MRN: [id number] Date of Birth: September 24, 1960  Today's Date: 05/04/2016 PT Individual Time: 1300-1400 PT Individual Time Calculation (min): 60 min    Short Term Goals: Week 1:  PT Short Term Goal 1 (Week 1): Pt will demonstrate sustained attention x 1 minute with supervision. PT Short Term Goal 2 (Week 1): Pt will perform rolling L/R in bed with bed features & max assist +1.  PT Short Term Goal 3 (Week 1): Pt will consistently demonstrate sitting balance with mod assist. PT Short Term Goal 4 (Week 1): Pt will transfer supine<>sitting with bed features & max assist + 1.  Skilled Therapeutic Interventions/Progress Updates:   Pt in bed, maxA x2 rolling side to side for hoyer pad placement. Hand over hand placement for use of bed rails to facilitate transfer. Observed pt grimacing with rolling, notified nsg who administered pain meds during session.  Pt transferred to w/c via mechanical lift.  Pt participated in ball kick and ball toss with 6/10 successful attempts. Ball toss 3/10 successful. Pt encouraged to use LUE which pt used 2/10 trials.  Pt would frequently kick bilateral legs and require frequent positioning.  Due to frequent movement of LE pt returned to bed and repositioned with all needs within reach.   Therapy Documentation Precautions:  Precautions Precautions: Fall, Cervical Precaution Comments: feeding tube, c collar at all times, left LE external fixator, right foot cast  Required Braces or Orthoses: Cervical Brace Cervical Brace: Hard collar, At all times Restrictions Weight Bearing Restrictions: Yes RLE Weight Bearing: Non weight bearing LLE Weight Bearing: Non weight bearing General:   Vital Signs:   Pain: Pain Assessment Pain Assessment: Faces Faces Pain Scale: Hurts even more Pain Type: Acute pain Pain Descriptors / Indicators: Grimacing Pain Onset: With Activity Pain Intervention(s): Medication  (See eMAR) Mobility:   Locomotion :    Trunk/Postural Assessment :    Balance:   Exercises:   Other Treatments:     See Function Navigator for Current Functional Status.   Therapy/Group: Individual Therapy  Pratham Cassatt  Melton Walls, PTA  05/04/2016, 2:13 PM

## 2016-05-05 ENCOUNTER — Inpatient Hospital Stay (HOSPITAL_COMMUNITY): Payer: No Typology Code available for payment source | Admitting: Physical Therapy

## 2016-05-05 LAB — GLUCOSE, CAPILLARY
Glucose-Capillary: 201 mg/dL — ABNORMAL HIGH (ref 65–99)
Glucose-Capillary: 109 mg/dL — ABNORMAL HIGH (ref 65–99)
Glucose-Capillary: 138 mg/dL — ABNORMAL HIGH (ref 65–99)

## 2016-05-05 MED ORDER — ORAL CARE MOUTH RINSE
15.0000 mL | Freq: Two times a day (BID) | OROMUCOSAL | Status: DC
  Administered 2016-05-05 – 2016-06-01 (×44): 15 mL via OROMUCOSAL

## 2016-05-05 MED ORDER — DOUBLE ANTIBIOTIC 500-10000 UNIT/GM EX OINT
TOPICAL_OINTMENT | Freq: Two times a day (BID) | CUTANEOUS | Status: DC
  Administered 2016-05-05 – 2016-05-22 (×35): via TOPICAL
  Administered 2016-05-23: 1 via TOPICAL
  Administered 2016-05-23: 10:00:00 via TOPICAL
  Administered 2016-05-24: 1 via TOPICAL
  Administered 2016-05-24 – 2016-05-26 (×4): via TOPICAL
  Administered 2016-05-26: 1 via TOPICAL
  Administered 2016-05-27 – 2016-06-01 (×7): via TOPICAL
  Filled 2016-05-05 (×12): qty 1
  Filled 2016-05-05: qty 14.17
  Filled 2016-05-05 (×6): qty 1

## 2016-05-05 MED ORDER — AMANTADINE HCL 100 MG PO CAPS
100.0000 mg | ORAL_CAPSULE | Freq: Every day | ORAL | Status: DC
  Administered 2016-05-06 – 2016-05-15 (×10): 100 mg
  Filled 2016-05-05 (×10): qty 1

## 2016-05-05 MED ORDER — CHLORHEXIDINE GLUCONATE 0.12 % MT SOLN
15.0000 mL | Freq: Two times a day (BID) | OROMUCOSAL | Status: DC
  Administered 2016-05-05 – 2016-06-01 (×56): 15 mL via OROMUCOSAL
  Filled 2016-05-05 (×57): qty 15
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
## 2016-05-05 NOTE — Care Management Note (Signed)
[facility] Individual Statement of Services  Patient Name:  [name]  Date:  05/03/2016  Welcome to the [facility].  Our goal is to provide you with an individualized program based on your diagnosis and situation, designed to meet your specific needs.  With this comprehensive rehabilitation program, you will be expected to participate in at least 3 hours of rehabilitation therapies Monday-Friday, with modified therapy programming on the weekends.  Your rehabilitation program will include the following services:  Physical Therapy (PT), Occupational Therapy (OT), Speech Therapy (ST), 24 hour per day rehabilitation nursing, Therapeutic Recreaction (TR), Neuropsychology, Case Management (Social Worker), Rehabilitation Medicine, Nutrition Services and Pharmacy Services  Weekly team conferences will be held on Tuesdays to discuss your progress.  Your Social Worker will talk with you frequently to get your input and to update you on team discussions.  Team conferences with you and your family in attendance may also be held.  Expected length of stay: 4 weeks  Overall anticipated outcome: minimal assist @ wheelchair  Depending on your progress and recovery, your program may change. Your Social Worker will coordinate services and will keep you informed of any changes. Your Social Worker's name and contact numbers are listed  below.  The following services may also be recommended but are not provided by the [facility] will be made to provide these services after discharge if needed.  Arrangements include referral to agencies that provide these services.  Your insurance has been verified to be:  None  Your primary doctor is:  Research officer, political party  Pertinent information will be shared with your doctor and  your insurance company.  Social Worker:  Arnold, Marvin or ([phone]   Information discussed with and copy given to patient by: Lennart Pall, 05/03/2016, 2:20 PM
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
## 2016-05-05 NOTE — Progress Notes (Signed)
Patient's PEG tube surrounding skin area is reddened with some small amount of drainage on the site. MD notified.

## 2016-05-05 NOTE — Progress Notes (Signed)
Physical Therapy Session Note  Patient Details  Name: [name] MRN: [id number] Date of Birth: [birth year]  Today's Date: 05/05/2016 PT Individual Time: 1003-1104 PT Individual Time Calculation (min): 61 min    Short Term Goals: Week 1:  PT Short Term Goal 1 (Week 1): Pt will demonstrate sustained attention x 1 minute with supervision. PT Short Term Goal 2 (Week 1): Pt will perform rolling L/R in bed with bed features & max assist +1.  PT Short Term Goal 3 (Week 1): Pt will consistently demonstrate sitting balance with mod assist. PT Short Term Goal 4 (Week 1): Pt will transfer supine<>sitting with bed features & max assist + 1.  Skilled Therapeutic Interventions/Progress Updates:    Pt received in bed; pt without pain on faces scale. Pt observed to be incontinent of urine and rolled L<>R with max/total assist to allow therapist & rehab tech to perform peri hygiene and don clean brief & shorts. Pt transferred bed<>w/c via maxi move +2 total assist. Transported pt room>gym total assist and onto mat table with maxi move. Pt able to maintain static sitting balance without BLE support and close supervision x ~10 minutes. Attempted to initiate scoots posteriorly with use of BUE in preparation for A/P transfers but pt unable to initiate even with max multimodal cuing. Pt performed task requiring him to place cone on specific circle & pt able to follow one step commands correctly 85% of the time. Pt unable to follow 2-step commands during session. Throughout session pt would perseverate on holding onto items with HOH to remove hand, as well as hitting cups on tray table. During session pt sat cup by requested green circle & stated that's "close enough". Pt also stated "I'm getting bored." and "I'm tired." during session. Educated pt on need to perform various tasks for cognitive remediation. At end of session pt returned to bed in same manner as noted above. Pt's daughter in room & pt able to recognize  her & state her name without cuing. Pt left in bed with all needs within reach, alarm set & daughter present. Mittens not donned due to presence of daughter; educated her to notify nurse of when she left so they could don mittens & she voiced understanding.   Therapy Documentation Precautions:  Precautions Precautions: Fall, Cervical Precaution Comments: feeding tube, c collar at all times, left LE external fixator, right foot cast  Required Braces or Orthoses: Cervical Brace Cervical Brace: Hard collar, At all times Restrictions Weight Bearing Restrictions: Yes RLE Weight Bearing: Non weight bearing LLE Weight Bearing: Non weight bearing   See Function Navigator for Current Functional Status.   Therapy/Group: Individual Therapy  [name] 05/05/2016, 12:49 PM

## 2016-05-06 ENCOUNTER — Inpatient Hospital Stay (HOSPITAL_COMMUNITY): Payer: No Typology Code available for payment source | Admitting: Speech Pathology

## 2016-05-06 ENCOUNTER — Inpatient Hospital Stay (HOSPITAL_COMMUNITY): Payer: No Typology Code available for payment source | Admitting: Physical Therapy

## 2016-05-06 ENCOUNTER — Inpatient Hospital Stay (HOSPITAL_COMMUNITY): Payer: No Typology Code available for payment source | Admitting: Occupational Therapy

## 2016-05-06 ENCOUNTER — Inpatient Hospital Stay (HOSPITAL_COMMUNITY): Payer: Self-pay

## 2016-05-06 LAB — GLUCOSE, CAPILLARY
Glucose-Capillary: 100 mg/dL — ABNORMAL HIGH (ref 65–99)
Glucose-Capillary: 106 mg/dL — ABNORMAL HIGH (ref 65–99)
Glucose-Capillary: 106 mg/dL — ABNORMAL HIGH (ref 65–99)
Glucose-Capillary: 150 mg/dL — ABNORMAL HIGH (ref 65–99)
Glucose-Capillary: 92 mg/dL (ref 65–99)

## 2016-05-06 NOTE — Progress Notes (Signed)
Physical Therapy Session Note  Patient Details  Name: [name] MRN: [id number] Date of Birth: 10/21/1960  Today's Date: 05/06/2016 PT Individual Time: [id number] and 1545-1557 PT Individual Time Calculation (min): 75 min and 12 min    Short Term Goals: Week 1:  PT Short Term Goal 1 (Week 1): Pt will demonstrate sustained attention x 1 minute with supervision. PT Short Term Goal 2 (Week 1): Pt will perform rolling L/R in bed with bed features & max assist +1.  PT Short Term Goal 3 (Week 1): Pt will consistently demonstrate sitting balance with mod assist. PT Short Term Goal 4 (Week 1): Pt will transfer supine<>sitting with bed features & max assist + 1.  Skilled Therapeutic Interventions/Progress Updates:    Pt received in bed with NT present noting pt incontinent of urine. Therapist provided assistance, with pt requiring +2 total assist to roll L/R for peri hygiene, total assist donning pants, and for sling placement. Pt transferred bed<>w/c with maxi move +2 total assist for safety. Session focused on cognitive remediation; attempted to have pt write name & draw shapes (circle, square) on white board and sort cards, coins & cups by color but pt unable to follow one step commands and unable to sustain attention for more than a few seconds at a time. During activity pt stated "I'm done" and therapist provided encouragement/education to continue attempting tasks. Pt slid down in chair x 2 times during session requiring +2 assist to scoot buttocks back. Therapist provided max cuing for pt to keep BLE on ELR's but pt continued to place them on floor. At end of session pt left in bed with wife present, BUE mittens donned, bed alarm set & all needs within reach.   Pt without pain, on faces scale.  Therapy Documentation Precautions:  Precautions Precautions: Fall, Cervical Precaution Comments: feeding tube, c collar at all times, left LE external fixator, right foot cast  Required Braces or  Orthoses: Cervical Brace Cervical Brace: Hard collar, At all times Restrictions Weight Bearing Restrictions: Yes RLE Weight Bearing: Non weight bearing LLE Weight Bearing: Non weight bearing   See Function Navigator for Current Functional Status.   Therapy/Group: Individual Therapy  [name] 05/06/2016, 5:45 PM

## 2016-05-06 NOTE — Progress Notes (Signed)
Speech Language Pathology Daily Session Note  Patient Details  Name: [name] MRN: [id number] Date of Birth: 1961/07/08  Today's Date: 05/06/2016 SLP Individual Time: 0830-0930 SLP Individual Time Calculation (min): 60 min   Short Term Goals: Week 1: SLP Short Term Goal 1 (Week 1): Patient will verbalize at the word level in 25% of opportunities with Max A multimodal cues.  SLP Short Term Goal 2 (Week 1): Patient will vocalize in 25% of opportunities with Max A multimodal cues.  SLP Short Term Goal 3 (Week 1): Patient will demonstrate sustained attention to a functional task for 60 seconds with Max A multimodal cues.  SLP Short Term Goal 4 (Week 1): Patient will orient to time, place and situation when given a written choice from a field of 2 with Max A multimodal cues.  SLP Short Term Goal 5 (Week 1): Patient will perform basic functional tasks (face washing, brushing teeth with suction toothbrush, apply chapstick, etc) with Max A multimodal cues.  SLP Short Term Goal 6 (Week 1): Patient will consume trials of ice chips with minimal overt s/s of aspiration with Max A multimodal cues over 3 sessions to assess readiness for objective swallow study   Skilled Therapeutic Interventions:   Skilled treatment session focused on addressing dysphagia and cognition goals. SLP facilitated session by providing set-up of wash cloth and oral care with increased wait time and Mod assist instructions cues for initiation.  Patient also required Mod assist for through completion of oral care and required Total assist for left hand to release items at end of task.  SLP also facilitated session with set-up of ice chips via teaspoon with Total assist for self-feeding due to impulsivity.  Teaspoons of ice chip resulted in intermittent multiple swallows with no overt s/s of aspiration.  Cup sips of water also required Total, hand-over-hand assist for portion control and pacing with intermittent throat clears.  When  SLPs cues were faded to verbal patient chugged ~3 oz of water with immediate throat clears and delayed weak cough.  Continue with NPO and trials with SLP.      Function:  Eating Eating   Modified Consistency Diet: No (ice chip trials with SLP) Eating Assist Level: Hand over hand assist;Helper feeds patient   Eating Set Up Assist For: Opening containers       Cognition Comprehension Comprehension assist level: Understands basic 25 - 49% of the time/ requires cueing 50 - 75% of the time  Expression   Expression assist level: Expresses basis less than 25% of the time/requires cueing >75% of the time.  Social Interaction Social Interaction assist level: Interacts appropriately 25 - 49% of time - Needs frequent redirection.  Problem Solving Problem solving assist level: Solves basic less than 25% of the time - needs direction nearly all the time or does not effectively solve problems and may need a restraint for safety  Memory Memory assist level: Recognizes or recalls less than 25% of the time/requires cueing greater than 75% of the time    Pain Pain Assessment Pain Assessment: No/denies pain  Therapy/Group: Individual Therapy  Carmelia Roller., CCC-SLP [id number]  [place] 05/06/2016, 9:13 AM

## 2016-05-06 NOTE — Progress Notes (Signed)
Occupational Therapy Session Note  Patient Details  Name: [name] MRN: [id number] Date of Birth: 1960/09/09  Today's Date: 05/06/2016 OT Individual Time: [id number] OT Individual Time Calculation (min): 56 min     Short Term Goals: Week 1:  OT Short Term Goal 1 (Week 1): Pt will maintain sustained attention during functional tasks for 60 seconds with mod verbal cues OT Short Term Goal 2 (Week 1): Pt will follow one step commands for functional tasks with mod demonstration/verbal cues  OT Short Term Goal 3 (Week 1): Pt will initiate/perform anterior lean in w/c to facilitate UB dressing tasks with mod A OT Short Term Goal 4 (Week 1): Pt will thread BUE into shirt sleeves with max A  Skilled Therapeutic Interventions/Progress Updates:    Pt resting in bed upon arrival.  Pt's brief and pad wet (from fluid spill from PEG tube) upon arrival.  Pt required tot A + 2 for all bed mobility to remove wet linens, change brief, and don shorts.  Pt nonverbal throughout session and required max multimodal cues to initiate and perform rolling to R and L to facilitate tasks.  Pt did not attempt to assist with activities.  Pt remained in bed with bilateral mitts donned and bed alarm activated.  Therapy Documentation Precautions:  Precautions Precautions: Fall, Cervical Precaution Comments: feeding tube, c collar at all times, left LE external fixator, right foot cast  Required Braces or Orthoses: Cervical Brace Cervical Brace: Hard collar, At all times Restrictions Weight Bearing Restrictions: Yes RLE Weight Bearing: Non weight bearing LLE Weight Bearing: Non weight bearing General:   Vital Signs:   Pain: Pain Assessment Pain Assessment: No/denies pain ADL: ADL ADL Comments: see functional navigator Exercises:   Other Treatments:    See Function Navigator for Current Functional Status.   Therapy/Group: Individual Therapy  Leroy Libman 05/06/2016, 10:59 AM
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
## 2016-05-06 NOTE — Progress Notes (Signed)
Occupational Therapy Session Note  Patient Details  Name: [name] MRN: [id number] Date of Birth: 1960/10/19  Today's Date: 05/06/2016 OT Individual Time: 1100-1155 OT Individual Time Calculation (min): 55 min     Short Term Goals:Week 1:  OT Short Term Goal 1 (Week 1): Pt will maintain sustained attention during functional tasks for 60 seconds with mod verbal cues OT Short Term Goal 2 (Week 1): Pt will follow one step commands for functional tasks with mod demonstration/verbal cues  OT Short Term Goal 3 (Week 1): Pt will initiate/perform anterior lean in w/c to facilitate UB dressing tasks with mod A OT Short Term Goal 4 (Week 1): Pt will thread BUE into shirt sleeves with max A  Skilled Therapeutic Interventions/Progress Updates:    Pt received in bed with a very soiled brief that has leaked onto clothing/ bedding. +2 A to roll pt and clean up carefully without getting casts on feet soiled.  Pt needed hand over hand assist to place hands on bed rails to hold self in sidelying. Pt engaged in familiar functional activities for attention. Needed extra time and max cues to continue with task to remove 2 large nuts off 2 metal bolts.  Pt's attention span only a few seconds at a time. Attempted matching playing cards. When pt fully attending to task he successfully matched 4/8 playing cards 2x.  Attempted to have pt perform AROM exercises with either isolated arms, using hands folded together, or holding dowel bar. Pt would not attend to task at all.  He does have full AROM of shoulders per observation when pt moves on his own. Pt in bed with bed alarm on and B hand mits on.    Therapy Documentation Precautions:  Precautions Precautions: Fall, Cervical Precaution Comments: feeding tube, c collar at all times, left LE external fixator, right foot cast  Required Braces or Orthoses: Cervical Brace Cervical Brace: Hard collar, At all times Restrictions Weight Bearing Restrictions: Yes RLE  Weight Bearing: Non weight bearing LLE Weight Bearing: Non weight bearing    Vital Signs: Therapy Vitals Temp: 97.9 F (36.6 C) Temp Source: Oral Pulse Rate: 83 Resp: 18 BP: 136/82 Patient Position (if appropriate): Lying Oxygen Therapy SpO2: 98 % O2 Device: Not Delivered   Pain: no expression of pain   ADL: ADL ADL Comments: see functional navigator  See Function Navigator for Current Functional Status.   Therapy/Group: Individual Therapy  [place] 05/06/2016, 8:42 AM
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
## 2016-05-07 ENCOUNTER — Inpatient Hospital Stay (HOSPITAL_COMMUNITY): Payer: No Typology Code available for payment source | Admitting: Physical Therapy

## 2016-05-07 ENCOUNTER — Inpatient Hospital Stay (HOSPITAL_COMMUNITY): Payer: No Typology Code available for payment source | Admitting: Speech Pathology

## 2016-05-07 ENCOUNTER — Inpatient Hospital Stay (HOSPITAL_COMMUNITY): Payer: Self-pay

## 2016-05-07 LAB — GLUCOSE, CAPILLARY
Glucose-Capillary: 173 mg/dL — ABNORMAL HIGH (ref 65–99)
Glucose-Capillary: 91 mg/dL (ref 65–99)
Glucose-Capillary: 109 mg/dL — ABNORMAL HIGH (ref 65–99)
Glucose-Capillary: 98 mg/dL (ref 65–99)

## 2016-05-07 NOTE — Progress Notes (Signed)
Speech Language Pathology Daily Session Note  Patient Details  Name: [name] MRN: [id number] Date of Birth: 08-29-1960  Today's Date: 05/07/2016 SLP Individual Time: [id number] SLP Individual Time Calculation (min): 58 min   Short Term Goals: Week 1: SLP Short Term Goal 1 (Week 1): Patient will verbalize at the word level in 25% of opportunities with Max A multimodal cues.  SLP Short Term Goal 2 (Week 1): Patient will vocalize in 25% of opportunities with Max A multimodal cues.  SLP Short Term Goal 3 (Week 1): Patient will demonstrate sustained attention to a functional task for 60 seconds with Max A multimodal cues.  SLP Short Term Goal 4 (Week 1): Patient will orient to time, place and situation when given a written choice from a field of 2 with Max A multimodal cues.  SLP Short Term Goal 5 (Week 1): Patient will perform basic functional tasks (face washing, brushing teeth with suction toothbrush, apply chapstick, etc) with Max A multimodal cues.  SLP Short Term Goal 6 (Week 1): Patient will consume trials of ice chips with minimal overt s/s of aspiration with Max A multimodal cues over 3 sessions to assess readiness for objective swallow study   Skilled Therapeutic Interventions:   Skilled treatment session focused on addressing speech and swallow goals. SLP facilitated session by providing set-up of oral care as well as finger occlusion while patient brushed his teeth via suctioning.  Patient was able to release toothbrush with left hand with extra time and Max assist multimodal cues, finally taking it from his left with his right and then handing it to SLP.  SLP also facilitated session with continued trials of water via cup with hand over hand assist, intermittent use of multiple swallows & wet throat clears.  Trials of nectar via cup resulted in improved portion control and pacing with Mod assist verbal and tactile cues for intermittent multiple swallows.  Trials of puree, self-fed via  spoon were WLF.  Recommend to continue trials with SLP at this time.  SLP also focused on addressing initiation of verbalization at the word level for expression of wants and needs with Max assist multimodal cues resulting in whisper in ~15% of opportunities.  Continue with current plan of care.   Function:  Eating Eating Eating activity did not occur: Safety/medical concerns Modified Consistency Diet: No             Cognition Comprehension Comprehension assist level: Understands basic 25 - 49% of the time/ requires cueing 50 - 75% of the time  Expression   Expression assist level: Expresses basis less than 25% of the time/requires cueing >75% of the time.  Social Interaction Social Interaction assist level: Interacts appropriately 25 - 49% of time - Needs frequent redirection.  Problem Solving Problem solving assist level: Solves basic less than 25% of the time - needs direction nearly all the time or does not effectively solve problems and may need a restraint for safety  Memory Memory assist level: Recognizes or recalls less than 25% of the time/requires cueing greater than 75% of the time    Pain Pain Assessment Pain Assessment: No/denies pain Faces Pain Scale: No hurt  Therapy/Group: Individual Therapy  Carmelia Roller., [place] [id number]  [place] 05/07/2016, 5:13 PM

## 2016-05-07 NOTE — Progress Notes (Signed)
Occupational Therapy Session Note  Patient Details  Name: [name] MRN: [id number] Date of Birth: 07-Mar-1961  Today's Date: 05/07/2016 OT Individual Time: [id number] OT Individual Time Calculation (min): 85 min     Short Term Goals: Week 1:  OT Short Term Goal 1 (Week 1): Pt will maintain sustained attention during functional tasks for 60 seconds with mod verbal cues OT Short Term Goal 2 (Week 1): Pt will follow one step commands for functional tasks with mod demonstration/verbal cues  OT Short Term Goal 3 (Week 1): Pt will initiate/perform anterior lean in w/c to facilitate UB dressing tasks with mod A OT Short Term Goal 4 (Week 1): Pt will thread BUE into shirt sleeves with max A  Skilled Therapeutic Interventions/Progress Updates:    Pt resting in bed upon arrival.  Pt incontinent of bowel and required tot A + 2 for hygiene and bed mobility.  Pt required tot A + 2 for supine>sit EOB.  Pt required min A for sitting balance EOB.  Pt initiated and completed bathing and UB dressing tasks while seated EOB.  Pt returned to supine with tot A + 2 for positioning of sling for use of maximove to transfer to w/c.  Pt transferred to w/c and remained in w/c for approx 15 mins before becoming restless and then returned to bed and positioned.  Pt was nonverbal throughout session but responded to verbal cues when seated EOB.  Pt did not initiate any bed mobility tasks to facilitate placement of sling and clothing management.  Focus on activity tolerance, bed mobility, sitting balance, task initiation, sequencing, and following one step commands to increase independence with BADLs.  Therapy Documentation Precautions:  Precautions Precautions: Fall, Cervical Precaution Comments: feeding tube, c collar at all times, left LE external fixator, right foot cast  Required Braces or Orthoses: Cervical Brace Cervical Brace: Hard collar, At all times Restrictions Weight Bearing Restrictions: Yes RLE Weight  Bearing: Non weight bearing LLE Weight Bearing: Non weight bearing Pain:  PT with no s/s of pain  See Function Navigator for Current Functional Status.   Therapy/Group: Individual Therapy  Leroy Libman 05/07/2016, 11:11 AM

## 2016-05-07 NOTE — Progress Notes (Signed)
Speech Language Pathology Daily Session Note  Patient Details  Name: [name] MRN: [id number] Date of Birth: 01/16/1961  Today's Date: 05/07/2016 SLP Individual Time: [id number] SLP Individual Time Calculation (min): 57 min   Short Term Goals: Week 1: SLP Short Term Goal 1 (Week 1): Patient will verbalize at the word level in 25% of opportunities with Max A multimodal cues.  SLP Short Term Goal 2 (Week 1): Patient will vocalize in 25% of opportunities with Max A multimodal cues.  SLP Short Term Goal 3 (Week 1): Patient will demonstrate sustained attention to a functional task for 60 seconds with Max A multimodal cues.  SLP Short Term Goal 4 (Week 1): Patient will orient to time, place and situation when given a written choice from a field of 2 with Max A multimodal cues.  SLP Short Term Goal 5 (Week 1): Patient will perform basic functional tasks (face washing, brushing teeth with suction toothbrush, apply chapstick, etc) with Max A multimodal cues.  SLP Short Term Goal 6 (Week 1): Patient will consume trials of ice chips with minimal overt s/s of aspiration with Max A multimodal cues over 3 sessions to assess readiness for objective swallow study   Skilled Therapeutic Interventions:Skilled therapy intervention focused on cognitive and dysphagia goals. Patient required Max A verbal and tactile cues to complete oral care. Patient given Mod A verbal cues for redirection when presented with a washcloth for his face. Given trials of nectar-thick liquid via cup, patient required Max A multimodal cues for rate and portion control, showing no overt s/s of aspiration. Patient was oriented to month, year, place, and situation given supervision question cues. When given a field of 2, patient was able to relate basic biographical information. Patient still aphonic, expressing himself at the word level using a whisper in 10% of given opportunities. Patient left upright in bed with safety mitts and bed  alarm on and call bell within reach. Continue current plan of care.    Function:  Eating Eating     Eating Assist Level: Supervision or verbal cues;Help managing cup/glass (Therapist portioned out in [place]. increments)           Cognition Comprehension Comprehension assist level: Understands basic 25 - 49% of the time/ requires cueing 50 - 75% of the time  Expression   Expression assist level: Expresses basis less than 25% of the time/requires cueing >75% of the time.  Social Interaction Social Interaction assist level: Interacts appropriately less than 25% of the time. May be withdrawn or combative.  Problem Solving Problem solving assist level: Solves basic less than 25% of the time - needs direction nearly all the time or does not effectively solve problems and may need a restraint for safety  Memory Memory assist level: Recognizes or recalls 25 - 49% of the time/requires cueing 50 - 75% of the time    Pain Pain Assessment Pain Assessment: No/denies pain  Therapy/Group: Individual Therapy  Thornton Papas 05/07/2016, 12:17 PM
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
## 2016-05-07 NOTE — Progress Notes (Signed)
Physical Therapy Session Note  Patient Details  Name: [name] MRN: [id number] Date of Birth: [birth year]  Today's Date: 05/07/2016 PT Individual Time: 1604-1700 PT Individual Time Calculation (min): 56 min    Short Term Goals: Week 1:  PT Short Term Goal 1 (Week 1): Pt will demonstrate sustained attention x 1 minute with supervision. PT Short Term Goal 2 (Week 1): Pt will perform rolling L/R in bed with bed features & max assist +1.  PT Short Term Goal 3 (Week 1): Pt will consistently demonstrate sitting balance with mod assist. PT Short Term Goal 4 (Week 1): Pt will transfer supine<>sitting with bed features & max assist + 1.  Skilled Therapeutic Interventions/Progress Updates:    Pt received in bed; faces scale showed no pain. NT present in room & observed pt to be incontinent of bowel; pt required +2 total assist for rolling L/R with max cuing and hand over hand to grasp bed rails to allow total assist peri hygiene, donning clean brief & shorts, and placing maxi move sling. Provided pt with tilt-in-space w/c for increased comfort & safety in w/c. Transferred pt bed>w/c via maxi move +2 total assist. Adjusted pt's headrest for optimal positioning; pt noted to have tight right lateral trunk and neck flexors and unable to maintain neutral alignment with manually facilitated by therapist. In [place] gym attempted to have pt utilize dynavision in quiet, controlled environment with lights on & off. Pt required max cuing & hand over hand to attempt task; pt becoming frustrated at times stating "Get me out of here, this pisses me off" and when encouraged to continue pushing lights pt stated "You push it. I'm tired of pushing it.". Educated & encouraged pt to continue participating in task. At end of session pt transferred w/c>bed via same method as noted prior. At end of session pt left in bed with alarm set, wife present & mittens donned.   During session pt able to recall his wife's name without  assistance. Pt's wife present during session & reported observing tremors in pt's LUE & feeling them in his head yesterday evening & RN made aware.   Pt able to follow one step commands 70% of the time when asked to place RLE back on leg rest but pt unable to maintain position. Pt's sustained attention continues to remain a few seconds at most.   Therapy Documentation Precautions:  Precautions Precautions: Fall, Cervical Precaution Comments: feeding tube, c collar at all times, left LE external fixator, right foot cast  Required Braces or Orthoses: Cervical Brace Cervical Brace: Hard collar, At all times Restrictions Weight Bearing Restrictions: Yes RLE Weight Bearing: Non weight bearing LLE Weight Bearing: Non weight bearing  Pain: Pain Assessment Pain Assessment: No/denies pain Faces Pain Scale: No hurt   See Function Navigator for Current Functional Status.   Therapy/Group: Individual Therapy  [name] 05/07/2016, 5:14 PM

## 2016-05-07 NOTE — Progress Notes (Addendum)
NP Danella Sensing notified that upon entering pt's room and attempt to administer pain medication, pt's Cortrak Tube to LUQ abdomen laying in bed. Pt had removed. Pt in no acute distress, gauze dressing applied to site. Order obtained to monitor BS q 4hr. Will continue to monitor. Agricultural consultant notified

## 2016-05-08 ENCOUNTER — Inpatient Hospital Stay (HOSPITAL_COMMUNITY): Payer: Self-pay | Admitting: *Deleted

## 2016-05-08 ENCOUNTER — Inpatient Hospital Stay (HOSPITAL_COMMUNITY): Payer: No Typology Code available for payment source | Admitting: Physical Therapy

## 2016-05-08 ENCOUNTER — Inpatient Hospital Stay (HOSPITAL_COMMUNITY): Payer: Self-pay

## 2016-05-08 ENCOUNTER — Inpatient Hospital Stay (HOSPITAL_COMMUNITY): Payer: No Typology Code available for payment source | Admitting: Speech Pathology

## 2016-05-08 DIAGNOSIS — T85598A Other mechanical complication of other gastrointestinal prosthetic devices, implants and grafts, initial encounter: Secondary | ICD-10-CM

## 2016-05-08 DIAGNOSIS — R1312 Dysphagia, oropharyngeal phase: Secondary | ICD-10-CM

## 2016-05-08 LAB — CBC
HCT: 44.8 % (ref 39.0–52.0)
Hemoglobin: 14.2 g/dL (ref 13.0–17.0)
MCH: 28.8 pg (ref 26.0–34.0)
MCHC: 31.7 g/dL (ref 30.0–36.0)
MCV: 90.9 fL (ref 78.0–100.0)
Platelets: ADEQUATE 10*3/uL (ref 150–400)
RBC: 4.93 MIL/uL (ref 4.22–5.81)
RDW: 14.4 % (ref 11.5–15.5)
WBC: 11.9 10*3/uL — ABNORMAL HIGH (ref 4.0–10.5)

## 2016-05-08 LAB — BASIC METABOLIC PANEL
Anion gap: 10 (ref 5–15)
BUN: 21 mg/dL — ABNORMAL HIGH (ref 6–20)
CO2: 26 mmol/L (ref 22–32)
Calcium: 9.9 mg/dL (ref 8.9–10.3)
Chloride: 106 mmol/L (ref 101–111)
Creatinine, Ser: 0.6 mg/dL — ABNORMAL LOW (ref 0.61–1.24)
GFR calc Af Amer: >60 mL/min (ref >60)
GFR calc non Af Amer: >60 mL/min (ref >60)
Glucose, Bld: 129 mg/dL — ABNORMAL HIGH (ref 65–99)
Potassium: 4.3 mmol/L (ref 3.5–5.1)
Sodium: 142 mmol/L (ref 135–145)

## 2016-05-08 LAB — GLUCOSE, CAPILLARY
Glucose-Capillary: 119 mg/dL — ABNORMAL HIGH (ref 65–99)
Glucose-Capillary: 127 mg/dL — ABNORMAL HIGH (ref 65–99)
Glucose-Capillary: 136 mg/dL — ABNORMAL HIGH (ref 65–99)
Glucose-Capillary: 142 mg/dL — ABNORMAL HIGH (ref 65–99)
Glucose-Capillary: 136 mg/dL — ABNORMAL HIGH (ref 65–99)

## 2016-05-08 NOTE — Progress Notes (Signed)
Occupational Therapy Session Note  Patient Details  Name: [name] MRN: [id number] Date of Birth: Oct 07, 1960  Today's Date: 05/08/2016 OT Individual Time: [id number] OT Individual Time Calculation (min): 30 min  and Today's Date: 05/08/2016 OT Missed Time: 30 Minutes Missed Time Reason: Nursing care;Other (comment) (MD procedure)     Short Term Goals: Week 1:  OT Short Term Goal 1 (Week 1): Pt will maintain sustained attention during functional tasks for 60 seconds with mod verbal cues OT Short Term Goal 2 (Week 1): Pt will follow one step commands for functional tasks with mod demonstration/verbal cues  OT Short Term Goal 3 (Week 1): Pt will initiate/perform anterior lean in w/c to facilitate UB dressing tasks with mod A OT Short Term Goal 4 (Week 1): Pt will thread BUE into shirt sleeves with max A  Skilled Therapeutic Interventions/Progress Updates:    Pt resting in bed upon arrival.  Pt had pulled his PEG tube out the previous night and was awaiting arrival of Trauma MD for replacement.  Pt engaged in bed mobility tasks requiring max verbal cues and tot A + 2 for rolling to R and L.  Pt washed his face when presented with a wash cloth and made hand motions that he wanted something to drink.  Pt was encouraged to use his voice to make his requests known but pt continued to use hand motions.  Pt reeducated that he couldn't have anything to drink at this time.  MD arrived and replace feeding tube. RN notified to finish procedure.  Pt missed 30 mins skilled OT services.  Therapy Documentation Precautions:  Precautions Precautions: Fall, Cervical Precaution Comments: feeding tube, c collar at all times, left LE external fixator, right foot cast  Required Braces or Orthoses: Cervical Brace Cervical Brace: Hard collar, At all times Restrictions Weight Bearing Restrictions: Yes RLE Weight Bearing: Non weight bearing LLE Weight Bearing: Non weight bearing General: General OT Amount of  Missed Time: 30 Minutes Pain:  Pt with no s/s of pain See Function Navigator for Current Functional Status.   Therapy/Group: Individual Therapy  Leroy Libman 05/08/2016, 8:47 AM
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
## 2016-05-08 NOTE — Progress Notes (Signed)
Physical Therapy Weekly Progress Note  Patient Details  Name: [name] MRN: [id number] Date of Birth: 01/07/1961  Beginning of progress report period: May 01, 2016 End of progress report period: May 08, 2016  Today's Date: 05/08/2016 PT Individual Time: 1103-1204 and 8250-5397 PT Individual Time Calculation (min): 61 min and 60 min    Patient has met 1 of 5 short term goals.  Pt has only met 1 STG as pt is able to maintain sitting EOB with min assist and max cuing for safety. Pt continues to be limited by overall decreased cognition, poor safety awareness, decreased problem solving, and inability to consistently follow one step commands. Pt is also limited by sustained attention that only lasts for a maximum of a few seconds at a time with max cuing and inconsistent ability to follow one step commands in a controlled environment. Pt does not initiate simple functional tasks, such as rolling in bed, and requires +2 total assist for rolling and supine<>sit, as well as a mechanical lift for bed<>w/c transfers. Pt also with very minimal verbal communication during sessions.   Patient continues to demonstrate the following deficits: poor safety awareness, poor initiation of functional tasks, poor problem solving, impaired sustained attention, decreased ability to perform bed mobility & transfers, impaired sitting balance and therefore will continue to benefit from skilled PT intervention to enhance overall performance with activity tolerance, balance, postural control, ability to compensate for deficits, functional use of  right upper extremity and right lower extremity, attention, awareness, coordination, knowledge of precautions and pt/family education.  Patient is making slow, inconsistent progress towards long term goals..  Continue plan of care.  PT Short Term Goals Week 1:  PT Short Term Goal 1 (Week 1): Pt will demonstrate sustained attention x 1 minute with supervision. PT Short  Term Goal 1 - Progress (Week 1): Not met PT Short Term Goal 2 (Week 1): Pt will perform rolling L/R in bed with bed features & max assist +1.  PT Short Term Goal 2 - Progress (Week 1): Not met PT Short Term Goal 3 (Week 1): Pt will consistently demonstrate sitting balance with mod assist. PT Short Term Goal 3 - Progress (Week 1): Met PT Short Term Goal 4 (Week 1): Pt will transfer supine<>sitting with bed features & max assist + 1. PT Short Term Goal 4 - Progress (Week 1): Not met Week 2:  PT Short Term Goal 1 (Week 2): Pt will demonstrate sustained attention x 30 seconds with max cuing. PT Short Term Goal 2 (Week 2): Pt will initate rolling in bed with max cuing. PT Short Term Goal 3 (Week 2): Pt will roll L/R in bed with bed rails and max assist +1. PT Short Term Goal 4 (Week 2): Pt will initiate supine<>sit transfer.   Skilled Therapeutic Interventions/Progress Updates:    Treatment 1: Pt received in bed denying c/o pain. Throughout session pt encouraged to use his voice & speak loudly to communicate with therapist. Pt required total assist to don shorts but was able to follow commands to lift RLE to assist with threading & assisted with pulling pants up. Pt required max multimodal cuing & assistance with hand placement on bed rails & total assist to roll L/R. Pt transferred supine>sitting EOB with pt somewhat initiating movement but requiring +2 assist overall. Pt able to don shirt with assistance to pull it over his head and cuing to complete pulling shirt over; pt tolerated sitting on EOB ~5 minutes with close supervision.  Utilized maxi move to transfer pt bed>TIS w/c via total assist. Throughout session pt required max cuing to place RLE on leg rests as pt easily becomes restless. Pt able to follow one step commands 80% of the time during entire session, but with sustained attention of 5-10 seconds max. Pt continued to ask for water during session with therapist repeatedly educating him that he  is not allowed to have water at this time. Pt able to recall location [facility]), hometown, pet names, occupation, and current year without cuing but demonstrates poor safety awareness; when asked what is wrong with his BLE pt responded "nothing" even when therapist oriented pt to casts. Utilized cards with various items on them & pt able to correctly select or name objects 90% of the time when asked. Pt continues to be restless, stating "I've got to get out of here". At end of session pt left sitting in TIS w/c with QRB & BUE mittens donned.  Treatment 2: Pt received in bed & agreeable to PT. Faces scale = no pain. Pt required +2 total assist to roll L/R for sling placement as pt did not initiate movements and required max cuing/hand over hand to place hand on bed rails. Pt transferred bed>w/c with maxi sky mechanical lift & +2 assist for safety. Pt requires max cuing for hand placement and safety with transfer as he will attempt to grab sling & lift. Once in w/c session focused on cognitive remediation. Pt with poor sustained attention, lasting only ~5 seconds at a time. Attempted to have pt sort plastic pieces by color (2 colors only) and sort cards by color but pt required max cuing to attend to task & only followed one step instructions 20% of the time. Pt with very minimal verbal communication during session & did so only with max encouragement. Pt continues to perseverate on wanting water to drink even with max education on inability to provide pt with some per SLP's recommendation. Pt restless during session, moving RLE off of leg rest and tapping armrest as well as frustrated with tasks as pt would throw cards onto table. On one occasion pt stuck plastic piece in mouth requiring max cuing to remove it. At end of session pt left sitting in TIS w/c at nurses station with [place] donned & QRB in place. Adjusted pt's headrest to provide more support to head/neck.  Therapy  Documentation Precautions:  Precautions Precautions: Fall, Cervical Precaution Comments: feeding tube, c collar at all times, left LE external fixator, right foot cast  Required Braces or Orthoses: Cervical Brace Cervical Brace: Hard collar, At all times Restrictions Weight Bearing Restrictions: Yes RLE Weight Bearing: Non weight bearing LLE Weight Bearing: Non weight bearing     See Function Navigator for Current Functional Status.  Therapy/Group: Individual Therapy  [name] 05/08/2016, 12:27 PM

## 2016-05-08 NOTE — Progress Notes (Signed)
Physical Therapy Note  Patient Details  Name: [name] MRN: [id number] Date of Birth: [birth year] Today's Date: 05/08/2016  [id number], 30 min individual tx Make-up session  Pain: pt stated "no"  Pt seen for neuromuscular re-education via multi-modal cues and assistance PRN for supine: 10 x 2 R straight leg raises, short arc quad knee ext, toe flex/ext, hip abduction/adduction, bil glut sets; L active assistive straight leg raises, short arc quad knee est, hip abduction/adduction.  Pt followed a variety of 1 step commands and was minimall conversational, responding to questions about his family and pets. Pt quite restless, and asked for water many times.  Pt left supine in bed with alarm set and mitts on hands.   Aleksia Freiman 05/08/2016, 9:51 AM

## 2016-05-08 NOTE — Progress Notes (Signed)
Speech Language Pathology Weekly Progress and Session Note  Patient Details  Name: [name] MRN: [id number] Date of Birth: 1961/05/24  Beginning of progress report period: May 01, 2016 End of progress report period: May 08, 2016  Today's Date: 05/08/2016 SLP Individual Time: 1300-1400 SLP Individual Time Calculation (min): 60 min   Short Term Goals: Week 1: SLP Short Term Goal 1 (Week 1): Patient will verbalize at the word level in 25% of opportunities with Max A multimodal cues.  SLP Short Term Goal 1 - Progress (Week 1): Progressing toward goal SLP Short Term Goal 2 (Week 1): Patient will vocalize in 25% of opportunities with Max A multimodal cues.  SLP Short Term Goal 2 - Progress (Week 1): Progressing toward goal SLP Short Term Goal 3 (Week 1): Patient will demonstrate sustained attention to a functional task for 60 seconds with Max A multimodal cues.  SLP Short Term Goal 3 - Progress (Week 1): Met SLP Short Term Goal 4 (Week 1): Patient will orient to time, place and situation when given a written choice from a field of 2 with Max A multimodal cues.  SLP Short Term Goal 4 - Progress (Week 1): Met SLP Short Term Goal 5 (Week 1): Patient will perform basic functional tasks (face washing, brushing teeth with suction toothbrush, apply chapstick, etc) with Max A multimodal cues.  SLP Short Term Goal 5 - Progress (Week 1): Progressing toward goal SLP Short Term Goal 6 (Week 1): Patient will consume trials of ice chips with minimal overt s/s of aspiration with Max A multimodal cues over 3 sessions to assess readiness for objective swallow study  SLP Short Term Goal 6 - Progress (Week 1): Progressing toward goal    New Short Term Goals: Week 2: SLP Short Term Goal 1 (Week 2): Patient will verbalize at the word level in 25% of opportunities with Max A multimodal cues.  SLP Short Term Goal 2 (Week 2): Patient will vocalize in 25% of opportunities with Max A multimodal cues.  SLP  Short Term Goal 3 (Week 2): Patient will demonstrate sustained attention to a functional task for 3 mins with Max A multimodal cues.  SLP Short Term Goal 4 (Week 2): Patient will consume trials of ice chips with minimal overt s/s of aspiration with Max A multimodal cues over 3 sessions to assess readiness for objective swallow study  SLP Short Term Goal 5 (Week 2): Patient will perform basic functional tasks (face washing, brushing teeth with suction toothbrush, apply chapstick, etc) with Max A multimodal cues.   Weekly Progress Updates: Pt has made steady gains over the past week and is gradually becoming more engaged and consistently oriented. Timeliness of swallow initiation appears to be improving- will consider object measure of swallow function to determine readiness for PO intake.   Intensity: Minumum of 1-2 x/day, 30 to 90 minutes Frequency: 3 to 5 out of 7 days Duration/Length of Stay: 4 weeks Treatment/Interventions: Cognitive remediation/compensation;Cueing hierarchy;Functional tasks;Patient/family education;Therapeutic Activities;Environmental controls;Dysphagia/aspiration precaution training;Internal/external aids;Speech/Language facilitation   Daily Session  Skilled Therapeutic Interventions: Pt O x self, hospital, [facility] and October independently. Able to reorient to November with min cueing. Pt attentive to trials of ice chips with moderate verbal and visual cueing with intermittent lack of response noted. No overt s/s aspiration with 10 trials of ice chips. 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 Pt initiated comment to inform therapist of need to use the restroom independently, but demonstrated poor awareness of hygiene and cleanliness once placed on bedpan despite  verbal cueing.      Function:   Eating Eating Eating activity did not occur: Safety/medical concerns   Eating Assist Level: Helper performs IV, parenteral or tube feed           Cognition Comprehension Comprehension  assist level: Understands basic 25 - 49% of the time/ requires cueing 50 - 75% of the time  Expression   Expression assist level: Expresses basis less than 25% of the time/requires cueing >75% of the time.  Social Interaction Social Interaction assist level: Interacts appropriately less than 25% of the time. May be withdrawn or combative.  Problem Solving Problem solving assist level: Solves basic less than 25% of the time - needs direction nearly all the time or does not effectively solve problems and may need a restraint for safety  Memory Memory assist level: Recognizes or recalls less than 25% of the time/requires cueing greater than 75% of the time   General    Pain Pain Assessment Pain Assessment: No/denies pain  Therapy/Group: Individual Therapy  Vinetta Bergamo MA, CCC-SLP 05/08/2016, 3:48 PM

## 2016-05-08 NOTE — Progress Notes (Signed)
Patient ID: [name], male   DOB: 03-18-1961, 55 y.o.   MRN: [id number]  Asked to see pt well-known to trauma service after he pulled out his PEG tube last night, time unknown. It was placed 10/20. Pt denies pain.  Abd: Soft, NT, no rebound or guarding  27f foley placed without difficulty or discomfort, balloon inflated.  Instructed RN to place abd binder (as ordered) and to call if there were problems with resuming TF or leakage. Ok to resume TF at goal rate and to use PEG for meds.  Please call with questions.    Lisette Abu, PA-C Pager: [phone] General Trauma PA Pager: [phone]

## 2016-05-08 NOTE — Progress Notes (Signed)
Occupational Therapy Weekly Progress Note  Patient Details  Name: [name] MRN: [id number] Date of Birth: 13-Nov-1960  Beginning of progress report period: May 01, 2016 End of progress report period: May 08, 2016  Patient has met 3 of 4 short term goals.  Pt made steady progress towards STGs during the past week.  Pt continues to required tot multimodal cues for bed mobility tasks but follows one step commands with mod verbal cues for more functional tasks.  Pt requires max A for anterior lean in w/c to facilitate postioning.  Pt completed UB dressing tasks with mod A while seated EOB with min A.  Pt has been nonverbal during therapy sessions but per report from other therapists, pt has been more vocal.  Pt continues to exhibit restless behaviors while seated in w/c and in bed.  Pt exhibits behaviors consistent with [place]. Patient continues to demonstrate the following deficits:muscle weakness, decreased cardiorespiratoy endurance, impaired timing and sequencing, abnormal tone, unbalanced muscle activation, motor apraxia, decreased coordination and decreased motor planning, visual attention to the left, decreased midline orientation, decreased attention to left and decreased motor planning, decreased initiation, decreased attention, decreased awareness, decreased problem solving, decreased safety awareness, decreased memory and delayed processing and decreased sitting balance, decreased postural control, decreased balance strategies and difficulty maintaining precautions and therefore will continue to benefit from skilled OT intervention to enhance overall performance with BADL and Reduce care partner burden.  Patient progressing toward long term goals..  Continue plan of care.  OT Short Term Goals Week 1:  OT Short Term Goal 1 (Week 1): Pt will maintain sustained attention during functional tasks for 60 seconds with mod verbal cues OT Short Term Goal 1 - Progress (Week 1):  Met OT Short Term Goal 2 (Week 1): Pt will follow one step commands for functional tasks with mod demonstration/verbal cues  OT Short Term Goal 2 - Progress (Week 1): Met OT Short Term Goal 3 (Week 1): Pt will initiate/perform anterior lean in w/c to facilitate UB dressing tasks with mod A OT Short Term Goal 3 - Progress (Week 1): Progressing toward goal OT Short Term Goal 4 (Week 1): Pt will thread BUE into shirt sleeves with max A OT Short Term Goal 4 - Progress (Week 1): Met Week 2:  OT Short Term Goal 1 (Week 2): Pt will initiate/perform anterior lean in w/c to facilitate UB dressing tasks with mod A OT Short Term Goal 2 (Week 2): Pt will follow one step commands during functional tasks with min verbal cues OT Short Term Goal 3 (Week 2): Pt will initiate threading BLE into pants with max A while seated EOB OT Short Term Goal 4 (Week 2): Pt will maintain sitting balance EOB with close supervison while completing UB bathing/dressing tasks   Therapy Documentation Precautions:  Precautions Precautions: Fall, Cervical Precaution Comments: feeding tube, c collar at all times, left LE external fixator, right foot cast  Required Braces or Orthoses: Cervical Brace Cervical Brace: Hard collar, At all times Restrictions Weight Bearing Restrictions: Yes RLE Weight Bearing: Non weight bearing LLE Weight Bearing: Non weight bearing  See Function Navigator for Current Functional Status.    [name] [facility] 05/08/2016, 2:55 PM
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
## 2016-05-08 NOTE — Progress Notes (Signed)
Occupational Therapy Note  Patient Details  Name: [name] MRN: [id number] Date of Birth: [birth year]  Today's Date: 05/08/2016 OT Individual Time: 1400-1430 OT Individual Time Calculation (min): 30 min    Pt with no s/s of pain Individual therapy  Pt on bedpan upon arrival with SLP attending.  Pt restless with continuous BUE movements.  Pt required tot A +2 for hygiene, clothing management, and bed mobility tasks.  Pt not following one step commands during this session and non verbal.  Focus on following one step commands, bed mobility, and activity tolerance.   [name] [facility] 05/08/2016, 2:48 PM

## 2016-05-09 ENCOUNTER — Inpatient Hospital Stay (HOSPITAL_COMMUNITY): Payer: No Typology Code available for payment source | Admitting: Speech Pathology

## 2016-05-09 ENCOUNTER — Inpatient Hospital Stay (HOSPITAL_COMMUNITY): Payer: No Typology Code available for payment source

## 2016-05-09 ENCOUNTER — Inpatient Hospital Stay (HOSPITAL_COMMUNITY): Payer: Self-pay | Admitting: *Deleted

## 2016-05-09 LAB — GLUCOSE, CAPILLARY
Glucose-Capillary: 126 mg/dL — ABNORMAL HIGH (ref 65–99)
Glucose-Capillary: 135 mg/dL — ABNORMAL HIGH (ref 65–99)
Glucose-Capillary: 129 mg/dL — ABNORMAL HIGH (ref 65–99)
Glucose-Capillary: 168 mg/dL — ABNORMAL HIGH (ref 65–99)

## 2016-05-09 NOTE — Progress Notes (Signed)
Occupational Therapy Note  Patient Details  Name: [name] MRN: [id number] Date of Birth: 1961-07-02  Today's Date: 05/09/2016 OT Individual Time: 1400-1415 OT Individual Time Calculation (min): 15 min    Pt with no s/s of pain Individual therapy  Pt resting in TIS w/c upon arrival.  Pt engaged in two table activities with focus on following one step commands, task initiation, sequencing, and attention to task.  Pt presented with ten numbered discs and requested to place in sequential order.  Pt required demonstrational cue to initiate task and mod verbal cues to continue task.  Pt transitioned to sorting deck of cards by suit.  Pt required max multimodal cues to initiate and complete task.  Pt attended to tasks approx 15 seconds before requiring verbal cues to redirect.  Pt verbalized "restroom" and returned to room and handed off to PT.   [name] [facility] 05/09/2016, 2:47 PM

## 2016-05-09 NOTE — Progress Notes (Signed)
Speech Language Pathology Daily Session Note  Patient Details  Name: [name] MRN: [id number] Date of Birth: [birth year]  Today's Date: 05/09/2016 SLP Individual Time: 1000-1100  SLP Individual Time: 1300-1330 SLP Individual Time Calculation (min): 90 min   Short Term Goals: Week 2: SLP Short Term Goal 1 (Week 2): Patient will verbalize at the word level in 25% of opportunities with Max A multimodal cues.  SLP Short Term Goal 2 (Week 2): Patient will vocalize in 25% of opportunities with Max A multimodal cues.  SLP Short Term Goal 3 (Week 2): Patient will demonstrate sustained attention to a functional task for 3 mins with Max A multimodal cues.  SLP Short Term Goal 4 (Week 2): Patient will consume trials of ice chips with minimal overt s/s of aspiration with Max A multimodal cues over 3 sessions to assess readiness for objective swallow study  SLP Short Term Goal 5 (Week 2): Patient will perform basic functional tasks (face washing, brushing teeth with suction toothbrush, apply chapstick, etc) with Max A multimodal cues.   Skilled Therapeutic Interventions:Session 1: Oral care completed by RN immediately prior to session. Pt consumed 3 oz water/ice chips via teaspoon without overt s/s aspiration. Pt Ox self [place] and hospital, but required assistance for month/year. Unable to recall accurate information after 10 min delay. Max verbal cueing to elicit vocalizations. True throat clearing noted x 4. Pt conversant with mod A and able to provide basic biographical information such as names of family members and life timeline of important events with mod A for initiation. Session 2: Pt less verbally interactive this session, but was O x 3 at mod I. Again tolerated trialed of ice and water via teaspoon without overt s/s aspiration. Will consider MBSS soon.    Function:  Eating Eating     Eating Assist Level: Helper performs IV, parenteral or tube feed            Cognition Comprehension Comprehension assist level: Understands basic 25 - 49% of the time/ requires cueing 50 - 75% of the time  Expression   Expression assist level: Expresses basis less than 25% of the time/requires cueing >75% of the time.  Social Interaction Social Interaction assist level: Interacts appropriately less than 25% of the time. May be withdrawn or combative.  Problem Solving Problem solving assist level: Solves basic less than 25% of the time - needs direction nearly all the time or does not effectively solve problems and may need a restraint for safety  Memory Memory assist level: Recognizes or recalls 25 - 49% of the time/requires cueing 50 - 75% of the time    Pain Pain Assessment Pain Assessment: No/denies pain  Therapy/Group: Individual Therapy  Vinetta Bergamo MA, CCC-SLP 05/09/2016, 4:04 PM

## 2016-05-09 NOTE — Progress Notes (Signed)
Physical Therapy Session Note  Patient Details  Name: [name] MRN: [id number] Date of Birth: 1961/04/20  Today's Date: 05/09/2016 PT Individual Time: [id number] PT Individual Time Calculation (min): 50 min  and Today's Date: 05/09/2016 PT Missed Time: 10 Minutes Missed Time Reason: Patient fatigue    Short Term Goals: Week 2:  PT Short Term Goal 1 (Week 2): Pt will demonstrate sustained attention x 30 seconds with max cuing. PT Short Term Goal 2 (Week 2): Pt will initate rolling in bed with max cuing. PT Short Term Goal 3 (Week 2): Pt will roll L/R in bed with bed rails and max assist +1. PT Short Term Goal 4 (Week 2): Pt will initiate supine<>sit transfer.  Skilled Therapeutic Interventions/Progress Updates:    Handoff from OT with pt up in w/c reporting he needs to have bowel movement. Using maxi move transferred onto [facility] with +2 for trial with sitting on BSC to progress ability to empty bowel adequately. Pt able to maintain sitting balance with overall min assist and mod to max verbal cues for attention to task for weightshift to finish getting pants down and check brief with +2 assist needed for safety. Pt noted to have already have BM, so transferred back to bed via maxi move with +2 assist and instructed in rolling for hygiene, brief change, and clothing management with focus on pt following simple commands to engage in therapeutic bed mobility. Pt progressively fatigued throughout rolling and falling asleep with decreased participation. Missed last 10 min of session   Therapy Documentation Precautions:  Precautions Precautions: Fall, Cervical Precaution Comments: feeding tube, c collar at all times, left LE external fixator, right foot cast  Required Braces or Orthoses: Cervical Brace Cervical Brace: Hard collar, At all times Restrictions Weight Bearing Restrictions: Yes RLE Weight Bearing: Non weight bearing LLE Weight Bearing: Non weight bearing General: PT Amount of  Missed Time (min): 10 Minutes PT Missed Treatment Reason: Patient fatigue  Pain:  Denies pain.   See Function Navigator for Current Functional Status.   Therapy/Group: Individual Therapy  [name], PT, DPT  05/09/2016, 3:34 PM

## 2016-05-09 NOTE — Plan of Care (Signed)
Problem: RH BLADDER ELIMINATION Goal: RH STG MANAGE BLADDER WITH ASSISTANCE STG Manage Bladder With max Assistance   Outcome: Not Progressing incontinent

## 2016-05-09 NOTE — Progress Notes (Signed)
Social Work Patient ID: [name], male   DOB: January 14, 1961, 55 y.o.   MRN: [id number]   Have reviewed team conference with pt's wife who is aware and agreeable with targeted LOS of 4 weeks.  Understands that it is too early to predict date.  Discussed issues being addressed by tx team both physically and cognitively.  Wife and family continue to be very supportive.  Wife currently trying to continue working until she is needed to begin education.    [name], LCSW

## 2016-05-09 NOTE — Progress Notes (Signed)
1409 05/09/16 nursing ST came to RN around 1331 re: pt having black stare during his therapy session this am around 10-11 am. Patient was able to be redirected after 10-11 secs. Dan PA made aware no new orders but to observe patient.
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
## 2016-05-09 NOTE — Progress Notes (Signed)
Occupational Therapy Session Note  Patient Details  Name: [name] MRN: [id number] Date of Birth: [birth year]  Today's Date: 05/09/2016 OT Individual Time: [id number] OT Individual Time Calculation (min): 75 min     Short Term Goals: Week 2:  OT Short Term Goal 1 (Week 2): Pt will initiate/perform anterior lean in w/c to facilitate UB dressing tasks with mod A OT Short Term Goal 2 (Week 2): Pt will follow one step commands during functional tasks with min verbal cues OT Short Term Goal 3 (Week 2): Pt will initiate threading BLE into pants with max A while seated EOB OT Short Term Goal 4 (Week 2): Pt will maintain sitting balance EOB with close supervison while completing UB bathing/dressing tasks  Skilled Therapeutic Interventions/Progress Updates:    Pt resting in bed upon arrival.  Pt incontinent of bladder and required changing prior to getting OOB.  Pt required max multimodal cues and tot A +2 for bed mobility to change brief, toilet hygiene, and donning shorts.  Pt transferred to w/c with maximove (+2).  Pt completed UB bathing tasks seated in w/c with min verbal cues for initiation.  Pt threaded BUE in shirt sleeves but required assistance with pulling over head and trunk.  Pt transitioned to table activities (pipe tree and peg board). Pt required max multimodal cues to initiate both tasks and complete tasks.  Pt was nonverbal throughout session.  Pt remained in w/c at nursing station at end of session.  Therapy Documentation Precautions:  Precautions Precautions: Fall, Cervical Precaution Comments: feeding tube, c collar at all times, left LE external fixator, right foot cast  Required Braces or Orthoses: Cervical Brace Cervical Brace: Hard collar, At all times Restrictions Weight Bearing Restrictions: Yes RLE Weight Bearing: Non weight bearing LLE Weight Bearing: Non weight bearing General: General OT Amount of Missed Time: 15 Minutes Vital Signs:  Pain:    ADL: ADL ADL Comments: see functional navigator Exercises:   Other Treatments:    See Function Navigator for Current Functional Status.   Therapy/Group: Individual Therapy  Leroy Libman 05/09/2016, 11:00 AM

## 2016-05-09 NOTE — Progress Notes (Signed)
Physical Therapy Session Note  Patient Details  Name: [name] MRN: [id number] Date of Birth: 03-16-1961  Today's Date: 05/09/2016 PT Individual Time: 1130-1200 PT Individual Time Calculation (min): 30 min    Short Term Goals: Week 2:  PT Short Term Goal 1 (Week 2): Pt will demonstrate sustained attention x 30 seconds with max cuing. PT Short Term Goal 2 (Week 2): Pt will initate rolling in bed with max cuing. PT Short Term Goal 3 (Week 2): Pt will roll L/R in bed with bed rails and max assist +1. PT Short Term Goal 4 (Week 2): Pt will initiate supine<>sit transfer.  Skilled Therapeutic Interventions/Progress Updates:    Pt up in w/c - focused on activity to follow simple commands and address attention with instructions to compile all red items on table from a group of multi colored. Pt able to place 4 red items together and then verbalized that he needed to use the toilet to have a bowel movement. Returned back to room and transferred via Maxi move and +2 assist to bed. Total assist for rolling with verbal and tactile cues for using bed rail to assist with rolling - inconsistent for changing of brief, hygiene, placing and removing bedpan, and clothing management (+2 for those tasks). Pt already had soiled in brief and did not further have BM once bedpan placed despite pt reporting he still needed to go. Used maxi move to transfer back into w/c after this, repositioned and mittens and quick release belt applied.    Therapy Documentation Precautions:  Precautions Precautions: Fall, Cervical Precaution Comments: feeding tube, c collar at all times, left LE external fixator, right foot cast  Required Braces or Orthoses: Cervical Brace Cervical Brace: Hard collar, At all times Restrictions Weight Bearing Restrictions: Yes RLE Weight Bearing: Non weight bearing LLE Weight Bearing: Non weight bearing General:   Vital Signs:  Pain:   Mobility:   Locomotion :    Trunk/Postural  Assessment :    Balance:   Exercises:   Other Treatments:     See Function Navigator for Current Functional Status.   Therapy/Group: Individual Therapy  [name], PT, DPT  05/09/2016, 12:09 PM
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
## 2016-05-10 ENCOUNTER — Inpatient Hospital Stay (HOSPITAL_COMMUNITY): Payer: No Typology Code available for payment source

## 2016-05-10 ENCOUNTER — Inpatient Hospital Stay (HOSPITAL_COMMUNITY): Payer: Self-pay

## 2016-05-10 ENCOUNTER — Inpatient Hospital Stay (HOSPITAL_COMMUNITY): Payer: Self-pay | Admitting: Physical Therapy

## 2016-05-10 ENCOUNTER — Inpatient Hospital Stay (HOSPITAL_COMMUNITY): Payer: No Typology Code available for payment source | Admitting: Speech Pathology

## 2016-05-10 ENCOUNTER — Inpatient Hospital Stay (HOSPITAL_COMMUNITY): Payer: Self-pay | Admitting: Occupational Therapy

## 2016-05-10 LAB — GLUCOSE, CAPILLARY
Glucose-Capillary: 103 mg/dL — ABNORMAL HIGH (ref 65–99)
Glucose-Capillary: 111 mg/dL — ABNORMAL HIGH (ref 65–99)
Glucose-Capillary: 121 mg/dL — ABNORMAL HIGH (ref 65–99)
Glucose-Capillary: 174 mg/dL — ABNORMAL HIGH (ref 65–99)

## 2016-05-10 NOTE — Progress Notes (Signed)
Occupational Therapy Session Note  Patient Details  Name: [name] MRN: [id number] Date of Birth: [birth year]  Today's Date: 05/10/2016 OT Individual Time: 1100-1130 OT Individual Time Calculation (min): 30 min     Short Term Goals: Week 1:  OT Short Term Goal 1 (Week 1): Pt will maintain sustained attention during functional tasks for 60 seconds with mod verbal cues OT Short Term Goal 1 - Progress (Week 1): Met OT Short Term Goal 2 (Week 1): Pt will follow one step commands for functional tasks with mod demonstration/verbal cues  OT Short Term Goal 2 - Progress (Week 1): Met OT Short Term Goal 3 (Week 1): Pt will initiate/perform anterior lean in w/c to facilitate UB dressing tasks with mod A OT Short Term Goal 3 - Progress (Week 1): Progressing toward goal OT Short Term Goal 4 (Week 1): Pt will thread BUE into shirt sleeves with max A OT Short Term Goal 4 - Progress (Week 1): Met  Skilled Therapeutic Interventions/Progress Updates:   1:1 Pt with soiled brief when arrived. Focus on rolling in bed including initiation of task with max A +2 for brief change and to don LEs. Pt able to perform one step commands with extra time with mod VC.  Pt reported he was bored; engaged in conversation about interests including musical interest.  Placed music box in room on country station when in between activities. Pt brushed teeth with setup and A for suction at sink with decr delay in response (cognitively and motorical). Pt oriented to self, place, situation but other than head injury unsure what other injuries he has had.  Left performing color block activity working on following one step directions and sustained attention with next therapist.  Therapy Documentation Precautions:  Precautions Precautions: Fall, Cervical Precaution Comments: feeding tube, c collar at all times, left LE external fixator, right foot cast  Required Braces or Orthoses: Cervical Brace Cervical Brace: Hard collar, At  all times Restrictions Weight Bearing Restrictions: Yes RLE Weight Bearing: Non weight bearing LLE Weight Bearing: Non weight bearing Pain: No c/o pain See Function Navigator for Current Functional Status.   Therapy/Group: Individual Therapy  Willeen Cass [facility] 05/10/2016, 2:57 PM

## 2016-05-10 NOTE — Progress Notes (Signed)
Physical Therapy Session Note  Patient Details  Name: [name] MRN: [id number] Date of Birth: April 05, 1961  Today's Date: 05/10/2016 PT Individual Time: 1300-1400 PT Individual Time Calculation (min): 60 min   Skilled Therapeutic Interventions/Progress Updates:    Required increased time to arouse from sleep and maintain alertness enough to engage with therapist. Pt nonverbal for 50% of therapy session - last 30 min started to communicate more in short sentences. Pt also becoming increasingly restless as session progressed.   Bed mobility with total assist to check brief prior to OOB and to don sling with total assist for rolling and increased time needed to follow simple commands initially. Maxi move with +2 assist to transfer into w/c.  Set up patient for slideboard transfer from w/c -> mat to focus on initiation, attention and sequencing. Pt unable to complete transfer successfully due to inability to maintain NWB status. Pt initiating movement but each attempt PT had to stop patient due to WB restrictions. Attempted various positions to attempt to maintain NWB status but ultimately unsuccessful.    Therapy Documentation Precautions:  Precautions Precautions: Fall, Cervical Precaution Comments: feeding tube, c collar at all times, left LE external fixator, right foot cast  Required Braces or Orthoses: Cervical Brace Cervical Brace: Hard collar, At all times Restrictions Weight Bearing Restrictions: Yes RLE Weight Bearing: Non weight bearing LLE Weight Bearing: Non weight bearing   Pain: No complaints.   See Function Navigator for Current Functional Status.   Therapy/Group: Individual Therapy  [name], PT, DPT  05/10/2016, 2:27 PM
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
## 2016-05-10 NOTE — Progress Notes (Signed)
Physical Therapy Note  Patient Details  Name: KEEVIN BAGNATO MRN: [id number] Date of Birth: 02-28-1961 Today's Date: 05/10/2016    Time: 1130-1200 30 minutes  1:1 No signs/symptoms of pain. Pt performed sitting balance in w/c without waist belt for ball toss and kick activity with close supervision for balance, mod visual and verbal cuing for attention to task and to initiate tossing ball. Pt able to catch ball 14/15 trials. Attempted to have pt kick with Lt LE but pt requires max cuing to use Lt LE, will kick 90% of trials with Rt LE.  Pt able to write his name and address with perseveration on letters and numbers. Able to copy shapes with perseveration as well.  Pt then vocalizes need to use bathroom. Pt placed in bed with maxi move, Max/total A for rolling to place bed pan.  Pt left in room with nursing present, needs at hand.   Allisha Harter 05/10/2016, 2:29 PM

## 2016-05-10 NOTE — Progress Notes (Signed)
Physical Therapy Session Note  Patient Details  Name: [name] MRN: [id number] Date of Birth: 1961-06-04  Today's Date: 05/10/2016 PT Individual Time: 1415-1524 (15 minutes make up ) PT Individual Time Calculation (min): 69 min    Short Term Goals: Week 1:  PT Short Term Goal 1 (Week 1): Pt will demonstrate sustained attention x 1 minute with supervision. PT Short Term Goal 1 - Progress (Week 1): Not met PT Short Term Goal 2 (Week 1): Pt will perform rolling L/R in bed with bed features & max assist +1.  PT Short Term Goal 2 - Progress (Week 1): Not met PT Short Term Goal 3 (Week 1): Pt will consistently demonstrate sitting balance with mod assist. PT Short Term Goal 3 - Progress (Week 1): Met PT Short Term Goal 4 (Week 1): Pt will transfer supine<>sitting with bed features & max assist + 1. PT Short Term Goal 4 - Progress (Week 1): Not met Week 2:  PT Short Term Goal 1 (Week 2): Pt will demonstrate sustained attention x 30 seconds with max cuing. PT Short Term Goal 2 (Week 2): Pt will initate rolling in bed with max cuing. PT Short Term Goal 3 (Week 2): Pt will roll L/R in bed with bed rails and max assist +1. PT Short Term Goal 4 (Week 2): Pt will initiate supine<>sit transfer.  Skilled Therapeutic Interventions/Progress Updates:      Patient received sitting in WC at nurses station and agreeable to PT. PT transported patient in rehab gym and transferred via [facility] with +2 total Assist. Sitting balance with min-supervision Assist while performed punching bag with attempted to follow 1 and 2 step commands for sequencing of R and L punches. Sitting EOM, PT instructed patient in sorting bean bags by color and follow 1 and 2 step commands to hand proper color to rehab tech. Clothes pin tree to attempt additional 1 and 2 step commands; patient unable to follow >1 step command and continued to resist movement/use of the LUE Through all sustained attention tasks sitting EOB.   Patient  returned to [facility] via maximove. Dynavision sitting in WC, Setting A, 4 rows, all quadrants x 5 trials of 60 seconds. Patient consistently able to identify and respond to targets on R side on L lower quadrant; but unable to respond to targets in upper left quadrant. Throughout dynavision training, patient only initated movement with the LUE in conjunction with the RUE.   Forced use of the LUE with ball hold and toss x 10 minutes, with max cues to engage LUE in purpusful movement.  Patient returned to nurses station with quick release belt, posey mittens, and all needs met.     Therapy Documentation Precautions:  Precautions Precautions: Fall, Cervical Precaution Comments: feeding tube, c collar at all times, left LE external fixator, right foot cast  Required Braces or Orthoses: Cervical Brace Cervical Brace: Hard collar, At all times Restrictions Weight Bearing Restrictions: Yes RLE Weight Bearing: Non weight bearing LLE Weight Bearing: Non weight bearing   See Function Navigator for Current Functional Status.   Therapy/Group: Individual Therapy  [name] 05/10/2016, 5:59 PM
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
## 2016-05-11 ENCOUNTER — Inpatient Hospital Stay (HOSPITAL_COMMUNITY): Payer: No Typology Code available for payment source | Admitting: *Deleted

## 2016-05-11 ENCOUNTER — Inpatient Hospital Stay (HOSPITAL_COMMUNITY): Payer: No Typology Code available for payment source | Admitting: Speech Pathology

## 2016-05-11 DIAGNOSIS — S069X3D Unspecified intracranial injury with loss of consciousness of 1 hour to 5 hours 59 minutes, subsequent encounter: Secondary | ICD-10-CM

## 2016-05-11 LAB — GLUCOSE, CAPILLARY
Glucose-Capillary: 105 mg/dL — ABNORMAL HIGH (ref 65–99)
Glucose-Capillary: 121 mg/dL — ABNORMAL HIGH (ref 65–99)
Glucose-Capillary: 134 mg/dL — ABNORMAL HIGH (ref 65–99)
Glucose-Capillary: 97 mg/dL (ref 65–99)
Glucose-Capillary: 99 mg/dL (ref 65–99)

## 2016-05-11 NOTE — Progress Notes (Signed)
Physical Therapy Session Note  Patient Details  Name: [name] MRN: [id number] Date of Birth: [birth year]  Today's Date: 05/11/2016 PT Individual Time: 1000-1100 PT Individual Time Calculation (min): 60 min     Skilled Therapeutic Interventions/Progress Updates:  Session initiated with rolling in bed R and L with max to total assistance in order to don shorts. Patient cued to use UE to grab bed rails but unable to perform.  Transfer to w/c via mechanical lift, in gym transfer to mat in the same fashion.  Sitting balance training with activities to focusing on weight shifting and displacement COM outside BOS. Patient also engaged in naming objects he is reaching for ( bean bags with food pictures). Clothes pins activity with focus on accuracy and core activation to reach forward and sideways.  Training in scooting on mat R and L with max cues and demonstration, patient able to complete very few small scoots and change his position.  Assisted patient in transfer to w/c via sliding board -max A.  Patient left in tilt in space w/c with QR ,mittens on at the nurses desk.    Therapy Documentation Precautions:  Precautions Precautions: Fall, Cervical Precaution Comments: feeding tube, c collar at all times, left LE external fixator, right foot cast  Required Braces or Orthoses: Cervical Brace Cervical Brace: Hard collar, At all times Restrictions Weight Bearing Restrictions: Yes RLE Weight Bearing: Non weight bearing LLE Weight Bearing: Non weight bearing   See Function Navigator for Current Functional Status.   Therapy/Group: Individual Therapy  [place]pe Spanish 05/11/2016, 12:48 PM

## 2016-05-11 NOTE — Progress Notes (Signed)
Patient found this morning with mitts off, removed abdominal binder and dressing to peg site. Patient removed all 3 again 4 times during shift. Frequent rounding to check, and supervised by family for a period of time. Patient also got up to sitting on the side of the bed and was attempting to stand when NT arrived responding to bed alarm. Reported to oncoming RN.

## 2016-05-11 NOTE — Progress Notes (Signed)
Speech Language Pathology Daily Session Note  Patient Details  Name: RAFEEQ Hart MRN: [id number] Date of Birth: [birth year]  Today's Date: 05/11/2016 SLP Individual Time: 1400-1445 SLP Individual Time Calculation (min): 45 min   Short Term Goals: Week 2: SLP Short Term Goal 1 (Week 2): Patient will verbalize at the word level in 25% of opportunities with Max A multimodal cues.  SLP Short Term Goal 2 (Week 2): Patient will vocalize in 25% of opportunities with Max A multimodal cues.  SLP Short Term Goal 3 (Week 2): Patient will demonstrate sustained attention to a functional task for 3 mins with Max A multimodal cues.  SLP Short Term Goal 4 (Week 2): Patient will consume trials of ice chips with minimal overt s/s of aspiration with Max A multimodal cues over 3 sessions to assess readiness for objective swallow study  SLP Short Term Goal 5 (Week 2): Patient will perform basic functional tasks (face washing, brushing teeth with suction toothbrush, apply chapstick, etc) with Max A multimodal cues.   Skilled Therapeutic Interventions: Skilled treatment session focused on cognition and dysphagia goals. SLP facilitated session by providing hand over hand assist for oral care. Pt required extensive oral care and education provided to wife on how to perform oral care. Trials of ice chips administered via spoon without overt s/s of aspiration. After instruction, pt able to spoon ice chip on spoon and then place spoon back into cup with Mod A verbal cues for impulsivity. Pt consumed thin water via cup with hand over hand initially for impulsivity. No overt s/s of aspiration observed with 12 oz water. Pt then allowed to control cup with 3 consecutive sips but trials. Pt with wet voice and throat clear with consecutive sips. Education provided to wife on swallow function and indication of wet voice throat clear. Also explain that pt remains NPO until possible swallow study in coming week. She voiced  understanding. Pt able to recall month, year, season and location independently. Pt required Min A semantic cues for day of week and next Holiday. Pt required Max A hand over hand for brushing teeth, applying chapstick and washing face. Pt left in bed with alarm on and protective mittens off while in wife's presence. Nursing aware that mittens were off and allowed while wife present. Continue current plan of care.    Function:  Eating Eating   Modified Consistency Diet:  (Ice chips/water with ST only) Eating Assist Level: Supervision or verbal cues;Hand over hand assist;Help managing cup/glass   Eating Set Up Assist For:  (hand over hand for impulsivity)       Cognition Comprehension Comprehension assist level: Understands basic 25 - 49% of the time/ requires cueing 50 - 75% of the time  Expression   Expression assist level: Expresses basic 25 - 49% of the time/requires cueing 50 - 75% of the time. Uses single words/gestures.  Social Interaction Social Interaction assist level: Interacts appropriately 25 - 49% of time - Needs frequent redirection.  Problem Solving Problem solving assist level: Solves basic 25 - 49% of the time - needs direction more than half the time to initiate, plan or complete simple activities  Memory Memory assist level: Recognizes or recalls 25 - 49% of the time/requires cueing 50 - 75% of the time    Pain Pain Assessment Pain Assessment: Faces Faces Pain Scale: Hurts little more (patient patting,pointing at legs, vocalizing) Pain Type: Acute pain Pain Location: Leg Pain Orientation: Right;Left Pain Descriptors / Indicators: Aching Pain Intervention(s): Medication (  See eMAR)  Therapy/Group: Individual Therapy  Carole Doner 05/11/2016, 3:23 PM
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
## 2016-05-12 ENCOUNTER — Inpatient Hospital Stay (HOSPITAL_COMMUNITY): Payer: No Typology Code available for payment source | Admitting: Occupational Therapy

## 2016-05-12 LAB — GLUCOSE, CAPILLARY
Glucose-Capillary: 119 mg/dL — ABNORMAL HIGH (ref 65–99)
Glucose-Capillary: 110 mg/dL — ABNORMAL HIGH (ref 65–99)
Glucose-Capillary: 131 mg/dL — ABNORMAL HIGH (ref 65–99)

## 2016-05-12 NOTE — Progress Notes (Signed)
NT responded to bed alarm, found patient sitting on side of bed attempting to stand up. NT transferred him to chair and brought to nurses' station for supervision. MD made aware. Will continue to monitor.
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
## 2016-05-12 NOTE — Progress Notes (Signed)
Occupational Therapy Session Note  Patient Details  Name: [name] MRN: [id number] Date of Birth: Oct 22, 1960  Today's Date: 05/12/2016 OT Individual Time: [id number] OT Individual Time Calculation (min): 75 min     Short Term Goals: Week 2:  OT Short Term Goal 1 (Week 2): Pt will initiate/perform anterior lean in w/c to facilitate UB dressing tasks with mod A OT Short Term Goal 2 (Week 2): Pt will follow one step commands during functional tasks with min verbal cues OT Short Term Goal 3 (Week 2): Pt will initiate threading BLE into pants with max A while seated EOB OT Short Term Goal 4 (Week 2): Pt will maintain sitting balance EOB with close supervison while completing UB bathing/dressing tasks  Skilled Therapeutic Interventions/Progress Updates:   Patient awake and able to return responses to questions such as, "Are you hurting" and "where?"    Stated his stomach hurt 10 out 10 on a 0-10 pain scale.    He was able to attend and focus and initiate self care tasks though he put his deodoarant under his right arm pit but neglected the left arm pit and instead started to put the deodoant in his mouth instead.     For grooming (face, hands and oral care), He demonstrated focus, selective attention to wring out the wash cloth and wash his face; he was able to attend to brush his teeth for 5 seconds x2 tries & upon  Instruction, he was able to wipe out his mouth with wet wash cloth after brushing teeth; he was able with min cues and close supervision open the mouth moisturizer and rubbed some onto his gums (to prevent assist with dry mouth and rubbed some onto his lips)  At end the session, RN gave into room and assess patient's binder (protecting/hiding his peg tubing) was too short and possibly causing some of the stomach irritation/pain to which the patient referred);   A correct sized binder was being ordered for the patient.  At the end of session, patient was assisted with help of  nursing onto the bed pan as he requested.  And then he was left in the care of nursing with bed rails up and alarm engaged and call bell within reach. Therapy Documentation  Precautions:  Precautions Precautions: Fall, Cervical Precaution Comments: feeding tube, c collar at all times, left LE external fixator, right foot cast  Required Braces or Orthoses: Cervical Brace Cervical Brace: Hard collar, At all times Restrictions Weight Bearing Restrictions: Yes RLE Weight Bearing: Non weight bearing LLE Weight Bearing: Non weight bearing  Pain:10/10 stomach "sticking" until RN assessed that per haps binder was too short and pressing peg tubing into patient's tender peg site area.   A proper fitting binder was order.  With adjustment to the binder and peg tubing, patient replied to the nurse he had no more pain    See Function Navigator for Current Functional Status.   Therapy/Group: Individual Therapy  Alfredia Ferguson [facility] 05/12/2016, 8:50 AM

## 2016-05-13 ENCOUNTER — Inpatient Hospital Stay (HOSPITAL_COMMUNITY): Payer: No Typology Code available for payment source | Admitting: Speech Pathology

## 2016-05-13 ENCOUNTER — Inpatient Hospital Stay (HOSPITAL_COMMUNITY): Payer: No Typology Code available for payment source | Admitting: Physical Therapy

## 2016-05-13 ENCOUNTER — Inpatient Hospital Stay (HOSPITAL_COMMUNITY): Payer: Self-pay

## 2016-05-13 LAB — GLUCOSE, CAPILLARY
Glucose-Capillary: 110 mg/dL — ABNORMAL HIGH (ref 65–99)
Glucose-Capillary: 116 mg/dL — ABNORMAL HIGH (ref 65–99)
Glucose-Capillary: 175 mg/dL — ABNORMAL HIGH (ref 65–99)
Glucose-Capillary: 99 mg/dL (ref 65–99)

## 2016-05-13 MED ORDER — ENOXAPARIN SODIUM 30 MG/0.3ML ~~LOC~~ SOLN
30.0000 mg | Freq: Two times a day (BID) | SUBCUTANEOUS | Status: DC
  Administered 2016-05-13 – 2016-06-02 (×42): 30 mg via SUBCUTANEOUS
  Filled 2016-05-13 (×40): qty 0.3

## 2016-05-13 NOTE — Progress Notes (Signed)
Occupational Therapy Session Note  Patient Details  Name: [name] MRN: [id number] Date of Birth: [birth year]  Today's Date: 05/13/2016 OT Individual Time: 0900-1000 OT Individual Time Calculation (min): 60 min     Short Term Goals: Week 2:  OT Short Term Goal 1 (Week 2): Pt will initiate/perform anterior lean in w/c to facilitate UB dressing tasks with mod A OT Short Term Goal 2 (Week 2): Pt will follow one step commands during functional tasks with min verbal cues OT Short Term Goal 3 (Week 2): Pt will initiate threading BLE into pants with max A while seated EOB OT Short Term Goal 4 (Week 2): Pt will maintain sitting balance EOB with close supervison while completing UB bathing/dressing tasks  Skilled Therapeutic Interventions/Progress Updates:    Pt resting in w/c upon arrival with RN attending.  Pt initially engaged in sliding board transfer w/c<>mat with tot A +2 (pt=25%) and max verbal cues for sequencing and safety.  Pt c/o increased neck pain with continued unsupported sitting and transferred back to w/c for table tasks-selecting appropriate nuts for bolts.  Pt initially required min verbal cues for accuracy but increased to max verbal cues as task progressed.  Pt required max verbal cues for attention to task as task progressed.  Pt requested [place] of bathroom and returned to room and transferred to bed with sliding board.  Pt required tot A for use of urinal in bed.  Pt returned to w/c with sliding board.  All transfers required tot A +2.  Pt remained in w/c with QRB in place and bilateral mitts donned.   Therapy Documentation Precautions:  Precautions Precautions: Fall, Cervical Precaution Comments: feeding tube, c collar at all times, left LE external fixator, right foot cast  Required Braces or Orthoses: Cervical Brace Cervical Brace: Hard collar, At all times Restrictions Weight Bearing Restrictions: Yes RLE Weight Bearing: Non weight bearing LLE Weight Bearing: Non weight  bearing General:   Vital Signs: Therapy Vitals Pulse Rate: 93 BP: 135/82 Pain: Pain Assessment Pain Assessment: 0-10 Pain Score: 5  (pt states "better") Pain Type: Acute pain Pain Location: Neck Pain Orientation: Right;Left Pain Descriptors / Indicators: Aching Pain Frequency: Intermittent Pain Onset: Gradual Pain Intervention(s): Medication (See eMAR) ADL: ADL ADL Comments: see functional navigator Exercises:   Other Treatments:    See Function Navigator for Current Functional Status.   Therapy/Group: Individual Therapy  Leroy Libman 05/13/2016, 12:10 PM

## 2016-05-13 NOTE — Progress Notes (Signed)
Speech Language Pathology Daily Session Notes  Patient Details  Name: [name] MRN: [id number] Date of Birth: 1961/04/07  Today's Date: 05/13/2016  Session 1: SLP Individual Time: 1005-1100 SLP Individual Time Calculation (min): 55 min    Session 2: SLP Individual Time: 1430-1500 SLP Individual Time Calculation (min): 30 min  Short Term Goals: Week 2: SLP Short Term Goal 1 (Week 2): Patient will verbalize at the word level in 25% of opportunities with Max A multimodal cues.  SLP Short Term Goal 2 (Week 2): Patient will vocalize in 25% of opportunities with Max A multimodal cues.  SLP Short Term Goal 3 (Week 2): Patient will demonstrate sustained attention to a functional task for 3 mins with Max A multimodal cues.  SLP Short Term Goal 4 (Week 2): Patient will consume trials of ice chips with minimal overt s/s of aspiration with Max A multimodal cues over 3 sessions to assess readiness for objective swallow study  SLP Short Term Goal 5 (Week 2): Patient will perform basic functional tasks (face washing, brushing teeth with suction toothbrush, apply chapstick, etc) with Max A multimodal cues.   Skilled Therapeutic Interventions:  Session 1: Skilled treatment session focused on dysphagia and cognitive goals. SLP facilitated session by providing thorough oral care via the suction toothbrush and Mod A verbal cues for patient to utilize a washcloth to "clean" his lips. Patient consumed trials of ice chips and thin liquids via tsp without overt s/s of aspiration. Patient also consumed controlled sips of thin liquids via cup without overt s/s of aspiration, however, once independence was increased, patient consumed large, sequential sips with immediate subtle cough.  Recommend continued trials with SLP only. Patient with increased verbal expression this session but required Max A verbal cues for increased vocal intensity at the phrase level. Patient participated in a basic money counting task and  required total A for problem solving. Suspect patient's function impacted by fatigue and restlessness. Patient left semi-reclined in wheelchair with mitts and quick release belt in at RN station. Continue with current plan of care.   Session 2: Skilled treatment session focused on dysphagia and speech goals. SLP facilitated session by providing thorough oral care via the suction toothbrush and providing Min A verbal cues for patient to utilize the toothbrush effectively. Patient consumed trials of of ice chips with throat clear X 1 and trials of thin liquids via tsp and cup without overt s/s of aspiration, however, patient required Total A for portion control throughout trials due to impulsivity. Patient with decreased verbal expression this session, suspect due to fatigue. Patient also required Max A verbal cues for use of an increased vocal intensity at the word and phrase level. Patient handed off to PT. Continue with current plan of care.   Function:  Eating Eating   Modified Consistency Diet: Yes (with trials from SLP only ) Eating Assist Level: Supervision or verbal cues;Hand over hand assist;Help managing cup/glass;Helper feeds patient           Cognition Comprehension Comprehension assist level: Understands basic 25 - 49% of the time/ requires cueing 50 - 75% of the time  Expression   Expression assist level: Expresses basis less than 25% of the time/requires cueing >75% of the time.  Social Interaction Social Interaction assist level: Interacts appropriately 25 - 49% of time - Needs frequent redirection.  Problem Solving Problem solving assist level: Solves basic less than 25% of the time - needs direction nearly all the time or does not  effectively solve problems and may need a restraint for safety  Memory Memory assist level: Recognizes or recalls 25 - 49% of the time/requires cueing 50 - 75% of the time    Pain Pain Assessment Pain Assessment: 0-10 Pain Score: 5  (pt states  "better") Pain Type: Acute pain Pain Location: Neck Pain Orientation: Right;Left Pain Descriptors / Indicators: Aching Pain Frequency: Intermittent Pain Onset: Gradual Pain Intervention(s): Medication (See eMAR)  Therapy/Group: Individual Therapy  Shivaay Stormont 05/13/2016, 2:18 PM

## 2016-05-13 NOTE — Progress Notes (Signed)
Occupational Therapy Note  Patient Details  Name: LINO KLUTZ MRN: [id number] Date of Birth: [birth year]  Today's Date: 05/13/2016 OT Individual Time: 1130-1200 OT Individual Time Calculation (min): 30 min    Pt with no s/s of pain Individual Therapy  Pt engaged in table activities-arranging months in sequential order and arranging numbered discs (1-10) sequentially.  Pt was able to initiate all task with min verbal cues and complete initial steps of tasks before requring max verbal cues to complete tasks.  Pt requires max verbal cues for attention to task. Pt returned to RN station with QRB and bilateral mitts in place.   [name] [facility] 05/13/2016, 12:12 PM
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
## 2016-05-13 NOTE — Progress Notes (Signed)
Physical Therapy Session Note  Patient Details  Name: [name] MRN: [id number] Date of Birth: May 02, 1961  Today's Date: 05/13/2016 PT Individual Time: [id number] and 1500-1530 PT Individual Time Calculation (min): 55 min and 30 min   Short Term Goals: Week 2:  PT Short Term Goal 1 (Week 2): Pt will demonstrate sustained attention x 30 seconds with max cuing. PT Short Term Goal 2 (Week 2): Pt will initate rolling in bed with max cuing. PT Short Term Goal 3 (Week 2): Pt will roll L/R in bed with bed rails and max assist +1. PT Short Term Goal 4 (Week 2): Pt will initiate supine<>sit transfer.  Skilled Therapeutic Interventions/Progress Updates:    Treatment 1: Pt received in w/c & agreeable to tx; pt noted HA during session & RN made aware. Session focused on cognitive remediation & transfers. In quiet gym pt transferred w/c>mat table with sliding board & +2 assist for safety. Therapist provided total assist for holding BLE to maintain NWB BLE and rehab tech assisted pt with scooting across board. Pt able to initiate & partially complete movement across board with assistance to prevent sliding forward & cuing to complete transfer. While sitting on edge of mat pt participated in ball toss successfully counting out loud x 5 tosses with moderate cuing to attend to ask. Provided pt with choice of 2 to assemble pipe tree shapes but pt unable to attend to therapist's instructions to successfully complete task. Pt reported he was "burning up" & utilized cold wet cloth to wash face. Pt requested to lie down & transferred supine<>sit on mat table with mod assist and max cuing. Engaged pt in conversation with pt successfully recalling his favorite sport, that he is currently in the hospital, and his birthday. While sitting edge of mat pt required max cuing to not attempt to bear weight through BLE as pt became restless, stating "I've got to get out of here." Therapist educated pt multiple times during  session on injured lower extremities but pt without any safety awareness. Pt transferred mat>w/c with +2 total assist in same manner as noted above but with less initiation and movement on pt's part. Adjusted pt's c-collar for increased comfort and positioning. At end of session pt left sitting in TIS w/c at nurses station with QRB in [place] donned.   Treatment 2: Pt received in handoff from SLP & pt greatly fatigued. Pt noted his headache was not as bad as in previous PT session. Pt transferred w/c>bed with +2 total assist with minimal initiation of transfer from pt. Therapist & rehab tech provided max multimodal cuing for weight bearing precautions, technique, initiation, and sequencing but pt with poor ability to follow one step commands. Pt required +2 to transfer sit>supine but pt then able to pull to head of bed with BUE & bed in trendelenburg position. While in bed pt assembled place cards with months on it in calender order with 90% verbal accuracy but required max cuing to actually place cards in order. Pt able to place cards with days of week written on them in order with max cuing but less time to complete task. Pt left in bed with alarm set & BUE mittens donned; pt falling asleep.   Therapy Documentation Precautions:  Precautions Precautions: Fall, Cervical Precaution Comments: feeding tube, c collar at all times, left LE external fixator, right foot cast  Required Braces or Orthoses: Cervical Brace Cervical Brace: Hard collar, At all times Restrictions Weight Bearing Restrictions: Yes  RLE Weight Bearing: Non weight bearing LLE Weight Bearing: Non weight bearing  Pain: Pain Assessment Pain Assessment: 0-10 Pain Score: 5  Pain Type: Acute pain Pain Location: Head Pain Descriptors / Indicators: Headache Pain Frequency: Intermittent Pain Intervention(s): Medication (See eMAR)   See Function Navigator for Current Functional Status.   Therapy/Group: Individual  Therapy  [name] 05/13/2016, 2:47 PM

## 2016-05-14 ENCOUNTER — Inpatient Hospital Stay (HOSPITAL_COMMUNITY): Payer: Self-pay

## 2016-05-14 ENCOUNTER — Inpatient Hospital Stay (HOSPITAL_COMMUNITY): Payer: No Typology Code available for payment source | Admitting: Speech Pathology

## 2016-05-14 ENCOUNTER — Inpatient Hospital Stay (HOSPITAL_COMMUNITY): Payer: No Typology Code available for payment source | Admitting: Physical Therapy

## 2016-05-14 LAB — GLUCOSE, CAPILLARY
Glucose-Capillary: 124 mg/dL — ABNORMAL HIGH (ref 65–99)
Glucose-Capillary: 126 mg/dL — ABNORMAL HIGH (ref 65–99)
Glucose-Capillary: 148 mg/dL — ABNORMAL HIGH (ref 65–99)
Glucose-Capillary: 97 mg/dL (ref 65–99)
Glucose-Capillary: 98 mg/dL (ref 65–99)

## 2016-05-14 NOTE — Progress Notes (Signed)
Patient now taking off the red lid on his feeding and putting  it in his mouth. We continue to monitor.

## 2016-05-14 NOTE — Progress Notes (Signed)
Speech Language Pathology Daily Session Note  Patient Details  Name: [name] MRN: [id number] Date of Birth: 05-Aug-1960  Today's Date: 05/14/2016 SLP Individual Time: 1000-1057 SLP Individual Time Calculation (min): 57 min   Short Term Goals: Week 2: SLP Short Term Goal 1 (Week 2): Patient will verbalize at the word level in 25% of opportunities with Max A multimodal cues.  SLP Short Term Goal 2 (Week 2): Patient will vocalize in 25% of opportunities with Max A multimodal cues.  SLP Short Term Goal 3 (Week 2): Patient will demonstrate sustained attention to a functional task for 3 mins with Max A multimodal cues.  SLP Short Term Goal 4 (Week 2): Patient will consume trials of ice chips with minimal overt s/s of aspiration with Max A multimodal cues over 3 sessions to assess readiness for objective swallow study  SLP Short Term Goal 5 (Week 2): Patient will perform basic functional tasks (face washing, brushing teeth with suction toothbrush, apply chapstick, etc) with Max A multimodal cues.    Skilled Therapeutic Interventions:Skilled therapy intervention focused on cognitive and dysphagia goals. Patient consumed a total of 2 oz ice chips and 8 oz water with intermittent s/s of aspiration in the form of wet vocal quality and delayed throat-clearing. Patient given Max A verbal cues for throat clearing and multiple swallows to clear airway. Given a basic sorting activity, patient was 90% accurate when dividing money coins by denomination. Patient was able to perform simple addition during a money counting task given supervision verbal cues. As the money counting task increased in complexity, patient required Max A verbal and visual cues to complete the task. Throughout session, patient voiced at the phrase level in about 35% of opportunities given Mod A verbal cues for volume and initiation. At the end of session, patient engaged in conversation with the therapist on biographical topics, voicing  at the phrase level in 50% of opportunities given Min A verbal cues for volume. Patient was oriented to person, month, and situation, disoriented to place and date. Patient left upright in wheelchair at nurse's station with staff supervision. Recommend MBS tomorrow to obtain objective measure of current swallowing function. Continue current plan of care.    Function:  Eating Eating     Eating Assist Level: Helper scoops food on utensil;Helper brings food to mouth;Help managing cup/glass;Helper feeds patient     Therapist, music on Utensil: Occasionally Soil scientist to Mouth: Occasionally   Cognition Comprehension Comprehension assist level: Understands basic 25 - 49% of the time/ requires cueing 50 - 75% of the time  Expression   Expression assist level: Expresses basic 25 - 49% of the time/requires cueing 50 - 75% of the time. Uses single words/gestures.  Social Interaction Social Interaction assist level: Interacts appropriately 25 - 49% of time - Needs frequent redirection.  Problem Solving Problem solving assist level: Solves basic 25 - 49% of the time - needs direction more than half the time to initiate, plan or complete simple activities  Memory Memory assist level: Recognizes or recalls less than 25% of the time/requires cueing greater than 75% of the time    Pain Pain Assessment Pain Assessment: No/denies pain  Therapy/Group: Individual Therapy  Thornton Papas 05/14/2016, 12:44 PM

## 2016-05-14 NOTE — Progress Notes (Signed)
Occupational Therapy Note  Patient Details  Name: [name] MRN: [id number] Date of Birth: 21-Jul-1960  Today's Date: 05/14/2016 OT Individual Time: 1300-1355 OT Individual Time Calculation (min): 55 min    Pt with no s/s of pain Individual Therapy  Pt resting in bed upon arrival.  Pt performed supine>sit EOB with min A in preparation for sliding board transfer to w/c.  Pt performed sliding board transfer with tot A+2 and max verbal cues for safety awareness and sequencing.  Pt transitioned to table activites with focus on following one step commands, problem solving, spatial organization, attention to task, and task initiation.  Pt engaged in assembling pvc pipe structure X 2 with max verbal cues with preselected parts.  Pt required max verbal cues for redirection to task with more than a reasonable amount of time to complete task.  Pt continues to exhibit restless behaviors and acknowledges that he is frustrated with difficulty of tasks.  Pt remained in w/c at nursing station with QRB in place.  [name] [facility] 05/14/2016, 2:22 PM

## 2016-05-14 NOTE — Progress Notes (Signed)
Occupational Therapy Session Note  Patient Details  Name: [name] MRN: [id number] Date of Birth: Dec 24, 1960  Today's Date: 05/14/2016 OT Individual Time: 0900-1000 OT Individual Time Calculation (min): 60 min     Short Term Goals: Week 2:  OT Short Term Goal 1 (Week 2): Pt will initiate/perform anterior lean in w/c to facilitate UB dressing tasks with mod A OT Short Term Goal 2 (Week 2): Pt will follow one step commands during functional tasks with min verbal cues OT Short Term Goal 3 (Week 2): Pt will initiate threading BLE into pants with max A while seated EOB OT Short Term Goal 4 (Week 2): Pt will maintain sitting balance EOB with close supervison while completing UB bathing/dressing tasks  Skilled Therapeutic Interventions/Progress Updates:    Pt engaged in bed mobility, functional transfers, and UB bathing/dressing tasks seated EOB.  Pt requires max A for rolling side to side, min A for supine>sit EOB.  Pt maintains sitting balance EOB at supervision level to complete UB bathing/dressing tasks.  Pt requires tot A +2 (pt=25%) for sliding board transfer to w/c. Pt restless throughout session and required max verbal cues to initiate all tasks.  Pt remained in w/c at nursing station (QRB in place and bilateral mitts donned). Therapy Documentation Precautions:  Precautions Precautions: Fall, Cervical Precaution Comments: feeding tube, c collar at all times, left LE external fixator, right foot cast  Required Braces or Orthoses: Cervical Brace Cervical Brace: Hard collar, At all times Restrictions Weight Bearing Restrictions: Yes RLE Weight Bearing: Non weight bearing LLE Weight Bearing: Non weight bearing General:   Vital Signs:   Pain: Pain Assessment Pain Score: 5  ADL: ADL ADL Comments: see functional navigator Exercises:   Other Treatments:    See Function Navigator for Current Functional Status.   Therapy/Group: Individual Therapy  Leroy Libman 05/14/2016, 11:20 AM
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
## 2016-05-14 NOTE — Progress Notes (Signed)
Physical Therapy Session Note  Patient Details  Name: [name] MRN: [id number] Date of Birth: January 04, 1961  Today's Date: 05/14/2016 PT Individual Time: 1405-1430 and [id number] PT Individual Time Calculation (min): 25 min and 57 min    Short Term Goals: Week 2:  PT Short Term Goal 1 (Week 2): Pt will demonstrate sustained attention x 30 seconds with max cuing. PT Short Term Goal 2 (Week 2): Pt will initate rolling in bed with max cuing. PT Short Term Goal 3 (Week 2): Pt will roll L/R in bed with bed rails and max assist +1. PT Short Term Goal 4 (Week 2): Pt will initiate supine<>sit transfer.  Skilled Therapeutic Interventions/Progress Updates:    Treatment 1: Pt received in w/c & agreeable to tx, denying c/o pain. Session focused on cognitive remediation with task of sorting chips in 2 piles by color (red & black). Pt required max cuing to follow one step commands & only able to do so 50% of the time. Pt with maximum sustained attention 5-8 seconds during task. Pt reported being hot throughout session with pt even stating "I'm going to pass out". Provided pt with cold, wet washcloth and he was able to wash his face. Provided pt with fan and notified nursing of pt's complaints. At end of session pt left sitting in TIS w/c with BUE mittens donned, QRB in place & at nurses station.   Treatment 2: Pt received in w/c & agreeable to tx. Pt noted "my neck is killing me" & RN made aware and administered medication. Transferred pt TIS w/c<>manual w/c via MaxiMove +2 total assist. Pt required heavy tactile cuing for w/c propulsion but was able to propel 10 ft x 6 trials in hallway with BUE & max assist. Pt with L inattention during task & required max cuing to maintain BLE placement on leg rests. Once pt in TIS w/c, returned to room & transferred w/c>bed via slide board with +2 total assist. Provided pt with max multimodal cuing but pt did not initiate any movement and required assistance for NWB BLE. Pt  required +2 assist to transfer to supine with pt demonstrating fatigue at end of session. Pt able to identify family friend in room by name. At end of session pt left in bed with alarm set & wife present.   Therapy Documentation Precautions:  Precautions Precautions: Fall, Cervical Precaution Comments: feeding tube, c collar at all times, left LE external fixator, right foot cast  Required Braces or Orthoses: Cervical Brace Cervical Brace: Hard collar, At all times Restrictions Weight Bearing Restrictions: Yes RLE Weight Bearing: Non weight bearing LLE Weight Bearing: Non weight bearing   See Function Navigator for Current Functional Status.   Therapy/Group: Individual Therapy  [name] 05/14/2016, 2:54 PM

## 2016-05-14 NOTE — Plan of Care (Signed)
Problem: RH BOWEL ELIMINATION Goal: RH STG MANAGE BOWEL WITH ASSISTANCE STG Manage Bowel with max Assistance.   Outcome: Not Progressing Total Assist
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
## 2016-05-14 NOTE — Progress Notes (Signed)
Pt alert and orientated, nonverbal, but follows all commands. Incontinent of bowel and bladder.Last BM 05/14/16. Calls after, or in the middle,  of an incontinent episode. Poor safety awareness,  impulsive, but easily redirected. Soft mitten in place to prevent tampering and removal of peg. Wife questioning if  Power of Oneta Rack can be obtained . Explained that pt was not mental stable to legally provided Power of [place] authorization at this time, per [place], CSW. However, guardianship maybe a possibility. Wife to contact social worker in the a.m.to discuss the difference between POA and guardianship.

## 2016-05-15 ENCOUNTER — Inpatient Hospital Stay (HOSPITAL_COMMUNITY): Payer: No Typology Code available for payment source | Admitting: Speech Pathology

## 2016-05-15 ENCOUNTER — Inpatient Hospital Stay (HOSPITAL_COMMUNITY): Payer: No Typology Code available for payment source | Admitting: Occupational Therapy

## 2016-05-15 ENCOUNTER — Inpatient Hospital Stay (HOSPITAL_COMMUNITY): Payer: Self-pay

## 2016-05-15 ENCOUNTER — Inpatient Hospital Stay (HOSPITAL_COMMUNITY): Payer: No Typology Code available for payment source | Admitting: Physical Therapy

## 2016-05-15 ENCOUNTER — Inpatient Hospital Stay (HOSPITAL_COMMUNITY): Payer: No Typology Code available for payment source

## 2016-05-15 LAB — GLUCOSE, CAPILLARY
Glucose-Capillary: 107 mg/dL — ABNORMAL HIGH (ref 65–99)
Glucose-Capillary: 138 mg/dL — ABNORMAL HIGH (ref 65–99)
Glucose-Capillary: 92 mg/dL (ref 65–99)

## 2016-05-15 NOTE — Progress Notes (Signed)
Speech Language Pathology Note  Patient Details  Name: KEVAL WOLSKE MRN: [id number] Date of Birth: [birth year] Today's Date: 05/15/2016  MBSS complete. Full report located under chart review in imaging section. Of note, patient with intermittent throat clearing throughout MBS that was unrelated to patient's current swallowing function.   Weston Anna, [place], [place]     [place], [place] 05/15/2016, 2:09 PM

## 2016-05-15 NOTE — Progress Notes (Signed)
Speech Language Pathology Daily Session Note  Patient Details  Name: [name] MRN: [id number] Date of Birth: 05-Apr-1961  Today's Date: 05/15/2016 SLP Individual Time: 0902-0959 SLP Individual Time Calculation (min): 57 min   Short Term Goals: Week 2: SLP Short Term Goal 1 (Week 2): Patient will verbalize at the word level in 25% of opportunities with Max A multimodal cues.  SLP Short Term Goal 2 (Week 2): Patient will vocalize in 25% of opportunities with Max A multimodal cues.  SLP Short Term Goal 3 (Week 2): Patient will demonstrate sustained attention to a functional task for 3 mins with Max A multimodal cues.  SLP Short Term Goal 4 (Week 2): Patient will consume trials of ice chips with minimal overt s/s of aspiration with Max A multimodal cues over 3 sessions to assess readiness for objective swallow study  SLP Short Term Goal 5 (Week 2): Patient will perform basic functional tasks (face washing, brushing teeth with suction toothbrush, apply chapstick, etc) with Max A multimodal cues.   Skilled Therapeutic Interventions:Skilled therapy intervention focused on cognitive and dysphagia goals. Patient independently sustained attention for up to 1 minute during a basic card task, requiring Min A verbal cues for recall of game rules. Patient voiced a the word level in about 10% of given opportunities given Mod A verbal cues. Patient verbalized at word level at a whisper in 50% of given opportunities with Min A verbal cues. Patient self-fed 1 oz of ice chips and sips of water from a cup (about 3 oz), self-monitoring for portion control given supervision verbal cues by the clinician. Patient showed overt s/s of aspiration in the form of coughing x2 during water sips from a cup. Patient independently verbalized the need to go to the bathroom, and was continent of bowel movement using the bedpan. Patient left upright in bed with OT in room. Continue current plan of care.    Function:  Eating Eating     Eating Assist Level: Supervision or verbal cues;Set up assist for;Help managing cup/glass   Eating Set Up Assist For: Opening containers Helper Scoops Food on Utensil: Occasionally     Cognition Comprehension Comprehension assist level: Understands basic 25 - 49% of the time/ requires cueing 50 - 75% of the time  Expression   Expression assist level: Expresses basic 25 - 49% of the time/requires cueing 50 - 75% of the time. Uses single words/gestures.  Social Interaction Social Interaction assist level: Interacts appropriately 25 - 49% of time - Needs frequent redirection.  Problem Solving Problem solving assist level: Solves basic 25 - 49% of the time - needs direction more than half the time to initiate, plan or complete simple activities  Memory Memory assist level: Recognizes or recalls less than 25% of the time/requires cueing greater than 75% of the time    Pain Pain Assessment Pain Assessment: No/denies pain  Therapy/Group: Individual Therapy  Thornton Papas 05/15/2016, 1:43 PM

## 2016-05-15 NOTE — Progress Notes (Signed)
Speech Language Pathology Weekly Progress Note  Patient Details  Name: [name] MRN: [id number] Date of Birth: 1961-04-12  Beginning of progress report period: May 08, 2016 End of progress report period: May 15, 2016   Short Term Goals: Week 2: SLP Short Term Goal 1 (Week 2): Patient will verbalize at the word level in 25% of opportunities with Max A multimodal cues.  SLP Short Term Goal 1 - Progress (Week 2): Met SLP Short Term Goal 2 (Week 2): Patient will vocalize in 25% of opportunities with Max A multimodal cues.  SLP Short Term Goal 2 - Progress (Week 2): Not met SLP Short Term Goal 3 (Week 2): Patient will demonstrate sustained attention to a functional task for 3 mins with Max A multimodal cues.  SLP Short Term Goal 3 - Progress (Week 2): Not met SLP Short Term Goal 4 (Week 2): Patient will consume trials of ice chips with minimal overt s/s of aspiration with Max A multimodal cues over 3 sessions to assess readiness for objective swallow study  SLP Short Term Goal 4 - Progress (Week 2): Met SLP Short Term Goal 5 (Week 2): Patient will perform basic functional tasks (face washing, brushing teeth with suction toothbrush, apply chapstick, etc) with Max A multimodal cues.  SLP Short Term Goal 5 - Progress (Week 2): Met    New Short Term Goals: Week 1: SLP Short Term Goal 1 (Week 1): Patient will verbalize at the word level in 25% of opportunities with Max A multimodal cues.  SLP Short Term Goal 1 - Progress (Week 1): Progressing toward goal SLP Short Term Goal 2 (Week 1): Patient will vocalize in 25% of opportunities with Max A multimodal cues.  SLP Short Term Goal 2 - Progress (Week 1): Progressing toward goal SLP Short Term Goal 3 (Week 1): Patient will demonstrate sustained attention to a functional task for 60 seconds with Max A multimodal cues.  SLP Short Term Goal 3 - Progress (Week 1): Met SLP Short Term Goal 4 (Week 1): Patient will orient to time, place and  situation when given a written choice from a field of 2 with Max A multimodal cues.  SLP Short Term Goal 4 - Progress (Week 1): Met SLP Short Term Goal 5 (Week 1): Patient will perform basic functional tasks (face washing, brushing teeth with suction toothbrush, apply chapstick, etc) with Max A multimodal cues.  SLP Short Term Goal 5 - Progress (Week 1): Progressing toward goal SLP Short Term Goal 6 (Week 1): Patient will consume trials of ice chips with minimal overt s/s of aspiration with Max A multimodal cues over 3 sessions to assess readiness for objective swallow study  SLP Short Term Goal 6 - Progress (Week 1): Progressing toward goal  Weekly Progress Updates: Patient has made excellent gains and has met 3 of 5 STG's this reporting period due to improved cognitive-linguistic and swallowing function. 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 Patient demonstrates increased verbal initiation in regards to wants/needs and requires Max A verbal cues for use of an increased vocal intensity to maximize intelligibility. Patient had MBS today and initiated a diet of Dys. 1 textures with thin liquids. Patient would benefit from continued skilled SLP intervention to maximize his cognitive-linguistic and swallowing function and overall functional independence prior to discharge.    Intensity: Minumum  of 1-2 x/day, 30 to 90 minutes Frequency: 3 to 5 out of 7 days Duration/Length of Stay: 06/04/16 Treatment/Interventions: Cognitive remediation/compensation;Cueing hierarchy;Functional tasks;Patient/family education;Therapeutic Activities;Environmental controls;Dysphagia/aspiration precaution training;Internal/external aids;Speech/Language facilitation     Weston Anna, [place],  CCC-SLP [phone]   [name], [place] 05/15/2016, 2:25 PM

## 2016-05-15 NOTE — Progress Notes (Signed)
Occupational Therapy Session Note  Patient Details  Name: [name] MRN: [id number] Date of Birth: 28-Aug-1960  Today's Date: 05/15/2016 OT Individual Time: 1000-1100 OT Individual Time Calculation (min): 60 min     Short Term Goals: Week 2:  OT Short Term Goal 1 (Week 2): Pt will initiate/perform anterior lean in w/c to facilitate UB dressing tasks with mod A OT Short Term Goal 2 (Week 2): Pt will follow one step commands during functional tasks with min verbal cues OT Short Term Goal 3 (Week 2): Pt will initiate threading BLE into pants with max A while seated EOB OT Short Term Goal 4 (Week 2): Pt will maintain sitting balance EOB with close supervison while completing UB bathing/dressing tasks  Skilled Therapeutic Interventions/Progress Updates:    Pt resting in bed upon arrival with hospital gown on.  Initial focus of session on bed mobility to facilitate LB dressing tasks and sitting EOB to don pullover shirt.  Pt required mod A for rolling in bed to don brief and pants before sitting EOB for UB dressing.  Pt completed supine>sit EOB with min A.  Pt donned shirt with min A.  Pt completed sliding board transfer with mod A +2.  Pt transitioned to peg board tasks attempting to replicate patterns from pictures.  Pt required max verbal cues to correctly complete tasks.  Pt identifies colors of pegs correctly and is able to place all of colored pegs separately in row correctly.  Pt is unable to place alternate red-blue pegs on board in correct sequence.  Focus on task initiation, sequencing, attention to task, and following one step commands.  Pt remained in w/c at nurses station with [place] in place and bilateral mitts donned.  Therapy Documentation Precautions:  Precautions Precautions: Fall, Cervical Precaution Comments: feeding tube, c collar at all times, left LE external fixator, right foot cast  Required Braces or Orthoses: Cervical Brace Cervical Brace: Hard collar, At all  times Restrictions Weight Bearing Restrictions: Yes RLE Weight Bearing: Non weight bearing LLE Weight Bearing: Non weight bearing General:   Vital Signs:   Pain: Pain Assessment Pain Assessment: Faces Faces Pain Scale: Hurts little more Pain Type: Acute pain Pain Location: Neck Pain Descriptors / Indicators: Aching;Grimacing Pain Onset: With Activity Pain Intervention(s): Repositioned;Rest ADL: ADL ADL Comments: see functional navigator Exercises:   Other Treatments:    See Function Navigator for Current Functional Status.   Therapy/Group: Individual Therapy  Leroy Libman 05/15/2016, 12:23 PM

## 2016-05-15 NOTE — Progress Notes (Signed)
Occupational Therapy Session Note  Patient Details  Name: [name] MRN: [id number] Date of Birth: 08-19-1960  Today's Date: 05/15/2016 OT Individual Time: [id number] OT Individual Time Calculation (min): 16 min  and Today's Date: 05/15/2016 OT Missed Time: 14 Minutes Missed Time Reason: Patient fatigue     Short Term Goals: Week 2:  OT Short Term Goal 1 (Week 2): Pt will initiate/perform anterior lean in w/c to facilitate UB dressing tasks with mod A OT Short Term Goal 2 (Week 2): Pt will follow one step commands during functional tasks with min verbal cues OT Short Term Goal 3 (Week 2): Pt will initiate threading BLE into pants with max A while seated EOB OT Short Term Goal 4 (Week 2): Pt will maintain sitting balance EOB with close supervison while completing UB bathing/dressing tasks  Skilled Therapeutic Interventions/Progress Updates:    Pt received at RN station and having difficulty staying awake. Pt with no c/o,signs, or symptoms of pain. Pt requesting to return to bed and having difficulty attending to tasks as he is unable to stay awake. OT tranfers pt to bed from wheelchair with +2 assist and use of slide board. Pt needing manual facilitation and multimodal cues for task. Pt did initiate transfer as he was motivated to return to bed. Pt needing assistance to place L LE onto bed from sit >supine. Bed alarm activated and pt demonstrating ability to use soft touch call bell. All needed items within reach.   Therapy Documentation Precautions:  Precautions Precautions: Fall, Cervical Precaution Comments: feeding tube, c collar at all times, left LE external fixator, right foot cast  Required Braces or Orthoses: Cervical Brace Cervical Brace: Hard collar, At all times Restrictions Weight Bearing Restrictions: Yes RLE Weight Bearing: Non weight bearing LLE Weight Bearing: Non weight bearing General: General OT Amount of Missed Time: 14 Minutes Vital Signs:   Pain:    ADL: ADL ADL Comments: see functional navigator Exercises:   Other Treatments:    See Function Navigator for Current Functional Status.   Therapy/Group: Individual Therapy  Phineas Semen 05/15/2016, 8:17 PM

## 2016-05-15 NOTE — Progress Notes (Signed)
Occupational Therapy Note  Patient Details  Name: [name] MRN: [id number] Date of Birth: 1961/03/29  Today's Date: 05/15/2016 OT Individual Time: 1400-1430 OT Individual Time Calculation (min): 30 min    Pt denied pain/no s/s of pain Individual Therapy  Pt resting in bed upon arrival.  Pt completed supine>sit EOB with min A and maintained sitting balance with supervision in preparation for sliding board transfer to w/c.  Pt performed lateral lean with in A to facilitate placement of sliding board.  Pt required tot A +2 (pt=50%) for transfer.  Pt transitioned to table activities including engaging in game of Connect 4.  Pt required max verbal cues for strategy and attending to task to place tokens appropriately.  Pt's attention decreased during the session and pt agreed that his "brain was getting tired." Pt returned to nursing station with [place] in place and bilateral mits donned.    [name] [facility] 05/15/2016, 2:45 PM

## 2016-05-15 NOTE — Progress Notes (Signed)
Physical Therapy Session Note  Patient Details  Name: [name] MRN: [id number] Date of Birth: Nov 23, 1960  Today's Date: 05/15/2016 PT Individual Time: 1100-1153 PT Individual Time Calculation (min): 53 min    Short Term Goals: Week 2:  PT Short Term Goal 1 (Week 2): Pt will demonstrate sustained attention x 30 seconds with max cuing. PT Short Term Goal 2 (Week 2): Pt will initate rolling in bed with max cuing. PT Short Term Goal 3 (Week 2): Pt will roll L/R in bed with bed rails and max assist +1. PT Short Term Goal 4 (Week 2): Pt will initiate supine<>sit transfer.  Skilled Therapeutic Interventions/Progress Updates:    Patient in [place] wheelchair at RN station. Session focused on initiation, sustained attention, L attention, and command following. Engaged in [place] mode A from wheelchair level using lower quadrants only x 3 trials with mod faded to min verbal cues for attention to task, attention to L side of board, and to utilize both LUE and RUE. Patient demonstrated improvement from 18 to 24 hits between first and second trials and decreased on last trial to 19 hits due to increased restlessness and c/o being hot. Patient provided with cold wash cloth and transitioned to quiet ADL apartment. Patient engaged in tabletop activities of simple sorting task with increased time and no cues and game of Connect 4 with max verbal cues for turn taking and mod faded to min cues for game strategy. Patient attended to 80% of game without cues before becoming restless. Patient performed slide board transfer back to bed with max A x 2 and left semi reclined in bed with 3 rails up, bed alarm on, B mitts donned, and soft call bell within reach.   Therapy Documentation Precautions:  Precautions Precautions: Fall, Cervical Precaution Comments: feeding tube, c collar at all times, left LE external fixator, right foot cast  Required Braces or Orthoses: Cervical Brace Cervical Brace: Hard collar, At  all times Restrictions Weight Bearing Restrictions: Yes RLE Weight Bearing: Non weight bearing LLE Weight Bearing: Non weight bearing Pain: Pain Assessment Pain Assessment: Faces Faces Pain Scale: Hurts little more Pain Type: Acute pain Pain Location: Neck Pain Descriptors / Indicators: Aching;Grimacing Pain Onset: With Activity Pain Intervention(s): Repositioned;Rest   See Function Navigator for Current Functional Status.   Therapy/Group: Individual Therapy  Laretta Alstrom 05/15/2016, 12:12 PM
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
## 2016-05-16 ENCOUNTER — Inpatient Hospital Stay (HOSPITAL_COMMUNITY): Payer: Self-pay

## 2016-05-16 ENCOUNTER — Inpatient Hospital Stay (HOSPITAL_COMMUNITY): Payer: No Typology Code available for payment source | Admitting: Speech Pathology

## 2016-05-16 ENCOUNTER — Inpatient Hospital Stay (HOSPITAL_COMMUNITY): Payer: Self-pay | Admitting: Physical Therapy

## 2016-05-16 LAB — BASIC METABOLIC PANEL
Anion gap: 7 (ref 5–15)
BUN: 13 mg/dL (ref 6–20)
CO2: 28 mmol/L (ref 22–32)
Calcium: 9.3 mg/dL (ref 8.9–10.3)
Chloride: 101 mmol/L (ref 101–111)
Creatinine, Ser: 0.53 mg/dL — ABNORMAL LOW (ref 0.61–1.24)
GFR calc Af Amer: >60 mL/min (ref >60)
GFR calc non Af Amer: >60 mL/min (ref >60)
Glucose, Bld: 116 mg/dL — ABNORMAL HIGH (ref 65–99)
Potassium: 3.9 mmol/L (ref 3.5–5.1)
Sodium: 136 mmol/L (ref 135–145)

## 2016-05-16 LAB — CBC
HCT: 40 % (ref 39.0–52.0)
Hemoglobin: 12.3 g/dL — ABNORMAL LOW (ref 13.0–17.0)
MCH: 27.8 pg (ref 26.0–34.0)
MCHC: 30.8 g/dL (ref 30.0–36.0)
MCV: 90.3 fL (ref 78.0–100.0)
Platelets: 179 10*3/uL (ref 150–400)
RBC: 4.43 MIL/uL (ref 4.22–5.81)
RDW: 14.3 % (ref 11.5–15.5)
WBC: 9.5 10*3/uL (ref 4.0–10.5)

## 2016-05-16 LAB — GLUCOSE, CAPILLARY
Glucose-Capillary: 114 mg/dL — ABNORMAL HIGH (ref 65–99)
Glucose-Capillary: 114 mg/dL — ABNORMAL HIGH (ref 65–99)
Glucose-Capillary: 119 mg/dL — ABNORMAL HIGH (ref 65–99)
Glucose-Capillary: 130 mg/dL — ABNORMAL HIGH (ref 65–99)
Glucose-Capillary: 77 mg/dL (ref 65–99)
Glucose-Capillary: 91 mg/dL (ref 65–99)

## 2016-05-16 MED ORDER — FREE WATER
100.0000 mL | Freq: Three times a day (TID) | Status: DC
  Administered 2016-05-16 – 2016-05-26 (×27): 100 mL

## 2016-05-16 MED ORDER — PRO-STAT SUGAR FREE PO LIQD
30.0000 mL | Freq: Two times a day (BID) | ORAL | Status: DC
  Administered 2016-05-17 – 2016-05-26 (×19): 30 mL
  Filled 2016-05-16 (×21): qty 30
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
## 2016-05-16 NOTE — Progress Notes (Signed)
Occupational Therapy Weekly Progress Note  Patient Details  Name: [name] MRN: [id number] Date of Birth: 06-Dec-1960  Beginning of progress report period: May 08, 2016 End of progress report period: May 16, 2016  Patient has met 3 of 4 short term goals.  Pt made steady progress during this past week.  Pt is able to perform supine>sit EOB with min A and maintain sitting balance EOB to perform UB bathing/dressing tasks for approx 3 mins before c/o increased pain in neck.  Pt follows one step commands and is able to attend to tasks for approx 15 seconds with max verbal cues to redirect.  Pt continues to exhibit restless behaviors and becomes easily frustrated with simple table tasks (peg board, Connect 4, pipe tree tasks). Pt exhibits behaviors consistent with [place]o Level V/emerging VI.  Patient continues to demonstrate the following deficits: decreased I in self care, balance, functional transfers, functional mobility, initiation, cognition, attention, sequencing, safety awareness, and difficulty following weight bearing precautions and therefore will continue to benefit from skilled OT intervention to enhance overall performance with BADL.  Patient progressing toward long term goals..  Continue plan of care.  OT Short Term Goals Week 2:  OT Short Term Goal 1 (Week 2): Pt will initiate/perform anterior lean in w/c to facilitate UB dressing tasks with mod A OT Short Term Goal 1 - Progress (Week 2): Met OT Short Term Goal 2 (Week 2): Pt will follow one step commands during functional tasks with min verbal cues OT Short Term Goal 2 - Progress (Week 2): Met OT Short Term Goal 3 (Week 2): Pt will initiate threading BLE into pants with max A while seated EOB OT Short Term Goal 3 - Progress (Week 2): Progressing toward goal OT Short Term Goal 4 (Week 2): Pt will maintain sitting balance EOB with close supervison while completing UB bathing/dressing tasks OT Short Term Goal 4 - Progress  (Week 2): Met Week 3:  OT Short Term Goal 1 (Week 3): Pt will initiate threading BLE into pants with max A while seated EOB OT Short Term Goal 2 (Week 3): Pt will attend to functional task for 60 seconds with min verbal cues OT Short Term Goal 3 (Week 3): Pt will maintain unsupported sitting balance with supervision for 5 mins to complete functional task OT Short Term Goal 4 (Week 3): Pt will perform lateral lean in preparation for pulling up pants and/or performing toilet hygiene   Skilled Therapeutic Interventions/Progress Updates:     Therapy Documentation Precautions:  Precautions Precautions: Fall, Cervical Precaution Comments: feeding tube, c collar at all times, left LE external fixator, right foot cast  Required Braces or Orthoses: Cervical Brace Cervical Brace: Hard collar, At all times Restrictions Weight Bearing Restrictions: Yes RLE Weight Bearing: Non weight bearing LLE Weight Bearing: Non weight bearing  See Function Navigator for Current Functional Status.   Therapy/Group:   Leroy Libman 05/16/2016, 6:49 AM
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
## 2016-05-16 NOTE — Progress Notes (Signed)
Physical Therapy Session Note  Patient Details  Name: [name] MRN: [id number] Date of Birth: 1960/11/17  Today's Date: 05/16/2016 PT Individual Time: 1300-1400 PT Individual Time Calculation (min): 60 min    Short Term Goals: Week 3:  PT Short Term Goal 1 (Week 3): Pt will perform bed <> chair transfer with max A PT Short Term Goal 2 (Week 3): Pt will attend to functional task x 30 seconds with min A PT Short Term Goal 3 (Week 3): Pt will roll in bed with use of bed rails and mod A  Skilled Therapeutic Interventions/Progress Updates:  Patient resting in bed upon arrival with B mitts removed. Session focused on initiation, sustained attention, L attention, and safety awareness with mobility tasks. Patient requesting to urinate, sat EOB with use of rail with min A to use urinal with increased time and total cues to prevent patient from attempting to stand on BLE due to NWB status. Patient returned to supine with min A to prevent WB through BLE and performed rolling to R and L with min-max A to place sling for maximove transfer to manual wheelchair with total A (no +2 available). Retrieved elevating leg rests for manual wheelchair. Patient instructed in wheelchair propulsion using BUE with max-total cues and hand over hand assist to place LUE on wheelchair rim initially and after each rest break in mildly distracting hallway for total of > 200 ft with 3 rest breaks due to c/o being hot with close supervision-mod A overall for steering and total A to prevent patient from running into obstacles on L. Patient transferred from manual wheelchair to [facility] wheelchair with total A via maximove and left at RN station with quick release belt donned and B mitts donned.   Therapy Documentation Precautions:  Precautions Precautions: Fall, Cervical Precaution Comments: feeding tube, c collar at all times, left LE external fixator, right foot cast  Required Braces or Orthoses: Cervical Brace Cervical Brace:  Hard collar, At all times Restrictions Weight Bearing Restrictions: Yes RLE Weight Bearing: Non weight bearing LLE Weight Bearing: Non weight bearing Pain: Pain Assessment Pain Assessment: No/denies pain  See Function Navigator for Current Functional Status.   Therapy/Group: Individual Therapy  Laretta Alstrom 05/16/2016, 3:29 PM
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
## 2016-05-16 NOTE — Progress Notes (Signed)
Physical Therapy Weekly Progress Note  Patient Details  Name: [name] MRN: [id number] Date of Birth: 1960-07-30  Beginning of progress report period: May 08, 2016 End of progress report period: May 16, 2016  Patient has met 4 of 4 short term goals.  Pt with improving initiation and attention, able to sustain attention with cuing and initiate functional mobility tasks. Pt continues to require +2 total assist for transfers due to NWB status and continued motor apraxia and therefore will continue to benefit from skilled PT intervention to enhance overall performance with activity tolerance, balance, postural control, ability to compensate for deficits, attention, awareness, coordination and knowledge of precautions.  Patient progressing toward long term goals..  Continue plan of care.  PT Short Term Goals Week 2:  PT Short Term Goal 1 (Week 2): Pt will demonstrate sustained attention x 30 seconds with max cuing. PT Short Term Goal 1 - Progress (Week 2): Met PT Short Term Goal 2 (Week 2): Pt will initate rolling in bed with max cuing. PT Short Term Goal 2 - Progress (Week 2): Met PT Short Term Goal 3 (Week 2): Pt will roll L/R in bed with bed rails and max assist +1. PT Short Term Goal 3 - Progress (Week 2): Met PT Short Term Goal 4 (Week 2): Pt will initiate supine<>sit transfer. PT Short Term Goal 4 - Progress (Week 2): Met Week 3:  PT Short Term Goal 1 (Week 3): Pt will perform bed <> chair transfer with max A PT Short Term Goal 2 (Week 3): Pt will attend to functional task x 30 seconds with min A PT Short Term Goal 3 (Week 3): Pt will roll in bed with use of bed rails and mod A   Skilled Therapeutic Interventions/Progress Updates: DME/adaptive equipment instruction;Neuromuscular re-education;Psychosocial support;UE/LE Strength taining/ROM;Wheelchair propulsion/positioning;UE/LE Coordination activities;Therapeutic Activities;Skin care/wound management;Pain management;Discharge  planning;Balance/vestibular training;Cognitive remediation/compensation;Disease management/prevention;Functional mobility training;Patient/family education;Therapeutic Exercise;Visual/perceptual remediation/compensation     See Function Navigator for Current Functional Status.   Dessirae Scarola 05/16/2016, 6:53 AM

## 2016-05-16 NOTE — Progress Notes (Signed)
Occupational Therapy Session Note  Patient Details  Name: [name] MRN: [id number] Date of Birth: January 07, 1961  Today's Date: 05/16/2016 OT Individual Time: 0900-1000 OT Individual Time Calculation (min): 60 min     Short Term Goals: Week 3:  OT Short Term Goal 1 (Week 3): Pt will initiate threading BLE into pants with max A while seated EOB OT Short Term Goal 2 (Week 3): Pt will attend to functional task for 60 seconds with min verbal cues OT Short Term Goal 3 (Week 3): Pt will maintain unsupported sitting balance with supervision for 5 mins to complete functional task OT Short Term Goal 4 (Week 3): Pt will perform lateral lean in preparation for pulling up pants and/or performing toilet hygiene  Skilled Therapeutic Interventions/Progress Updates:    Pt resting in bed upon arrival with RN present.  Pt initiated rolling in bed for RN to apply lotion to buttocks.  Pt lifted his RLE to facilitate threading of shorts.  Pt initiated pulling his shorts over his hips and required min A to complete task.  Pt performed supine>sit EOB with min A. Pt maintained sitting balance at EOB to don his shirt prior to sliding board transfer to w/c.  Pt required tot A +2 (for safety) for sliding board transfer (pt=50%). Pt transitioned to [employer] and engaged in game of War with playing cards.  Pt required min verbal cues throughout game to initiate taking turns and determining winner of each hand.  Pt's attention deteriorated throughout and required max verbal cues to attend at the end of the game.  Focus on bed mobility, functional transfers, sitting balance, cognitive remediation, task initiation, attention to task, sequencing, and safety awareness to increase independence with BADLs.   Therapy Documentation Precautions:  Precautions Precautions: Fall, Cervical Precaution Comments: feeding tube, c collar at all times, left LE external fixator, right foot cast  Required Braces or Orthoses: Cervical  Brace Cervical Brace: Hard collar, At all times Restrictions Weight Bearing Restrictions: Yes RLE Weight Bearing: Non weight bearing LLE Weight Bearing: Non weight bearing Pain: Pt c/o discomfort wearing cervical collar; readjusted and repositioned See Function Navigator for Current Functional Status.   Therapy/Group: Individual Therapy  Leroy Libman 05/16/2016, 10:06 AM

## 2016-05-16 NOTE — Progress Notes (Signed)
Speech Language Pathology Daily Session Notes  Patient Details  Name: [name] MRN: [id number] Date of Birth: [birth year]  Today's Date: 05/16/2016  Session 1:SLP Individual Time: 1100-1200 SLP Individual Time Calculation (min): 60 min   Session 2:SLP Individual Time: 1415-1500 SLP Individual Time Calculation (min): 45 min   Short Term Goals: Week 3: SLP Short Term Goal 1 (Week 3): Patient will verbalize at the word level in 75% of opportunities with Mod A multimodal cues.  SLP Short Term Goal 2 (Week 3): Patient will vocalize in 25% of opportunities with Max A multimodal cues.  SLP Short Term Goal 3 (Week 3): Patient will demonstrate sustained attention to a functional task for 3 mins with Max A multimodal cues.  SLP Short Term Goal 4 (Week 3): Patient will perform basic functional tasks (face washing, brushing teeth with suction toothbrush, apply chapstick, etc) with Mod A multimodal cues.  SLP Short Term Goal 5 (Week 3): Patient will consume current diet with minimal overt s/s of aspiration with Mod A verbal cues for use of swallowing compenstory strategies.  SLP Short Term Goal 6 (Week 3): Patient will demonstrate efficient mastication of Dys. 2 trials with minimal oral residue without overt s/s of aspiration with Mod A verbal cues over 3 consecutive sessions prior to upgrade.   Skilled Therapeutic Interventions:  Session 1: Skilled treatment session focused on speech and dysphagia goals. SLP facilitated session by providing Max A verbal cues for use of an increased vocal intensity at the word and phrase level to achieve ~75% intelligibility during a verbal description task. Patient completed verbal descriptions with 100% accuracy with Min A question cues. Patient consumed lunch meal of Dys. 1 textures with thin liquids via cup with an intermittent wet vocal quality that cleared with cued throat clears. Patient required Mod A verbal cues for use of swallowing compensatory strategies.  Recommend patient continue current diet.  Patient transferred back to with +2 assist via slide board. Patient left with all needs within reach and mitts/bed alarm on. Continue with current plan of care.   Session 2: Skilled treatment session focused on cognitive goals. SLP facilitated session by administering the [facility] Cognitive Assessment (MoCA, Version 7.3). Patient scored 19/30 points with a score of 26 or above considered normal with deficits in the area of visual-spatial skills, executive functioning, attention, orientation and abstract reasoning. Patient required Mod A verbal cues for use of an increased vocal intensity at the word and phrase level and participated in task for ~20 minutes with Min A verbal cues for redirection. Patient transferred back to bed at end of session via the [facility] with +2 assist. Patient left with all needs within reach and mitts/bed alarm on. Continue with current plan of care.    Function:  Eating Eating   Modified Consistency Diet: Yes Eating Assist Level: More than reasonable amount of time;Supervision or verbal cues   Eating Set Up Assist For: Opening containers       Cognition Comprehension Comprehension assist level: Understands basic 50 - 74% of the time/ requires cueing 25 - 49% of the time  Expression   Expression assist level: Expresses basic 25 - 49% of the time/requires cueing 50 - 75% of the time. Uses single words/gestures.  Social Interaction Social Interaction assist level: Interacts appropriately 25 - 49% of time - Needs frequent redirection.  Problem Solving Problem solving assist level: Solves basic 25 - 49% of the time - needs direction more than half the time to initiate, plan  or complete simple activities  Memory Memory assist level: Recognizes or recalls less than 25% of the time/requires cueing greater than 75% of the time    Pain No/Denies Pain   Therapy/Group: Individual Therapy  Sheron Tallman 05/16/2016, 3:07 PM

## 2016-05-16 NOTE — Progress Notes (Addendum)
Social Work Patient ID: [name], male   DOB: 09/14/1960, 55 y.o.   MRN: [id number]   CSW spoke with pt's wife to update her on team conference discussion and targeted d/c date of 06-04-16.  She was pleased with this date.  CSW spoke briefly with pt, as well.  CSW also provided her with some information on pursuing guardianship, as pt's wife was hoping pt could complete POA paperwork, however Dr. Naaman Plummer feels pt cannot make his own decisions at this time.  CSW researched guardianship process for pt and left her information in pt's room.  Team will hold a family conference with pt's wife and any other involved family next week.  CSW will continue to follow and assist as needed.

## 2016-05-16 NOTE — Progress Notes (Signed)
Physical Therapy Note  Patient Details  Name: [name] MRN: [id number] Date of Birth: 1961/06/05 Today's Date: 05/16/2016    Time: 1000-1057 57 minutes  1:1 No c/o pain. Pt c/o being hot throughout session, felt better with cool cloth and fan.  Sliding board transfer training w/c <> mat and w/c <> bed in ADL apartment with +2 for safety, mod A with pt able to scoot and use UEs well, pt requires assist to prevent wt bearing on bilat LEs.  Seated balance with card matching task with reaching out of BOS with supervision, able to correctly match cards 90% of the time.  Pipe tree activity seated with pt able to build easy patterns with 100% accuracy.  On hard pictures pt requires mod cuing to build correctly. Pt improving attention and able to attend x 10 minutes in a moderately distracting environment with min cues.   Sten Dematteo 05/16/2016, 10:58 AM

## 2016-05-17 ENCOUNTER — Inpatient Hospital Stay (HOSPITAL_COMMUNITY): Payer: No Typology Code available for payment source

## 2016-05-17 ENCOUNTER — Inpatient Hospital Stay (HOSPITAL_COMMUNITY): Payer: Self-pay

## 2016-05-17 ENCOUNTER — Inpatient Hospital Stay (HOSPITAL_COMMUNITY): Payer: No Typology Code available for payment source | Admitting: Speech Pathology

## 2016-05-17 ENCOUNTER — Inpatient Hospital Stay (HOSPITAL_COMMUNITY): Payer: Self-pay | Admitting: Physical Therapy

## 2016-05-17 LAB — GLUCOSE, CAPILLARY
Glucose-Capillary: 123 mg/dL — ABNORMAL HIGH (ref 65–99)
Glucose-Capillary: 128 mg/dL — ABNORMAL HIGH (ref 65–99)
Glucose-Capillary: 129 mg/dL — ABNORMAL HIGH (ref 65–99)
Glucose-Capillary: 95 mg/dL (ref 65–99)
Glucose-Capillary: 99 mg/dL (ref 65–99)

## 2016-05-17 NOTE — Progress Notes (Signed)
Occupational Therapy Note  Patient Details  Name: KEEDEN LANGENBACH MRN: [id number] Date of Birth: Nov 22, 1960  Today's Date: 05/17/2016 OT Individual Time: 1300-1400 OT Individual Time Calculation (min): 60 min    Pt c/o increased neck and back pain; RN aware and meds admins Individual Therapy  Pt resting in bed upon arrival and requested to use the urinal.  Pt requires tot A to use urinal at bed level.  Pt sat EOB with supervision and performed sliding board transfer with tot A +2 (pt=75%). Pt initially engaged in sorting nuts and bolts and placing appropriate nuts on matching bolts.  Pt initially completed task with supervision but required increased assistance with mental fatigue.  Pt transitioned to replicating patterns with colored pegs on peg board with focus on attention to task and problem solving.  Adjustments made to head rest on w/c and PA consulted regarding possible torticollis.   [name] [facility] 05/17/2016, 3:12 PM

## 2016-05-17 NOTE — Progress Notes (Signed)
Physical Therapy Session Note  Patient Details  Name: [name] MRN: [id number] Date of Birth: 07-08-1961  Today's Date: 05/17/2016 PT Individual Time: [id number] PT Individual Time Calculation (min): 66 min    Short Term Goals: Week 3:  PT Short Term Goal 1 (Week 3): Pt will perform bed <> chair transfer with max A PT Short Term Goal 2 (Week 3): Pt will attend to functional task x 30 seconds with min A PT Short Term Goal 3 (Week 3): Pt will roll in bed with use of bed rails and mod A  Skilled Therapeutic Interventions/Progress Updates:  Pt received in w/c at RN station; pt reporting fatigue but willing to participate.  Pt transported to gym.  Demonstrated to pt how to perform anterior/posterior transfer w/c <> mat to increase independence with transfers without slideboard and without holding each LE to prevent WB.  Attempted to perform anterior transfer from w/c > mat with +2 to maintain NWB LLE and RLE but due to hamstring tightness and low back pain pt requested to stop.  Performed slideboard transfer w/c > mat with +2 with one therapist keeping LE elevated off floor and second therapist controlling pelvic position-pt performed 75% of scoot across board.  On mat pt performed supine <> sit and rolling to L and R in supine to assess vestibular system; pt reporting room spinning vertigo with sit <> supine and rolling to L side.  With rolling to L and supine <> sit, L torsional nystagmus noted, low amplitude, long duration.  Will attempt to perform more formal assessment and treatment with use of hospital bed next week.  Pt returned to sitting and engaged in cognitive task of hangman game to facilitate attention and verbalization of letters and words.  Returned to w/c with slideboard and +2; in room pt performed slideboard w/c > bed with +2 and tactile cues to maintain NWB and forward lean.  Performed sit > supine with min-mod A; pt positioned with HOB elevated and LE elevated on pillows.  Pt left  with mitts donned and all items within reach.  Therapy Documentation Precautions:  Precautions Precautions: Fall, Cervical Precaution Comments: feeding tube, c collar at all times, left LE external fixator, right foot cast  Required Braces or Orthoses: Cervical Brace Cervical Brace: Hard collar, At all times Restrictions Weight Bearing Restrictions: Yes RLE Weight Bearing: Non weight bearing LLE Weight Bearing: Non weight bearing Vital Signs: Therapy Vitals Temp: 97.3 F (36.3 C) Temp Source: Oral Pulse Rate: 70 Resp: 19 BP: 118/77 Patient Position (if appropriate): Lying Oxygen Therapy SpO2: 97 % O2 Device: Not Delivered  See Function Navigator for Current Functional Status.   Therapy/Group: Individual Therapy  Raylene Everts [facility] 05/17/2016, 3:55 PM
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
## 2016-05-17 NOTE — Progress Notes (Signed)
Occupational Therapy Session Note  Patient Details  Name: [name] MRN: [id number] Date of Birth: October 09, 1960  Today's Date: 05/17/2016 OT Individual Time: 0900-1000 OT Individual Time Calculation (min): 60 min     Short Term Goals: Week 3:  OT Short Term Goal 1 (Week 3): Pt will initiate threading BLE into pants with max A while seated EOB OT Short Term Goal 2 (Week 3): Pt will attend to functional task for 60 seconds with min verbal cues OT Short Term Goal 3 (Week 3): Pt will maintain unsupported sitting balance with supervision for 5 mins to complete functional task OT Short Term Goal 4 (Week 3): Pt will perform lateral lean in preparation for pulling up pants and/or performing toilet hygiene  Skilled Therapeutic Interventions/Progress Updates:    Pt engaged in LB bathing and dressing tasks at bed level with pt assisting threading and pulling up his shorts.  Pt rolls R<>L with min A to facilitate applying brief and pulling shorts over hips.  Pt performs supine>sit EOB with supervision (HOB elevated and using bedrails). Pt performs sliding board transfer with tot A +2 (pt=75%).  Pt completed grooming tasks w/c level at sink.  Pt engaged in table activities with peg board, replicating simple pattern with colored pegs. Pt completed simple alternating color pattern independently.  Pt required max verbal cues to complete more complex pattern.  Pt continues to required increased verbal cues for simple tasks during later portions of sessions.  Pt remained in w/c with QRB in place.  Bilateral mitts are not placed while sitting at RN station.  Therapy Documentation Precautions:  Precautions Precautions: Fall, Cervical Precaution Comments: feeding tube, c collar at all times, left LE external fixator, right foot cast  Required Braces or Orthoses: Cervical Brace Cervical Brace: Hard collar, At all times Restrictions Weight Bearing Restrictions: Yes RLE Weight Bearing: Non weight bearing LLE  Weight Bearing: Non weight bearing General:   Vital Signs: Therapy Vitals Pulse Rate: 87 BP: 112/64 Pain: Pain Assessment Pain Assessment: No/denies pain Pain Score: 8  ADL: ADL ADL Comments: see functional navigator Exercises:   Other Treatments:    See Function Navigator for Current Functional Status.   Therapy/Group: Individual Therapy  Leroy Libman 05/17/2016, 12:19 PM

## 2016-05-17 NOTE — Progress Notes (Signed)
Speech Language Pathology Daily Session Note  Patient Details  Name: [name] MRN: [id number] Date of Birth: 1960-08-18  Today's Date: 05/17/2016 SLP Individual Time: 1002-1100 SLP Individual Time Calculation (min): 58 min   Short Term Goals: Week 3: SLP Short Term Goal 1 (Week 3): Patient will verbalize at the word level in 75% of opportunities with Mod A multimodal cues.  SLP Short Term Goal 2 (Week 3): Patient will vocalize in 25% of opportunities with Max A multimodal cues.  SLP Short Term Goal 3 (Week 3): Patient will demonstrate sustained attention to a functional task for 3 mins with Max A multimodal cues.  SLP Short Term Goal 4 (Week 3): Patient will perform basic functional tasks (face washing, brushing teeth with suction toothbrush, apply chapstick, etc) with Mod A multimodal cues.  SLP Short Term Goal 5 (Week 3): Patient will consume current diet with minimal overt s/s of aspiration with Mod A verbal cues for use of swallowing compenstory strategies.  SLP Short Term Goal 6 (Week 3): Patient will demonstrate efficient mastication of Dys. 2 trials with minimal oral residue without overt s/s of aspiration with Mod A verbal cues over 3 consecutive sessions prior to upgrade.   Skilled Therapeutic Interventions:Skilled therapy intervention focused on cognitive, speech, and dysphagia goals. Patient required Min A verbal cues for portion control when consuming water via cup, demonstrating overt s/s of aspiration x1 in the form of immediate cough. Patient verbalized at the phrase level in 80% of opportunities given supervision question cues. Given Mod A verbal cues, patient still unable to vocalize on more than about 10% of spoken words. Patient demonstrated selective attention to mildly complex task in a minimally distracting environment for intervals of about 1-2 minutes. Patient required Max A verbal and visual cues for completion of mildly complex problem-solving task sequencing from a  field of 4 and then a field of 6, respectively. Patient left upright in wheelchair at nurse's station with staff supervision. Continue current plan of care.   Function:  Eating Eating   Modified Consistency Diet: Yes Eating Assist Level: Supervision or verbal cues           Cognition Comprehension Comprehension assist level: Understands basic 50 - 74% of the time/ requires cueing 25 - 49% of the time  Expression   Expression assist level: Expresses basic 25 - 49% of the time/requires cueing 50 - 75% of the time. Uses single words/gestures.  Social Interaction Social Interaction assist level: Interacts appropriately 50 - 74% of the time - May be physically or verbally inappropriate.  Problem Solving Problem solving assist level: Solves basic 25 - 49% of the time - needs direction more than half the time to initiate, plan or complete simple activities  Memory Memory assist level: Recognizes or recalls less than 25% of the time/requires cueing greater than 75% of the time    Pain Pain Assessment Pain Assessment: No/denies pain   Therapy/Group: Individual Therapy  Thornton Papas 05/17/2016, 11:53 AM

## 2016-05-18 ENCOUNTER — Inpatient Hospital Stay (HOSPITAL_COMMUNITY): Payer: No Typology Code available for payment source | Admitting: Physical Therapy

## 2016-05-18 LAB — GLUCOSE, CAPILLARY
Glucose-Capillary: 105 mg/dL — ABNORMAL HIGH (ref 65–99)
Glucose-Capillary: 118 mg/dL — ABNORMAL HIGH (ref 65–99)
Glucose-Capillary: 118 mg/dL — ABNORMAL HIGH (ref 65–99)
Glucose-Capillary: 131 mg/dL — ABNORMAL HIGH (ref 65–99)
Glucose-Capillary: 143 mg/dL — ABNORMAL HIGH (ref 65–99)
Glucose-Capillary: 146 mg/dL — ABNORMAL HIGH (ref 65–99)
Glucose-Capillary: 97 mg/dL (ref 65–99)

## 2016-05-18 NOTE — Progress Notes (Signed)
Physical Therapy Session Note  Patient Details  Name: [name] MRN: [id number] Date of Birth: January 18, 1961  Today's Date: 05/18/2016 PT Individual Time: 0900-0945 PT Individual Time Calculation (min): 45 min    Short Term Goals: Week 1:  PT Short Term Goal 1 (Week 1): Pt will demonstrate sustained attention x 1 minute with supervision. PT Short Term Goal 1 - Progress (Week 1): Not met PT Short Term Goal 2 (Week 1): Pt will perform rolling L/R in bed with bed features & max assist +1.  PT Short Term Goal 2 - Progress (Week 1): Not met PT Short Term Goal 3 (Week 1): Pt will consistently demonstrate sitting balance with mod assist. PT Short Term Goal 3 - Progress (Week 1): Met PT Short Term Goal 4 (Week 1): Pt will transfer supine<>sitting with bed features & max assist + 1. PT Short Term Goal 4 - Progress (Week 1): Not met  Skilled Therapeutic Interventions/Progress Updates:    Pt was seen bedside in the am. Pt transferred supine to edge of bed with side rail, head of bed elevated and min A with verbal cues. Pt transferred edge of bed to w/c with sliding board and +2 min A. Utilized scooter chair from gym to assist with transfer and prevent weight bearing B LEs. Pt required multiple cues throughout transfer. Pt propelled w/c about 100 feet with B UEs and min A with verbal cues. Pt transferred w/c to w/c with sliding board and + min A with scooter chair to assist with NWB B LEs and constant verbal cues. Pt required constant verbal cues throughout treatment to prevent increased WB B LEs. Pt able to follow simple commands.   Therapy Documentation Precautions:  Precautions Precautions: Fall, Cervical Precaution Comments: feeding tube, c collar at all times, left LE external fixator, right foot cast  Required Braces or Orthoses: Cervical Brace Cervical Brace: Hard collar, At all times Restrictions Weight Bearing Restrictions: Yes RLE Weight Bearing: Non weight bearing LLE Weight Bearing:  Non weight bearing General:   Pain: Pain Assessment Pain Score: 5   See Function Navigator for Current Functional Status.   Therapy/Group: Individual Therapy  Dub Amis 05/18/2016, 10:11 AM

## 2016-05-18 NOTE — Plan of Care (Signed)
Problem: RH BOWEL ELIMINATION Goal: RH STG MANAGE BOWEL WITH ASSISTANCE STG Manage Bowel with max Assistance.   Outcome: Not Progressing LBM 11/7; colace per prn order given

## 2016-05-18 NOTE — Plan of Care (Signed)
Problem: RH BLADDER ELIMINATION Goal: RH STG MANAGE BLADDER WITH ASSISTANCE STG Manage Bladder With max Assistance   Outcome: Not Progressing incontinence

## 2016-05-18 NOTE — Progress Notes (Signed)
Physical Therapy Session Note  Patient Details  Name: [name] MRN: [id number] Date of Birth: 1961/04/25  Today's Date: 05/17/2016 PT Individual Time: [id number] PT Individual Time Calculation: 15 min       Short Term Goals: Week 3:  PT Short Term Goal 1 (Week 3): Pt will perform bed <> chair transfer with max A PT Short Term Goal 2 (Week 3): Pt will attend to functional task x 30 seconds with min A PT Short Term Goal 3 (Week 3): Pt will roll in bed with use of bed rails and mod A Week 4:     Skilled Therapeutic Interventions/Progress Updates:     Patient received supine in bed and reports significant fatigue, but agreeable to bed level therex.  PT instructed patient in supine therex including AROM heel sides, SLR, reciprocal marching, and hip abduction. All exercises completed 2x 10 BLE with mod cues to prevent WB through the LLE as well as proper form to improved strengthening aspects of movement, as well as remain on task. Patient able to maintain NWB through RLE without cues.   Patient declined addition therex or activity due to fatigue, and left supine in bed with call bell in reach.   Therapy Documentation Precautions:  Precautions Precautions: Fall, Cervical Precaution Comments: feeding tube, c collar at all times, left LE external fixator, right foot cast  Required Braces or Orthoses: Cervical Brace Cervical Brace: Hard collar, At all times Restrictions Weight Bearing Restrictions: Yes RLE Weight Bearing: Non weight bearing LLE Weight Bearing: Non weight bearing General:   Missed Time: 15 minutes ( pt fatigue)   Vital Signs: Therapy Vitals Temp: 98 F (36.7 C) Temp Source: Oral Pulse Rate: 70 Resp: 18 BP: 124/71 Patient Position (if appropriate): Lying Oxygen Therapy SpO2: 97 %   See Function Navigator for Current Functional Status.   Therapy/Group: Individual Therapy  [name] 05/18/2016, 7:46 AM
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
## 2016-05-19 ENCOUNTER — Inpatient Hospital Stay (HOSPITAL_COMMUNITY): Payer: No Typology Code available for payment source | Admitting: Physical Therapy

## 2016-05-19 LAB — GLUCOSE, CAPILLARY
Glucose-Capillary: 117 mg/dL — ABNORMAL HIGH (ref 65–99)
Glucose-Capillary: 126 mg/dL — ABNORMAL HIGH (ref 65–99)
Glucose-Capillary: 82 mg/dL (ref 65–99)
Glucose-Capillary: 93 mg/dL (ref 65–99)
Glucose-Capillary: 98 mg/dL (ref 65–99)
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
## 2016-05-19 NOTE — Progress Notes (Signed)
Physical Therapy Session Note  Patient Details  Name: [name] MRN: [id number] Date of Birth: Nov 19, 1960  Today's Date: 05/19/2016 PT Individual Time: 1002-1100 PT Individual Time Calculation (min): 58 min    Short Term Goals: Week 3:  PT Short Term Goal 1 (Week 3): Pt will perform bed <> chair transfer with max A PT Short Term Goal 2 (Week 3): Pt will attend to functional task x 30 seconds with min A PT Short Term Goal 3 (Week 3): Pt will roll in bed with use of bed rails and mod A  Skilled Therapeutic Interventions/Progress Updates:    Pt received in bed on bedpan, pt rolled L<>R with min assist and bed rails for therapist to perform peri-hygiene with pt assisting in front. Pt required max cuing to not push through BLE to assist with rolling but with poor demo by pt. Pt required max assist to thread & don shorts but was able to don shirt with supervision while sitting EOB. Pt transferred supine>sitting EOB with bed rails & min assist with max cuing for NWB BLE. Educated pt on NWB BLE and pt able to recall this 2 times later during session with minimal cuing. Pt with improved ability to lean L<>R for sliding board placement and completed bed>manual w/c with Mod assist +2 to maintain NWB BLE. Pt sorted cards into 2 piles by color with only 20% cuing and therapist attached theraband on L w/c wheel for tactile feedback during propulsion. During session pt propelled w/c multiple times, up to a distance of 100 ft with supervision<>mod assist. Pt with L inattention requiring multimodal cuing for LUE placement on wheels plus verbal & visual cuing for linear path (green line on floor) with improved trajectory. At end of session pt transferred manual w/c>bed with min assist +2, increased time & cuing for NWB BLE. Pt able to recall room number after cuing from therapist, but unable to recall his current age even with max cuing. Pt transferred sit>supine with min assist and scooted to head of bed with max  cuing but poor demo for NWB BLE. Pt noted L shoulder pain when attempting to reach for bed rail. At end of session pt left in bed with alarm set, BUE mittens donned & all needs within reach. Pt correctly demonstrated use of pancake call bell.    Therapy Documentation Precautions:  Precautions Precautions: Fall, Cervical Precaution Comments: feeding tube, c collar at all times, left LE external fixator, right foot cast  Required Braces or Orthoses: Cervical Brace Cervical Brace: Hard collar, At all times Restrictions Weight Bearing Restrictions: Yes RLE Weight Bearing: Non weight bearing LLE Weight Bearing: Non weight bearing   See Function Navigator for Current Functional Status.   Therapy/Group: [place] 05/19/2016, 11:12 AM

## 2016-05-19 NOTE — Plan of Care (Signed)
Problem: RH BLADDER ELIMINATION Goal: RH STG MANAGE BLADDER WITH ASSISTANCE STG Manage Bladder With max Assistance   Outcome: Not Progressing incontinence

## 2016-05-20 ENCOUNTER — Inpatient Hospital Stay (HOSPITAL_COMMUNITY): Payer: No Typology Code available for payment source | Admitting: Speech Pathology

## 2016-05-20 ENCOUNTER — Inpatient Hospital Stay (HOSPITAL_COMMUNITY): Payer: No Typology Code available for payment source

## 2016-05-20 ENCOUNTER — Inpatient Hospital Stay (HOSPITAL_COMMUNITY): Payer: No Typology Code available for payment source | Admitting: Physical Therapy

## 2016-05-20 ENCOUNTER — Encounter (HOSPITAL_COMMUNITY): Payer: Self-pay

## 2016-05-20 LAB — GLUCOSE, CAPILLARY
Glucose-Capillary: 100 mg/dL — ABNORMAL HIGH (ref 65–99)
Glucose-Capillary: 118 mg/dL — ABNORMAL HIGH (ref 65–99)
Glucose-Capillary: 128 mg/dL — ABNORMAL HIGH (ref 65–99)
Glucose-Capillary: 142 mg/dL — ABNORMAL HIGH (ref 65–99)
Glucose-Capillary: 91 mg/dL (ref 65–99)

## 2016-05-20 MED ORDER — PROCHLORPERAZINE MALEATE 5 MG PO TABS
10.0000 mg | ORAL_TABLET | Freq: Four times a day (QID) | ORAL | Status: DC | PRN
  Administered 2016-05-20: 10 mg via ORAL
  Filled 2016-05-20: qty 2

## 2016-05-20 NOTE — Progress Notes (Signed)
Occupational Therapy Note  Patient Details  Name: [name] MRN: [id number] Date of Birth: Jul 20, 1960  Today's Date: 05/20/2016 OT Individual Time: 1400-1430 OT Individual Time Calculation (min): 30 min    Pt c/o discomfort in LUE/shoulder; RN aware Individual Therapy  Pt resting in w/c after PT session.  Pt initially engaged in [place] tasks with focus on attending to his left and using his LUE to complete task (2.46 second reaction time). Pt transitioned to pipe tree task with instructions to use BUE to complete task and deconstruct structures.  Pt completed 2 simple structures with min verbal cues to correct errors.  Pt continues to exhibit improved attention to task and problem solving abilities during functional tasks.  Pt remained in w/c and returned to nursing station awaiting PT.  [name] [facility] 05/20/2016, 2:33 PM

## 2016-05-20 NOTE — Plan of Care (Signed)
Problem: RH BLADDER ELIMINATION Goal: RH STG MANAGE BLADDER WITH ASSISTANCE STG Manage Bladder With max Assistance   Outcome: Not Progressing Incontinent, majority of time

## 2016-05-20 NOTE — Progress Notes (Signed)
Speech Language Pathology Daily Session Note  Patient Details  Name: [name] MRN: [id number] Date of Birth: 1961-05-28  Today's Date: 05/20/2016 SLP Individual Time: 1000-1055 SLP Individual Time Calculation (min): 55 min   Short Term Goals: Week 3: SLP Short Term Goal 1 (Week 3): Patient will verbalize at the word level in 75% of opportunities with Mod A multimodal cues.  SLP Short Term Goal 2 (Week 3): Patient will vocalize in 25% of opportunities with Max A multimodal cues.  SLP Short Term Goal 3 (Week 3): Patient will demonstrate sustained attention to a functional task for 3 mins with Max A multimodal cues.  SLP Short Term Goal 4 (Week 3): Patient will perform basic functional tasks (face washing, brushing teeth with suction toothbrush, apply chapstick, etc) with Mod A multimodal cues.  SLP Short Term Goal 5 (Week 3): Patient will consume current diet with minimal overt s/s of aspiration with Mod A verbal cues for use of swallowing compenstory strategies.  SLP Short Term Goal 6 (Week 3): Patient will demonstrate efficient mastication of Dys. 2 trials with minimal oral residue without overt s/s of aspiration with Mod A verbal cues over 3 consecutive sessions prior to upgrade.   Skilled Therapeutic Interventions:Skilled therapy intervention focused on speech and dysphagia goals. At the start of session patient expressed that he was feeling nauseated; RN aware. Patient given ginger ale to ease stomach, and independently self-monitored for portion control and rate throughout session with no overt s/s of aspiration. Patient recalled a few events from the weekend given supervision question cues, stating that he'd had speech therapy on Saturday but not on Sunday. Patient verbalized at the phrase level given Min A question cues during social conversation, fading to independent verbalization throughout item description task. Overall patient verbalized in 100% of opportunities given supervision  question cues. Patient was able to vocalize in about 10% of given opportunities, and is still communicating at a whisper unless required to communicate with someone at a distance. In the latter part of the session the patient told therapist he was dizzy; he attempted to continue with treatment task but then told therapist he needed to return to bed. With NT assistance patient was left supine in bed with bed alarm on and all needs within reach. Continue current plan of care.    Function:  Eating Eating   Modified Consistency Diet: Yes Eating Assist Level: Swallowing techniques: self managed           Cognition Comprehension Comprehension assist level: Understands basic 90% of the time/cues < 10% of the time  Expression   Expression assist level: Expresses basic 50 - 74% of the time/requires cueing 25 - 49% of the time. Needs to repeat parts of sentences.  Social Interaction Social Interaction assist level: Interacts appropriately 75 - 89% of the time - Needs redirection for appropriate language or to initiate interaction.  Problem Solving Problem solving assist level: Solves basic 75 - 89% of the time/requires cueing 10 - 24% of the time  Memory Memory assist level: Recognizes or recalls 25 - 49% of the time/requires cueing 50 - 75% of the time    Pain Pain Assessment Pain Assessment: No/denies pain  Therapy/Group: Individual Therapy  Thornton Papas 05/20/2016, 12:54 PM

## 2016-05-20 NOTE — Progress Notes (Signed)
Physical Therapy Session Note  Patient Details  Name: [name] MRN: [id number] Date of Birth: February 06, 1961  Today's Date: 05/20/2016 PT Individual Time: 1305-1400 PT Individual Time Calculation (min): 55 min    Short Term Goals: Week 3:  PT Short Term Goal 1 (Week 3): Pt will perform bed <> chair transfer with max A PT Short Term Goal 2 (Week 3): Pt will attend to functional task x 30 seconds with min A PT Short Term Goal 3 (Week 3): Pt will roll in bed with use of bed rails and mod A  Skilled Therapeutic Interventions/Progress Updates:    Pt received in bed & agreeable to tx, noting his shoulders hurt "bad" & RN made aware. Pt transferred supine>sitting EOB with close supervision and cuing for NWB BLE. Provided total assist for w/c & sliding board set up & pt transferred bed>w/c to lower surface with mod assist +1 to maintain NWB BLE. Throughout session pt propelled w/c room<>ortho gym with BUE & supervision/min assist requiring max multimodal cuing to attend to L and utilize LUE to propel w/c. Pt transferred onto mat table with mod assist +2 to elevated surface and performed scoots around mat to increase pt's ability to scoot buttocks to carryover into sliding board transfers. Pt then returned to w/c and propelled to nurses station in same manner as noted above. Pt left at nurses station with [place] donned.   Pt with improving ability to recall new information (room number with multiple trials without cuing, as well as NWB BLE). Pt with continued complaints of being hot with cool cloth & fans helping. Pt also requires frequent rest breaks during all activities 2/2 fatigue.  Therapy Documentation Precautions:  Precautions Precautions: Fall, Cervical Precaution Comments: feeding tube, c collar at all times, left LE external fixator, right foot cast  Required Braces or Orthoses: Cervical Brace Cervical Brace: Hard collar, At all times Restrictions Weight Bearing Restrictions:  Yes RLE Weight Bearing: Non weight bearing LLE Weight Bearing: Non weight bearing   See Function Navigator for Current Functional Status.   Therapy/Group: Individual Therapy  [name] 05/20/2016, 5:42 PM

## 2016-05-20 NOTE — Progress Notes (Signed)
Occupational Therapy Session Note  Patient Details  Name: [name] MRN: [id number] Date of Birth: 1960-12-19  Today's Date: 05/20/2016 OT Individual Time: 0900-1000 OT Individual Time Calculation (min): 60 min     Short Term Goals: Week 3:  OT Short Term Goal 1 (Week 3): Pt will initiate threading BLE into pants with max A while seated EOB OT Short Term Goal 2 (Week 3): Pt will attend to functional task for 60 seconds with min verbal cues OT Short Term Goal 3 (Week 3): Pt will maintain unsupported sitting balance with supervision for 5 mins to complete functional task OT Short Term Goal 4 (Week 3): Pt will perform lateral lean in preparation for pulling up pants and/or performing toilet hygiene  Skilled Therapeutic Interventions/Progress Updates:    Pt resting in bed upon arrival and requested use of bedpan and urinal.  Pt stated he felt like he needed to have a BM but was unsuccessful.  Pt engaged in LB dressing tasks at bed level, assisting with pulling up pants.  Pt rolls side to side with min A during LB dressing tasks. Pt sat EOB and donned shirt at close supervision level.  Pt completed sliding board transfer to w/c with mod A and max verbal cues for sequencing and safety.  Pt completed grooming tasks w/c level at sink.  Pt performs lateral leans with mod A in preparation for sliding board placement.  Pt continues to exhibit impulsive behaviors during transfers and in preparation for transfers.  Pt attends to tasks for approx 3 mins with min verbal cues for redirection.  Focus on bed mobility, sitting balance, functional transfers, task initiation, sequencing, attention to task, and safety awareness to increase independence with BADLS.   Therapy Documentation Precautions:  Precautions Precautions: Fall, Cervical Precaution Comments: feeding tube, c collar at all times, left LE external fixator, right foot cast  Required Braces or Orthoses: Cervical Brace Cervical Brace: Hard  collar, At all times Restrictions Weight Bearing Restrictions: Yes RLE Weight Bearing: Non weight bearing LLE Weight Bearing: Non weight bearing Pain: Pain Assessment Pain Assessment: 0-10 Pain Score: 5  Pain Type: Acute pain Pain Location: Neck Pain Intervention(s):RN aware and meds admin during session   See Function Navigator for Current Functional Status.   Therapy/Group: Individual Therapy  Leroy Libman 05/20/2016, 10:02 AM
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
## 2016-05-20 NOTE — Progress Notes (Signed)
Physical Therapy Session Note  Patient Details  Name: [name] MRN: [id number] Date of Birth: Feb 21, 1961  Today's Date: 05/20/2016 PT Individual Time: [id number] PT Individual Time Calculation (min): 58 min    Short Term Goals: Week 3:  PT Short Term Goal 1 (Week 3): Pt will perform bed <> chair transfer with max A PT Short Term Goal 2 (Week 3): Pt will attend to functional task x 30 seconds with min A PT Short Term Goal 3 (Week 3): Pt will roll in bed with use of bed rails and mod A Week 4:     Skilled Therapeutic Interventions/Progress Updates:    Pt just finished with back to back PT and OT sessions and reports being very fatigued. With encouragement, agreeable to therapy session and redirected throughout session. Focused on addressing L attention, problem solving, and w/c mobility during w/c mobility task. Initially focused on straight line navigation using visual feedback on floor (green line) for patient to stay in straight path with supervision. Progressed to dynamic and attention/problem solving activity with obstacles in pathway and directions to pick up horsehoes (5) located on L and R side of hallway. Pt able to identify all locations of horseshoes and direct w/c to those locations with min physical assist and mod verbal cues but pt with tendency to go past horseshoe and attempt to reach behind him with LUE to get it. Pt repeated this each time he came up to the horseshoe despite cues for technique and approach. Pt with appointment with barber at 4pm, so needed to be up in w/c. Transfer training with slideboard from manual w/c -> EOB -> tilt in space w/c with overall min assist and verbal cues for NWB and initiation. Pt required more time to return to tilt in space (to R) with mod verbal cues but better able to maintain weightbearing status when cues given to keep legs extended in front of patient. Left up in tilt in space w/c with safety belt donned and mittens applied at nurses  station.    Therapy Documentation Precautions:  Precautions Precautions: Fall, Cervical Precaution Comments: feeding tube, c collar at all times, left LE external fixator, right foot cast  Required Braces or Orthoses: Cervical Brace Cervical Brace: Hard collar, At all times Restrictions Weight Bearing Restrictions: Yes RLE Weight Bearing: Non weight bearing LLE Weight Bearing: Non weight bearing  Pain: No complaints of pain - reports fatigue.   See Function Navigator for Current Functional Status.   Therapy/Group: Individual Therapy  [name], PT, DPT  05/20/2016, 3:45 PM

## 2016-05-21 ENCOUNTER — Inpatient Hospital Stay (HOSPITAL_COMMUNITY): Payer: No Typology Code available for payment source | Admitting: *Deleted

## 2016-05-21 ENCOUNTER — Inpatient Hospital Stay (HOSPITAL_COMMUNITY): Payer: Self-pay | Admitting: Physical Therapy

## 2016-05-21 ENCOUNTER — Inpatient Hospital Stay (HOSPITAL_COMMUNITY): Payer: No Typology Code available for payment source | Admitting: Physical Therapy

## 2016-05-21 ENCOUNTER — Inpatient Hospital Stay (HOSPITAL_COMMUNITY): Payer: No Typology Code available for payment source | Admitting: Speech Pathology

## 2016-05-21 ENCOUNTER — Inpatient Hospital Stay (HOSPITAL_COMMUNITY): Payer: Self-pay

## 2016-05-21 ENCOUNTER — Inpatient Hospital Stay (HOSPITAL_COMMUNITY): Payer: No Typology Code available for payment source

## 2016-05-21 DIAGNOSIS — M7582 Other shoulder lesions, left shoulder: Secondary | ICD-10-CM

## 2016-05-21 LAB — GLUCOSE, CAPILLARY
Glucose-Capillary: 111 mg/dL — ABNORMAL HIGH (ref 65–99)
Glucose-Capillary: 114 mg/dL — ABNORMAL HIGH (ref 65–99)
Glucose-Capillary: 112 mg/dL — ABNORMAL HIGH (ref 65–99)
Glucose-Capillary: 103 mg/dL — ABNORMAL HIGH (ref 65–99)

## 2016-05-21 MED ORDER — TRAMADOL HCL 50 MG PO TABS
25.0000 mg | ORAL_TABLET | Freq: Four times a day (QID) | ORAL | Status: DC | PRN
  Administered 2016-05-21 – 2016-06-02 (×14): 25 mg via ORAL
  Filled 2016-05-21 (×14): qty 1

## 2016-05-21 MED ORDER — MUSCLE RUB 10-15 % EX CREA
TOPICAL_CREAM | CUTANEOUS | Status: DC | PRN
  Administered 2016-05-22: 17:00:00 via TOPICAL
  Filled 2016-05-21: qty 85

## 2016-05-21 MED ORDER — MEGESTROL ACETATE 400 MG/10ML PO SUSP
400.0000 mg | Freq: Two times a day (BID) | ORAL | Status: DC
  Administered 2016-05-21 – 2016-06-02 (×25): 400 mg via ORAL
  Filled 2016-05-21 (×26): qty 10

## 2016-05-21 NOTE — Progress Notes (Signed)
Speech Language Pathology Daily Session Note  Patient Details  Name: GLENDALE OPENSHAW MRN: [id number] Date of Birth: 1960/09/06  Today's Date: 05/21/2016 SLP Individual Time: 1130-1230 SLP Individual Time Calculation (min): 60 min   Short Term Goals: Week 3: SLP Short Term Goal 1 (Week 3): Patient will verbalize at the word level in 75% of opportunities with Mod A multimodal cues.  SLP Short Term Goal 2 (Week 3): Patient will vocalize in 25% of opportunities with Max A multimodal cues.  SLP Short Term Goal 3 (Week 3): Patient will demonstrate sustained attention to a functional task for 3 mins with Max A multimodal cues.  SLP Short Term Goal 4 (Week 3): Patient will perform basic functional tasks (face washing, brushing teeth with suction toothbrush, apply chapstick, etc) with Mod A multimodal cues.  SLP Short Term Goal 5 (Week 3): Patient will consume current diet with minimal overt s/s of aspiration with Mod A verbal cues for use of swallowing compenstory strategies.  SLP Short Term Goal 6 (Week 3): Patient will demonstrate efficient mastication of Dys. 2 trials with minimal oral residue without overt s/s of aspiration with Mod A verbal cues over 3 consecutive sessions prior to upgrade.   Skilled Therapeutic Interventions:Skilled therapy intervention focused on cognitive and dysphagia goals. Patient required Mod A verbal cues for problem-solving a basic calendar task. After the activity, patient told clinician that he feels his performance is impacted by feeling "under pressure to do well." Therapist offered to give greater task independence when possible during future activities and patient said he felt that may help. Patient sustained attention to calendar task for one minute intervals over the course of 20 minutes given Max A verbal and visual cues. Patient presented with intermittent throat-clearing throughout consumption of meal tray, but claims no discomfort and that the throat-clearing is  habitual. Suspect throat-clearing unrelated to dysphagia per both patient report and consistency of results with latest patient MBS, when he also exhibited throat-clearing without penetration or aspiration. Patient was independent for portion and rate control while consuming lunch tray of Dys. 1 textures, but showed impulsivity with rate and portion while consuming thin liquids. Therapist left note for patient's wife asking her to bring his dentures for future trials of upgraded textures. Patient independently verbalized at the phrase level, engaging in social conversation for about 5 minutes. Given Min A verbal cues, patient was able to voice in about 15% of given opportunities. Patient left reclined in bed with bed alarm on, bilateral safety mits on, and all needs within reach. Continue current plan of care.   Function:  Eating Eating   Modified Consistency Diet: Yes Eating Assist Level: Supervision or verbal cues           Cognition Comprehension Comprehension assist level: Understands basic 90% of the time/cues < 10% of the time  Expression   Expression assist level: Expresses basic 50 - 74% of the time/requires cueing 25 - 49% of the time. Needs to repeat parts of sentences.  Social Interaction Social Interaction assist level: Interacts appropriately 75 - 89% of the time - Needs redirection for appropriate language or to initiate interaction.  Problem Solving Problem solving assist level: Solves basic 50 - 74% of the time/requires cueing 25 - 49% of the time  Memory Memory assist level: Recognizes or recalls 25 - 49% of the time/requires cueing 50 - 75% of the time    Pain Pain Assessment Pain Assessment: No/denies pain   Therapy/Group: Individual Therapy  Thornton Papas  05/21/2016, 3:07 PM

## 2016-05-21 NOTE — Progress Notes (Signed)
Physical Therapy Session Note  Patient Details  Name: [name] MRN: [id number] Date of Birth: 1961/05/26  Today's Date: 05/21/2016 PT Individual Time: 1634-1700 PT Individual Time Calculation (min): 26 min    Short Term Goals: Week 3:  PT Short Term Goal 1 (Week 3): Pt will perform bed <> chair transfer with max A PT Short Term Goal 2 (Week 3): Pt will attend to functional task x 30 seconds with min A PT Short Term Goal 3 (Week 3): Pt will roll in bed with use of bed rails and mod A  Skilled Therapeutic Interventions/Progress Updates:    Pt received sitting on EOB with wife present for tx. Pt noted neck & shoulder pain & RN made aware; meds administered during session. Pt completed lateral scoot bed>w/c with sliding board and Mod assist +1 with therapist providing manual facilitation for NWB BLE and cuing for scooting across board as well as hand placement. Pt propelled w/c x 50 ft with mod assist for turns but supervision with max cuing for L attention in linear path in open hallway. Cognitive remediation task completed with pt assembling month cards in calendar order. Pt able to attend to task for increased periods of time (1 min + 1 min) with supervision and only 10% cuing to correctly sort cards. Pt able to recall name of OT he worked with earlier today in addition to recalling this therapist's name. Pt did require cuing to recall current month. At end of session pt left sitting in w/c in room with wife present to supervise. Informed pt's wife to notify nursing staff of when she leaves & she voiced understanding; RN notified as well.   Therapy Documentation Precautions:  Precautions Precautions: Fall, Cervical Precaution Comments: feeding tube, c collar at all times, left LE external fixator, right foot cast  Required Braces or Orthoses: Cervical Brace Cervical Brace: Hard collar, At all times Restrictions Weight Bearing Restrictions: Yes RLE Weight Bearing: Non weight  bearing LLE Weight Bearing: Non weight bearing   See Function Navigator for Current Functional Status.   Therapy/Group: Individual Therapy  [name] 05/21/2016, 5:04 PM

## 2016-05-21 NOTE — Progress Notes (Signed)
Occupational Therapy Note  Patient Details  Name: [name] MRN: [id number] Date of Birth: 07-30-1960  Today's Date: 05/21/2016 OT Individual Time: 1300-1400 OT Individual Time Calculation (min): 60 min    Pt c/o L scapula pain (unrated); RN aware; Kinesio Tape applied and Kpad placed when returned to bed Individual therapy  Pt resting in bed upon arrival and performed supine>sit EOB in preparation for sliding board transfer to w/c with mod A for transfer.  Pt propelled partially to ADL kitchen and engaged in pudding preparation with focus on following directions as printed on box.  Pt required his glasses to read the directions.  Pt required min questioning cues but was able to state steps as well as ingredients and supplies needed to complete task. Kinesio tape applied to L scapula for pain management. Pt c/o nausea and returned to room.  Pt performed sliding board transfer to elevated surface (bed) with tot A + 2 (pt=50%). Pt remained in bed with Kpad positioned and all needs within reach.   [name] [facility] 05/21/2016, 2:08 PM

## 2016-05-21 NOTE — Progress Notes (Signed)
Occupational Therapy Session Note  Patient Details  Name: [name] MRN: [id number] Date of Birth: 1961/06/30  Today's Date: 05/21/2016 OT Individual Time: [id number] OT Individual Time Calculation (min): 57 min     Short Term Goals: Week 3:  OT Short Term Goal 1 (Week 3): Pt will initiate threading BLE into pants with max A while seated EOB OT Short Term Goal 2 (Week 3): Pt will attend to functional task for 60 seconds with min verbal cues OT Short Term Goal 3 (Week 3): Pt will maintain unsupported sitting balance with supervision for 5 mins to complete functional task OT Short Term Goal 4 (Week 3): Pt will perform lateral lean in preparation for pulling up pants and/or performing toilet hygiene  Skilled Therapeutic Interventions/Progress Updates:    Pt resting in bed upon arrival.  Pt engaged in LB bathing tasks at bed level with assist to bathe buttocks.  Pt rolls L and R with min A and max verbal cues to avoid weight bearing through BLE.  Pt performed supine>sit EOB with close supervision and maintained sitting balance to complete UB bathing/dressing tasks at supervision level.  Pt transferred to w/c (sliding board) with mod A and max verbal cues for sequencing and safety.  Pt completed grooming tasks seated in w/c at sink.  Pt engaged in w/c mobility with min verbal cues to avoid running into wall on pt's left.  Adjustments made to leg rests for comfort and safety. Pt returned to nursing station and remained with QRB in place.  Focus on activity tolerance, sitting balance, functional transfers, attention to task, task initiation, sequencing, and safety awareness.  Therapy Documentation Precautions:  Precautions Precautions: Fall, Cervical Precaution Comments: feeding tube, c collar at all times, left LE external fixator, right foot cast  Required Braces or Orthoses: Cervical Brace Cervical Brace: Hard collar, At all times Restrictions Weight Bearing Restrictions: Yes RLE Weight  Bearing: Non weight bearing LLE Weight Bearing: Non weight bearing General:   Vital Signs:   Pain: Pain Assessment Pain Assessment: 0-10 Pain Score: Asleep Pain Type: Acute pain Pain Location: Shoulder Pain Orientation: Left Pain Descriptors / Indicators: Aching Pain Frequency: Intermittent Pain Onset: Gradual Patients Stated Pain Goal: 3 Pain Intervention(s): Medication (See eMAR);Repositioned;Emotional support ADL: ADL ADL Comments: see functional navigator Exercises:   Other Treatments:    See Function Navigator for Current Functional Status.   Therapy/Group: Individual Therapy  Leroy Libman 05/21/2016, 9:59 AM
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
## 2016-05-21 NOTE — Progress Notes (Signed)
Physical Therapy Session Note  Patient Details  Name: [name] MRN: [id number] Date of Birth: Apr 30, 1961  Today's Date: 05/21/2016 PT Individual Time: 1000-1100 PT Individual Time Calculation (min): 60 min    Short Term Goals: Week 3:  PT Short Term Goal 1 (Week 3): Pt will perform bed <> chair transfer with max A PT Short Term Goal 2 (Week 3): Pt will attend to functional task x 30 seconds with min A PT Short Term Goal 3 (Week 3): Pt will roll in bed with use of bed rails and mod A  Skilled Therapeutic Interventions/Progress Updates:   Pt presented at nsg station in w/c. Pt propelled w/c 61ft with min/modA. Performed  SB transfer to mat, min cues to maintain wt bearing precautions. Performed cognitive activities at mat unsupported x20 min. Performed sorting activities via color and numerically. Pt required mod cues for maintaining task particularly when becoming fatigued.  Pt with c/o becoming hot and increased soreness in neck. Performed SB transfer return to chair.  Returned to nsg station at end of session.   Therapy Documentation Precautions:  Precautions Precautions: Fall, Cervical Precaution Comments: feeding tube, c collar at all times, left LE external fixator, right foot cast  Required Braces or Orthoses: Cervical Brace Cervical Brace: Hard collar, At all times Restrictions Weight Bearing Restrictions: Yes RLE Weight Bearing: Non weight bearing LLE Weight Bearing: Non weight bearing General:   Vital Signs: Therapy Vitals Temp: 97.7 F (36.5 C) Temp Source: Oral Pulse Rate: 80 Resp: 18 BP: (!) 104/58 Patient Position (if appropriate): Lying Oxygen Therapy SpO2: 100 % O2 Device: Not Delivered Pain: Pain Assessment Pain Assessment: No/denies pain Pain Score: 5  Mobility:   Locomotion :    Trunk/Postural Assessment :    Balance:   Exercises:   Other Treatments:     See Function Navigator for Current Functional Status.   Therapy/Group:  Individual Therapy  [name] 05/21/2016, 2:54 PM

## 2016-05-22 ENCOUNTER — Inpatient Hospital Stay (HOSPITAL_COMMUNITY): Payer: No Typology Code available for payment source

## 2016-05-22 ENCOUNTER — Inpatient Hospital Stay (HOSPITAL_COMMUNITY): Payer: No Typology Code available for payment source | Admitting: Physical Therapy

## 2016-05-22 ENCOUNTER — Inpatient Hospital Stay (HOSPITAL_COMMUNITY): Payer: Self-pay | Admitting: Occupational Therapy

## 2016-05-22 ENCOUNTER — Inpatient Hospital Stay (HOSPITAL_COMMUNITY): Payer: No Typology Code available for payment source | Admitting: Speech Pathology

## 2016-05-22 LAB — GLUCOSE, CAPILLARY
Glucose-Capillary: 128 mg/dL — ABNORMAL HIGH (ref 65–99)
Glucose-Capillary: 141 mg/dL — ABNORMAL HIGH (ref 65–99)
Glucose-Capillary: 153 mg/dL — ABNORMAL HIGH (ref 65–99)
Glucose-Capillary: 157 mg/dL — ABNORMAL HIGH (ref 65–99)

## 2016-05-22 MED ORDER — DICLOFENAC SODIUM 1 % TD GEL
2.0000 g | Freq: Four times a day (QID) | TRANSDERMAL | Status: DC
  Administered 2016-05-22 – 2016-06-03 (×40): 2 g via TOPICAL
  Filled 2016-05-22: qty 100

## 2016-05-22 NOTE — Progress Notes (Signed)
Patient ID: [name], male   DOB: [birth year], 55 y.o.   MRN: [id number] Subjective:  The patient is alert and pleasant. He is in no apparent distress. He says he has "a little" neck pain.  Objective: Vital signs in last 24 hours: Temp:  [98 F (36.7 C)-98.7 F (37.1 C)] 98 F (36.7 C) (11/15 1552) Pulse Rate:  [84-97] 84 (11/15 1552) Resp:  [17-18] 18 (11/15 1552) BP: (113-130)/(77-81) 113/78 (11/15 1552) SpO2:  [98 %-99 %] 98 % (11/15 1552) Weight:  [84.5 kg (186 lb 4.6 oz)] 84.5 kg (186 lb 4.6 oz) (11/15 0532)  Intake/Output from previous day: 11/14 0701 - 11/15 0700 In: 600 [P.O.:600] Out: 400 [Urine:400] Intake/Output this shift: Total I/O In: 120 [P.O.:120] Out: -   Physical exam the patient is alert and oriented 2+, person, [facility], he thought it was October. The patient is moving all 4 extremities well. His left lower extremity is in an external fixator. He is wearing a Aspen collar.  Lab Results: No results for input(s): WBC, HGB, HCT, PLT in the last 72 hours. BMET No results for input(s): NA, K, CL, CO2, GLUCOSE, BUN, CREATININE, CALCIUM in the last 72 hours.  Studies/Results: No results found.  Assessment/Plan: C2 fracture: The patient has been wearing his collar for almost 7 weeks. Hopefully his fracture has healed. I will get a cervical CT to evaluate the extent of healing. In the meantime he is to continue wearing his Aspen collar.  LOS: 22 days     Jed Kutch D 05/22/2016, 3:57 PM

## 2016-05-22 NOTE — Progress Notes (Signed)
Occupational Therapy Session Note  Patient Details  Name: [name] MRN: [id number] Date of Birth: 11-02-1960  Today's Date: 05/22/2016 OT Individual Time: 1430-1530 OT Individual Time Calculation (min): 60 min   Short Term Goals: Week 3:  OT Short Term Goal 1 (Week 3): Pt will initiate threading BLE into pants with max A while seated EOB OT Short Term Goal 2 (Week 3): Pt will attend to functional task for 60 seconds with min verbal cues OT Short Term Goal 3 (Week 3): Pt will maintain unsupported sitting balance with supervision for 5 mins to complete functional task OT Short Term Goal 4 (Week 3): Pt will perform lateral lean in preparation for pulling up pants and/or performing toilet hygiene  Skilled Therapeutic Interventions/Progress Updates:   1:1 OT session focused on transfer training and cognitive skills development. Pt greeted in bed, hesitant to participate in therapy 2/2 fatigue, but agreeable to OT with encouragement. Sup<>sit overall close supervision. Sliding board transfer >w/c w/ overall mod A and Mod/max verbal cues for safety and technique. Pt engaged in w/c mobility with tactile cues for proper hand placement to facilitate forward motion, min cues to avoid objects on L side. Pt brought to day room and participated in cognitive task focused on problem solving, reasoning, attention to task, and sequencing using deck of cards. Pt returned to room at end of session and left semi-reclined in bed with 3 rails up and bed alarm on per safety plan.   Therapy Documentation Precautions:  Precautions Precautions: Fall, Cervical Precaution Comments: feeding tube, c collar at all times, left LE external fixator, right foot cast  Required Braces or Orthoses: Cervical Brace Cervical Brace: Hard collar, At all times Restrictions Weight Bearing Restrictions: Yes RLE Weight Bearing: Non weight bearing LLE Weight Bearing: Non weight bearing  See Function Navigator for Current  Functional Status.   Therapy/Group: Individual Therapy  Valma Cava 05/22/2016, 3:37 PM
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
## 2016-05-22 NOTE — Progress Notes (Signed)
Occupational Therapy Session Note  Patient Details  Name: [name] MRN: [id number] Date of Birth: Dec 03, 1960  Today's Date: 05/22/2016 OT Individual Time: 0800-0930 OT Individual Time Calculation (min): 90 min     Short Term Goals: Week 3:  OT Short Term Goal 1 (Week 3): Pt will initiate threading BLE into pants with max A while seated EOB OT Short Term Goal 2 (Week 3): Pt will attend to functional task for 60 seconds with min verbal cues OT Short Term Goal 3 (Week 3): Pt will maintain unsupported sitting balance with supervision for 5 mins to complete functional task OT Short Term Goal 4 (Week 3): Pt will perform lateral lean in preparation for pulling up pants and/or performing toilet hygiene  Skilled Therapeutic Interventions/Progress Updates:    Pt engaged in BADL retraining including bathing at shower level and dressing at bed level and EOB.  Pt required mod A for sliding board transfer to rolling shower chair to engage in bathing tasks.  Pt required min verbal cues for sequencing during bathing tasks.  Pt verbalized discomfort/pain in left shoulder while seated unsupported.  Pt transferred back to bed with maxi-move.  Pt completed LB dressing tasks at bed level and initiated pulling up pants.  Pt attempted to pull up pants over hips bu bridging in bed and required verbal cues to prevent activity.  Pt rolled to sides with min A and required assistance pulling up pants.  Pt completed UB dressing tasks seated EOB before transferring to w/c (mod A sliding board).  Pt engaged in self feeding tasks with focus on portion management and rate of feeding.  Pt completed grooming tasks seated in w/c at sink.  Pt attempted to propel w/c but c/o increased L shoulder pain.  Pt positioned at RN station awaiting next therapy.  Therapy Documentation Precautions:  Precautions Precautions: Fall, Cervical Precaution Comments: feeding tube, c collar at all times, left LE external fixator, right foot cast   Required Braces or Orthoses: Cervical Brace Cervical Brace: Hard collar, At all times Restrictions Weight Bearing Restrictions: Yes RLE Weight Bearing: Non weight bearing LLE Weight Bearing: Non weight bearing General:   Vital Signs: Therapy Vitals Temp: 98.5 F (36.9 C) Temp Source: Oral Pulse Rate: 85 Resp: 18 BP: 120/77 Patient Position (if appropriate): Lying Oxygen Therapy SpO2: 98 % O2 Device: Not Delivered Pain: Pain Assessment Pain Assessment: No/denies pain Pain Score: 0-No pain Pain Intervention(s): Medication (See eMAR);Repositioned;Emotional support ADL: ADL ADL Comments: see functional navigator Exercises:   Other Treatments:    See Function Navigator for Current Functional Status.   Therapy/Group: Individual Therapy  Leroy Libman 05/22/2016, 9:31 AM

## 2016-05-22 NOTE — Progress Notes (Signed)
Speech Language Pathology Daily Session Note  Patient Details  Name: [name] MRN: [id number] Date of Birth: April 26, 1961  Today's Date: 05/22/2016 SLP Individual Time: 1300-1400 SLP Individual Time Calculation (min): 60 min   Short Term Goals: Week 3: SLP Short Term Goal 1 (Week 3): Patient will verbalize at the word level in 75% of opportunities with Mod A multimodal cues.  SLP Short Term Goal 2 (Week 3): Patient will vocalize in 25% of opportunities with Max A multimodal cues.  SLP Short Term Goal 3 (Week 3): Patient will demonstrate sustained attention to a functional task for 3 mins with Max A multimodal cues.  SLP Short Term Goal 4 (Week 3): Patient will perform basic functional tasks (face washing, brushing teeth with suction toothbrush, apply chapstick, etc) with Mod A multimodal cues.  SLP Short Term Goal 5 (Week 3): Patient will consume current diet with minimal overt s/s of aspiration with Mod A verbal cues for use of swallowing compenstory strategies.  SLP Short Term Goal 6 (Week 3): Patient will demonstrate efficient mastication of Dys. 2 trials with minimal oral residue without overt s/s of aspiration with Mod A verbal cues over 3 consecutive sessions prior to upgrade.   Skilled Therapeutic Interventions:Skilled therapy intervention focused on cognitive and dysphagia goals. Given a mildly complex card task, patient independently recalled parameters of the game after 2 minutes and 10 minutes given supervision question cues. Patient required Mod A verbal cues from problem solving of mildly complex card task. Patient independently demonstrated 80% intelligibility at the conversation level, verbalizing in 90% of given opportunities; patient vocalized in about 35% of given opportunities. Due to nausea, patient with limited PO intake; patient repositioned and reported improvement in nausea. Patient with dentures applied, but trials of Dys. 2 textures not attempted due to nausea. Patient  consumed thin liquid via cup and snack of Dys. 1 texture with no overt s/s of aspiration, independently utilizing compensatory strategies of small sips/bites and slower rate. Patient left upright in bed with bilateral safety mits on, bed alarm engaged, and all needs within reach. Continue current plan of care.   Function:  Eating Eating   Modified Consistency Diet: Yes Eating Assist Level: Swallowing techniques: self managed           Cognition Comprehension Comprehension assist level: Follows basic conversation/direction with extra time/assistive device  Expression   Expression assist level: Expresses basic 50 - 74% of the time/requires cueing 25 - 49% of the time. Needs to repeat parts of sentences.  Social Interaction Social Interaction assist level: Interacts appropriately 75 - 89% of the time - Needs redirection for appropriate language or to initiate interaction.  Problem Solving Problem solving assist level: Solves basic 50 - 74% of the time/requires cueing 25 - 49% of the time  Memory Memory assist level: Recognizes or recalls 50 - 74% of the time/requires cueing 25 - 49% of the time    Pain Pain Assessment Pain Type: Acute pain Pain Location: Neck Pain Onset: On-going  Therapy/Group: Individual Therapy  Thornton Papas 05/22/2016, 4:08 PM
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
## 2016-05-22 NOTE — Plan of Care (Signed)
Problem: RH PAIN MANAGEMENT Goal: RH STG PAIN MANAGED AT OR BELOW PT'S PAIN GOAL Less than 3 out 55f 10  Outcome: Not Progressing C/o pain in left shoulder not relieved by current medication regimen

## 2016-05-23 ENCOUNTER — Inpatient Hospital Stay (HOSPITAL_COMMUNITY): Payer: No Typology Code available for payment source | Admitting: Physical Therapy

## 2016-05-23 ENCOUNTER — Inpatient Hospital Stay (HOSPITAL_COMMUNITY): Payer: Self-pay

## 2016-05-23 ENCOUNTER — Inpatient Hospital Stay (HOSPITAL_COMMUNITY): Payer: No Typology Code available for payment source | Admitting: Speech Pathology

## 2016-05-23 LAB — GLUCOSE, CAPILLARY
Glucose-Capillary: 112 mg/dL — ABNORMAL HIGH (ref 65–99)
Glucose-Capillary: 158 mg/dL — ABNORMAL HIGH (ref 65–99)
Glucose-Capillary: 97 mg/dL (ref 65–99)
Glucose-Capillary: 108 mg/dL — ABNORMAL HIGH (ref 65–99)

## 2016-05-23 LAB — BASIC METABOLIC PANEL
Anion gap: 8 (ref 5–15)
BUN: 10 mg/dL (ref 6–20)
CO2: 27 mmol/L (ref 22–32)
Calcium: 9.2 mg/dL (ref 8.9–10.3)
Chloride: 100 mmol/L — ABNORMAL LOW (ref 101–111)
Creatinine, Ser: 0.48 mg/dL — ABNORMAL LOW (ref 0.61–1.24)
GFR calc Af Amer: >60 mL/min (ref >60)
GFR calc non Af Amer: >60 mL/min (ref >60)
Glucose, Bld: 146 mg/dL — ABNORMAL HIGH (ref 65–99)
Potassium: 3.7 mmol/L (ref 3.5–5.1)
Sodium: 135 mmol/L (ref 135–145)

## 2016-05-23 LAB — CBC
HCT: 39.3 % (ref 39.0–52.0)
Hemoglobin: 12.3 g/dL — ABNORMAL LOW (ref 13.0–17.0)
MCH: 27.8 pg (ref 26.0–34.0)
MCHC: 31.3 g/dL (ref 30.0–36.0)
MCV: 88.7 fL (ref 78.0–100.0)
Platelets: 256 10*3/uL (ref 150–400)
RBC: 4.43 MIL/uL (ref 4.22–5.81)
RDW: 14.4 % (ref 11.5–15.5)
WBC: 9.6 10*3/uL (ref 4.0–10.5)

## 2016-05-23 MED ORDER — HYDROCODONE-ACETAMINOPHEN 5-325 MG PO TABS
1.0000 | ORAL_TABLET | ORAL | Status: DC | PRN
  Administered 2016-05-23 – 2016-06-03 (×39): 2 via ORAL
  Filled 2016-05-23 (×39): qty 2
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
## 2016-05-23 NOTE — Progress Notes (Signed)
Patient ID: [name], male   DOB: [birth year], 55 y.o.   MRN: [id number] Subjective:  The patient is alert and pleasant and in no apparent distress.  Objective: Vital signs in last 24 hours: Temp:  [98 F (36.7 C)-98.2 F (36.8 C)] 98.2 F (36.8 C) (11/16 0532) Pulse Rate:  [77-84] 79 (11/16 0955) Resp:  [17-18] 17 (11/16 0532) BP: (113-123)/(67-78) 123/77 (11/16 0955) SpO2:  [98 %] 98 % (11/16 0532) Weight:  [85.2 kg (187 lb 13.3 oz)] 85.2 kg (187 lb 13.3 oz) (11/16 0532)  Intake/Output from previous day: 11/15 0701 - 11/16 0700 In: 360 [P.O.:360] Out: 250 [Urine:250] Intake/Output this shift: No intake/output data recorded.  Physical exam patient is alert and pleasant. He is moving all 4 extremities well. He is wearing a cervical collar.  I have reviewed the patient's cervical CT performed yesterday at [facility]. He has a complex C2 fracture. It is a bit displaced but it seems to be healing.  Lab Results:  Recent Labs  05/23/16 0718  WBC 9.6  HGB 12.3*  HCT 39.3  PLT 256   BMET  Recent Labs  05/23/16 0718  NA 135  K 3.7  CL 100*  CO2 27  GLUCOSE 146*  BUN 10  CREATININE 0.48*  CALCIUM 9.2    Studies/Results: Ct Cervical Spine Wo Contrast  Result Date: 05/22/2016 CLINICAL DATA:  C2 fracture EXAM: CT CERVICAL SPINE WITHOUT CONTRAST TECHNIQUE: Multidetector CT imaging of the cervical spine was performed without intravenous contrast. Multiplanar CT image reconstructions were also generated. COMPARISON:  None. FINDINGS: Alignment: There are is rightward subluxation of the lateral masses of C1 on C2. There is upper cervical left convex curvature. Additionally, there is anterior subluxation of the fractured superior aspect of the dens relative to the remainder of the C2 body. Skull base and vertebrae: There is a tight 3 fracture of C2 that extends from the base of the dens into the CT vertebral body and the proximal aspect of the left articular pillar.  There is no associated retropulsion. There is no other cervical spine fracture identified. Soft tissues and spinal canal: The left vertebral artery at the level of the fracture is not clearly identified. No prevertebral fluid or swelling. No visible canal hematoma. Disc levels: No advanced spinal canal or neural foraminal stenosis. Upper chest: No pneumothorax, pulmonary nodule or pleural effusion. Other: Normal visualized paraspinal cervical soft tissues. IMPRESSION: 1. Type 3 fracture of the odontoid extending from the base of the dens into the C2 body and the left articular pillar. 2. Rightward subluxation of the C1 lateral masses relative to C2. Anterior subluxation of the superior aspect of the dens relative to the remainder of the C2 body. 3. Inadequate visualization of the left vertebral artery at level of the fracture. CTA of the neck is recommended to exclude possible vascular injury. Electronically Signed   By: Ulyses Jarred M.D.   On: 05/22/2016 22:39    Assessment/Plan: C2 fracture: The patient's CT demonstrates this fracture seems to be healing albeit a bit malaligned. I recommend he continue to wear her cervical collar. Please have him follow-up with me in the office in approximately 6 weeks. At that time I will likely flex and extend him to make sure his fracture is not unstable.  LOS: 23 days     Janely Gullickson D 05/23/2016, 11:33 AM

## 2016-05-23 NOTE — Progress Notes (Signed)
Social Work Patient ID: [name], male   DOB: 06-Sep-1960, 55 y.o.   MRN: [id number]   Have reviewed team conference info with wife and provided pt with basic information.  Wife pleased with progress and aware we continue to plan toward 11/28 d/c.  Have scheduled for wife, her sister and brother-in-law to be here tomorrow morning 9-12 to go through therapies and then will sit down with tx team for review and to answer any questions.    Brian Capek, LCSW

## 2016-05-23 NOTE — Progress Notes (Addendum)
Orthopedic Tech Progress Note Patient Details:  [name] 17-Nov-1960 [id number]  Casting Type of Cast: Short leg cast Cast Location: RLE Cast Material: Fiberglass Cast Intervention: Application, Removal  Ordered by Dr. Madelaine Etienne, Isidor Holts 05/23/2016, 5:41 PM

## 2016-05-23 NOTE — Progress Notes (Signed)
Occupational Therapy Weekly Progress Note  Patient Details  Name: [name] MRN: [id number] Date of Birth: [birth year]  Beginning of progress report period: May 16, 2016 End of progress report period: May 23, 2016  Patient has met 4 of 4 short term goals.  Pt made steady progress with STGs during this past week.  Pt performs supine<>sit EOB with min A using bed rails.  Pt is able to perform sliding board transfer with level surface with min A and max A to elevated surface.  Pt required max multimodal cues for safety awareness and sequencing during transfers. Pt attends to functional tasks upward of 30-40 mins without occasional verbal cues for redirection.  Pt completes UB bathing/dressing tasks seated EOB at supervision level.  Pt's family has not been present for education as of yet. Education sessions have been scheduled. Pt current behavior is consistent with Rancho Level 6. Patient continues to demonstrate the following deficits:muscle weakness and acute pain, decreased cardiorespiratoy endurance, decreased coordination, decreased initiation, decreased attention, decreased awareness, decreased problem solving, decreased safety awareness, decreased memory and delayed processing and decreased sitting balance, decreased postural control, decreased balance strategies and difficulty maintaining precautionsand therefore will continue to benefit from skilled OT intervention to enhance overall performance with BADL and Reduce care partner burden.  Patient progressing toward long term goals..  Continue plan of care.  OT Short Term Goals Week 3:  OT Short Term Goal 1 (Week 3): Pt will initiate threading BLE into pants with max A while seated EOB OT Short Term Goal 1 - Progress (Week 3): Met OT Short Term Goal 2 (Week 3): Pt will attend to functional task for 60 seconds with min verbal cues OT Short Term Goal 2 - Progress (Week 3): Met OT Short Term Goal 3 (Week 3): Pt will maintain  unsupported sitting balance with supervision for 5 mins to complete functional task OT Short Term Goal 3 - Progress (Week 3): Met OT Short Term Goal 4 (Week 3): Pt will perform lateral lean with max A in preparation for pulling up pants and/or performing toilet hygiene OT Short Term Goal 4 - Progress (Week 3): Met Week 4:  OT Short Term Goal 1 (Week 4): Pt will perform LB dressing with mod A at bed level OT Short Term Goal 2 (Week 4): Pt will perform sliding board transfer to drop arm BSC with mod A OT Short Term Goal 3 (Week 4): Pt will perform toileting tasks with max A seated on [facility]    Therapy Documentation Precautions:  Precautions Precautions: Fall, Cervical Precaution Comments: feeding tube, c collar at all times, left LE external fixator, right foot cast  Required Braces or Orthoses: Cervical Brace Cervical Brace: Hard collar, At all times Restrictions Weight Bearing Restrictions: Yes RLE Weight Bearing: Non weight bearing LLE Weight Bearing: Non weight bearing ASee Function Navigator for Current Functional Status.   Therapy/Group: Individual Therapy  Leroy Libman 05/23/2016, 2:49 PM

## 2016-05-23 NOTE — Progress Notes (Signed)
Occupational Therapy Session Note  Patient Details  Name: [name] MRN: [id number] Date of Birth: 1961-05-04  Today's Date: 05/23/2016 OT Individual Time: 0900-1000 OT Individual Time Calculation (min): 60 min     Short Term Goals: Week 3:  OT Short Term Goal 1 (Week 3): Pt will initiate threading BLE into pants with max A while seated EOB OT Short Term Goal 2 (Week 3): Pt will attend to functional task for 60 seconds with min verbal cues OT Short Term Goal 3 (Week 3): Pt will maintain unsupported sitting balance with supervision for 5 mins to complete functional task OT Short Term Goal 4 (Week 3): Pt will perform lateral lean in preparation for pulling up pants and/or performing toilet hygiene  Skilled Therapeutic Interventions/Progress Updates:    Pt resting in w/c upon arrival with PT present.  Pt already dressed.  Pt engaged in cognitive remediation tasks with focus on problem solving, sequencing, and attention to task.  Pt presented with moderately complex diagram of pipe structure and challenged with replicating picture.  Pt was able to complete task with min verbal cues for correcting errors.  Pt attended to task throughout session without verbal cues for redirection.  Pt completed two tasks in approx 40 mins.  Pt propelled back to nursing station and remained in w/c awaiting next therapy.  Therapy Documentation Precautions:  Precautions Precautions: Fall, Cervical Precaution Comments: feeding tube, c collar at all times, left LE external fixator, right foot cast  Required Braces or Orthoses: Cervical Brace Cervical Brace: Hard collar, At all times Restrictions Weight Bearing Restrictions: Yes RLE Weight Bearing: Non weight bearing LLE Weight Bearing: Non weight bearing General:   Vital Signs: Therapy Vitals Pulse Rate: 79 BP: 123/77 Pain: Pain Assessment Pain Assessment: No/denies pain ADL: ADL ADL Comments: see functional navigator Exercises:   Other  Treatments:    See Function Navigator for Current Functional Status.   Therapy/Group: Individual Therapy  Leroy Libman 05/23/2016, 10:01 AM

## 2016-05-23 NOTE — Progress Notes (Addendum)
Physical Therapy Session Note  Patient Details  Name: [name] MRN: [id number] Date of Birth: [birth year]  Today's Date: 05/23/2016 PT Individual Time: [id number] and 1520-1543 PT Individual Time Calculation (min): 58 min and 23 min   Short Term Goals: Week 3:  PT Short Term Goal 1 (Week 3): Pt will perform bed <> chair transfer with max A PT Short Term Goal 2 (Week 3): Pt will attend to functional task x 30 seconds with min A PT Short Term Goal 3 (Week 3): Pt will roll in bed with use of bed rails and mod A  Skilled Therapeutic Interventions/Progress Updates:    Treatment 1: Patient awake in bed upon arrival with no c/o pain, reporting fatigue and need for bowel movement. Performed rolling with min A using rail to place bed pan but was unable to void after prolonged attempt. Patient completed LB dressing tasks at bed level and initiated pulling up shorts with verbal cues to prevent bridging through BLE due to NWB precautions.  Pt rolled to R and L with min A and required assistance to pull up pants. Seated EOB, engaged in self feeding breakfast with supervision. Pt completed UB dressing tasks seated EOB before transferring to wheelchair via sliding board with initial min A to prevent LLE WB faded to mod A to safely complete transfer to chair with cues for head hips relationship and overall sequencing and technique. Patient propelled wheelchair throughout rehab unit using BUE with min cues for attending to L side of environment and maintaining straight path with supervision overall including turns to R and L > 150 ft with rest breaks as needed due to c/o being hot, provided ice pack, fan, and cold wash cloth. Patient left sitting in wheelchair with all needs in reach, handoff to OT for next session.   Treatment 2: Patient sitting EOB reporting 9/10 neck pain, RN notified. Performed slide board transfer to wheelchair with mod A to elevate BLE to maintain NWB precautions with cues for sequencing  and technique. RN present to administer pain medication. Tech from Dr. Carlean Jews office present to change patient's R cast, patient returned to bed via slide board transfer with max A x 2 due to increased pain and with increased time. Patient transferred sit > supine with supervision and left semi reclined in bed with all needs within reach and bed alarm engaged.   Therapy Documentation Precautions:  Precautions Precautions: Fall, Cervical Precaution Comments: feeding tube, c collar at all times, left LE external fixator, right foot cast  Required Braces or Orthoses: Cervical Brace Cervical Brace: Hard collar, At all times Restrictions Weight Bearing Restrictions: Yes RLE Weight Bearing: Non weight bearing LLE Weight Bearing: Non weight bearing Pain: Pain Assessment Pain Assessment: 0-10 Pain Score: 9  Pain Type: Acute pain Pain Location: Neck Pain Descriptors / Indicators: Aching Pain Onset: Progressive Pain Intervention(s): RN made aware  See Function Navigator for Current Functional Status.   Therapy/Group: Individual Therapy  Laretta Alstrom 05/23/2016, 8:58 AM

## 2016-05-23 NOTE — Progress Notes (Signed)
Speech Language Pathology Weekly Progress and Session Note  Patient Details  Name: LERRY CORDREY MRN: [id number] Date of Birth: 08-08-1960  Beginning of progress report period: May 15, 2016 End of progress report period: May 23, 2016  Today's Date: 05/23/2016 SLP Individual Time: 1030-1130 SLP Individual Time Calculation (min): 60 min   Short Term Goals: Week 3: SLP Short Term Goal 1 (Week 3): Patient will verbalize at the word level in 75% of opportunities with Mod A multimodal cues.  SLP Short Term Goal 1 - Progress (Week 3): Met SLP Short Term Goal 2 (Week 3): Patient will vocalize in 25% of opportunities with Max A multimodal cues.  SLP Short Term Goal 2 - Progress (Week 3): Met SLP Short Term Goal 3 (Week 3): Patient will demonstrate sustained attention to a functional task for 3 mins with Max A multimodal cues.  SLP Short Term Goal 3 - Progress (Week 3): Met SLP Short Term Goal 4 (Week 3): Patient will perform basic functional tasks (face washing, brushing teeth with suction toothbrush, apply chapstick, etc) with Mod A multimodal cues.  SLP Short Term Goal 4 - Progress (Week 3): Met SLP Short Term Goal 5 (Week 3): Patient will consume current diet with minimal overt s/s of aspiration with Mod A verbal cues for use of swallowing compenstory strategies.  SLP Short Term Goal 5 - Progress (Week 3): Met SLP Short Term Goal 6 (Week 3): Patient will demonstrate efficient mastication of Dys. 2 trials with minimal oral residue without overt s/s of aspiration with Mod A verbal cues over 3 consecutive sessions prior to upgrade.  SLP Short Term Goal 6 - Progress (Week 3): Met    New Short Term Goals: Week 4: SLP Short Term Goal 1 (Week 4): Patient will consume current diet without overt s/s of aspiration with Mod I for use of swallowing compensatory strategies.  SLP Short Term Goal 2 (Week 4): Patient will demonstrate efficient mastication of Dys. 3 textures with complete oral  clearance without overt s/s of aspiration with supervision verbal cues.  SLP Short Term Goal 3 (Week 4): Patient will utilize an increased vocal intensity at the phrase level to achieve ~75% intelligibility with Mod A verbal cues.  SLP Short Term Goal 4 (Week 4): Patient will demonstrate selective attention in a mildly distracting enviornment for 30 minutes with Min A verbal cues for redirection.  SLP Short Term Goal 5 (Week 4): Patient will demonstrate functional problem solving for basic and familiar tasks with Min A verbal cues.  SLP Short Term Goal 6 (Week 4): Patient will recall new, daily information with Min A verbal and visual cues.   Weekly Progress Updates: Patient has made excellent gains and has met 6 of 6 STG's this reporting period due to improved cognitive, speech and swallowing function. Currently, patient demonstrates behaviors consistent with a Rancho Level VI and requires overall Mod A to complete functional and familiar tasks safely in regards to problem solving and attention and Max A for recall and awareness. Patient is currently consuming Dys. 1 textures with thin liquids with minimal overt s/s of aspiration and is Mod I for use of swallowing compensatory strategies. Patient is consuming Dys. 2 textures with efficient mastication and will consume a trial tray tomorrow prior to upgrade. Patient with increased social interaction and verbalizations but continues to require Mod-Max A verbal cues for use of an increased vocal intensity to maximize intelligibility. Patient and family education is ongoing. Patient would benefit from continued skilled  SLP intervention to maximize his functional independence prior to discharge home.    Intensity: Minumum of 1-2 x/day, 30 to 90 minutes Frequency: 3 to 5 out of 7 days Duration/Length of Stay: 06/04/16 Treatment/Interventions: Cognitive remediation/compensation;Cueing hierarchy;Functional tasks;Patient/family education;Therapeutic  Activities;Environmental controls;Dysphagia/aspiration precaution training;Internal/external aids;Speech/Language facilitation   Daily Session  Skilled Therapeutic Interventions: Skilled treatment session focused on dysphagia and cognitive goals. SLP facilitated session by providing Mod A question cues for functional problem solving during basic and familiar task of donning his dentures. Patient consumed trials of Dys. 2 textures with efficient mastication and without overt s/s of aspiration and Dys. 3 textures with a consistent throat clear after each trial. Recommend trial tray of Dys. 2 textures prior to upgrade. Patient performed mildly complex task of navigating the Internet to find websites of interest on both a computer and tablet. Patient initially required Max-Total A multimodal cues for problem solving with the computer that faded to Min-Mod A verbal cues for problem solving with use of the tablet (more familiar to patient). Patient was ~75% intelligible at the phrase level with Mod A verbal cues for use of an increased vocal intensity. Patient left upright in wheelchair at RN station. Continue with current plan of care.       Function:   Eating Eating   Modified Consistency Diet: Yes Eating Assist Level: Swallowing techniques: self managed           Cognition Comprehension Comprehension assist level: Follows basic conversation/direction with extra time/assistive device  Expression   Expression assist level: Expresses basic 50 - 74% of the time/requires cueing 25 - 49% of the time. Needs to repeat parts of sentences.  Social Interaction Social Interaction assist level: Interacts appropriately 75 - 89% of the time - Needs redirection for appropriate language or to initiate interaction.  Problem Solving Problem solving assist level: Solves basic 50 - 74% of the time/requires cueing 25 - 49% of the time  Memory Memory assist level: Recognizes or recalls 50 - 74% of the time/requires  cueing 25 - 49% of the time   Pain Pain in neck. RN made aware and administered medications   Therapy/Group: Individual Therapy  PAYNE, COURTNEY 05/23/2016, 3:30 PM
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
## 2016-05-23 NOTE — Progress Notes (Signed)
Occupational Therapy Note  Patient Details  Name: [name] MRN: [id number] Date of Birth: 02/10/1961  Today's Date: 05/23/2016 OT Individual Time: [id number] OT Individual Time Calculation (min): 15 min  and Today's Date: 05/23/2016 OT Missed Time: 32 Minutes Missed Time Reason: Patient fatigue    Pt c/o discomfort in buttocks/sacrum; repositioned Individual Therapy  Pt resting in w/c at nursing station upon arrival.  Pt immediately stated that his "bottom" hurt and he needed to lie down in bed.  Pt had been in chair approx 5.5 hours without relief.  Pt performed sliding board transfer to bed (tot A + 2, pt=50%).  Pt assisted with repositioning in bed.  Pt remained in w/c with all needs in place and bed alarm activated.  Pt missed 45 mins OT services secondary to fatigue.    [name] [facility] 05/23/2016, 2:38 PM

## 2016-05-24 ENCOUNTER — Inpatient Hospital Stay (HOSPITAL_COMMUNITY): Payer: Self-pay | Admitting: Physical Therapy

## 2016-05-24 ENCOUNTER — Inpatient Hospital Stay (HOSPITAL_COMMUNITY): Payer: No Typology Code available for payment source

## 2016-05-24 ENCOUNTER — Inpatient Hospital Stay (HOSPITAL_COMMUNITY): Payer: No Typology Code available for payment source | Admitting: Speech Pathology

## 2016-05-24 LAB — GLUCOSE, CAPILLARY
Glucose-Capillary: 107 mg/dL — ABNORMAL HIGH (ref 65–99)
Glucose-Capillary: 113 mg/dL — ABNORMAL HIGH (ref 65–99)
Glucose-Capillary: 113 mg/dL — ABNORMAL HIGH (ref 65–99)

## 2016-05-24 MED ORDER — ACETAMINOPHEN 325 MG PO TABS: 650.0000 mg | ORAL_TABLET | Freq: Four times a day (QID) | ORAL | Status: DC | PRN

## 2016-05-24 MED ORDER — SORBITOL 70 % SOLN
30.0000 mL | Freq: Every day | Status: DC | PRN
  Filled 2016-05-24: qty 30
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
## 2016-05-24 NOTE — Progress Notes (Signed)
Occupational Therapy Session Note  Patient Details  Name: [name] MRN: [id number] Date of Birth: [birth year]  Today's Date: 05/24/2016 OT Individual Time: 0900-1030 OT Individual Time Calculation (min): 90 min     Short Term Goals: Week 4:  OT Short Term Goal 1 (Week 4): Pt will perform LB dressing with mod A at bed level OT Short Term Goal 2 (Week 4): Pt will perform sliding board transfer to drop arm BSC with mod A OT Short Term Goal 3 (Week 4): Pt will perform toileting tasks with max A seated on [facility]  Skilled Therapeutic Interventions/Progress Updates:    Focus on family/caregiver education with wife, brother-in-law, and sister-in-law.  Sliding board transfers demonstrated and return demonstrated by wife and brother-in-law. Wife also assisted with bathing/dressing tasks at bed level and seated EOB. Discussed BSC transfers/toileting and set up additional family education to practice toilet transfers.  :Pt's wife and brother-in-law safely assisted with sliding board transfers.  Pt fatigues easily and requested to return to bed after four bed<>w/c transfers and BADLs.  Therapy Documentation Precautions:  Precautions Precautions: Fall, Cervical Precaution Comments: feeding tube, c collar at all times, left LE external fixator, right foot cast  Required Braces or Orthoses: Cervical Brace Cervical Brace: Hard collar, At all times Restrictions Weight Bearing Restrictions: Yes RLE Weight Bearing: Non weight bearing LLE Weight Bearing: Non weight bearing General:   Vital Signs: Therapy Vitals Pulse Rate: 85 BP: 134/70 Pain: Pain Assessment Pain Assessment: 0-10 Pain Score: 6  Pain Type: Acute pain Pain Location: Shoulder Pain Orientation: Left Pain Descriptors / Indicators: Aching;Cramping Pain Frequency: Intermittent Pain Onset: Gradual Patients Stated Pain Goal: 4 Pain Intervention(s): Premedicated before therapy; repositioned ADL: ADL ADL Comments: see functional  navigator  See Function Navigator for Current Functional Status.   Therapy/Group: Individual Therapy  Leroy Libman 05/24/2016, 11:27 AM
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
## 2016-05-24 NOTE — Progress Notes (Signed)
Physical Therapy Session Note  Patient Details  Name: [name] MRN: [id number] Date of Birth: [birth year]  Today's Date: 05/24/2016 PT Individual Time: 1500-1600 PT Individual Time Calculation (min): 60 min    Short Term Goals: Week 3:  PT Short Term Goal 1 (Week 3): Pt will perform bed <> chair transfer with max A PT Short Term Goal 2 (Week 3): Pt will attend to functional task x 30 seconds with min A PT Short Term Goal 3 (Week 3): Pt will roll in bed with use of bed rails and mod A  Skilled Therapeutic Interventions/Progress Updates:  Pt received in bed; first half of session to focus on re-assessment of vestibular system due to evidence of nystagmus and vertigo one week ago.  Pt reporting decreased episodes and decreased severity of vertigo over the past week.  Pt agreeable to re-assessment.  Pt placed in R and then L sidelying with bed placed in reverse trendelenburg for modified Dix-Hallpike.  Pt did not report any vertigo and no nystagmus noted when in testing positions or when rolling supine <> L/R.  No treatment indicated at this time.  Second half of session to focus on family education and training with wife.  Discussed bed at home; reports pt will go home either with hospital bed or will use a lower bed at home and family will purchase a rail to slide between the mattresses.  Pt performed rolling to L side and side > sit with rail with supervision-min A.  Pt and wife performed transfers w/c <> bed with slideboard with pt and wife performing set up with mod verbal cues from therapist; wife provided min-mod A to patient to maintain NWB bilat LE while pt performed lateral scooting with verbal and visual cues from therapist for use of head-hips relationship.  Once in w/c demonstrated to wife how to don ELR and positioning of ELR.  Wife return demonstrated.  In gym pt educated on ramp negotiation in w/c; pt and wife return demonstrated but pt continued to propel self down ramp too quickly  and will need to review.  At end of session pt returned to room with wife to supervise.     Therapy Documentation Precautions:  Precautions Precautions: Fall, Cervical Precaution Comments: feeding tube, c collar at all times, left LE external fixator, right foot cast  Required Braces or Orthoses: Cervical Brace Cervical Brace: Hard collar, At all times Restrictions Weight Bearing Restrictions: Yes RLE Weight Bearing: Non weight bearing LLE Weight Bearing: Non weight bearing   See Function Navigator for Current Functional Status.   Therapy/Group: Individual Therapy  Raylene Everts [facility] 05/24/2016, 5:03 PM

## 2016-05-25 LAB — GLUCOSE, CAPILLARY
Glucose-Capillary: 111 mg/dL — ABNORMAL HIGH (ref 65–99)
Glucose-Capillary: 113 mg/dL — ABNORMAL HIGH (ref 65–99)
Glucose-Capillary: 116 mg/dL — ABNORMAL HIGH (ref 65–99)
Glucose-Capillary: 98 mg/dL (ref 65–99)
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
## 2016-05-26 LAB — GLUCOSE, CAPILLARY
Glucose-Capillary: 103 mg/dL — ABNORMAL HIGH (ref 65–99)
Glucose-Capillary: 129 mg/dL — ABNORMAL HIGH (ref 65–99)
Glucose-Capillary: 133 mg/dL — ABNORMAL HIGH (ref 65–99)
Glucose-Capillary: 95 mg/dL (ref 65–99)

## 2016-05-26 MED ORDER — FREE WATER
50.0000 mL | Freq: Every day | Status: DC
  Administered 2016-05-27: 50 mL
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
## 2016-05-27 ENCOUNTER — Inpatient Hospital Stay (HOSPITAL_COMMUNITY): Payer: Self-pay | Admitting: Physical Therapy

## 2016-05-27 ENCOUNTER — Inpatient Hospital Stay (HOSPITAL_COMMUNITY): Payer: No Typology Code available for payment source

## 2016-05-27 ENCOUNTER — Inpatient Hospital Stay (HOSPITAL_COMMUNITY): Payer: No Typology Code available for payment source | Admitting: Speech Pathology

## 2016-05-27 ENCOUNTER — Inpatient Hospital Stay (HOSPITAL_COMMUNITY): Payer: No Typology Code available for payment source | Admitting: Physical Therapy

## 2016-05-27 ENCOUNTER — Encounter (HOSPITAL_COMMUNITY): Payer: Self-pay

## 2016-05-27 DIAGNOSIS — S069X3A Unspecified intracranial injury with loss of consciousness of 1 hour to 5 hours 59 minutes, initial encounter: Secondary | ICD-10-CM

## 2016-05-27 LAB — GLUCOSE, CAPILLARY
Glucose-Capillary: 124 mg/dL — ABNORMAL HIGH (ref 65–99)
Glucose-Capillary: 130 mg/dL — ABNORMAL HIGH (ref 65–99)

## 2016-05-27 NOTE — Progress Notes (Signed)
Speech Language Pathology Daily Session Note  Patient Details  Name: AKHIL EDELL MRN: [id number] Date of Birth: 04/11/1961  Today's Date: 05/27/2016 SLP Individual Time: 1000-1100 SLP Individual Time Calculation (min): 60 min  SLP Individual Time: [id number] SLP Individual Time Calculation (min): 55 min   Short Term Goals: Week 4: SLP Short Term Goal 1 (Week 4): Patient will consume current diet without overt s/s of aspiration with Mod I for use of swallowing compensatory strategies.  SLP Short Term Goal 2 (Week 4): Patient will demonstrate efficient mastication of Dys. 3 textures with complete oral clearance without overt s/s of aspiration with supervision verbal cues.  SLP Short Term Goal 3 (Week 4): Patient will utilize an increased vocal intensity at the phrase level to achieve ~75% intelligibility with Mod A verbal cues.  SLP Short Term Goal 4 (Week 4): Patient will demonstrate selective attention in a mildly distracting enviornment for 30 minutes with Min A verbal cues for redirection.  SLP Short Term Goal 5 (Week 4): Patient will demonstrate functional problem solving for basic and familiar tasks with Min A verbal cues.  SLP Short Term Goal 6 (Week 4): Patient will recall new, daily information with Min A verbal and visual cues.   Skilled Therapeutic Interventions:  Session 1: Skilled therapy intervention focused on cognitive goals. Throughout session, patient complained of acute pain and exhibited difficulty focusing on mildly complex functional task, requiring Mod A verbal and visual cues for problem solving and attention. RN made aware of patient's discomfort and patient was given medication. Patient handed off to PT. Continue current plan of care.     Session 2: Skilled therapy intervention focused on cognitive and dysphagia goals. Patient was given trials of regular textures, independently utilizing compensatory strategies of alternating bites with sips of thin liquid via cup; no  overt s/s of aspiration noted. Will continue trials of regular textures next session to determine readiness for diet upgrade. Given a short term memory task, patient required Mod A verbal cues to recall therapy schedule. Patient left upright in bed with bed alarm on and all needs within reach. Continue current plan of care.   Function:  Eating Eating   Modified Consistency Diet: Yes Eating Assist Level: Swallowing techniques: self managed           Cognition Comprehension Comprehension assist level: Follows basic conversation/direction with extra time/assistive device  Expression   Expression assist level: Expresses basic 75 - 89% of the time/requires cueing 10 - 24% of the time. Needs helper to occlude trach/needs to repeat words.  Social Interaction Social Interaction assist level: Interacts appropriately 90% of the time - Needs monitoring or encouragement for participation or interaction.  Problem Solving Problem solving assist level: Solves basic 50 - 74% of the time/requires cueing 25 - 49% of the time  Memory Memory assist level: Recognizes or recalls 50 - 74% of the time/requires cueing 25 - 49% of the time    Pain Pain Assessment Pain Score: 7  Faces Pain Scale: Hurts whole lot Pain Type: Acute pain Pain Location: Neck Pain Orientation: Left Pain Descriptors / Indicators: Stabbing Pain Intervention(s): Medication (See eMAR)  Therapy/Group: Individual Therapy  Thornton Papas 05/27/2016, 4:00 PM
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
## 2016-05-27 NOTE — Progress Notes (Addendum)
Physical Therapy Weekly Progress Note  Patient Details  Name: [name] MRN: [id number] Date of Birth: May 31, 1961  Beginning of progress report period: May 16, 2016 End of progress report period: May 27, 2016  Today's Date: 05/27/2016 PT Individual Time: 1100-1204 PT Individual Time Calculation (min): 64 min    Patient has met 3 of 3 short term goals.  Pt is making good progress with functional mobility, attention to tasks, and problem solving. Pt is now able to maintain sustained attention consistently in moderately distracting environment. Pt demonstrates intellectual awareness with min cuing, and is slowly progressing towards emergent awareness with more cuing. Pt is demonstrating behaviors consistent with [place].   Family/caregiver training has been initiated but pt & family will benefit from further education to maximize safety with all functional mobility to prepare for d/c home.   Patient continues to demonstrate the following deficits: decreased safety awareness, decreased problem solving, decreased orientation, decreased ability to complete transfers & bed mobility while maintaining NWB BLE, decreased ability to propel w/c and therefore will continue to benefit from skilled PT intervention to enhance overall performance with activity tolerance, balance, postural control, ability to compensate for deficits, attention, awareness, coordination, knowledge of precautions and pt/caregiver training.  Pt continues to be limited by pain in L shoulder & neck.   Patient progressing toward long term goals..  Long term goals upgraded as appropriate; continue plan of care.   PT Short Term Goals Week 3:  PT Short Term Goal 1 (Week 3): Pt will perform bed <> chair transfer with max A PT Short Term Goal 1 - Progress (Week 3): Met (min/mod assist +1) PT Short Term Goal 2 (Week 3): Pt will attend to functional task x 30 seconds with min A PT Short Term Goal 2 - Progress (Week 3):  Met PT Short Term Goal 3 (Week 3): Pt will roll in bed with use of bed rails and mod A PT Short Term Goal 3 - Progress (Week 3): Met Week 4:  PT Short Term Goal 1 (Week 4): Pt will demonstrate emergent awareness with min cuing. PT Short Term Goal 2 (Week 4): Pt will consistently perform slide board transfers with min assist +1. PT Short Term Goal 3 (Week 4): Pt will consistently perform bed mobility (regular bed) with supervision.  PT Short Term Goal 4 (Week 4): Pt will safely negotiate ramp in w/c with min assist.   Skilled Therapeutic Interventions/Progress Updates:    Pt received in w/c in handoff from SLP; pt & SLP noted significant pain in pt's L shoulder & neck but premedicated. Pt given choice of two activities to complete during session & selected car transfer. Provided pt with total assist for w/c & slide board set up, mod assist to maintain NWB BLE and max cuing for sequencing, safety & technique. During middle of transfer pt noted significant pain and inability to complete task; pt returned to chair. Transported pt to room & pt completed slide board transfer w/c>bed with mod assist and max cuing for anterior weight shift to assist with head/hips relationship but poor demo by pt. Pt transferred sit>supine in bed with supervision assist. Focused on cognitive remediation & problem solving with pt assembling pipe tree shapes with pt completing first shape with extra time but supervision overall. Number of pieces required for remaining shapes increased & pt required questioning cuing to identify errors & select pieces in mild to highly distracting environment. Pt noted pain with this task & RN made aware.  Pt performed BLE straight leg raises & hip abduction with AAROM LLE & multimodal cuing for technique. At end of session pt left in bed with alarm set & all needs within reach.  Pt left without BUE mittens on; discussed this with pt's primary therapy team and willing to try pt without mittens on  while in bed & RN made aware. Educated pt on need to keep cervical collar on at all times.   Throughout session encouraged pt to speak in a louder tone, as pt continues to speak in a whisper majority of time.   Therapy Documentation Precautions:  Precautions Precautions: Fall, Cervical Precaution Comments: feeding tube, c collar at all times, left LE external fixator, right foot cast  Required Braces or Orthoses: Cervical Brace Cervical Brace: Hard collar, At all times Restrictions Weight Bearing Restrictions: Yes RLE Weight Bearing: Non weight bearing LLE Weight Bearing: Non weight bearing   See Function Navigator for Current Functional Status.  Therapy/Group: Individual Therapy  [name] 05/27/2016, 12:15 PM

## 2016-05-27 NOTE — Progress Notes (Signed)
Patient with complaints of (L) shoulder and pectoral area of sharp pain. Noted previous nurse administered Tramadol and Vicodin this am, also an ice pack in place. Notified Dan A. PA of patient's continued pain and interventions used. Given order to use heat instead of ice for pain of (L) shoulder and pectoral area. Will continue to monitor patient. 

## 2016-05-27 NOTE — Plan of Care (Signed)
Problem: RH Bed Mobility Goal: LTG Patient will perform bed mobility with assist (PT) LTG: Patient will perform bed mobility with assistance, with/without cues (PT).  Upgrade 2/2 progress  Problem: RH Attention Goal: LTG Patient will demonstrate focused/sustained (PT) LTG:  Patient will demonstrate focused/sustained/selective/alternating/divided attention during functional mobility in specific environment with assist for # of minutes  (PT)  3 minutes; upgrade 2/2 progress

## 2016-05-27 NOTE — Progress Notes (Signed)
Physical Therapy Session Note  Patient Details  Name: [name] MRN: [id number] Date of Birth: 12-11-1960  Today's Date: 05/27/2016 PT Individual Time: [id number] PT Individual Time Calculation (min): 24 min    Short Term Goals: Week 4:  PT Short Term Goal 1 (Week 4): Pt will demonstrate emergent awareness with min cuing. PT Short Term Goal 2 (Week 4): Pt will consistently perform slide board transfers with min assist +1. PT Short Term Goal 3 (Week 4): Pt will consistently perform bed mobility (regular bed) with supervision.  PT Short Term Goal 4 (Week 4): Pt will safely negotiate ramp in w/c with min assist.  Skilled Therapeutic Interventions/Progress Updates:  Upon arrival pt noted to be sitting up EOB on R side attempting to get OOB on his own. Pt stating he was "bored" and wanted to get up.  Pt agreeable to get into recliner.   Recliner set up to L; pt initiated slideboard transfer to L with mod A to maintain trunk position, weight shifting laterally in order to advance hips across board.  Pt's hips became caught on arm rest and before therapist able to help guide pt's hips, pt scooted his hips forwards and off of the board.  Pt caught himself with UE and therapist caught pt under hips.  Attempted to boost pt back onto bed with pt's UE only but pt did push through bilat LE.  RN notified.  Transitioned recliner around to R side with arm rest lowered and performed slideboard into recliner with mod A to maintain NWB.  Pt set up in recliner with UE supported while therapist applied kinesiotape to L shoulder for support and pain management.  Pt left in recliner with LE elevated, UE supported and quick release belt in place.     Therapy Documentation Precautions:  Precautions Precautions: Fall, Cervical Precaution Comments: feeding tube, c collar at all times, left LE external fixator, right foot cast  Required Braces or Orthoses: Cervical Brace Cervical Brace: Hard collar, At all  times Restrictions Weight Bearing Restrictions: Yes RLE Weight Bearing: Non weight bearing LLE Weight Bearing: Non weight bearing Vital Signs: Therapy Vitals Temp: 98.1 F (36.7 C) Temp Source: Oral Pulse Rate: 83 Resp: 18 BP: 130/79 Patient Position (if appropriate): Lying Oxygen Therapy SpO2: 97 % O2 Device: Not Delivered Pain: Pain Assessment Faces Pain Scale: Hurts whole lot Pain Type: Acute pain Pain Location: Neck Pain Orientation: Left Pain Descriptors / Indicators: Stabbing Pain Intervention(s): Medication (See eMAR)   See Function Navigator for Current Functional Status.   Therapy/Group: Individual Therapy  Raylene Everts [facility] 05/27/2016, 4:46 PM
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
## 2016-05-27 NOTE — Progress Notes (Signed)
Occupational Therapy Session Note  Patient Details  Name: [name] MRN: [id number] Date of Birth: 1960/12/17  Today's Date: 05/27/2016 OT Individual Time: 0800-0900 OT Individual Time Calculation (min): 60 min     Short Term Goals: Week 4:  OT Short Term Goal 1 (Week 4): Pt will perform LB dressing with mod A at bed level OT Short Term Goal 2 (Week 4): Pt will perform sliding board transfer to drop arm BSC with mod A OT Short Term Goal 3 (Week 4): Pt will perform toileting tasks with max A seated on [facility]  Skilled Therapeutic Interventions/Progress Updates:    Pt sitting EOB upon arrival and agreeable to therapy.  Pt performed UB dressing tasks seated EOB and lay back onto bed to complete LB dressing tasks before sitting EOB to eat breakfast.  Pt required min verbal cues for portion mgmt and rate.  Pt performed sliding board transfer to w/c with min A and completed grooming tasks at sink.  Pt transitioned to gym and engaged in table tasks with colored peg board.  Pt completed moderately complex diagram with extra time.  Pt self corrected errors independently.  Pt remained in w/c and returned to nursing station to await next therapy.  Pt c/o ongoing pain in L shoulder.  RN aware and meds admins.  Therapy Documentation Precautions:  Precautions Precautions: Fall, Cervical Precaution Comments: feeding tube, c collar at all times, left LE external fixator, right foot cast  Required Braces or Orthoses: Cervical Brace Cervical Brace: Hard collar, At all times Restrictions Weight Bearing Restrictions: Yes RLE Weight Bearing: Non weight bearing LLE Weight Bearing: Non weight bearing General:   Vital Signs:   Pain: Pain Assessment Pain Assessment: 0-10 Pain Score: 7  Pain Type: Acute pain Pain Location: Neck Pain Orientation: Posterior Pain Descriptors / Indicators: Aching Pain Frequency: Intermittent Pain Onset: Gradual Patients Stated Pain Goal: 2 Pain Intervention(s):  Medication (See eMAR) Multiple Pain Sites: No ADL: ADL ADL Comments: see functional navigator Exercises:   Other Treatments:    See Function Navigator for Current Functional Status.   Therapy/Group: Individual Therapy  Leroy Libman 05/27/2016, 11:02 AM

## 2016-05-28 ENCOUNTER — Inpatient Hospital Stay (HOSPITAL_COMMUNITY): Payer: No Typology Code available for payment source | Admitting: *Deleted

## 2016-05-28 ENCOUNTER — Inpatient Hospital Stay (HOSPITAL_COMMUNITY): Payer: Self-pay | Admitting: Physical Therapy

## 2016-05-28 ENCOUNTER — Inpatient Hospital Stay (HOSPITAL_COMMUNITY): Payer: No Typology Code available for payment source | Admitting: Speech Pathology

## 2016-05-28 ENCOUNTER — Inpatient Hospital Stay (HOSPITAL_COMMUNITY): Payer: No Typology Code available for payment source | Admitting: Physical Therapy
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
## 2016-05-28 NOTE — Progress Notes (Addendum)
Speech Language Pathology Daily Session Note  Patient Details  Name: [name] MRN: [id number] Date of Birth: 18-Dec-1960  Today's Date: 05/28/2016 SLP Individual Time: 1303-1400 SLP Individual Time Calculation (min): 57 min   Short Term Goals: Week 4: SLP Short Term Goal 1 (Week 4): Patient will consume current diet without overt s/s of aspiration with Mod I for use of swallowing compensatory strategies.  SLP Short Term Goal 2 (Week 4): Patient will demonstrate efficient mastication of Dys. 3 textures with complete oral clearance without overt s/s of aspiration with supervision verbal cues.  SLP Short Term Goal 3 (Week 4): Patient will utilize an increased vocal intensity at the phrase level to achieve ~75% intelligibility with Mod A verbal cues.  SLP Short Term Goal 4 (Week 4): Patient will demonstrate selective attention in a mildly distracting enviornment for 30 minutes with Min A verbal cues for redirection.  SLP Short Term Goal 5 (Week 4): Patient will demonstrate functional problem solving for basic and familiar tasks with Min A verbal cues.  SLP Short Term Goal 6 (Week 4): Patient will recall new, daily information with Min A verbal and visual cues.   Skilled Therapeutic Interventions: Skilled treatment session focused on addressing dysphagia and cognitive goals. SLP facilitated session with lunch tray set-up of Dys.3 textures and thin liquids via cup.  Patient required Min verbal cues initiate self-feeding as well as Supervision level verbal cues to select attention to for 30 minutes to meal.  Patient consumed Dys.3 textures and thin liquids with efficient mastication and oral clearance as well as no overt s/s of aspiration.  Patient reported needing to go to the bathroom and as a result, patient was transferred back to bed via [facility] with Min assist verbal cues for sequencing and problem solving; however, upon return to bed patient reported that he no longer needed to go to the  bathroom.  Continue with diet upgrade and signs updated to reflect change in plan of care.     Function:  Eating Eating   Modified Consistency Diet: Yes Eating Assist Level: Set up assist for;Supervision or verbal cues   Eating Set Up Assist For: Opening containers;Cutting food       Cognition Comprehension Comprehension assist level: Follows basic conversation/direction with extra time/assistive device  Expression   Expression assist level: Expresses basic 50 - 74% of the time/requires cueing 25 - 49% of the time. Needs to repeat parts of sentences.  Social Interaction Social Interaction assist level: Interacts appropriately 50 - 74% of the time - May be physically or verbally inappropriate.  Problem Solving Problem solving assist level: Solves basic 25 - 49% of the time - needs direction more than half the time to initiate, plan or complete simple activities  Memory Memory assist level: Recognizes or recalls 50 - 74% of the time/requires cueing 25 - 49% of the time    Pain Pain Assessment Pain Assessment: 0-10 Pain Score: 8  Pain Type: Acute pain Pain Location: Neck Pain Orientation: Left Pain Descriptors / Indicators: Throbbing Pain Onset: On-going Patients Stated Pain Goal: 2 Pain Intervention(s): RN made aware;Other (Comment) (meds administered ) Multiple Pain Sites: No  Therapy/Group: Individual Therapy  Carmelia Roller., CCC-SLP [id number]  [place] 05/28/2016, 2:40 PM

## 2016-05-28 NOTE — Progress Notes (Signed)
Physical Therapy Note  Patient Details  Name: [name] MRN: [id number] Date of Birth: [birth year] Today's Date: 05/28/2016    Time: 1400-1414 14 minutes  1:1 No c/o pain, pt c/o fatigue from being up "all day".  Pt refuses to participate in PT session doing therex or edge of bed activity. PT measured pt to confirm measurements for home w/c. Pt will need 20 x18 w/c for best fit.  Primary team made aware.   Dazja Houchin 05/28/2016, 2:15 PM

## 2016-05-28 NOTE — Progress Notes (Signed)
Occupational Therapy Session Note  Patient Details  Name: [name] MRN: [id number] Date of Birth: 1961-04-03  Today's Date: 05/28/2016 OT Individual Time: 0800-0900 OT Individual Time Calculation (min): 60 min     Short Term Goals: Week 4:  OT Short Term Goal 1 (Week 4): Pt will perform LB dressing with mod A at bed level OT Short Term Goal 2 (Week 4): Pt will perform sliding board transfer to drop arm BSC with mod A OT Short Term Goal 3 (Week 4): Pt will perform toileting tasks with max A seated on [facility]  Skilled Therapeutic Interventions/Progress Updates:    Pt resting in bed upon arrival with RN present admin meds.  Pt sat EOB to thread pants over BLE with assistance.  Pt attempted to pull up pants with lateral leans but L shoulder pain limited ability to complete task.  Pt returned to supine to complete task.  Pt completed UB dressing tasks seated EOB before transferring to w/c with sliding board (min A). Pt transitioned to table tasks with PVC pipes.  Pt challenged to construct moderately difficult structure.  Pt required max verbal cues to complete task correctly. Pt requested to return to bed because his "underpants were in a wad." Pt completed sliding board transfer with mod A w/c>bed and min A for bed>w/c. Focus on activity tolerance, sitting balance, bed mobility, functional transfers, problem solving, attention to task, and safety awareness to increase independence with BADLs.  Therapy Documentation Precautions:  Precautions Precautions: Fall, Cervical Precaution Comments: feeding tube, c collar at all times, left LE external fixator, right foot cast  Required Braces or Orthoses: Cervical Brace Cervical Brace: Hard collar, At all times Restrictions Weight Bearing Restrictions: Yes RLE Weight Bearing: Non weight bearing LLE Weight Bearing: Non weight bearing General:   Vital Signs:   Pain:   ADL: ADL ADL Comments: see functional navigator Exercises:   Other  Treatments:    See Function Navigator for Current Functional Status.   Therapy/Group: Individual Therapy  Leroy Libman 05/28/2016, 10:32 AM

## 2016-05-28 NOTE — Progress Notes (Signed)
Speech Language Pathology Daily Session Note  Patient Details  Name: [name] MRN: [id number] Date of Birth: [birth year]  Today's Date: 05/28/2016 SLP Individual Time: 0930-1030 SLP Individual Time Calculation (min): 60 min   Short Term Goals: Week 4: SLP Short Term Goal 1 (Week 4): Patient will consume current diet without overt s/s of aspiration with Mod I for use of swallowing compensatory strategies.  SLP Short Term Goal 2 (Week 4): Patient will demonstrate efficient mastication of Dys. 3 textures with complete oral clearance without overt s/s of aspiration with supervision verbal cues.  SLP Short Term Goal 3 (Week 4): Patient will utilize an increased vocal intensity at the phrase level to achieve ~75% intelligibility with Mod A verbal cues.  SLP Short Term Goal 4 (Week 4): Patient will demonstrate selective attention in a mildly distracting enviornment for 30 minutes with Min A verbal cues for redirection.  SLP Short Term Goal 5 (Week 4): Patient will demonstrate functional problem solving for basic and familiar tasks with Min A verbal cues.  SLP Short Term Goal 6 (Week 4): Patient will recall new, daily information with Min A verbal and visual cues.   Skilled Therapeutic Interventions:Skilled therapy intervention focused on cognitive and dysphagia goals. Patient given snack trial of Dys. 3 textures and thin liquids with overt s/s of aspiration in the form of delayed cough x1 after thin liquid intake, suspect due to mixed consistencies. Recommend upgrade trial tray of Dys. 3 textures prior to upgrade. Given a short term memory task, patient required Mod A verbal cues for association-based memory strategy and Max A verbal cues for problem solving of repetitive task procedures. Patient voiced in about 10% of given opportunities given Mod A verbal cues. Patient expressed frustration with task, and therapist explained the purpose of the task, which was to address memory and voicing. Following  explanation, patient expressed understanding and engaged more fully in the memory task. At the end of session, patient complained of neck pain' RN made aware and medications to be administered. Patient left upright in wheelchair at nurse's station with staff supervision. Continue current plan of care.    Function:  Eating Eating   Modified Consistency Diet: Yes Eating Assist Level: No help, No cues   Eating Set Up Assist For: Opening containers       Cognition Comprehension Comprehension assist level: Follows basic conversation/direction with extra time/assistive device  Expression   Expression assist level: Expresses basic 50 - 74% of the time/requires cueing 25 - 49% of the time. Needs to repeat parts of sentences.  Social Interaction Social Interaction assist level: Interacts appropriately 50 - 74% of the time - May be physically or verbally inappropriate.  Problem Solving Problem solving assist level: Solves basic 25 - 49% of the time - needs direction more than half the time to initiate, plan or complete simple activities  Memory Memory assist level: Recognizes or recalls 50 - 74% of the time/requires cueing 25 - 49% of the time    Pain Pain Assessment Pain Assessment: 0-10 Pain Score: 8  Pain Type: Acute pain Pain Location: Neck Pain Intervention(s): RN made aware;Medication (See eMAR) Multiple Pain Sites: No  Therapy/Group: Individual Therapy  Thornton Papas 05/28/2016, 2:22 PM

## 2016-05-28 NOTE — Progress Notes (Signed)
Physical Therapy Session Note  Patient Details  Name: [name] MRN: [id number] Date of Birth: 1960/12/22  Today's Date: 05/28/2016 PT Individual Time: 1100-1200 PT Individual Time Calculation (min): 60 min    Short Term Goals: Week 4:  PT Short Term Goal 1 (Week 4): Pt will demonstrate emergent awareness with min cuing. PT Short Term Goal 2 (Week 4): Pt will consistently perform slide board transfers with min assist +1. PT Short Term Goal 3 (Week 4): Pt will consistently perform bed mobility (regular bed) with supervision.  PT Short Term Goal 4 (Week 4): Pt will safely negotiate ramp in w/c with min assist.  Skilled Therapeutic Interventions/Progress Updates:    Pt received in w/c & agreeable to tx, noting back, buttock, head & shoulder pain but RN reports she has already administered pain medication. Session focused on car transfer to low seat height with therapist providing max multimodal cuing for sequencing, NWB BLE, hand placement and head/hips relationship with fair/poor demo by pt. Pt required max assist to complete slide board transfer w/c<>car as pt is limited by L shoulder pain. Pt propelled w/c x 50 ft + 60 ft with supervision overall and cuing for obstacle avoidance on L, but pt able to propel through narrow doorway. Pt with difficulty reaching back with LUE to create a bigger push due to shoulder pain. Remainder of session focused on cognitive remediation. Pt assembled pipe tree (small pipes) with extra time and moderate cuing to select appropriate sized pieces & for overall problem solving for most simple shape. At end of session pt left sitting in w/c with QRB in place & at nurses station.   Therapy Documentation Precautions:  Precautions Precautions: Fall, Cervical Precaution Comments: feeding tube, c collar at all times, left LE external fixator, right foot cast  Required Braces or Orthoses: Cervical Brace Cervical Brace: Hard collar, At all times Restrictions Weight  Bearing Restrictions: Yes RLE Weight Bearing: Non weight bearing LLE Weight Bearing: Non weight bearing   See Function Navigator for Current Functional Status.   Therapy/Group: Individual Therapy  [name] 05/28/2016, 12:22 PM
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
## 2016-05-29 ENCOUNTER — Inpatient Hospital Stay (HOSPITAL_COMMUNITY): Payer: No Typology Code available for payment source | Admitting: Physical Therapy

## 2016-05-29 ENCOUNTER — Inpatient Hospital Stay (HOSPITAL_COMMUNITY): Payer: Self-pay

## 2016-05-29 ENCOUNTER — Inpatient Hospital Stay (HOSPITAL_COMMUNITY): Payer: No Typology Code available for payment source | Admitting: Speech Pathology

## 2016-05-29 ENCOUNTER — Inpatient Hospital Stay (HOSPITAL_COMMUNITY): Payer: No Typology Code available for payment source | Admitting: *Deleted

## 2016-05-29 DIAGNOSIS — M25512 Pain in left shoulder: Secondary | ICD-10-CM

## 2016-05-29 LAB — GLUCOSE, CAPILLARY: Glucose-Capillary: 136 mg/dL — ABNORMAL HIGH (ref 65–99)

## 2016-05-29 NOTE — Progress Notes (Signed)
Occupational Therapy Weekly Progress Note  Patient Details  Name: [name] MRN: [id number] Date of Birth: 04/07/1961  Beginning of progress report period: May 23, 2016 End of progress report period: May 29, 2016  Patient has met 3 of 3 short term goals.  Pt continues to make steady progress with BADLs.  Pt performs sliding board transfers with min A and completed UB bathing/dressing tasks seated EOB or in w/c at supervision level.  Pt continues to require min verbal cues for safety awareness.  Pt attends to functional tasks for approx 30 mins in minimally distracting envirionment.  Pt exhibits difficulty with lateral leans to his left secondary to increased pain in L shoulder.  Pt is able to doff pants seated EOB or on BSC but requires assistance with hygiene and pulling up pants. MD aware of shoulder pain.    Patient continues to demonstrate the following deficits: decr awareness, functional problem solving, memory, generalized weakness, acute pain and decr selective attention and therefore will continue to benefit from skilled OT intervention to enhance overall performance with BADL and Reduce care partner burden.  Patient progressing toward long term goals..  Continue plan of care.  OT Short Term Goals Week 4:  OT Short Term Goal 1 (Week 4): Pt will perform LB dressing with mod A at bed level OT Short Term Goal 1 - Progress (Week 4): Met OT Short Term Goal 2 (Week 4): Pt will perform sliding board transfer to drop arm BSC with mod A OT Short Term Goal 2 - Progress (Week 4): Met OT Short Term Goal 3 (Week 4): Pt will perform toileting tasks with max A seated on BSC OT Short Term Goal 3 - Progress (Week 4): Met Week 5:  OT Short Term Goal 1 (Week 5): STG=LTG secondary to ELOS       Therapy Documentation Precautions: Precautions Precautions: Fall, Cervical Precaution Comments: feeding tube, c collar at all times, left LE external fixator, right foot cast  Required Braces  or Orthoses: Cervical Brace Cervical Brace: Hard collar, At all times Restrictions Weight Bearing Restrictions: Yes RLE Weight Bearing: Non weight bearing LLE Weight Bearing: Non weight bearing  See Function Navigator for Current Functional Status.    [name] [facility] 05/29/2016, 11:36 AM
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
## 2016-05-29 NOTE — Progress Notes (Signed)
Social Work Patient ID: [name], male   DOB: 06-30-1961, 55 y.o.   MRN: [id number]   Have reviewed team conference with pt's wife and pt.  All ready for d/c early next week.  Wife planning to be here again this Friday for final family education including true car transfers.  No concerns at this time.  Joanathan Affeldt, LCSW

## 2016-05-29 NOTE — Progress Notes (Signed)
Speech Language Pathology Daily Session Notes  Patient Details  Name: [name] MRN: [id number] Date of Birth: 11-22-1960  Today's Date: 05/29/2016 SLP Individual Time: 1300-1355 SLP Individual Time Calculation (min): 55 min  SLP Individual Time: [id number] SLP Individual Time Calculation (min): 55 min  Short Term Goals: Week 5: SLP Short Term Goal 1 (Week 5): Patient will recall new, daily information with Min A verbal and visual cues.  SLP Short Term Goal 2 (Week 5): Patient will demonstrate functional problem solving for basic and familiar tasks with supervision verbal cues.  SLP Short Term Goal 3 (Week 5): Patient will utilize an increased vocal intensity at the phrase level to achieve ~75% intelligibility with Mod A verbal cues.  SLP Short Term Goal 4 (Week 5): Patient will demonstrate efficient mastication of regular textures with complete oral clearance without overt s/s of aspiration with supervision verbal cues.   Skilled Therapeutic Interventions:  Session 1: Skilled therapy intervention focused on cognitive and dysphagia goals. Patient showed intellectual awareness of c-collar, bilateral leg cast, and dysphagia precautions with about 50% accuracy, requiring Mod A verbal cues for problem-solving of safety needs post-discharge. Patient was able to voice in 10% of given opportunities, despite Mod A verbal cues for increased volume. Given a trial of thin liquids via straw, patient was independent for use of rate and portion control compensatory strategies, with no s/s of aspiration. Recommend unrestricted use of straws per patient preference. Patient left upright in bed with bed alarm on and all needs within reach. Continue current plan of care.    Session 2: Skilled therapy intervention focused on speech and dysphagia goals. Given supervision verbal cues for volume, patient voiced in 75% of given opportunities while discussing biographical information. Patient observed with lunch meal  tray of Dys. 3 textures and thin liquids via cup with no s/s of aspiration noted. Patient requested use of the bedpan, but was unsuccessful; suspect patient's request to use bedpan was attempt to shorten therapy activity time. Patient left upright in bed with bed alarm on and all needs within reach. Continue current plan of care.    Function:  Eating Eating   Modified Consistency Diet: Yes Eating Assist Level: Swallowing techniques: self managed           Cognition Comprehension Comprehension assist level: Follows basic conversation/direction with no assist  Expression   Expression assist level: Expresses basic 75 - 89% of the time/requires cueing 10 - 24% of the time. Needs helper to occlude trach/needs to repeat words.  Social Interaction Social Interaction assist level: Interacts appropriately 75 - 89% of the time - Needs redirection for appropriate language or to initiate interaction.  Problem Solving Problem solving assist level: Solves basic 50 - 74% of the time/requires cueing 25 - 49% of the time  Memory Memory assist level: Recognizes or recalls 50 - 74% of the time/requires cueing 25 - 49% of the time    Pain Pain Assessment Pain Assessment: 0-10 Pain Score: 7  Pain Location: Shoulder Pain Orientation: Right;Left Pain Descriptors / Indicators: Aching Pain Onset: On-going Patients Stated Pain Goal: 6 Pain Intervention(s): Medication (See eMAR);Repositioned  Therapy/Group: Individual Therapy  Thornton Papas 05/29/2016, 3:04 PM

## 2016-05-29 NOTE — Progress Notes (Signed)
Speech Language Pathology Weekly Progress Note  Patient Details  Name: [name] MRN: [id number] Date of Birth: [birth year]  Beginning of progress report period: May 23, 2016 End of progress report period: May 29, 2016   Short Term Goals: Week 4: SLP Short Term Goal 1 (Week 4): Patient will consume current diet without overt s/s of aspiration with Mod I for use of swallowing compensatory strategies.  SLP Short Term Goal 1 - Progress (Week 4): Met SLP Short Term Goal 2 (Week 4): Patient will demonstrate efficient mastication of Dys. 3 textures with complete oral clearance without overt s/s of aspiration with supervision verbal cues.  SLP Short Term Goal 2 - Progress (Week 4): Met SLP Short Term Goal 3 (Week 4): Patient will utilize an increased vocal intensity at the phrase level to achieve ~75% intelligibility with Mod A verbal cues.  SLP Short Term Goal 3 - Progress (Week 4): Not met SLP Short Term Goal 4 (Week 4): Patient will demonstrate selective attention in a mildly distracting enviornment for 30 minutes with Min A verbal cues for redirection.  SLP Short Term Goal 4 - Progress (Week 4): Met SLP Short Term Goal 5 (Week 4): Patient will demonstrate functional problem solving for basic and familiar tasks with Min A verbal cues.  SLP Short Term Goal 5 - Progress (Week 4): Met SLP Short Term Goal 6 (Week 4): Patient will recall new, daily information with Min A verbal and visual cues.  SLP Short Term Goal 6 - Progress (Week 4): Not met    New Short Term Goals: Week 5: SLP Short Term Goal 1 (Week 5): Patient will recall new, daily information with Min A verbal and visual cues.  SLP Short Term Goal 2 (Week 5): Patient will demonstrate functional problem solving for basic and familiar tasks with supervision verbal cues.  SLP Short Term Goal 3 (Week 5): Patient will utilize an increased vocal intensity at the phrase level to achieve ~75% intelligibility with Mod A verbal cues.   SLP Short Term Goal 4 (Week 5): Patient will demonstrate efficient mastication of regular textures with complete oral clearance without overt s/s of aspiration with supervision verbal cues.   Weekly Progress Updates: Patient has made functional gains and has met 4 of 6 STG's this reporting period due to improved swallowing and cognitive function. Currently, patient is consuming Dys. 3 textures with thin liquids with minimal overt s/s of aspiration and is overall Mod I for use of swallowing compensatory strategies. Plan for possible upgrade to regular textures prior to d/c. Patient is currently demonstrating behaviors consistent with a Rancho Level VI-emerging VII and requires overall Min-Mod A multimodal cues to complete functional and familiar tasks safely in regards to problem solving, attention and recall. Patient continues to require overall Mod-Max A for anticapatory awareness.Patient also requires overall Max A multimodal cues for use of an increased vocal intensity at the phrase level to achieve ~50-75% intelligibility. Patient and family education is ongoing. Patient would benefit from continued skilled SLP intervention to maximize his cognitive and swallowing function as well as his functional communication prior to discharge.     Intensity: Minumum of 1-2 x/day, 30 to 90 minutes Frequency: 3 to 5 out of 7 days Duration/Length of Stay: 06/04/16 Treatment/Interventions: Cognitive remediation/compensation;Cueing hierarchy;Functional tasks;Patient/family education;Therapeutic Activities;Environmental controls;Dysphagia/aspiration precaution training;Internal/external aids;Speech/Language facilitation      Kirsi Hugh 05/29/2016, 11:11 AM

## 2016-05-29 NOTE — Progress Notes (Signed)
Occupational Therapy Session Note  Patient Details  Name: [name] MRN: [id number] Date of Birth: Sep 12, 1960  Today's Date: 05/29/2016 OT Individual Time: 0800-0900 OT Individual Time Calculation (min): 60 min     Short Term Goals: Week 4:  OT Short Term Goal 1 (Week 4): Pt will perform LB dressing with mod A at bed level OT Short Term Goal 2 (Week 4): Pt will perform sliding board transfer to drop arm BSC with mod A OT Short Term Goal 3 (Week 4): Pt will perform toileting tasks with max A seated on [facility]  Skilled Therapeutic Interventions/Progress Updates:    Pt initially engaged in dressing tasks at bed level (LB) and EOB (UB). Pt performed sliding board transfer with min A and completed grooming tasks seated in w/c at sink.  Pt transitioned to therapy gym and engaged in peg boards activities replicating patterns with colored pegs.  Pt required min verbal cues to replicate complex pattern.  Pt was able to correct errors independently.  Pt continues to c/o L shoulder pain (PROM: flexion 0-160 without pain, horizontal abduction-increased pain at 90). Pt remained in w/c at nursing station.  Focus on continued ADL retraining, safety awareness, cognitive remediation including attention to task, visual organization, and problem solving.  Therapy Documentation Precautions:  Precautions Precautions: Fall, Cervical Precaution Comments: feeding tube, c collar at all times, left LE external fixator, right foot cast  Required Braces or Orthoses: Cervical Brace Cervical Brace: Hard collar, At all times Restrictions Weight Bearing Restrictions: Yes RLE Weight Bearing: Non weight bearing LLE Weight Bearing: Non weight bearing General:   Vital Signs: Therapy Vitals Pulse Rate: (!) 102 BP: (!) 130/92 Pain:   ADL: ADL ADL Comments: see functional navigator Exercises:   Other Treatments:    See Function Navigator for Current Functional Status.   Therapy/Group: Individual  Therapy  Leroy Libman 05/29/2016, 11:16 AM
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
## 2016-05-30 ENCOUNTER — Inpatient Hospital Stay (HOSPITAL_COMMUNITY): Payer: Self-pay | Admitting: Speech Pathology

## 2016-05-30 ENCOUNTER — Ambulatory Visit (HOSPITAL_COMMUNITY): Payer: Self-pay | Admitting: Physical Therapy

## 2016-05-30 ENCOUNTER — Inpatient Hospital Stay (HOSPITAL_COMMUNITY): Payer: Self-pay

## 2016-05-30 DIAGNOSIS — R7303 Prediabetes: Secondary | ICD-10-CM

## 2016-05-30 DIAGNOSIS — G8918 Other acute postprocedural pain: Secondary | ICD-10-CM

## 2016-05-30 DIAGNOSIS — S12101S Unspecified nondisplaced fracture of second cervical vertebra, sequela: Secondary | ICD-10-CM

## 2016-05-30 LAB — GLUCOSE, CAPILLARY: Glucose-Capillary: 95 mg/dL (ref 65–99)
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
## 2016-05-31 ENCOUNTER — Inpatient Hospital Stay (HOSPITAL_COMMUNITY): Payer: No Typology Code available for payment source

## 2016-05-31 ENCOUNTER — Inpatient Hospital Stay (HOSPITAL_COMMUNITY): Payer: No Typology Code available for payment source | Admitting: Speech Pathology

## 2016-05-31 ENCOUNTER — Inpatient Hospital Stay (HOSPITAL_COMMUNITY): Payer: Self-pay

## 2016-05-31 ENCOUNTER — Ambulatory Visit (HOSPITAL_COMMUNITY): Payer: Self-pay | Admitting: Physical Therapy

## 2016-05-31 NOTE — Progress Notes (Signed)
Speech Language Pathology Daily Session Note  Patient Details  Name: [name] MRN: [id number] Date of Birth: 1960/09/21  Today's Date: 05/31/2016 SLP Individual Time: [id number] SLP Individual Time Calculation (min): 55 min   Short Term Goals: Week 5: SLP Short Term Goal 1 (Week 5): Patient will recall new, daily information with Min A verbal and visual cues.  SLP Short Term Goal 2 (Week 5): Patient will demonstrate functional problem solving for basic and familiar tasks with supervision verbal cues.  SLP Short Term Goal 3 (Week 5): Patient will utilize an increased vocal intensity at the phrase level to achieve ~75% intelligibility with Mod A verbal cues.  SLP Short Term Goal 4 (Week 5): Patient will demonstrate efficient mastication of regular textures with complete oral clearance without overt s/s of aspiration with supervision verbal cues.   Skilled Therapeutic Interventions:Skilled treatment session focused on cognitive goals. SLP facilitated session by re-administering the MoCA (version 7.2). Patient scored 23/30 points with a score of 26 or above considered normal. Patient's score is an improvement from initial evaluation on 05/16/16 in which he scored 19/30 points. Patient continues to demonstrate deficits in the areas of recall, problem solving and attention. Patient followed 2 and 3 step directions with 100% accuracy and performed written expression tasks with supervision verbal cues to self-monitor and correct minor grammar and spelling errors. Patient also completed a reading comprehension task at the paragraph level with Mod I. Patient transferred back to bed at end of session and left with all needs within reach and bed alarm on. Continue with current plan of care.   Function:   Cognition Comprehension Comprehension assist level: Follows basic conversation/direction with extra time/assistive device  Expression   Expression assist level: Expresses basic 75 - 89% of the  time/requires cueing 10 - 24% of the time. Needs helper to occlude trach/needs to repeat words.  Social Interaction Social Interaction assist level: Interacts appropriately 75 - 89% of the time - Needs redirection for appropriate language or to initiate interaction.  Problem Solving Problem solving assist level: Solves basic 50 - 74% of the time/requires cueing 25 - 49% of the time  Memory Memory assist level: Recognizes or recalls 50 - 74% of the time/requires cueing 25 - 49% of the time    Pain Pain Assessment No/Denies Pain   Therapy/Group: Individual Therapy  [name] 05/31/2016, 3:03 PM

## 2016-05-31 NOTE — Progress Notes (Signed)
Speech Language Pathology Daily Session Note  Patient Details  Name: [name] MRN: [id number] Date of Birth: Dec 31, 1960  Today's Date: 05/31/2016 SLP Individual Time: [id number] SLP Individual Time Calculation (min): 30 min   Short Term Goals: Week 5: SLP Short Term Goal 1 (Week 5): Patient will recall new, daily information with Min A verbal and visual cues.  SLP Short Term Goal 2 (Week 5): Patient will demonstrate functional problem solving for basic and familiar tasks with supervision verbal cues.  SLP Short Term Goal 3 (Week 5): Patient will utilize an increased vocal intensity at the phrase level to achieve ~75% intelligibility with Mod A verbal cues.  SLP Short Term Goal 4 (Week 5): Patient will demonstrate efficient mastication of regular textures with complete oral clearance without overt s/s of aspiration with supervision verbal cues.   Skilled Therapeutic Interventions:Skilled treatment focused on cognitive and communicative goals. SLP provided Mod cues throughout session for increased vocal intensity. Pt completed self-care tasks to prepare for therapy that day with Min cues provided for basic problem solving and sustained attention. He needed Mod cues for recall of speech therapy activities, but only Min cues for recall of breakfast items from earlier today. Pt consumed thin liquids by cup with no overt s/s of aspiration. No cueing needed for safe intake. Continue plan of care.   Function:  Eating Eating                 Cognition Comprehension Comprehension assist level: Follows basic conversation/direction with extra time/assistive device  Expression   Expression assist level: Expresses basic 75 - 89% of the time/requires cueing 10 - 24% of the time. Needs helper to occlude trach/needs to repeat words.  Social Interaction Social Interaction assist level: Interacts appropriately 75 - 89% of the time - Needs redirection for appropriate language or to initiate  interaction.  Problem Solving Problem solving assist level: Solves basic 50 - 74% of the time/requires cueing 25 - 49% of the time  Memory Memory assist level: Recognizes or recalls 50 - 74% of the time/requires cueing 25 - 49% of the time    Pain Pain Assessment Pain Assessment: 0-10 Pain Score: 4  Faces Pain Scale: Hurts a little bit Pain Type: Acute pain Pain Location: Neck Pain Intervention(s): Medication (See eMAR)  Therapy/Group: Individual Therapy  Germain Osgood 05/31/2016, 12:35 PM  Germain Osgood, M.A. CCC-SLP [phone]

## 2016-05-31 NOTE — Progress Notes (Signed)
Occupational Therapy Note  Patient Details  Name: BENNEY YONEDA MRN: [id number] Date of Birth: Apr 30, 1961  Today's Date: 05/31/2016 OT Individual Time: 1300-1355 OT Individual Time Calculation (min): 55 min    Pt c/o ongoing L shoulder pain (5/10); heat applied and RN aware Individual Therapy  Pt resting in bed upon arrival.  Pt c/o ongoing pain in L shoulder but repots that heat "helps." Pt engaged in bed mobility, lateral leans sitting EOB, lateral scoots utilizing BUE to assist, sliding board transfers, and ongoing safety awareness education.  Pt continues to exhibit impulsive behaviors and requires mod verbal cues for safety during transitional movements.  Pt's new w/c delivered and appropriate adjustments made and modifications to bilateral leg rests.  Pt remained in w/c at nursing station awaiting next therapy with SLP.   Pt resting in bed upon arrival.  Focus on  Leroy Libman 05/31/2016, 1:56 PM

## 2016-05-31 NOTE — Progress Notes (Signed)
Occupational Therapy Session Note  Patient Details  Name: [name] MRN: [id number] Date of Birth: [birth year]  Today's Date: 05/31/2016 OT Individual Time: 1000-1100 OT Individual Time Calculation (min): 60 min     Short Term Goals: Week 5:  OT Short Term Goal 1 (Week 5): STG=LTG secondary to ELOS  Skilled Therapeutic Interventions/Progress Updates:    Pt resting in bed upon arrival with wife present for ongoing education.  Pt engaged in BADL retraining with wife providing appropriate assistance and supervision.  Continued ongoing education, transfer training, home safety, and general education regarding schedule and activity tolerance.  Pt and wife are pleased with progress and ready for discharge home 11/27 or 11/28 after LLE procedure to remove external fixator.  Therapy Documentation Precautions:  Precautions Precautions: Fall, Cervical Precaution Comments: feeding tube, c collar at all times, left LE external fixator, right foot cast  Required Braces or Orthoses: Cervical Brace Cervical Brace: Hard collar, At all times Restrictions Weight Bearing Restrictions: Yes RLE Weight Bearing: Non weight bearing LLE Weight Bearing: Non weight bearing   Pain: Pain Assessment Pain Assessment: 0-10 Pain Score: 4  Faces Pain Scale: Hurts a little bit Pain Type: Acute pain Pain Location: Neck Pain Intervention(s): Medication (See eMAR) ADL: ADL ADL Comments: see functional navigator  See Function Navigator for Current Functional Status.   Therapy/Group: Individual Therapy  Leroy Libman 05/31/2016, 12:12 PM
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
## 2016-06-01 ENCOUNTER — Inpatient Hospital Stay (HOSPITAL_COMMUNITY): Payer: Self-pay | Admitting: Occupational Therapy
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
## 2016-06-01 NOTE — Progress Notes (Signed)
Occupational Therapy Note  Patient Details  Name: [name] MRN: [id number] Date of Birth: 25-Apr-1961  Today's Date: 06/01/2016 OT Individual Time: 1300-1400 OT Individual Time Calculation (min): 60 min    1:1 Pt declined to participate in showering or toileting today for grad day despite numerous ways of encouragement. Performed dressing at bed level with min A overall. Pt able to perform slide board transfers (even and uneven) with close supervision with min to mod VC to maintain bilateral NWB.  Performed transfers bed<w/c<mat<w/c<bed in session all with close supervision with max cues for proper w/c setup and for board placement. Pt only c/o of anterior  pec mm soreness but able to work through it. Performed grooming at sink with setup without cues. Pt perfrom organization and sorting task of toolbox sitting on mat with it elevated to avoid LEs contact with floor. Pt required mod cuing to complete sorting task in a minimal distracting environment.  Also participated in w/c mobility task requiring mod cuing to avoid steering into objects on the left.    Return to bed at end of session with extended family members present.  Willeen Cass [facility] 06/01/2016, 2:28 PM

## 2016-06-02 ENCOUNTER — Inpatient Hospital Stay (HOSPITAL_COMMUNITY): Payer: No Typology Code available for payment source | Admitting: Physical Therapy

## 2016-06-02 ENCOUNTER — Inpatient Hospital Stay (HOSPITAL_COMMUNITY): Payer: Self-pay | Admitting: Occupational Therapy

## 2016-06-02 MED ORDER — PANTOPRAZOLE SODIUM 40 MG PO TBEC
40.0000 mg | DELAYED_RELEASE_TABLET | Freq: Two times a day (BID) | ORAL | Status: DC
  Administered 2016-06-02 (×2): 40 mg via ORAL
  Filled 2016-06-02 (×2): qty 1

## 2016-06-02 MED ORDER — METOPROLOL TARTRATE 50 MG PO TABS
50.0000 mg | ORAL_TABLET | Freq: Two times a day (BID) | ORAL | Status: DC
  Administered 2016-06-02 (×2): 50 mg via ORAL
  Filled 2016-06-02 (×2): qty 1
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
## 2016-06-02 NOTE — Progress Notes (Signed)
Physical Therapy Session Note  Patient Details  Name: [name] MRN: [id number] Date of Birth: 1960/12/10  Today's Date: 06/02/2016 PT Individual Time: 0804-0900 and [id number] PT Individual Time Calculation (min): 56 min and 31 min   Short Term Goals: Week 4:  PT Short Term Goal 1 (Week 4): Pt will demonstrate emergent awareness with min cuing. PT Short Term Goal 2 (Week 4): Pt will consistently perform slide board transfers with min assist +1. PT Short Term Goal 3 (Week 4): Pt will consistently perform bed mobility (regular bed) with supervision.  PT Short Term Goal 4 (Week 4): Pt will safely negotiate ramp in w/c with min assist.  Skilled Therapeutic Interventions/Progress Updates:    Treatment 1: Pt received in bed & agreeable to tx, noting 5/10 pain in B shoulders & pain medication requested. Pt's wife, [name], present for majority of session. Session focused on transfers (hospital bed<>w/c, w/c<>apartment bed) with slide board & min assist with pt's wife demonstrating ability to set up w/c and slide board and assist pt with transfer with min cuing from therapist for w/c parts management and NWB BLE. Pt able to perform bed mobility in apartment bed with close supervision and max cuing for rolling as pt attempted to push through LE's to assist with movement; educated pt to cross LE over one another to assist with rolling. Pt propelled w/c room<>ortho gym with supervision and mod cuing to prevent pt from steering too close to objects on L side. Pt requires extra time to negotiate doorways & small spaces in rehab apartment. Pt ascended ramp with close supervision and descended ramp with min assist as pt demonstrates poor control of w/c and does not attempt to slow down. Pt's wife reports she will bring in alternate vehicle to practice car transfer during afternoon PT session. At end of session pt left sitting in w/c in room with QRB in place & wife present to supervise.   Treatment 2: Pt  received in bed & agreeable to tx, noting 5/10 B shoulder pain. Pt transferred bed>w/c with slide board & min assist with cuing for NWB BLE. Transported pt outside where pt completed real car transfer with wife. Provided education regarding w/c placement, positioning of caregiver and hand placement while assisting pt with transfer. Pt requires max cuing from wife for body position on slide board, sequencing & safety; wife is able to instruct pt during task without cuing from therapist. Pt with increased ease transferring out of car as that is from high>low surface. After transfer pt's wife voiced no concerns regarding d/c home. Transported pt back to room & left in handoff to OT.  Pt unable to keep BLE off of ground during transfers and requires max cuing to maintain NWB BLE, but pt with difficulty doing so.  Therapy Documentation Precautions:  Precautions Precautions: Fall, Cervical Precaution Comments: feeding tube, c collar at all times, left LE external fixator, right foot cast  Required Braces or Orthoses: Cervical Brace Cervical Brace: Hard collar, At all times Restrictions Weight Bearing Restrictions: Yes RLE Weight Bearing: Non weight bearing LLE Weight Bearing: Non weight bearing    See Function Navigator for Current Functional Status.   Therapy/Group: Individual Therapy  [name] 06/02/2016, 9:31 AM

## 2016-06-02 NOTE — Plan of Care (Signed)
Problem: RH Bed to Chair Transfers Goal: LTG Patient will perform bed/chair transfers w/assist (PT) LTG: Patient will perform bed/chair transfers with assistance, with/without cues (PT).  Outcome: Completed/Met Date Met: 06/02/16 With slide board  Problem: RH Car Transfers Goal: LTG Patient will perform car transfers with assist (PT) LTG: Patient will perform car transfers with assistance (PT).  Outcome: Completed/Met Date Met: 06/02/16 With slide board  Problem: RH Wheelchair Mobility Goal: LTG Patient will propel w/c in controlled environment (PT) LTG: Patient will propel wheelchair in controlled environment, # of feet with assist (PT)  Outcome: Completed/Met Date Met: 06/02/16 150 ft  Goal: LTG Patient will propel w/c in home environment (PT) LTG: Patient will propel wheelchair in home environment, # of feet with assistance (PT).  Outcome: Completed/Met Date Met: 06/02/16 50 ft

## 2016-06-02 NOTE — Progress Notes (Signed)
Physical Therapy Discharge Summary  Patient Details  Name: [name] MRN: [id number] Date of Birth: 06/02/1961  Today's Date: 06/03/2016 PT Individual Time: 0762-2633 PT Individual Time Calculation (min): 25 min   Patient has met 8 of 8 long term goals due to improved activity tolerance, improved balance, improved postural control, increased strength, ability to compensate for deficits, functional use of  right upper extremity, right lower extremity, left upper extremity and left lower extremity, improved attention, improved awareness and improved coordination.  Patient to discharge at a wheelchair level [name].   Patient's care partner is independent to provide the necessary physical and cognitive assistance at discharge.  Reasons goals not met: N/A  Recommendation:  Patient will benefit from ongoing skilled PT services in home health setting to continue to advance safe functional mobility, address ongoing impairments in decreased safety awareness, impaired memory, impaired cognition, impaired problem solving, transfers, bed & w/c mobility, progress towards standing when cleared by MD, decreased dynamic sitting balance, mild L inattention and minimize fall risk.  Equipment: 20x18 w/c with elevating leg rests, slide board  Reasons for discharge: treatment goals met  Patient/family agrees with progress made and goals achieved: Yes  PT Discharge Pt received in bed; discussed re application of Kinesiotape for pain management, mm facilitation and minimize mm hypertonicity.  Pt performed supine > sit EOB with supervision and sat EOB without back support but with UE support with supervision to doff and don shirt and for re-application of kinesiotape to shoulder and anterior chest.  Pt returned to supine in bed with supervision.    Precautions/Restrictions Precautions Precautions: Cervical;Fall Required Braces or Orthoses: Cervical Brace Cervical Brace: Hard collar;At all  times Restrictions RLE Weight Bearing: Non weight bearing LLE Weight Bearing: Non weight bearing Other Position/Activity Restrictions:  (ex fix LLE, cast RLE)  Vision/Perception    mild L inattention  Cognition Overall Cognitive Status: Impaired/Different from baseline Arousal/Alertness: Awake/alert Orientation Level: Oriented to person;Oriented to time;Disoriented to place;Oriented to situation (oriented to year only) Attention: Sustained;Selective Sustained Attention: Appears intact Sustained Attention Impairment: Functional basic Selective Attention Impairment: Functional basic (in mildly controlled environment for short periods of time) Memory Impairment: Decreased recall of new information Awareness: Impaired Awareness Impairment: Emergent impairment [employer] Scales of Cognitive Functioning: Automatic/appropriate  Sensation Sensation Light Touch: Appears Intact (BLE) Proprioception: Appears Intact (BLE) Coordination Gross Motor Movements are Fluid and Coordinated: Yes  Motor  Motor Motor - Skilled Clinical Observations: improved overall, pt with continued reduced use of LUE 2/2 pain in shoulder   Mobility Bed Mobility Bed Mobility: Rolling Right;Rolling Left;Supine to Sit;Sit to Supine Rolling Right: 5: Supervision Rolling Right Details: Verbal cues for technique;Verbal cues for precautions/safety Rolling Left: 5: Supervision Rolling Left Details: Verbal cues for precautions/safety;Verbal cues for technique Supine to Sit: 5: Supervision Supine to Sit Details: Verbal cues for precautions/safety Sit to Supine: 5: Supervision Sit to Supine - Details: Verbal cues for precautions/safety Transfers Transfers: Yes Lateral/Scoot Transfers: 4: Min assist;With slide board Lateral/Scoot Transfer Details: Verbal cues for technique;Verbal cues for sequencing;Verbal cues for precautions/safety;Tactile cues for weight shifting  Locomotion  Ambulation Ambulation:  No Gait Gait: No Stairs / Additional Locomotion Stairs: No Ramp: 4: Min assist (in w/c) Architect: Yes Wheelchair Assistance: 5: Supervision Wheelchair Assistance Details: Verbal cues for precautions/safety;Verbal cues for technique (verbal cuing to attend to L) Environmental health practitioner: Both upper extremities Wheelchair Parts Management: Needs assistance Distance: 150 ft    Trunk/Postural Assessment  Cervical Assessment Cervical Assessment:  (  c-collar)   Balance Balance Balance Assessed: Yes Static Sitting Balance Static Sitting - Balance Support: No upper extremity supported Static Sitting - Level of Assistance: 5: Stand by assistance  Extremity Assessment  RLE Assessment RLE Assessment: Within Functional Limits (limited by cast) LLE Assessment LLE Assessment: Within Functional Limits (limited by ex-fix)   See Function Navigator for Current Functional Status.  [name] 06/02/2016, 5:20 PM

## 2016-06-02 NOTE — Progress Notes (Signed)
Occupational Therapy Session Note  Patient Details  Name: [name] MRN: [id number] Date of Birth: 1960/10/28  Today's Date: 06/02/2016 OT Individual Time: 0017-4944 and 1335-1415 OT Individual Time Calculation (min): 72 min and 40 minutes  Short Term Goals: Week 1:  OT Short Term Goal 1 (Week 1): Pt will maintain sustained attention during functional tasks for 60 seconds with mod verbal cues OT Short Term Goal 1 - Progress (Week 1): Met OT Short Term Goal 2 (Week 1): Pt will follow one step commands for functional tasks with mod demonstration/verbal cues  OT Short Term Goal 2 - Progress (Week 1): Met OT Short Term Goal 3 (Week 1): Pt will initiate/perform anterior lean in w/c to facilitate UB dressing tasks with mod A OT Short Term Goal 3 - Progress (Week 1): Progressing toward goal OT Short Term Goal 4 (Week 1): Pt will thread BUE into shirt sleeves with max A OT Short Term Goal 4 - Progress (Week 1): Met Week 2:  OT Short Term Goal 1 (Week 2): Pt will initiate/perform anterior lean in w/c to facilitate UB dressing tasks with mod A OT Short Term Goal 1 - Progress (Week 2): Met OT Short Term Goal 2 (Week 2): Pt will follow one step commands during functional tasks with min verbal cues OT Short Term Goal 2 - Progress (Week 2): Met OT Short Term Goal 3 (Week 2): Pt will initiate threading BLE into pants with max A while seated EOB OT Short Term Goal 3 - Progress (Week 2): Progressing toward goal OT Short Term Goal 4 (Week 2): Pt will maintain sitting balance EOB with close supervison while completing UB bathing/dressing tasks OT Short Term Goal 4 - Progress (Week 2): Met Week 3:  OT Short Term Goal 1 (Week 3): Pt will initiate threading BLE into pants with max A while seated EOB OT Short Term Goal 1 - Progress (Week 3): Met OT Short Term Goal 2 (Week 3): Pt will attend to functional task for 60 seconds with min verbal cues OT Short Term Goal 2 - Progress (Week 3): Met OT Short Term  Goal 3 (Week 3): Pt will maintain unsupported sitting balance with supervision for 5 mins to complete functional task OT Short Term Goal 3 - Progress (Week 3): Met OT Short Term Goal 4 (Week 3): Pt will perform lateral lean with max A in preparation for pulling up pants and/or performing toilet hygiene OT Short Term Goal 4 - Progress (Week 3): Met  Skilled Therapeutic Interventions/Progress Updates:   Pt was lying in bed at time of arrival with friend present. Pt was agreeable to shower. Slideboard transfer from bed<w/c<tub bench completed with Min-Mod A with max vcs for NWB on bilateral LEs with pt still using during transfers. LEs covered. Bathing completed with use of LH sponge while seated with overall Min A. Afterwards pt transferred back to w/c and then to bed for dressing completion. Cervical collar pads changed with Max A and mod vcs for keeping head immobile. LB dressing completed with overall Min A with instruction on adaptive technique bedlevel. Shirt donned at EOB with setup. Friend was present during session and required education regarding allowing pt to complete ADL at max level of independence with verbalized understanding. At end of session pt was repositioned in bed, and left with friend and all needs within reach. Bed alarm activated.   2nd Session 1:1 Tx (40 minutes) Pt was received via PT handoff. Pt reported having pain but was not  due for pain medication per nursing report. Pt was agreeable to tx in gym.  Slideboard transfer completed w/c<mat with steady assist and instruction on sequencing and precautions. Pt completed car racing activity on wii due to pt enjoyment of racing. Tx focus on dynamic sitting balance, new learning, and UE strengthening/coordination. Pt completed task with mat elevated for NWB on LEs. After activity, pt completed slideboard transfer back to w/c in manner as written above. Pt self propelled back to room (with supervision due to pt tendency to veer left) and  was transferred to bed. Max cues provided for using UEs for repositioning himself in bed instead of LEs. At end of session pt was left with all needs within reach and bed alarm activated.   Therapy Documentation Precautions:  Precautions Precautions: Fall, Cervical Precaution Comments: feeding tube, c collar at all times, left LE external fixator, right foot cast  Required Braces or Orthoses: Cervical Brace Cervical Brace: Hard collar, At all times Restrictions Weight Bearing Restrictions: Yes RLE Weight Bearing: Non weight bearing LLE Weight Bearing: Non weight bearing General:   Vital Signs: Therapy Vitals Pulse Rate: 91 BP: 129/89 Pain: No c/o pain during sessions Pain Assessment Pain Assessment: 0-10 Pain Score: 4  Pain Type: Acute pain Pain Location: Head Pain Descriptors / Indicators: Aching Pain Onset: On-going Pain Intervention(s): Medication (See eMAR) ADL: ADL ADL Comments: see functional navigator:    See Function Navigator for Current Functional Status.   Therapy/Group: Individual Therapy  Lakindra Wible A Deforrest Bogle 06/02/2016, 12:32 PM

## 2016-06-03 ENCOUNTER — Inpatient Hospital Stay (HOSPITAL_COMMUNITY): Payer: No Typology Code available for payment source | Admitting: Certified Registered Nurse Anesthetist

## 2016-06-03 ENCOUNTER — Inpatient Hospital Stay (HOSPITAL_COMMUNITY): Payer: Self-pay | Admitting: Physical Therapy

## 2016-06-03 ENCOUNTER — Encounter (HOSPITAL_COMMUNITY)
Admission: RE | Disposition: A | Discharge status: Home Health | Payer: Self-pay | Source: Intra-hospital | Attending: Physical Medicine & Rehabilitation

## 2016-06-03 ENCOUNTER — Inpatient Hospital Stay (HOSPITAL_COMMUNITY): Admission: RE | Admit: 2016-06-03 | Payer: Self-pay | Source: Ambulatory Visit | Admitting: Orthopedic Surgery

## 2016-06-03 ENCOUNTER — Other Ambulatory Visit: Payer: Self-pay

## 2016-06-03 ENCOUNTER — Inpatient Hospital Stay (HOSPITAL_COMMUNITY): Payer: No Typology Code available for payment source

## 2016-06-03 ENCOUNTER — Encounter (HOSPITAL_COMMUNITY): Payer: Self-pay | Admitting: Certified Registered Nurse Anesthetist

## 2016-06-03 ENCOUNTER — Ambulatory Visit (HOSPITAL_COMMUNITY)
Admission: RE | Admit: 2016-06-03 | Discharge: 2016-06-04 | Disposition: A | Discharge status: Home Health | Payer: No Typology Code available for payment source | Source: Ambulatory Visit | Attending: Orthopedic Surgery | Admitting: Orthopedic Surgery

## 2016-06-03 DIAGNOSIS — E291 Testicular hypofunction: Secondary | ICD-10-CM | POA: Diagnosis present

## 2016-06-03 DIAGNOSIS — S12100A Unspecified displaced fracture of second cervical vertebra, initial encounter for closed fracture: Secondary | ICD-10-CM | POA: Diagnosis present

## 2016-06-03 DIAGNOSIS — E785 Hyperlipidemia, unspecified: Secondary | ICD-10-CM | POA: Insufficient documentation

## 2016-06-03 DIAGNOSIS — Z87820 Personal history of traumatic brain injury: Secondary | ICD-10-CM | POA: Insufficient documentation

## 2016-06-03 DIAGNOSIS — S069X3A Unspecified intracranial injury with loss of consciousness of 1 hour to 5 hours 59 minutes, initial encounter: Secondary | ICD-10-CM | POA: Diagnosis present

## 2016-06-03 DIAGNOSIS — S92902B Unspecified fracture of left foot, initial encounter for open fracture: Secondary | ICD-10-CM | POA: Diagnosis present

## 2016-06-03 DIAGNOSIS — S92009A Unspecified fracture of unspecified calcaneus, initial encounter for closed fracture: Secondary | ICD-10-CM | POA: Diagnosis present

## 2016-06-03 DIAGNOSIS — S92001D Unspecified fracture of right calcaneus, subsequent encounter for fracture with routine healing: Secondary | ICD-10-CM | POA: Insufficient documentation

## 2016-06-03 DIAGNOSIS — S2243XA Multiple fractures of ribs, bilateral, initial encounter for closed fracture: Secondary | ICD-10-CM | POA: Diagnosis present

## 2016-06-03 DIAGNOSIS — I10 Essential (primary) hypertension: Secondary | ICD-10-CM | POA: Diagnosis present

## 2016-06-03 DIAGNOSIS — S92101D Unspecified fracture of right talus, subsequent encounter for fracture with routine healing: Secondary | ICD-10-CM | POA: Insufficient documentation

## 2016-06-03 DIAGNOSIS — E559 Vitamin D deficiency, unspecified: Secondary | ICD-10-CM | POA: Diagnosis present

## 2016-06-03 DIAGNOSIS — T8484XD Pain due to internal orthopedic prosthetic devices, implants and grafts, subsequent encounter: Secondary | ICD-10-CM | POA: Diagnosis not present

## 2016-06-03 DIAGNOSIS — K219 Gastro-esophageal reflux disease without esophagitis: Secondary | ICD-10-CM | POA: Diagnosis present

## 2016-06-03 DIAGNOSIS — R000 Tachycardia, unspecified: Secondary | ICD-10-CM | POA: Insufficient documentation

## 2016-06-03 DIAGNOSIS — S064X9A Epidural hemorrhage with loss of consciousness of unspecified duration, initial encounter: Secondary | ICD-10-CM | POA: Diagnosis present

## 2016-06-03 DIAGNOSIS — F17210 Nicotine dependence, cigarettes, uncomplicated: Secondary | ICD-10-CM | POA: Insufficient documentation

## 2016-06-03 DIAGNOSIS — E1169 Type 2 diabetes mellitus with other specified complication: Secondary | ICD-10-CM | POA: Diagnosis present

## 2016-06-03 DIAGNOSIS — S143XXA Injury of brachial plexus, initial encounter: Secondary | ICD-10-CM | POA: Diagnosis present

## 2016-06-03 DIAGNOSIS — R7309 Other abnormal glucose: Secondary | ICD-10-CM | POA: Diagnosis present

## 2016-06-03 DIAGNOSIS — Z7982 Long term (current) use of aspirin: Secondary | ICD-10-CM | POA: Insufficient documentation

## 2016-06-03 DIAGNOSIS — T8484XA Pain due to internal orthopedic prosthetic devices, implants and grafts, initial encounter: Secondary | ICD-10-CM | POA: Diagnosis present

## 2016-06-03 DIAGNOSIS — S12400A Unspecified displaced fracture of fifth cervical vertebra, initial encounter for closed fracture: Secondary | ICD-10-CM | POA: Diagnosis present

## 2016-06-03 DIAGNOSIS — S92102D Unspecified fracture of left talus, subsequent encounter for fracture with routine healing: Secondary | ICD-10-CM | POA: Insufficient documentation

## 2016-06-03 DIAGNOSIS — Z79899 Other long term (current) drug therapy: Secondary | ICD-10-CM | POA: Insufficient documentation

## 2016-06-03 DIAGNOSIS — S27322A Contusion of lung, bilateral, initial encounter: Secondary | ICD-10-CM | POA: Diagnosis present

## 2016-06-03 DIAGNOSIS — S82401A Unspecified fracture of shaft of right fibula, initial encounter for closed fracture: Secondary | ICD-10-CM | POA: Diagnosis present

## 2016-06-03 DIAGNOSIS — S15102A Unspecified injury of left vertebral artery, initial encounter: Secondary | ICD-10-CM | POA: Diagnosis present

## 2016-06-03 DIAGNOSIS — S92109A Unspecified fracture of unspecified talus, initial encounter for closed fracture: Secondary | ICD-10-CM | POA: Diagnosis present

## 2016-06-03 DIAGNOSIS — S92901A Unspecified fracture of right foot, initial encounter for closed fracture: Secondary | ICD-10-CM | POA: Diagnosis present

## 2016-06-03 DIAGNOSIS — E119 Type 2 diabetes mellitus without complications: Secondary | ICD-10-CM | POA: Insufficient documentation

## 2016-06-03 DIAGNOSIS — R1310 Dysphagia, unspecified: Secondary | ICD-10-CM | POA: Insufficient documentation

## 2016-06-03 DIAGNOSIS — E782 Mixed hyperlipidemia: Secondary | ICD-10-CM | POA: Diagnosis present

## 2016-06-03 DIAGNOSIS — S92002D Unspecified fracture of left calcaneus, subsequent encounter for fracture with routine healing: Secondary | ICD-10-CM | POA: Insufficient documentation

## 2016-06-03 DIAGNOSIS — S12200A Unspecified displaced fracture of third cervical vertebra, initial encounter for closed fracture: Secondary | ICD-10-CM | POA: Diagnosis present

## 2016-06-03 DIAGNOSIS — S92252D Displaced fracture of navicular [scaphoid] of left foot, subsequent encounter for fracture with routine healing: Secondary | ICD-10-CM | POA: Insufficient documentation

## 2016-06-03 HISTORY — PX: EXTERNAL FIXATION REMOVAL: SHX5040

## 2016-06-03 LAB — CBC WITH DIFFERENTIAL/PLATELET
Basophils Absolute: 0 10*3/uL (ref 0.0–0.1)
Basophils Relative: 0 %
Eosinophils Absolute: 0.1 10*3/uL (ref 0.0–0.7)
Eosinophils Relative: 1 %
HCT: 38.7 % — ABNORMAL LOW (ref 39.0–52.0)
Hemoglobin: 12.4 g/dL — ABNORMAL LOW (ref 13.0–17.0)
Lymphocytes Relative: 22 %
Lymphs Abs: 2.6 10*3/uL (ref 0.7–4.0)
MCH: 27.4 pg (ref 26.0–34.0)
MCHC: 32 g/dL (ref 30.0–36.0)
MCV: 85.4 fL (ref 78.0–100.0)
Monocytes Absolute: 1.1 10*3/uL — ABNORMAL HIGH (ref 0.1–1.0)
Monocytes Relative: 9 %
Neutro Abs: 8.4 10*3/uL — ABNORMAL HIGH (ref 1.7–7.7)
Neutrophils Relative %: 68 %
Platelets: 287 10*3/uL (ref 150–400)
RBC: 4.53 MIL/uL (ref 4.22–5.81)
RDW: 14.9 % (ref 11.5–15.5)
WBC: 12.1 10*3/uL — ABNORMAL HIGH (ref 4.0–10.5)

## 2016-06-03 LAB — BASIC METABOLIC PANEL
Anion gap: 10 (ref 5–15)
BUN: 16 mg/dL (ref 6–20)
CO2: 24 mmol/L (ref 22–32)
Calcium: 9.8 mg/dL (ref 8.9–10.3)
Chloride: 100 mmol/L — ABNORMAL LOW (ref 101–111)
Creatinine, Ser: 0.49 mg/dL — ABNORMAL LOW (ref 0.61–1.24)
GFR calc Af Amer: >60 mL/min (ref >60)
GFR calc non Af Amer: >60 mL/min (ref >60)
Glucose, Bld: 98 mg/dL (ref 65–99)
Potassium: 4.1 mmol/L (ref 3.5–5.1)
Sodium: 134 mmol/L — ABNORMAL LOW (ref 135–145)

## 2016-06-03 LAB — GLUCOSE, CAPILLARY: Glucose-Capillary: 102 mg/dL — ABNORMAL HIGH (ref 65–99)

## 2016-06-03 SURGERY — REMOVAL, EXTERNAL FIXATION DEVICE, LOWER EXTREMITY
Anesthesia: General | Site: Leg Lower | Laterality: Bilateral

## 2016-06-03 MED ORDER — SODIUM CHLORIDE 0.9 % IV SOLN
INTRAVENOUS | Status: DC | PRN
  Administered 2016-06-03: 14:00:00 via INTRAVENOUS

## 2016-06-03 MED ORDER — METHYLPHENIDATE HCL 5 MG PO TABS: 5.0000 mg | ORAL_TABLET | Freq: Two times a day (BID) | ORAL | 0 refills | Status: DC

## 2016-06-03 MED ORDER — ESOMEPRAZOLE MAGNESIUM 40 MG PO CPDR: 40.0000 mg | DELAYED_RELEASE_CAPSULE | Freq: Every day | ORAL | 4 refills | Status: DC

## 2016-06-03 MED ORDER — ONDANSETRON HCL 4 MG/2ML IJ SOLN
INTRAMUSCULAR | Status: DC | PRN
  Administered 2016-06-03: 4 mg via INTRAVENOUS

## 2016-06-03 MED ORDER — METOPROLOL TARTRATE 50 MG PO TABS
50.0000 mg | ORAL_TABLET | Freq: Two times a day (BID) | ORAL | Status: DC
  Administered 2016-06-03: 50 mg via ORAL
  Filled 2016-06-03: qty 1

## 2016-06-03 MED ORDER — ENALAPRIL MALEATE 5 MG PO TABS
5.0000 mg | ORAL_TABLET | Freq: Every day | ORAL | Status: DC
  Administered 2016-06-03: 5 mg via ORAL
  Filled 2016-06-03: qty 1

## 2016-06-03 MED ORDER — KETOROLAC TROMETHAMINE 15 MG/ML IJ SOLN
INTRAMUSCULAR | Status: DC
Start: 2016-06-03 — End: 2016-06-03
  Filled 2016-06-03: qty 1

## 2016-06-03 MED ORDER — SUCCINYLCHOLINE 20MG/ML (10ML) SYRINGE FOR MEDFUSION PUMP - OPTIME
INTRAMUSCULAR | Status: DC | PRN
  Administered 2016-06-03: 130 mg via INTRAVENOUS

## 2016-06-03 MED ORDER — MORPHINE SULFATE (PF) 4 MG/ML IV SOLN
2.0000 mg | INTRAVENOUS | Status: AC | PRN
  Administered 2016-06-03 (×6): 2 mg via INTRAVENOUS

## 2016-06-03 MED ORDER — ONDANSETRON HCL 4 MG/2ML IJ SOLN: 4.0000 mg | Freq: Once | INTRAMUSCULAR | Status: DC | PRN

## 2016-06-03 MED ORDER — MIDAZOLAM HCL 5 MG/5ML IJ SOLN
INTRAMUSCULAR | Status: DC | PRN
  Administered 2016-06-03: 2 mg via INTRAVENOUS

## 2016-06-03 MED ORDER — POLYETHYLENE GLYCOL 3350 17 G PO PACK: 17.0000 g | PACK | Freq: Every day | ORAL | 0 refills | Status: DC

## 2016-06-03 MED ORDER — PROPOFOL 10 MG/ML IV BOLUS
INTRAVENOUS | Status: DC | PRN
  Administered 2016-06-03: 150 mg via INTRAVENOUS

## 2016-06-03 MED ORDER — METOPROLOL TARTRATE 50 MG PO TABS: 50.0000 mg | ORAL_TABLET | Freq: Two times a day (BID) | ORAL | 1 refills | Status: DC

## 2016-06-03 MED ORDER — MEPERIDINE HCL 25 MG/ML IJ SOLN: 6.2500 mg | INTRAMUSCULAR | Status: DC | PRN

## 2016-06-03 MED ORDER — LABETALOL HCL 5 MG/ML IV SOLN
INTRAVENOUS | Status: AC
  Administered 2016-06-03: 5 mg via INTRAVENOUS
  Filled 2016-06-03: qty 4

## 2016-06-03 MED ORDER — MORPHINE SULFATE (PF) 4 MG/ML IV SOLN
INTRAVENOUS | Status: AC
  Filled 2016-06-03: qty 1

## 2016-06-03 MED ORDER — KETOROLAC TROMETHAMINE 15 MG/ML IJ SOLN
15.0000 mg | Freq: Four times a day (QID) | INTRAMUSCULAR | Status: DC
  Administered 2016-06-03: 15 mg via INTRAVENOUS

## 2016-06-03 MED ORDER — ATORVASTATIN CALCIUM 80 MG PO TABS: ORAL_TABLET | ORAL | 4 refills | Status: DC

## 2016-06-03 MED ORDER — CHLORHEXIDINE GLUCONATE 4 % EX LIQD
60.0000 mL | Freq: Once | CUTANEOUS | Status: AC
  Administered 2016-06-03: 4 via TOPICAL
  Filled 2016-06-03: qty 60

## 2016-06-03 MED ORDER — HYDROMORPHONE HCL 2 MG/ML IJ SOLN
INTRAMUSCULAR | Status: AC
  Filled 2016-06-03: qty 1

## 2016-06-03 MED ORDER — HYDROCODONE-ACETAMINOPHEN 5-325 MG PO TABS: 1.0000 | ORAL_TABLET | ORAL | 0 refills | Status: DC | PRN

## 2016-06-03 MED ORDER — MIDAZOLAM HCL 2 MG/2ML IJ SOLN
INTRAMUSCULAR | Status: AC
  Filled 2016-06-03: qty 2

## 2016-06-03 MED ORDER — HYDROMORPHONE HCL 1 MG/ML IJ SOLN: 0.2500 mg | INTRAMUSCULAR | Status: DC | PRN

## 2016-06-03 MED ORDER — FENTANYL CITRATE (PF) 100 MCG/2ML IJ SOLN
INTRAMUSCULAR | Status: DC | PRN
  Administered 2016-06-03: 50 ug via INTRAVENOUS
  Administered 2016-06-03: 150 ug via INTRAVENOUS
  Administered 2016-06-03 (×2): 50 ug via INTRAVENOUS

## 2016-06-03 MED ORDER — VITAMIN D 1000 UNITS PO TABS: 1000.0000 [IU] | ORAL_TABLET | Freq: Every day | ORAL | 1 refills | Status: AC

## 2016-06-03 MED ORDER — CEFAZOLIN SODIUM-DEXTROSE 2-4 GM/100ML-% IV SOLN
2.0000 g | INTRAVENOUS | Status: AC
  Administered 2016-06-03: 2 g via INTRAVENOUS
  Filled 2016-06-03: qty 100

## 2016-06-03 MED ORDER — DICLOFENAC SODIUM 1 % TD GEL: 2.0000 g | Freq: Four times a day (QID) | TRANSDERMAL | 0 refills | Status: DC

## 2016-06-03 MED ORDER — FENTANYL CITRATE (PF) 100 MCG/2ML IJ SOLN
INTRAMUSCULAR | Status: AC
  Filled 2016-06-03: qty 2

## 2016-06-03 MED ORDER — HYDROMORPHONE HCL 1 MG/ML IJ SOLN
0.2500 mg | INTRAMUSCULAR | Status: DC | PRN
  Administered 2016-06-03 (×4): 0.5 mg via INTRAVENOUS

## 2016-06-03 MED ORDER — ENALAPRIL MALEATE 5 MG PO TABS: 5.0000 mg | ORAL_TABLET | Freq: Every day | ORAL | 1 refills | Status: DC

## 2016-06-03 MED ORDER — LABETALOL HCL 5 MG/ML IV SOLN
5.0000 mg | INTRAVENOUS | Status: DC | PRN
  Administered 2016-06-03 (×2): 5 mg via INTRAVENOUS

## 2016-06-03 MED ORDER — HYDROMORPHONE HCL 1 MG/ML IJ SOLN: 1.0000 mg | INTRAMUSCULAR | Status: DC | PRN

## 2016-06-03 SURGICAL SUPPLY — 65 items
BANDAGE ACE 4X5 VEL STRL LF (GAUZE/BANDAGES/DRESSINGS) IMPLANT
BANDAGE ACE 6X5 VEL STRL LF (GAUZE/BANDAGES/DRESSINGS) ×3 IMPLANT
BANDAGE ESMARK 6X9 LF (GAUZE/BANDAGES/DRESSINGS) IMPLANT
BNDG COHESIVE 6X5 TAN STRL LF (GAUZE/BANDAGES/DRESSINGS) IMPLANT
BNDG ESMARK 6X9 LF (GAUZE/BANDAGES/DRESSINGS)
BNDG GAUZE ELAST 4 BULKY (GAUZE/BANDAGES/DRESSINGS) ×3 IMPLANT
BRUSH SCRUB DISP (MISCELLANEOUS) ×6 IMPLANT
CLEANER TIP ELECTROSURG 2X2 (MISCELLANEOUS) IMPLANT
CLOSURE WOUND 1/2 X4 (GAUZE/BANDAGES/DRESSINGS)
COVER SURGICAL LIGHT HANDLE (MISCELLANEOUS) ×6 IMPLANT
CUFF TOURNIQUET SINGLE 18IN (TOURNIQUET CUFF) IMPLANT
CUFF TOURNIQUET SINGLE 24IN (TOURNIQUET CUFF) IMPLANT
CUFF TOURNIQUET SINGLE 34IN LL (TOURNIQUET CUFF) IMPLANT
DRAPE C-ARM 42X72 X-RAY (DRAPES) ×3 IMPLANT
DRAPE C-ARMOR (DRAPES) ×3 IMPLANT
DRAPE OEC MINIVIEW 54X84 (DRAPES) IMPLANT
DRAPE U-SHAPE 47X51 STRL (DRAPES) IMPLANT
DRSG ADAPTIC 3X8 NADH LF (GAUZE/BANDAGES/DRESSINGS) ×3 IMPLANT
ELECT REM PT RETURN 9FT ADLT (ELECTROSURGICAL) ×3
ELECTRODE REM PT RTRN 9FT ADLT (ELECTROSURGICAL) ×1 IMPLANT
EVACUATOR 1/8 PVC DRAIN (DRAIN) IMPLANT
GAUZE SPONGE 4X4 12PLY STRL (GAUZE/BANDAGES/DRESSINGS) ×3 IMPLANT
GLOVE BIO SURGEON STRL SZ7.5 (GLOVE) ×3 IMPLANT
GLOVE BIO SURGEON STRL SZ8 (GLOVE) ×3 IMPLANT
GLOVE BIOGEL PI IND STRL 7.5 (GLOVE) ×1 IMPLANT
GLOVE BIOGEL PI IND STRL 8 (GLOVE) ×1 IMPLANT
GLOVE BIOGEL PI INDICATOR 7.5 (GLOVE) ×2
GLOVE BIOGEL PI INDICATOR 8 (GLOVE) ×2
GOWN STRL REUS W/ TWL LRG LVL3 (GOWN DISPOSABLE) IMPLANT
GOWN STRL REUS W/ TWL XL LVL3 (GOWN DISPOSABLE) ×1 IMPLANT
GOWN STRL REUS W/TWL LRG LVL3 (GOWN DISPOSABLE)
GOWN STRL REUS W/TWL XL LVL3 (GOWN DISPOSABLE) ×2
KIT BASIN OR (CUSTOM PROCEDURE TRAY) ×3 IMPLANT
KIT ROOM TURNOVER OR (KITS) ×3 IMPLANT
MANIFOLD NEPTUNE II (INSTRUMENTS) IMPLANT
NEEDLE 22X1 1/2 (OR ONLY) (NEEDLE) IMPLANT
NS IRRIG 1000ML POUR BTL (IV SOLUTION) ×3 IMPLANT
PACK ORTHO EXTREMITY (CUSTOM PROCEDURE TRAY) ×3 IMPLANT
PAD ARMBOARD 7.5X6 YLW CONV (MISCELLANEOUS) ×6 IMPLANT
PADDING CAST ABS 6INX4YD NS (CAST SUPPLIES) ×2
PADDING CAST ABS COTTON 6X4 NS (CAST SUPPLIES) ×1 IMPLANT
PADDING CAST COTTON 6X4 STRL (CAST SUPPLIES) ×6 IMPLANT
SPLINT PLASTER CAST XFAST 5X30 (CAST SUPPLIES) ×1 IMPLANT
SPLINT PLASTER XFAST SET 5X30 (CAST SUPPLIES) ×2
SPONGE LAP 18X18 X RAY DECT (DISPOSABLE) ×3 IMPLANT
SPONGE SCRUB IODOPHOR (GAUZE/BANDAGES/DRESSINGS) ×3 IMPLANT
STAPLER VISISTAT 35W (STAPLE) IMPLANT
STOCKINETTE IMPERVIOUS LG (DRAPES) IMPLANT
STRIP CLOSURE SKIN 1/2X4 (GAUZE/BANDAGES/DRESSINGS) IMPLANT
SUCTION FRAZIER HANDLE 10FR (MISCELLANEOUS)
SUCTION TUBE FRAZIER 10FR DISP (MISCELLANEOUS) IMPLANT
SUT ETHILON 3 0 PS 1 (SUTURE) IMPLANT
SUT PDS AB 2-0 CT1 27 (SUTURE) IMPLANT
SUT VIC AB 0 CT1 27 (SUTURE)
SUT VIC AB 0 CT1 27XBRD ANBCTR (SUTURE) IMPLANT
SUT VIC AB 2-0 CT1 27 (SUTURE)
SUT VIC AB 2-0 CT1 TAPERPNT 27 (SUTURE) IMPLANT
SYR CONTROL 10ML LL (SYRINGE) IMPLANT
TOWEL OR 17X24 6PK STRL BLUE (TOWEL DISPOSABLE) ×6 IMPLANT
TOWEL OR 17X26 10 PK STRL BLUE (TOWEL DISPOSABLE) ×6 IMPLANT
TUBE CONNECTING 12'X1/4 (SUCTIONS) ×1
TUBE CONNECTING 12X1/4 (SUCTIONS) ×2 IMPLANT
UNDERPAD 30X30 (UNDERPADS AND DIAPERS) ×3 IMPLANT
WATER STERILE IRR 1000ML POUR (IV SOLUTION) IMPLANT
YANKAUER SUCT BULB TIP NO VENT (SUCTIONS) IMPLANT

## 2016-06-03 NOTE — Transfer of Care (Signed)
Immediate Anesthesia Transfer of Care Note  Patient: [name]  Procedure(s) Performed: Procedure(s): REMOVAL EXTERNAL FIXATION LEG (Bilateral)  Patient Location: PACU  Anesthesia Type:General  Level of Consciousness: awake, alert , oriented and patient cooperative  Airway & Oxygen Therapy: Patient Spontanous Breathing and Patient connected to nasal cannula oxygen  Post-op Assessment: Report given to RN and Post -op Vital signs reviewed and stable  Post vital signs: Reviewed and stable  Last Vitals:  Vitals:   06/03/16 1513 06/03/16 1518  BP:    Pulse:  (!) 124  Resp:  17  Temp: 36.3 C     Last Pain:  Vitals:   06/03/16 1523  TempSrc:   PainSc: 10-Worst pain ever      Patients Stated Pain Goal: 4 ([id number])  Complications: No apparent anesthesia complications
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
## 2016-06-03 NOTE — Progress Notes (Signed)
Was not able to finish note earlier because 4W discharged patient in the middle of me charting. Finally back in the system.

## 2016-06-03 NOTE — Progress Notes (Signed)
Wife at bedside, update given.

## 2016-06-03 NOTE — Anesthesia Preprocedure Evaluation (Addendum)
Anesthesia Evaluation  Patient identified by MRN, date of birth, ID band Patient awake    Reviewed: Allergy & Precautions, NPO status , Patient's Chart, lab work & pertinent test results  Airway Mallampati: I  TM Distance: >3 FB Neck ROM: Full   Comment: Pt with C2 fracture. Will need glidescope and neck kept neutral for intubation. Dental   Pulmonary Current Smoker,    Pulmonary exam normal        Cardiovascular hypertension, Pt. on medications Normal cardiovascular exam     Neuro/Psych    GI/Hepatic GERD  Medicated and Controlled,  Endo/Other  diabetes  Renal/GU      Musculoskeletal   Abdominal   Peds  Hematology   Anesthesia Other Findings   Reproductive/Obstetrics                            Anesthesia Physical Anesthesia Plan  ASA: III  Anesthesia Plan: General   Post-op Pain Management:    Induction: Intravenous  Airway Management Planned: Oral ETT and Video Laryngoscope Planned  Additional Equipment:   Intra-op Plan:   Post-operative Plan: Extubation in OR  Informed Consent: I have reviewed the patients History and Physical, chart, labs and discussed the procedure including the risks, benefits and alternatives for the proposed anesthesia with the patient or authorized representative who has indicated his/her understanding and acceptance.     Plan Discussed with: CRNA and Surgeon  Anesthesia Plan Comments:         Anesthesia Quick Evaluation

## 2016-06-03 NOTE — Progress Notes (Signed)
Helene Kelp RN-charge RN, called Lanny Hurst PA and update given.
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
## 2016-06-03 NOTE — Progress Notes (Signed)
LBM 11/23, refusing laxatives, Wants after OR. 2 PRN vicodin given at 2359 for complaint of left neck/shoulder pain. [name] A

## 2016-06-03 NOTE — Progress Notes (Signed)
Discharge summary job [id number]

## 2016-06-03 NOTE — Anesthesia Postprocedure Evaluation (Signed)
Anesthesia Post Note  Patient: [name]  Procedure(s) Performed: Procedure(s) (LRB): REMOVAL EXTERNAL FIXATION LEG (Bilateral)  Patient location during evaluation: PACU Anesthesia Type: General Level of consciousness: awake and alert Pain management: pain level controlled Vital Signs Assessment: post-procedure vital signs reviewed and stable Respiratory status: spontaneous breathing, nonlabored ventilation, respiratory function stable and patient connected to nasal cannula oxygen Cardiovascular status: blood pressure returned to baseline and stable Postop Assessment: no signs of nausea or vomiting Anesthetic complications: no    Last Vitals:  Vitals:   06/03/16 2135 06/03/16 2150  BP: 118/78 135/79  Pulse: 97 94  Resp: 12 10  Temp:      Last Pain:  Vitals:   06/03/16 2000  TempSrc:   PainSc: 10-Worst pain ever                 [name]

## 2016-06-03 NOTE — OR Nursing (Signed)
Called [name] [facility] with Dr. [name] about pt being discharged from rehab and [place] not knowing pt was supposed to go home. Pt has had 2mg  Diluadid, 12mg  Morphine and 10mg  Labetalol in PACU. Pt still hurting 8/10 on pain score. [name] reordered oral heart medicines for pt and is OK to discharge pt home if HR is below 110, SBP below 130 and DBP 90

## 2016-06-03 NOTE — Progress Notes (Signed)
Dr Orene Desanctis aware of continued HR and BP being up, orders noted

## 2016-06-03 NOTE — Progress Notes (Signed)
Talked to [place] PA for Dr. [name] about patient's BP and pain meds. Received [mophine 12mg  total ,dilaudid 2mg  total],suppose to go home from PACU, so called [facility] and talked to [facility], she said that patient is already discharged from rehab. Patient still showing that he is in [phone].

## 2016-06-03 NOTE — Brief Op Note (Signed)
04/30/2016 - 06/03/2016  3:30 PM  PATIENT:  Ozella Almond Kurtzman  55 y.o. male  PRE-OPERATIVE DIAGNOSIS:   1. Right ankle dislocation, open. 2. Right talus fracture, open. 3. Right subtalar dislocation, open. 4. Right calcaneus fracture, open. 5. Left ankle dislocation. 6. Left talus fracture. 7. Left subtalar dislocation. 8. Left calcaneus fracture. 9. Left navicular fracture dislocation.  POST-OPERATIVE DIAGNOSIS:   1. Right ankle dislocation, open. 2. Right talus fracture, open. 3. Right subtalar dislocation, open. 4. Right calcaneus fracture, open. 5. Left ankle dislocation. 6. Left talus fracture. 7. Left subtalar dislocation. 8. Left calcaneus fracture. 9. Left navicular fracture dislocation.  PROCEDURE:  Procedure(s): 1. REMOVAL EXTERNAL FIXATION LEG LEFT ANKLE AND FOOT 2. SHARP EXCISIONAL DEBRIDEMENT SKIN AND SUBCUTANEOUS TISSUE TIBIA AND HEEL 3. CURETTAGE OF ULCERATED PIN SITES TIBIA, CALCANEUS, AND METATARSALS, LEFT 4. REMOVAL OF SUPERFICIAL PIN LEFT MIDFOOT 5. REMOVAL OF SUPERFICIAL PINS RIGHT FOOT  6. APPLICATION OF SHORT LEG SPLINT, LEFT 7. APPLICATION OF SHORT LEG SPLINT, RIGHT 8. STRESS FLOURO, LEFT ANKLE AND FOOT FRACTURES 9. STRESS FLOURO, RIGHT ANKLE AND FOOT FRACTURES  SURGEON:  Surgeon(s) and Role:    * Altamese Stockton, MD - Primary  PHYSICIAN ASSISTANT: [name], PAC  ANESTHESIA:   general  EBL:  Total I/O In: 640 [P.O.:240; I.V.:400] Out: 10 [Blood:10]  BLOOD ADMINISTERED:none  DRAINS: none   LOCAL MEDICATIONS USED:  NONE  SPECIMEN:  No Specimen  DISPOSITION OF SPECIMEN:  N/A  COUNTS:  YES  TOURNIQUET:    DICTATION: .Other Dictation: Dictation Number [id number]  PLAN OF CARE: Discharge to home after PACU  PATIENT DISPOSITION:  PACU - hemodynamically stable.   Delay start of Pharmacological VTE agent (>24hrs) due to surgical blood loss or risk of bleeding: no

## 2016-06-03 NOTE — Progress Notes (Signed)
Speech Language Pathology Discharge Summary  Patient Details  Name: RAYGEN DAHM MRN: [id number] Date of Birth: 1960-09-20  Patient has met 8 of 8 long term goals.  Patient to discharge at overall Min;Mod level.   Reasons goals not met: N/A   Clinical Impression/Discharge Summary:  Patient has made excellent gains and has met 8 of 8 LTG's this admission due to improved cognitive and swallowing function and functional communication. Currently, patient demonstrates behaviors consistent with a Rancho Level VII and requires overall Min-Mod A cues to complete functional and familiar tasks safely in regards to intellectual awareness, functional problem solving, recall of new information and selective attention. Patient is currently consuming Dys. 3 textures with thin liquids with minimal overt s/s of aspiration and requires supervision verbal cues for use of an increased vocal intensity. Patient demonstrates the ability to express his basic wants/needs but requires Min-Mod A verbal cues for use of an increased vocal intensity. Patient's vocal quality is also hoarse at this time, therefore, patient is ~75-90% intelligible at the sentence level. Patient and family education is complete and patient will discharge home with 24 hour supervision from family. Patient would benefit from f/u SLP services to maximize his cognitive and swallowing function and overall speech intelligibility in order to reduce caregiver burden.    Care Partner:  Caregiver Able to Provide Assistance: Yes  Type of Caregiver Assistance: Physical;Cognitive  Recommendation:  Home Health SLP;24 hour supervision/assistance  Rationale for SLP Follow Up: Reduce caregiver burden;Maximize functional communication;Maximize cognitive function and independence;Maximize swallowing safety   Equipment: N/A   Reasons for discharge: Treatment goals met;Discharged from hospital   Patient/Family Agrees with Progress Made and Goals Achieved: Yes      [place], [place] 06/03/2016, 7:02 AM

## 2016-06-03 NOTE — Anesthesia Procedure Notes (Signed)
Procedure Name: Intubation Date/Time: 06/03/2016 2:06 PM Performed by: Oletta Lamas Pre-anesthesia Checklist: Patient identified, Emergency Drugs available, Suction available and Patient being monitored Patient Re-evaluated:Patient Re-evaluated prior to inductionOxygen Delivery Method: Circle System Utilized Preoxygenation: Pre-oxygenation with 100% oxygen Intubation Type: IV induction Laryngoscope Size: Glidescope Grade View: Grade I Tube type: Oral Tube size: 7.5 mm Number of attempts: 1 Airway Equipment and Method: Stylet Placement Confirmation: ETT inserted through vocal cords under direct vision,  positive ETCO2 and breath sounds checked- equal and bilateral Secured at: 24 cm Tube secured with: Tape Dental Injury: Teeth and Oropharynx as per pre-operative assessment  Comments: C-Collar remained in place for intubation.  Head and Neck remained midline with no extension or flexion.

## 2016-06-03 NOTE — Progress Notes (Signed)
Orthopedic Tech Progress Note Patient Details:  [name] February 25, 1961 [id number]  Ortho Devices Type of Ortho Device: CAM walker Ortho Device/Splint Location: Cam Walker for Both Left and Right Feet (Bilateral) Provided for future use. Ortho Device/Splint Interventions: Other (comment)   Kristopher Oppenheim 06/03/2016, 11:21 AM

## 2016-06-03 NOTE — Discharge Instructions (Signed)
Inpatient Rehab Discharge Instructions  [name] Discharge date and time: No discharge date for patient encounter.   Activities/Precautions/ Functional Status: Activity: Cervical collar at all times. Nonweightbearing lower extremities. Diet: Mechanical soft thin liquids Wound Care: Keep wound clean and dry Functional status:  ___ No restrictions     ___ Walk up steps independently ___ 24/7 supervision/assistance   ___ Walk up steps with assistance ___ Intermittent supervision/assistance  ___ Bathe/dress independently ___ Walk with walker     _x__ Bathe/dress with assistance ___ Walk Independently    ___ Shower independently ___ Walk with assistance    ___ Shower with assistance ___ No alcohol     ___ Return to work/school ________     COMMUNITY REFERRALS UPON DISCHARGE:    Home Health:   PT     OT     ST    RN                     Agency:  [facility] Phone: [phone]   Medical Equipment/Items Ordered:  Wheelchair, cushion                                                       Agency/Supplier:  [facility] [phone]    GENERAL COMMUNITY RESOURCES FOR PATIENT/FAMILY:  Support Groups:  Traumatic Brain Injury Support Group  (handout)        Special Instructions: No driving   My questions have been answered and I understand these instructions. I will adhere to these goals and the provided educational materials after my discharge from the hospital.  Patient/Caregiver Signature _______________________________ Date __________  Clinician Signature _______________________________________ Date __________  Please bring this form and your medication list with you to all your follow-up doctor's appointments.

## 2016-06-03 NOTE — Progress Notes (Signed)
Patient discharged from Rehab to surgery.  Patient supposed to discharge from surgery home, per report.  Patient spouse verbalized understanding of discharge instructions as given by Marlowe Sh[place], PA.  All patient belongings sent with patient, including DME.  Patient appears to be in no immediate distress at this time.  Brita Romp, RN

## 2016-06-04 ENCOUNTER — Telehealth: Payer: Self-pay | Admitting: *Deleted

## 2016-06-04 ENCOUNTER — Encounter (HOSPITAL_COMMUNITY): Payer: Self-pay | Admitting: Orthopedic Surgery

## 2016-06-04 DIAGNOSIS — T8484XD Pain due to internal orthopedic prosthetic devices, implants and grafts, subsequent encounter: Secondary | ICD-10-CM | POA: Diagnosis not present

## 2016-06-04 MED ORDER — KETOROLAC TROMETHAMINE 15 MG/ML IJ SOLN
15.0000 mg | Freq: Four times a day (QID) | INTRAMUSCULAR | Status: DC
  Administered 2016-06-04 (×3): 15 mg via INTRAVENOUS
  Filled 2016-06-04 (×3): qty 1

## 2016-06-04 MED ORDER — ONDANSETRON HCL 4 MG PO TABS: 4.0000 mg | ORAL_TABLET | Freq: Four times a day (QID) | ORAL | Status: DC | PRN

## 2016-06-04 MED ORDER — HYDROCODONE-ACETAMINOPHEN 5-325 MG PO TABS
1.0000 | ORAL_TABLET | ORAL | Status: DC | PRN
  Administered 2016-06-04: 1 via ORAL
  Filled 2016-06-04: qty 1

## 2016-06-04 MED ORDER — HYDROMORPHONE HCL 2 MG/ML IJ SOLN
1.0000 mg | INTRAMUSCULAR | Status: DC | PRN
  Filled 2016-06-04: qty 1

## 2016-06-04 MED ORDER — METOPROLOL TARTRATE 50 MG PO TABS
50.0000 mg | ORAL_TABLET | Freq: Two times a day (BID) | ORAL | Status: DC
  Administered 2016-06-04: 50 mg via ORAL
  Filled 2016-06-04: qty 1

## 2016-06-04 MED ORDER — HYDROMORPHONE HCL 2 MG/ML IJ SOLN
1.0000 mg | INTRAMUSCULAR | Status: DC | PRN
  Administered 2016-06-04 (×2): 2 mg via INTRAVENOUS
  Filled 2016-06-04 (×2): qty 1

## 2016-06-04 MED ORDER — ONDANSETRON HCL 4 MG/2ML IJ SOLN: 4.0000 mg | Freq: Four times a day (QID) | INTRAMUSCULAR | Status: DC | PRN

## 2016-06-04 MED ORDER — LACTATED RINGERS IV SOLN
INTRAVENOUS | Status: DC
  Administered 2016-06-04: 11:00:00 via INTRAVENOUS

## 2016-06-04 NOTE — Progress Notes (Signed)
Orthopaedic Trauma Service Progress Note  Subjective  Doing much better this am Pain controlled BP and HR stabilized   Computer issues overnight made it difficult to get pain meds All post op orders that were placed while pt was in pacu were automatically dc'd after pt transferred to acute side of hospital   Pt doing ok now  ROS As above   Objective   BP 121/78 (BP Location: Left Arm)   Pulse 91   Temp 98.1 F (36.7 C)   Resp 12   Wt 76.3 kg (168 lb 3.4 oz)   SpO2 98%   BMI 23.46 kg/m   Intake/Output      11/27 0701 - 11/28 0700 11/28 0701 - 11/29 0700   P.O. 120    I.V. (mL/kg) 700 (9.2)    Total Intake(mL/kg) 820 (10.7)    Urine (mL/kg/hr) 325 (0.2)    Blood 10 (0)    Total Output 335     Net +485            Labs  No labs   Exam  Gen: NAD, appears comfortable  Ext:       Bilateral lower Extremities  Splints c/d/i  Ext warm  No acute changes    Assessment and Plan   POD/HD#: 1   -B talo-calcaneal fracture dislocations, L talonavicular dislocation              s/p ex fix removal left ankle and removal of percutaneous pins B feet/ankles    NWB B LEx until office follow up    No dressing changes necessary until office follow up    Pt to bring CAM boots to office   Ice and elevate as needed   Bed to chair transfers  - C spine fractures (c2, c3, c5)  Collar at all times  Per NS  - Dysphagia   D3/thin diet  Megace  - HTN/tachycardia  Lopressor 50 mg q12h  vasotec 5mg  daily  - dispo  Dc home today  Follow up with ortho in 10 days   See CIR dc summary      Jari Pigg, PA-C [facility] [phone] (O) 06/04/2016 9:21 AM
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
## 2016-06-04 NOTE — Progress Notes (Deleted)
Orthopaedic Trauma Service Progress Note  Subjective  Doing much better this am Pain controlled BP and HR stabilized   Computer issues overnight made it difficult to get pain meds All post op orders that were placed while pt was in pacu were automatically dc'd after pt transferred to acute side of hospital   Pt doing ok now  ROS As above   Objective   BP 121/78 (BP Location: Left Arm)   Pulse 91   Temp 98.1 F (36.7 C)   Resp 12   Wt 76.3 kg (168 lb 3.4 oz)   SpO2 98%   BMI 23.46 kg/m   Intake/Output      11/27 0701 - 11/28 0700 11/28 0701 - 11/29 0700   P.O. 120    I.V. (mL/kg) 700 (9.2)    Total Intake(mL/kg) 820 (10.7)    Urine (mL/kg/hr) 325 (0.2)    Blood 10 (0)    Total Output 335     Net +485            Labs  No labs   Exam  Gen: NAD, appears comfortable  Ext:       Bilateral lower Extremities  Splints c/d/i  Ext warm  No acute changes    Assessment and Plan   POD/HD#: 1   -B talo-calcaneal fracture dislocations, L talonavicular dislocation              s/p ex fix removal left ankle and removal of percutaneous pins B feet/ankles    NWB B LEx until office follow up    No dressing changes necessary until office follow up    Pt to bring CAM boots to office   Ice and elevate as needed   Bed to chair transfers  - C spine fractures (c2, c3, c5)  Collar at all times  Per NS  - Dysphagia   D3/thin diet  Megace  - HTN/tachycardia  Lopressor 50 mg q12h  vasotec 5mg  daily  - dispo  Dc home today  Follow up with ortho in 10 days   See CIR dc summary      Jari Pigg, PA-C [facility] [phone] (O) 06/04/2016 9:09 AM

## 2016-06-04 NOTE — Discharge Instructions (Addendum)
Ortho discharge instructions  Nonweightbearing bilateral legs Do not remove splints until follow up  Keep splints clean and dry   Cervical collar on at all times
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
## 2016-06-04 NOTE — Progress Notes (Addendum)
Pulled foley waiting for pt to void, pushing fluids, pt is doing much better, pain is controlled and blood pressure

## 2016-06-04 NOTE — Progress Notes (Signed)
Reviewed discharge papers with wife and husband, wife states already has prescriptions from discharge on [place]

## 2016-06-04 NOTE — Progress Notes (Signed)
Discharge Note  The overall goal for the admission was met for:   Discharge location: Yes - transferred to acute for ortho surgery and then to d/c home with wife and family providing 24/7 assistance  Length of Stay: Yes  Discharge activity level: Yes - min assist w/c level  Home/community participation: Yes  Services provided included: MD, RD, PT, OT, SLP, RN, TR, Pharmacy, Neuropsych and SW  Financial Services: NONE at this time.  Wife pursuing Medicaid once financially eligible.  Follow-up services arranged: Home Health: RN, PT, OT, ST via [place], DME: 20x18 lightweight w/c with ELRs, cushion via [facility] and Patient/Family has no preference for HH/DME agencies  Comments (or additional information):  Assisted pt with medication x one month via [facility]  Patient/Family verbalized understanding of follow-up arrangements: Yes  Individual responsible for coordination of the follow-up plan: wife  Confirmed correct DME delivered: Lennart Pall 06/04/2016    Kaliope Quinonez

## 2016-06-04 NOTE — Telephone Encounter (Signed)
Transitional Care call, 1st attempt, left voicemail to have patient call us back

## 2016-06-05 NOTE — Telephone Encounter (Signed)
Transitional care call completed, appointment confirmed, address confirmed, new patient packet mailed  Transitional Care Questions  Questions for our staff to ask patients on Transitional care 48 hour phone call:   1. Are you/is patient experiencing any problems since coming home? No  Are there any questions regarding any aspect of care? No  2. Are there any questions regarding medications administration/dosing? No  Are meds being taken as prescribed? Yes Patient should review meds with caller to confirm   3. Have there been any falls? No  4. Has Home Health been to the house and/or have they contacted you? Yes If not, have you tried to contact them? Can we help you contact them?   5. Are bowels and bladder emptying properly? Yes  Are there any unexpected incontinence issues? No If applicable, is patient following bowel/bladder programs?  6. Any fevers, problems with breathing, unexpected pain? No   7. Are there any skin problems or new areas of breakdown? No, other than covered wound sites, caregiver instructed not to touch or remove  8. Has the patient/family member arranged specialty MD follow up (ie cardiology/neurology/renal/surgical/etc)?   Yes Can we help arrange?   9. Does the patient need any other services or support that we can help arrange? No  10. Are caregivers following through as expected in assisting the patient?  Yes, great family support system

## 2016-06-07 ENCOUNTER — Telehealth: Payer: Self-pay | Admitting: *Deleted

## 2016-06-07 MED ORDER — TOPIRAMATE 25 MG PO TABS: 25.0000 mg | ORAL_TABLET | Freq: Every day | ORAL | 2 refills | Status: DC

## 2016-06-07 NOTE — Telephone Encounter (Signed)
Patient contacted, Dr. Charm Barges message was delivered. Patient's wife asked if we could resubmit Rx to [facility]. Rx resent to [facility] pharmacy

## 2016-06-07 NOTE — Telephone Encounter (Signed)
[name] called about [name] having headaches for the past couple of nights- excruciating and pain medication is not helping and is asking what should be next step. Please advise.

## 2016-06-07 NOTE — Telephone Encounter (Signed)
Make sure she's checking his bp. But otherwise, I have sent in topamax which should help with post traumatic headaches and sleep. If he has any problems with this please call
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
## 2016-06-12 ENCOUNTER — Encounter: Payer: Self-pay | Attending: Physical Medicine & Rehabilitation | Admitting: Physical Medicine & Rehabilitation

## 2016-06-12 ENCOUNTER — Encounter (HOSPITAL_COMMUNITY): Payer: Self-pay | Admitting: General Practice

## 2016-06-12 ENCOUNTER — Inpatient Hospital Stay (HOSPITAL_COMMUNITY)
Admission: AD | Admit: 2016-06-12 | Discharge: 2016-06-18 | DRG: 465 | Disposition: A | Discharge status: Home Health | Payer: No Typology Code available for payment source | Source: Ambulatory Visit | Attending: Orthopedic Surgery | Admitting: Orthopedic Surgery

## 2016-06-12 DIAGNOSIS — F17210 Nicotine dependence, cigarettes, uncomplicated: Secondary | ICD-10-CM | POA: Diagnosis present

## 2016-06-12 DIAGNOSIS — E1169 Type 2 diabetes mellitus with other specified complication: Secondary | ICD-10-CM | POA: Diagnosis present

## 2016-06-12 DIAGNOSIS — Z79899 Other long term (current) drug therapy: Secondary | ICD-10-CM

## 2016-06-12 DIAGNOSIS — M86172 Other acute osteomyelitis, left ankle and foot: Principal | ICD-10-CM | POA: Diagnosis present

## 2016-06-12 DIAGNOSIS — Z930 Tracheostomy status: Secondary | ICD-10-CM

## 2016-06-12 DIAGNOSIS — Z7982 Long term (current) use of aspirin: Secondary | ICD-10-CM | POA: Diagnosis not present

## 2016-06-12 DIAGNOSIS — S92902B Unspecified fracture of left foot, initial encounter for open fracture: Secondary | ICD-10-CM | POA: Diagnosis present

## 2016-06-12 DIAGNOSIS — Z8739 Personal history of other diseases of the musculoskeletal system and connective tissue: Secondary | ICD-10-CM | POA: Diagnosis present

## 2016-06-12 DIAGNOSIS — K219 Gastro-esophageal reflux disease without esophagitis: Secondary | ICD-10-CM | POA: Diagnosis present

## 2016-06-12 DIAGNOSIS — E559 Vitamin D deficiency, unspecified: Secondary | ICD-10-CM | POA: Diagnosis present

## 2016-06-12 DIAGNOSIS — B9561 Methicillin susceptible Staphylococcus aureus infection as the cause of diseases classified elsewhere: Secondary | ICD-10-CM | POA: Diagnosis present

## 2016-06-12 DIAGNOSIS — S12100D Unspecified displaced fracture of second cervical vertebra, subsequent encounter for fracture with routine healing: Secondary | ICD-10-CM | POA: Diagnosis not present

## 2016-06-12 DIAGNOSIS — R7303 Prediabetes: Secondary | ICD-10-CM | POA: Diagnosis present

## 2016-06-12 DIAGNOSIS — G8929 Other chronic pain: Secondary | ICD-10-CM | POA: Diagnosis present

## 2016-06-12 DIAGNOSIS — S12400D Unspecified displaced fracture of fifth cervical vertebra, subsequent encounter for fracture with routine healing: Secondary | ICD-10-CM | POA: Diagnosis not present

## 2016-06-12 DIAGNOSIS — S92901A Unspecified fracture of right foot, initial encounter for closed fracture: Secondary | ICD-10-CM | POA: Diagnosis present

## 2016-06-12 DIAGNOSIS — E785 Hyperlipidemia, unspecified: Secondary | ICD-10-CM | POA: Diagnosis present

## 2016-06-12 DIAGNOSIS — I10 Essential (primary) hypertension: Secondary | ICD-10-CM | POA: Diagnosis present

## 2016-06-12 DIAGNOSIS — J449 Chronic obstructive pulmonary disease, unspecified: Secondary | ICD-10-CM | POA: Diagnosis present

## 2016-06-12 DIAGNOSIS — S12100A Unspecified displaced fracture of second cervical vertebra, initial encounter for closed fracture: Secondary | ICD-10-CM | POA: Diagnosis present

## 2016-06-12 DIAGNOSIS — S12200A Unspecified displaced fracture of third cervical vertebra, initial encounter for closed fracture: Secondary | ICD-10-CM | POA: Diagnosis present

## 2016-06-12 DIAGNOSIS — S12200D Unspecified displaced fracture of third cervical vertebra, subsequent encounter for fracture with routine healing: Secondary | ICD-10-CM

## 2016-06-12 DIAGNOSIS — R7309 Other abnormal glucose: Secondary | ICD-10-CM | POA: Diagnosis present

## 2016-06-12 DIAGNOSIS — S12400A Unspecified displaced fracture of fifth cervical vertebra, initial encounter for closed fracture: Secondary | ICD-10-CM | POA: Diagnosis present

## 2016-06-12 DIAGNOSIS — Z419 Encounter for procedure for purposes other than remedying health state, unspecified: Secondary | ICD-10-CM

## 2016-06-12 DIAGNOSIS — E782 Mixed hyperlipidemia: Secondary | ICD-10-CM | POA: Diagnosis present

## 2016-06-12 HISTORY — DX: Chronic obstructive pulmonary disease, unspecified: J44.9

## 2016-06-12 HISTORY — DX: Headache, unspecified: R51.9

## 2016-06-12 HISTORY — DX: Other chronic pain: G89.29

## 2016-06-12 HISTORY — DX: Headache: R51

## 2016-06-12 HISTORY — DX: Personal history of other diseases of the musculoskeletal system and connective tissue: Z87.39

## 2016-06-12 LAB — CBC WITH DIFFERENTIAL/PLATELET
Basophils Absolute: 0 10*3/uL (ref 0.0–0.1)
Basophils Relative: 0 %
Eosinophils Absolute: 0.1 10*3/uL (ref 0.0–0.7)
Eosinophils Relative: 1 %
HCT: 37.8 % — ABNORMAL LOW (ref 39.0–52.0)
Hemoglobin: 12.2 g/dL — ABNORMAL LOW (ref 13.0–17.0)
Lymphocytes Relative: 21 %
Lymphs Abs: 2.5 10*3/uL (ref 0.7–4.0)
MCH: 26.9 pg (ref 26.0–34.0)
MCHC: 32.3 g/dL (ref 30.0–36.0)
MCV: 83.4 fL (ref 78.0–100.0)
Monocytes Absolute: 0.8 10*3/uL (ref 0.1–1.0)
Monocytes Relative: 7 %
Neutro Abs: 8.2 10*3/uL — ABNORMAL HIGH (ref 1.7–7.7)
Neutrophils Relative %: 71 %
Platelets: 424 10*3/uL — ABNORMAL HIGH (ref 150–400)
RBC: 4.53 MIL/uL (ref 4.22–5.81)
RDW: 15.4 % (ref 11.5–15.5)
WBC: 11.6 10*3/uL — ABNORMAL HIGH (ref 4.0–10.5)

## 2016-06-12 LAB — PROTIME-INR
INR: 1.18
Prothrombin Time: 15.1 seconds (ref 11.4–15.2)

## 2016-06-12 LAB — C-REACTIVE PROTEIN: CRP: 7.6 mg/dL — ABNORMAL HIGH (ref <1.0)

## 2016-06-12 LAB — COMPREHENSIVE METABOLIC PANEL
ALT: 21 U/L (ref 17–63)
AST: 14 U/L — ABNORMAL LOW (ref 15–41)
Albumin: 2.7 g/dL — ABNORMAL LOW (ref 3.5–5.0)
Alkaline Phosphatase: 73 U/L (ref 38–126)
Anion gap: 9 (ref 5–15)
BUN: 17 mg/dL (ref 6–20)
CO2: 24 mmol/L (ref 22–32)
Calcium: 9.7 mg/dL (ref 8.9–10.3)
Chloride: 102 mmol/L (ref 101–111)
Creatinine, Ser: 0.58 mg/dL — ABNORMAL LOW (ref 0.61–1.24)
GFR calc Af Amer: >60 mL/min (ref >60)
GFR calc non Af Amer: >60 mL/min (ref >60)
Glucose, Bld: 90 mg/dL (ref 65–99)
Potassium: 4 mmol/L (ref 3.5–5.1)
Sodium: 135 mmol/L (ref 135–145)
Total Bilirubin: 0.5 mg/dL (ref 0.3–1.2)
Total Protein: 7.1 g/dL (ref 6.5–8.1)

## 2016-06-12 MED ORDER — VITAMIN D 1000 UNITS PO TABS
1000.0000 [IU] | ORAL_TABLET | Freq: Every day | ORAL | Status: DC
  Administered 2016-06-13 – 2016-06-18 (×5): 1000 [IU] via ORAL
  Filled 2016-06-12 (×5): qty 1

## 2016-06-12 MED ORDER — PIPERACILLIN-TAZOBACTAM 3.375 G IVPB
3.3750 g | Freq: Three times a day (TID) | INTRAVENOUS | Status: DC
  Administered 2016-06-12 – 2016-06-17 (×14): 3.375 g via INTRAVENOUS
  Filled 2016-06-12 (×18): qty 50

## 2016-06-12 MED ORDER — HYDROCODONE-ACETAMINOPHEN 5-325 MG PO TABS
1.0000 | ORAL_TABLET | ORAL | Status: DC | PRN
Start: 2016-06-12 — End: 2016-06-17
  Administered 2016-06-12: 1 via ORAL
  Administered 2016-06-13 (×2): 2 via ORAL
  Administered 2016-06-13: 1 via ORAL
  Administered 2016-06-13 – 2016-06-17 (×12): 2 via ORAL
  Filled 2016-06-12 (×9): qty 2
  Filled 2016-06-12: qty 1
  Filled 2016-06-12 (×5): qty 2

## 2016-06-12 MED ORDER — ACETAMINOPHEN 325 MG PO TABS: 650.0000 mg | ORAL_TABLET | Freq: Four times a day (QID) | ORAL | Status: DC | PRN

## 2016-06-12 MED ORDER — ENSURE ENLIVE PO LIQD
237.0000 mL | Freq: Two times a day (BID) | ORAL | Status: DC
  Administered 2016-06-13 – 2016-06-18 (×7): 237 mL via ORAL

## 2016-06-12 MED ORDER — PANTOPRAZOLE SODIUM 40 MG PO TBEC
80.0000 mg | DELAYED_RELEASE_TABLET | Freq: Every day | ORAL | Status: DC
  Administered 2016-06-13 – 2016-06-18 (×5): 80 mg via ORAL
  Filled 2016-06-12 (×6): qty 2

## 2016-06-12 MED ORDER — VANCOMYCIN HCL IN DEXTROSE 750-5 MG/150ML-% IV SOLN
750.0000 mg | Freq: Three times a day (TID) | INTRAVENOUS | Status: DC
  Administered 2016-06-12 – 2016-06-13 (×3): 750 mg via INTRAVENOUS
  Filled 2016-06-12 (×5): qty 150

## 2016-06-12 MED ORDER — POLYETHYLENE GLYCOL 3350 17 G PO PACK
17.0000 g | PACK | Freq: Every day | ORAL | Status: DC
  Administered 2016-06-13 – 2016-06-18 (×4): 17 g
  Filled 2016-06-12 (×4): qty 1

## 2016-06-12 MED ORDER — METHYLPHENIDATE HCL 5 MG PO TABS
5.0000 mg | ORAL_TABLET | Freq: Two times a day (BID) | ORAL | Status: DC
  Administered 2016-06-13 – 2016-06-18 (×9): 5 mg via ORAL
  Filled 2016-06-12 (×10): qty 1

## 2016-06-12 MED ORDER — ACETAMINOPHEN 650 MG RE SUPP: 650.0000 mg | Freq: Four times a day (QID) | RECTAL | Status: DC | PRN

## 2016-06-12 MED ORDER — CYCLOBENZAPRINE HCL 10 MG PO TABS
10.0000 mg | ORAL_TABLET | Freq: Every day | ORAL | Status: DC | PRN
  Administered 2016-06-12 – 2016-06-13 (×2): 10 mg via ORAL
  Filled 2016-06-12 (×4): qty 1

## 2016-06-12 MED ORDER — TOPIRAMATE 25 MG PO TABS
25.0000 mg | ORAL_TABLET | Freq: Every day | ORAL | Status: DC
  Administered 2016-06-12 – 2016-06-17 (×6): 25 mg via ORAL
  Filled 2016-06-12 (×6): qty 1

## 2016-06-12 MED ORDER — ALBUTEROL SULFATE (2.5 MG/3ML) 0.083% IN NEBU
3.0000 mL | INHALATION_SOLUTION | Freq: Four times a day (QID) | RESPIRATORY_TRACT | Status: DC | PRN
  Administered 2016-06-16: 3 mL via RESPIRATORY_TRACT
  Filled 2016-06-12: qty 3

## 2016-06-12 MED ORDER — METOPROLOL TARTRATE 50 MG PO TABS
50.0000 mg | ORAL_TABLET | Freq: Two times a day (BID) | ORAL | Status: DC
  Administered 2016-06-12 – 2016-06-18 (×11): 50 mg via ORAL
  Filled 2016-06-12 (×12): qty 1

## 2016-06-12 MED ORDER — HYDROMORPHONE HCL 2 MG/ML IJ SOLN: 1.0000 mg | INTRAMUSCULAR | Status: DC | PRN

## 2016-06-12 MED ORDER — POTASSIUM CHLORIDE IN NACL 20-0.9 MEQ/L-% IV SOLN
INTRAVENOUS | Status: DC
  Administered 2016-06-12 – 2016-06-14 (×4): via INTRAVENOUS
  Administered 2016-06-15 – 2016-06-17 (×3): 1000 mL via INTRAVENOUS
  Filled 2016-06-12 (×7): qty 1000

## 2016-06-12 MED ORDER — ENALAPRIL MALEATE 5 MG PO TABS
5.0000 mg | ORAL_TABLET | Freq: Every day | ORAL | Status: DC
  Administered 2016-06-13 – 2016-06-18 (×5): 5 mg via ORAL
  Filled 2016-06-12 (×6): qty 1

## 2016-06-12 MED ORDER — ADULT MULTIVITAMIN W/MINERALS CH
1.0000 | ORAL_TABLET | Freq: Every day | ORAL | Status: DC
  Administered 2016-06-13 – 2016-06-18 (×5): 1 via ORAL
  Filled 2016-06-12 (×5): qty 1

## 2016-06-12 NOTE — Progress Notes (Signed)
Pharmacy Antibiotic Note  [name] is a 55 y.o. male admitted on 06/12/2016 with osteomyelitis.  Pharmacy has been consulted for vancomycin and zosyn dosing.  Patient discharged from CIR last week and had his ex fix removed, he was seen outpatient this am and noted to have a wound infection to his L medial foot he is being admitted for IV antibiotics. Baseline labs and vitals are pending.   His last scr was normal. Will give initial doses now and follow up baseline labs.  Plan: Vancomycin 750 IV every 8 hours.  Goal trough 15-20 mcg/mL. Zosyn 3.375g IV q8h (4 hour infusion).    No data recorded.  No results for input(s): WBC, CREATININE, LATICACIDVEN, VANCOTROUGH, VANCOPEAK, VANCORANDOM, GENTTROUGH, GENTPEAK, GENTRANDOM, TOBRATROUGH, TOBRAPEAK, TOBRARND, AMIKACINPEAK, AMIKACINTROU, AMIKACIN in the last 168 hours.  Estimated Creatinine Clearance: 109.8 mL/min (by C-G formula based on SCr of 0.49 mg/dL (L)).    No Known Allergies  Antimicrobials this admission: Vancomycin 12/6>> Zosyn 12/6>>  Dose adjustments this admission:   Microbiology results:   Thank you for allowing pharmacy to be a part of this patient's care.  Erin Hearing PharmD., BCPS Clinical Pharmacist Pager [phone] 06/12/2016 6:00 PM
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
## 2016-06-12 NOTE — Progress Notes (Signed)
Patient admitted directly from Dr. Carlean Jews office with infection of Left medial foot. Patient alert and oriented X4, denies pain at this time, vitals stable. Bilateral ace bandage wraps applied by Dr. [name] at the office. Patient's daughter showed me pictures of wounds prior to the wrapping and does not want me to unwrap wounds to see the site at this time. Incision to left medial foot red and swollen (per pictures). No other skin issues noted. Oriented to room and call bell system. Will continue to follow. [name]

## 2016-06-13 LAB — SEDIMENTATION RATE: Sed Rate: 105 mm/hr — ABNORMAL HIGH (ref 0–16)

## 2016-06-13 LAB — VANCOMYCIN, TROUGH: Vancomycin Tr: 12 ug/mL — ABNORMAL LOW (ref 15–20)

## 2016-06-13 MED ORDER — HYDROMORPHONE HCL 2 MG/ML IJ SOLN
1.0000 mg | INTRAMUSCULAR | Status: DC | PRN
  Administered 2016-06-13 – 2016-06-14 (×3): 2 mg via INTRAVENOUS
  Filled 2016-06-13 (×3): qty 1

## 2016-06-13 MED ORDER — DOCUSATE SODIUM 100 MG PO CAPS
100.0000 mg | ORAL_CAPSULE | Freq: Two times a day (BID) | ORAL | Status: DC
  Administered 2016-06-13 – 2016-06-18 (×10): 100 mg via ORAL
  Filled 2016-06-13 (×10): qty 1

## 2016-06-13 MED ORDER — VANCOMYCIN HCL IN DEXTROSE 1-5 GM/200ML-% IV SOLN
1000.0000 mg | Freq: Three times a day (TID) | INTRAVENOUS | Status: DC
  Administered 2016-06-13 – 2016-06-16 (×8): 1000 mg via INTRAVENOUS
  Filled 2016-06-13 (×11): qty 200
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
## 2016-06-13 NOTE — Progress Notes (Signed)
[facility]  Patient Status: Active (receiving services up to time of hospitalization)  [facility] is providing the following services: PT, OT and ST  If patient discharges after hours, please call [phone].   [name] 06/13/2016, 9:42 AM
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
## 2016-06-13 NOTE — Progress Notes (Signed)
Pharmacy Antibiotic Note  [name] is a 55 y.o. male admitted on 06/12/2016 with osteomyelitis.of left foot.  Pharmacy has been consulted for Vancomycin and Zosyn dosing.    Patient discharged from CIR last week and had his external fixator removed.  He was seen outpatient on 06/12/16 and noted to have a wound infection to his L medial foot he was admitted for IV antibiotics.  For I&D on 06/14/16.   Vanc trough level tonight is 12 mcg/ml, below target range, on Vanc 750 mg IV q8hrs.  Last dose was given late, so true trough is even lower.  Expecting 4-6 week course of antibiotics. ID to see.  Plan:  Increase Vancomycin from 750 mg to 1 gm IV q8hrs..  Will plan to re-check vanc trough level at steady-state.  Target vanc trough levels 15-20 mcg/ml.  Continues on Zosyn 3.375 gm IV q8hrs (each over 4 hrs)  Will follow renal function, any culture data, progress and antibiotic plans.  Height: 5\' 6"  (167.6 cm) Weight: 177 lb 6.4 oz (80.5 kg) IBW/kg (Calculated) : 63.8  Temp (24hrs), Avg:98 F (36.7 C), Min:97.6 F (36.4 C), Max:98.5 F (36.9 C)   Recent Labs Lab 06/12/16 1833 06/13/16 1848  WBC 11.6*  --   CREATININE 0.58*  --   VANCOTROUGH  --  12*    Estimated Creatinine Clearance: 102.8 mL/min (by C-G formula based on SCr of 0.58 mg/dL (L)).    Allergies  Allergen Reactions  . No Known Allergies     Antimicrobials this admission:  Vancomycin 12/6>>  Zosyn 12/6>>  Dose adjustments this admission:  12/7: Vanc trough 12 mcg/ml on 750 mg IV q8h (last dose before trough level given ~2 hrs late) -> dose increased to 1 gm IV q8hrs.  Microbiology results:  no cultures during this admission   Thank you for allowing pharmacy to be a part of this patient's care.  Arty Baumgartner, [place] Pager: [phone] 06/13/2016 7:58 PM

## 2016-06-14 ENCOUNTER — Encounter (HOSPITAL_COMMUNITY): Payer: Self-pay | Admitting: Certified Registered Nurse Anesthetist

## 2016-06-14 ENCOUNTER — Inpatient Hospital Stay (HOSPITAL_COMMUNITY): Payer: No Typology Code available for payment source

## 2016-06-14 ENCOUNTER — Encounter (HOSPITAL_COMMUNITY)
Admission: AD | Disposition: A | Discharge status: Home Health | Payer: Self-pay | Source: Ambulatory Visit | Attending: Orthopedic Surgery

## 2016-06-14 ENCOUNTER — Inpatient Hospital Stay (HOSPITAL_COMMUNITY): Payer: No Typology Code available for payment source | Admitting: Certified Registered Nurse Anesthetist

## 2016-06-14 HISTORY — PX: I & D EXTREMITY: SHX5045

## 2016-06-14 HISTORY — PX: I&D EXTREMITY: SHX5045

## 2016-06-14 LAB — SURGICAL PCR SCREEN
MRSA, PCR: NEGATIVE
Staphylococcus aureus: POSITIVE — AB

## 2016-06-14 SURGERY — IRRIGATION AND DEBRIDEMENT EXTREMITY
Anesthesia: Regional | Laterality: Left

## 2016-06-14 MED ORDER — ONDANSETRON HCL 4 MG/2ML IJ SOLN
INTRAMUSCULAR | Status: AC
Start: 2016-06-14 — End: 2016-06-14
  Filled 2016-06-14: qty 2

## 2016-06-14 MED ORDER — MUPIROCIN 2 % EX OINT
1.0000 "application " | TOPICAL_OINTMENT | Freq: Two times a day (BID) | CUTANEOUS | Status: DC
  Administered 2016-06-14 – 2016-06-18 (×8): 1 via NASAL
  Filled 2016-06-14 (×2): qty 22

## 2016-06-14 MED ORDER — GLYCOPYRROLATE 0.2 MG/ML IV SOSY
PREFILLED_SYRINGE | INTRAVENOUS | Status: AC
  Filled 2016-06-14: qty 3

## 2016-06-14 MED ORDER — OXYCODONE HCL 5 MG PO TABS
5.0000 mg | ORAL_TABLET | ORAL | Status: DC | PRN
  Administered 2016-06-15 – 2016-06-17 (×5): 10 mg via ORAL
  Filled 2016-06-14 (×5): qty 2

## 2016-06-14 MED ORDER — GLYCOPYRROLATE 0.2 MG/ML IJ SOLN
INTRAMUSCULAR | Status: DC | PRN
  Administered 2016-06-14: 0.2 mg via INTRAVENOUS

## 2016-06-14 MED ORDER — VANCOMYCIN HCL 1000 MG IV SOLR
INTRAVENOUS | Status: AC
  Filled 2016-06-14: qty 1000

## 2016-06-14 MED ORDER — MIDAZOLAM HCL 2 MG/2ML IJ SOLN
INTRAMUSCULAR | Status: DC | PRN
  Administered 2016-06-14 (×2): 1 mg via INTRAVENOUS

## 2016-06-14 MED ORDER — LACTATED RINGERS IV SOLN: INTRAVENOUS | Status: DC

## 2016-06-14 MED ORDER — MEPERIDINE HCL 25 MG/ML IJ SOLN: 6.2500 mg | INTRAMUSCULAR | Status: DC | PRN

## 2016-06-14 MED ORDER — HYDROMORPHONE HCL 2 MG/ML IJ SOLN
INTRAMUSCULAR | Status: AC
  Filled 2016-06-14: qty 1

## 2016-06-14 MED ORDER — PROPOFOL 10 MG/ML IV BOLUS
INTRAVENOUS | Status: DC | PRN
  Administered 2016-06-14: 50 mg via INTRAVENOUS
  Administered 2016-06-14: 30 mg via INTRAVENOUS
  Administered 2016-06-14: 50 mg via INTRAVENOUS
  Administered 2016-06-14: 164 mg via INTRAVENOUS

## 2016-06-14 MED ORDER — 0.9 % SODIUM CHLORIDE (POUR BTL) OPTIME
TOPICAL | Status: DC | PRN
  Administered 2016-06-14: 1000 mL

## 2016-06-14 MED ORDER — LIDOCAINE HCL (CARDIAC) 20 MG/ML IV SOLN
INTRAVENOUS | Status: DC | PRN
  Administered 2016-06-14: 50 mg via INTRATRACHEAL

## 2016-06-14 MED ORDER — ONDANSETRON HCL 4 MG/2ML IJ SOLN
INTRAMUSCULAR | Status: DC | PRN
  Administered 2016-06-14: 4 mg via INTRAVENOUS

## 2016-06-14 MED ORDER — SODIUM CHLORIDE 0.9 % IR SOLN
Status: DC | PRN
  Administered 2016-06-14: 3000 mL

## 2016-06-14 MED ORDER — TOBRAMYCIN SULFATE 1.2 G IJ SOLR
INTRAMUSCULAR | Status: AC
  Filled 2016-06-14: qty 1.2

## 2016-06-14 MED ORDER — MIDAZOLAM HCL 2 MG/2ML IJ SOLN
INTRAMUSCULAR | Status: AC
  Filled 2016-06-14: qty 2

## 2016-06-14 MED ORDER — PROMETHAZINE HCL 25 MG/ML IJ SOLN: 6.2500 mg | INTRAMUSCULAR | Status: DC | PRN

## 2016-06-14 MED ORDER — TOBRAMYCIN SULFATE 1.2 G IJ SOLR
INTRAMUSCULAR | Status: DC | PRN
  Administered 2016-06-14: 1.2 g

## 2016-06-14 MED ORDER — FENTANYL CITRATE (PF) 100 MCG/2ML IJ SOLN
INTRAMUSCULAR | Status: AC
  Filled 2016-06-14: qty 2

## 2016-06-14 MED ORDER — PROPOFOL 500 MG/50ML IV EMUL
INTRAVENOUS | Status: DC | PRN
  Administered 2016-06-14: 25 ug/kg/min via INTRAVENOUS

## 2016-06-14 MED ORDER — HYDROMORPHONE HCL 2 MG/ML IJ SOLN
1.0000 mg | INTRAMUSCULAR | Status: DC | PRN
  Administered 2016-06-15 – 2016-06-16 (×4): 2 mg via INTRAVENOUS
  Filled 2016-06-14 (×4): qty 1

## 2016-06-14 MED ORDER — PROPOFOL 10 MG/ML IV BOLUS
INTRAVENOUS | Status: AC
  Filled 2016-06-14: qty 20

## 2016-06-14 MED ORDER — FENTANYL CITRATE (PF) 100 MCG/2ML IJ SOLN
INTRAMUSCULAR | Status: DC | PRN
  Administered 2016-06-14: 50 ug via INTRAVENOUS
  Administered 2016-06-14 (×2): 25 ug via INTRAVENOUS

## 2016-06-14 MED ORDER — PHENYLEPHRINE HCL 10 MG/ML IJ SOLN
INTRAMUSCULAR | Status: DC | PRN
  Administered 2016-06-14: 120 ug via INTRAVENOUS
  Administered 2016-06-14: 80 ug via INTRAVENOUS

## 2016-06-14 MED ORDER — PHENYLEPHRINE 40 MCG/ML (10ML) SYRINGE FOR IV PUSH (FOR BLOOD PRESSURE SUPPORT)
PREFILLED_SYRINGE | INTRAVENOUS | Status: AC
  Filled 2016-06-14: qty 10

## 2016-06-14 MED ORDER — CHLORHEXIDINE GLUCONATE CLOTH 2 % EX PADS
6.0000 | MEDICATED_PAD | Freq: Every day | CUTANEOUS | Status: AC
  Administered 2016-06-14 – 2016-06-18 (×5): 6 via TOPICAL

## 2016-06-14 MED ORDER — ARTIFICIAL TEARS OP OINT
TOPICAL_OINTMENT | OPHTHALMIC | Status: AC
  Filled 2016-06-14: qty 3.5

## 2016-06-14 MED ORDER — LIDOCAINE 2% (20 MG/ML) 5 ML SYRINGE
INTRAMUSCULAR | Status: AC
  Filled 2016-06-14: qty 5

## 2016-06-14 MED ORDER — LACTATED RINGERS IV SOLN
INTRAVENOUS | Status: DC
  Administered 2016-06-14 (×2): via INTRAVENOUS

## 2016-06-14 MED ORDER — VANCOMYCIN HCL 1000 MG IV SOLR
INTRAVENOUS | Status: DC | PRN
  Administered 2016-06-14: 1000 mg via TOPICAL

## 2016-06-14 MED ORDER — HYDROCODONE-ACETAMINOPHEN 5-325 MG PO TABS
ORAL_TABLET | ORAL | Status: AC
  Administered 2016-06-14: 2 via ORAL
  Filled 2016-06-14: qty 2

## 2016-06-14 MED ORDER — HYDROMORPHONE HCL 1 MG/ML IJ SOLN
0.2500 mg | INTRAMUSCULAR | Status: DC | PRN
  Administered 2016-06-14 (×2): 0.5 mg via INTRAVENOUS

## 2016-06-14 SURGICAL SUPPLY — 55 items
BANDAGE ACE 4X5 VEL STRL LF (GAUZE/BANDAGES/DRESSINGS) ×3 IMPLANT
BLADE SURG 10 STRL SS (BLADE) ×3 IMPLANT
BNDG COHESIVE 4X5 TAN STRL (GAUZE/BANDAGES/DRESSINGS) IMPLANT
BNDG GAUZE ELAST 4 BULKY (GAUZE/BANDAGES/DRESSINGS) ×3 IMPLANT
BNDG GAUZE STRTCH 6 (GAUZE/BANDAGES/DRESSINGS) IMPLANT
BRUSH SCRUB DISP (MISCELLANEOUS) ×6 IMPLANT
CANISTER WOUND CARE 500ML ATS (WOUND CARE) IMPLANT
COVER SURGICAL LIGHT HANDLE (MISCELLANEOUS) ×3 IMPLANT
DRAPE ORTHO SPLIT 77X108 STRL (DRAPES) ×2
DRAPE SURG ORHT 6 SPLT 77X108 (DRAPES) ×1 IMPLANT
DRAPE U-SHAPE 47X51 STRL (DRAPES) ×3 IMPLANT
DRSG ADAPTIC 3X8 NADH LF (GAUZE/BANDAGES/DRESSINGS) IMPLANT
DRSG MEPITEL 4X7.2 (GAUZE/BANDAGES/DRESSINGS) ×3 IMPLANT
DRSG PAD ABDOMINAL 8X10 ST (GAUZE/BANDAGES/DRESSINGS) ×3 IMPLANT
DRSG VAC ATS SM SENSATRAC (GAUZE/BANDAGES/DRESSINGS) ×3 IMPLANT
ELECT CAUTERY BLADE 6.4 (BLADE) IMPLANT
ELECT REM PT RETURN 9FT ADLT (ELECTROSURGICAL)
ELECTRODE REM PT RTRN 9FT ADLT (ELECTROSURGICAL) IMPLANT
GAUZE SPONGE 4X4 12PLY STRL (GAUZE/BANDAGES/DRESSINGS) ×3 IMPLANT
GLOVE BIO SURGEON STRL SZ7.5 (GLOVE) ×3 IMPLANT
GLOVE BIO SURGEON STRL SZ8 (GLOVE) ×3 IMPLANT
GLOVE BIOGEL PI IND STRL 7.5 (GLOVE) ×1 IMPLANT
GLOVE BIOGEL PI IND STRL 8 (GLOVE) ×1 IMPLANT
GLOVE BIOGEL PI INDICATOR 7.5 (GLOVE) ×2
GLOVE BIOGEL PI INDICATOR 8 (GLOVE) ×2
GOWN STRL REUS W/ TWL LRG LVL3 (GOWN DISPOSABLE) ×2 IMPLANT
GOWN STRL REUS W/ TWL XL LVL3 (GOWN DISPOSABLE) ×1 IMPLANT
GOWN STRL REUS W/TWL LRG LVL3 (GOWN DISPOSABLE) ×4
GOWN STRL REUS W/TWL XL LVL3 (GOWN DISPOSABLE) ×2
HANDPIECE INTERPULSE COAX TIP (DISPOSABLE)
IV NS IRRIG 3000ML ARTHROMATIC (IV SOLUTION) ×6 IMPLANT
KIT BASIN OR (CUSTOM PROCEDURE TRAY) ×3 IMPLANT
KIT ROOM TURNOVER OR (KITS) ×3 IMPLANT
KIT STIMULAN RAPID CURE  10CC (Orthopedic Implant) ×2 IMPLANT
KIT STIMULAN RAPID CURE 10CC (Orthopedic Implant) ×1 IMPLANT
MANIFOLD NEPTUNE II (INSTRUMENTS) ×3 IMPLANT
NS IRRIG 1000ML POUR BTL (IV SOLUTION) ×3 IMPLANT
PACK ORTHO EXTREMITY (CUSTOM PROCEDURE TRAY) ×3 IMPLANT
PAD ARMBOARD 7.5X6 YLW CONV (MISCELLANEOUS) ×6 IMPLANT
PADDING CAST COTTON 6X4 STRL (CAST SUPPLIES) IMPLANT
SET HNDPC FAN SPRY TIP SCT (DISPOSABLE) IMPLANT
SPONGE GAUZE 4X4 12PLY STER LF (GAUZE/BANDAGES/DRESSINGS) ×3 IMPLANT
SPONGE LAP 18X18 X RAY DECT (DISPOSABLE) ×3 IMPLANT
STOCKINETTE IMPERVIOUS 9X36 MD (GAUZE/BANDAGES/DRESSINGS) IMPLANT
SUCTION FRAZIER HANDLE 10FR (MISCELLANEOUS) ×2
SUCTION TUBE FRAZIER 10FR DISP (MISCELLANEOUS) ×1 IMPLANT
SUT PDS AB 2-0 CT1 27 (SUTURE) IMPLANT
TOWEL OR 17X24 6PK STRL BLUE (TOWEL DISPOSABLE) ×3 IMPLANT
TOWEL OR 17X26 10 PK STRL BLUE (TOWEL DISPOSABLE) ×6 IMPLANT
TUBE ANAEROBIC SPECIMEN COL (MISCELLANEOUS) IMPLANT
TUBE CONNECTING 12'X1/4 (SUCTIONS) ×1
TUBE CONNECTING 12X1/4 (SUCTIONS) ×2 IMPLANT
UNDERPAD 30X30 (UNDERPADS AND DIAPERS) ×3 IMPLANT
WATER STERILE IRR 1000ML POUR (IV SOLUTION) IMPLANT
YANKAUER SUCT BULB TIP NO VENT (SUCTIONS) ×6 IMPLANT

## 2016-06-14 NOTE — Progress Notes (Signed)
[id number] 

## 2016-06-14 NOTE — Anesthesia Procedure Notes (Signed)
Procedure Name: LMA Insertion Date/Time: 06/14/2016 12:18 PM Performed by: Suella Broad D Pre-anesthesia Checklist: Patient identified, Emergency Drugs available, Suction available and Patient being monitored Patient Re-evaluated:Patient Re-evaluated prior to inductionOxygen Delivery Method: Circle system utilized Preoxygenation: Pre-oxygenation with 100% oxygen Intubation Type: IV induction LMA: LMA inserted LMA Size: 4.0 Tube type: Oral Number of attempts: 1 Tube secured with: Tape Dental Injury: Teeth and Oropharynx as per pre-operative assessment

## 2016-06-14 NOTE — Anesthesia Procedure Notes (Signed)
Anesthesia Regional Block:  Popliteal block  Pre-Anesthetic Checklist: ,, timeout performed, Correct Patient, Correct Site, Correct Laterality, Correct Procedure, Correct Position, site marked, Risks and benefits discussed,  Surgical consent,  Pre-op evaluation,  At surgeon's request and post-op pain management  Laterality: Left  Prep: chloraprep       Needles:  Injection technique: Single-shot  Needle Type: Echogenic Needle     Needle Length: 9cm 9 cm Needle Gauge: 21 and 21 G    Additional Needles:  Procedures: ultrasound guided (picture in chart) Popliteal block Narrative:  Start time: 06/14/2016 11:15 AM End time: 06/14/2016 11:20 AM Injection made incrementally with aspirations every 5 mL.  Performed by: Personally  Anesthesiologist: Suella Broad D  Additional Notes: No immediate complications. Pt tolerated well.

## 2016-06-14 NOTE — Anesthesia Preprocedure Evaluation (Addendum)
Anesthesia Evaluation  Patient identified by MRN, date of birth, ID band Patient awake    Reviewed: Allergy & Precautions, NPO status , Patient's Chart, lab work & pertinent test results, reviewed documented beta blocker date and time   Airway Mallampati: II       Dental  (+) Teeth Intact, Dental Advisory Given   Pulmonary COPD, former smoker,    breath sounds clear to auscultation       Cardiovascular hypertension, Pt. on home beta blockers and Pt. on medications + Peripheral Vascular Disease   Rhythm:Regular Rate:Normal     Neuro/Psych  Headaches,  Neuromuscular disease negative psych ROS   GI/Hepatic Neg liver ROS, GERD  Medicated,  Endo/Other  diabetes  Renal/GU negative Renal ROS  negative genitourinary   Musculoskeletal negative musculoskeletal ROS (+)   Abdominal   Peds negative pediatric ROS (+)  Hematology negative hematology ROS (+)   Anesthesia Other Findings   Reproductive/Obstetrics negative OB ROS                            Lab Results  Component Value Date   WBC 11.6 (H) 06/12/2016   HGB 12.2 (L) 06/12/2016   HCT 37.8 (L) 06/12/2016   MCV 83.4 06/12/2016   PLT 424 (H) 06/12/2016   Lab Results  Component Value Date   CREATININE 0.58 (L) 06/12/2016   BUN 17 06/12/2016   NA 135 06/12/2016   K 4.0 06/12/2016   CL 102 06/12/2016   CO2 24 06/12/2016   Lab Results  Component Value Date   INR 1.18 06/12/2016   INR 1.17 04/06/2016   INR 1.02 04/05/2016   EKG: normal sinus rhythm.   Anesthesia Physical Anesthesia Plan  ASA: III  Anesthesia Plan: General   Post-op Pain Management: GA combined w/ Regional for post-op pain   Induction: Intravenous  Airway Management Planned: LMA  Additional Equipment:   Intra-op Plan:   Post-operative Plan: Extubation in OR  Informed Consent: I have reviewed the patients History and Physical, chart, labs and discussed  the procedure including the risks, benefits and alternatives for the proposed anesthesia with the patient or authorized representative who has indicated his/her understanding and acceptance.   Dental advisory given  Plan Discussed with: CRNA  Anesthesia Plan Comments:        Anesthesia Quick Evaluation

## 2016-06-14 NOTE — Transfer of Care (Signed)
Immediate Anesthesia Transfer of Care Note  Patient: [name]  Procedure(s) Performed: Procedure(s): IRRIGATION AND DEBRIDEMENT FOOT (Left)  Patient Location: PACU  Anesthesia Type:General  Level of Consciousness: awake, alert  and patient cooperative  Airway & Oxygen Therapy: Patient Spontanous Breathing and Patient connected to nasal cannula oxygen  Post-op Assessment: Report given to RN, Post -op Vital signs reviewed and stable, Patient moving all extremities X 4 and Patient able to stick tongue midline  Post vital signs: Reviewed and stable  Last Vitals:  Vitals:   06/14/16 0439 06/14/16 0800  BP: 104/62 99/68  Pulse: 71 68  Resp: 18   Temp: 36.3 C     Last Pain:  Vitals:   06/14/16 0810  TempSrc:   PainSc: 6          Complications: No apparent anesthesia complications

## 2016-06-14 NOTE — Brief Op Note (Signed)
06/12/2016 - 06/14/2016  11:16 AM  PATIENT:  [name]  55 y.o. male  PRE-OPERATIVE DIAGNOSIS:   1. INFECTION LEFT FOOT 2. S/P Left ankle dislocation. 3. S/P Left talus fracture. 4. S/P Left subtalar dislocation. 5. S/P Left calcaneus fracture. 6. S/P Left navicular fracture dislocation.  POST-OPERATIVE DIAGNOSIS:   1. INFECTION LEFT FOOT 2. S/P Left ankle dislocation. 3. S/P Left talus fracture. 4. S/P Left subtalar dislocation. 5. S/P Left calcaneus fracture. 6. S/P Left navicular fracture dislocation.  PROCEDURE:  Procedure(s): 1. IRRIGATION AND DEBRIDEMENT FOOT (Left) 2. REMOVAL OF CEMENT SPACER 3. PLACEMENT OF ANTIBIOTIC BEADS 4. PLACEMENT OF SMALL WOUND VAC 5. CURETTAGE OF CALCANEUS ULCERATION  SURGEON:  Surgeon(s) and Role:    * Altamese [place], MD - Primary  PHYSICIAN ASSISTANT: [name], PAC  ANESTHESIA:   general  EBL:  Total I/O In: -  Out: 275 [Urine:275]  BLOOD ADMINISTERED:none  DRAINS: WOUND VAC   LOCAL MEDICATIONS USED:  NONE  SPECIMEN:  Source of Specimen:  deep tissue and abscess  DISPOSITION OF SPECIMEN:  micro  COUNTS:  YES  TOURNIQUET:  * No tourniquets in log *  DICTATION: .Other Dictation: Dictation Number [id number]  PLAN OF CARE: Admit to inpatient   PATIENT DISPOSITION:  PACU - hemodynamically stable.   Delay start of Pharmacological VTE agent (>24hrs) due to surgical blood loss or risk of bleeding: no

## 2016-06-15 DIAGNOSIS — M86172 Other acute osteomyelitis, left ankle and foot: Principal | ICD-10-CM

## 2016-06-15 NOTE — Progress Notes (Signed)
SPORTS MEDICINE AND JOINT REPLACEMENT  Lara Mulch, MD    Carlyon Shadow, PA-C [place], [place], [place]  [address]                             [phone]   PROGRESS NOTE  Subjective:  negative for Chest Pain  negative for Shortness of Breath  negative for Nausea/Vomiting   negative for Calf Pain  negative for Bowel Movement   Tolerating Diet: yes         Patient reports pain as 4 on 0-10 scale.    Objective: Vital signs in last 24 hours:   Patient Vitals for the past 24 hrs:  BP Temp Temp src Pulse Resp SpO2  06/15/16 0505 (!) 94/57 98.1 F (36.7 C) - 73 19 97 %  06/14/16 2048 99/61 98.2 F (36.8 C) - 97 19 97 %  06/14/16 1425 112/60 98.2 F (36.8 C) Oral 97 18 99 %  06/14/16 1415 120/77 98 F (36.7 C) - 96 10 97 %  06/14/16 1400 108/69 - - 88 15 97 %  06/14/16 1345 115/65 - - 84 13 99 %  06/14/16 1330 108/70 - - 77 10 99 %  06/14/16 1325 - 98.2 F (36.8 C) - - - -    @flow {1959:LAST@   Intake/Output from previous day:   12/08 0701 - 12/09 0700 In: 1328.3 [I.V.:1328.3] Out: 1050 [Urine:1025]   Intake/Output this shift:   No intake/output data recorded.   Intake/Output      12/08 0701 - 12/09 0700 12/09 0701 - 12/10 0700   P.O.     I.V. (mL/kg) 1328.3 (16.5)    IV Piggyback 0    Total Intake(mL/kg) 1328.3 (16.5)    Urine (mL/kg/hr) 1025 (0.5)    Blood 25 (0)    Total Output 1050     Net +278.3             LABORATORY DATA:  Recent Labs  06/12/16 1833  WBC 11.6*  HGB 12.2*  HCT 37.8*  PLT 424*    Recent Labs  06/12/16 1833  NA 135  K 4.0  CL 102  CO2 24  BUN 17  CREATININE 0.58*  GLUCOSE 90  CALCIUM 9.7   Lab Results  Component Value Date   INR 1.18 06/12/2016   INR 1.17 04/06/2016   INR 1.02 04/05/2016    Examination:  General appearance: alert, cooperative and no distress  Extremities: left lower leg wound vac placement, no drainage. Right leg n/v intact  Wound Exam: clean, dry, intact   Drainage:  None:  wound tissue dry  Motor Exam: Quadriceps and Hamstrings Intact  Sensory Exam: Superficial Peroneal, Deep Peroneal and Tibial normal   Assessment:    1 Day Post-Op  Procedure(s) (LRB): IRRIGATION AND DEBRIDEMENT FOOT (Left)  ADDITIONAL DIAGNOSIS:  Active Problems:   Osteomyelitis (HCC)     Plan: Physical Therapy as ordered Non Weight Bearing (NWB) left leg  DVT Prophylaxis:  SCD  Continue to follow through the weekend, plan is to return to the OR on Monday for I/D        Donia Ast 06/15/2016, 9:05 AM
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
## 2016-06-16 LAB — CBC WITH DIFFERENTIAL/PLATELET
Basophils Absolute: 0.1 10*3/uL (ref 0.0–0.1)
Basophils Relative: 1 %
Eosinophils Absolute: 0.2 10*3/uL (ref 0.0–0.7)
Eosinophils Relative: 2 %
HCT: 32.9 % — ABNORMAL LOW (ref 39.0–52.0)
Hemoglobin: 10.2 g/dL — ABNORMAL LOW (ref 13.0–17.0)
Lymphocytes Relative: 30 %
Lymphs Abs: 2.2 10*3/uL (ref 0.7–4.0)
MCH: 26.6 pg (ref 26.0–34.0)
MCHC: 31 g/dL (ref 30.0–36.0)
MCV: 85.7 fL (ref 78.0–100.0)
Monocytes Absolute: 0.6 10*3/uL (ref 0.1–1.0)
Monocytes Relative: 8 %
Neutro Abs: 4.4 10*3/uL (ref 1.7–7.7)
Neutrophils Relative %: 59 %
Platelets: 320 10*3/uL (ref 150–400)
RBC: 3.84 MIL/uL — ABNORMAL LOW (ref 4.22–5.81)
RDW: 15.5 % (ref 11.5–15.5)
WBC: 7.4 10*3/uL (ref 4.0–10.5)

## 2016-06-16 LAB — BASIC METABOLIC PANEL
Anion gap: 9 (ref 5–15)
BUN: 10 mg/dL (ref 6–20)
CO2: 26 mmol/L (ref 22–32)
Calcium: 9.2 mg/dL (ref 8.9–10.3)
Chloride: 102 mmol/L (ref 101–111)
Creatinine, Ser: 0.71 mg/dL (ref 0.61–1.24)
GFR calc Af Amer: >60 mL/min (ref >60)
GFR calc non Af Amer: >60 mL/min (ref >60)
Glucose, Bld: 101 mg/dL — ABNORMAL HIGH (ref 65–99)
Potassium: 4 mmol/L (ref 3.5–5.1)
Sodium: 137 mmol/L (ref 135–145)

## 2016-06-16 LAB — CREATININE, SERUM
Creatinine, Ser: 0.65 mg/dL (ref 0.61–1.24)
GFR calc Af Amer: >60 mL/min (ref >60)
GFR calc non Af Amer: >60 mL/min (ref >60)

## 2016-06-16 LAB — VANCOMYCIN, TROUGH: Vancomycin Tr: 15 ug/mL (ref 15–20)

## 2016-06-16 MED ORDER — VANCOMYCIN HCL 10 G IV SOLR
1250.0000 mg | Freq: Three times a day (TID) | INTRAVENOUS | Status: DC
  Administered 2016-06-16 – 2016-06-17 (×2): 1250 mg via INTRAVENOUS
  Filled 2016-06-16 (×4): qty 1250

## 2016-06-16 NOTE — Progress Notes (Signed)
SPORTS MEDICINE AND JOINT REPLACEMENT  Lara Mulch, MD    Carlyon Shadow, PA-C [place], [place], [place]  [address]                             [phone]   PROGRESS NOTE  Subjective:  negative for Chest Pain  negative for Shortness of Breath  negative for Nausea/Vomiting   negative for Calf Pain  negative for Bowel Movement   Tolerating Diet: yes         Patient reports pain as 4 on 0-10 scale.    Objective: Vital signs in last 24 hours:   Patient Vitals for the past 24 hrs:  BP Temp Temp src Pulse Resp SpO2  06/16/16 0453 104/66 98.2 F (36.8 C) - 67 19 99 %  06/15/16 2052 99/61 98.4 F (36.9 C) - 75 19 96 %  06/15/16 1458 117/75 97.9 F (36.6 C) Oral 78 18 97 %    @flow {1959:LAST@   Intake/Output from previous day:   12/09 0701 - 12/10 0700 In: 1050 [I.V.:550] Out: 125 [Drains:125]   Intake/Output this shift:   No intake/output data recorded.   Intake/Output      12/09 0701 - 12/10 0700 12/10 0701 - 12/11 0700   I.V. (mL/kg) 550 (6.8)    IV Piggyback 500    Total Intake(mL/kg) 1050 (13)    Urine (mL/kg/hr)     Drains 125 (0.1)    Blood     Total Output 125     Net +925             LABORATORY DATA:  Recent Labs  06/12/16 1833  WBC 11.6*  HGB 12.2*  HCT 37.8*  PLT 424*    Recent Labs  06/12/16 1833 06/16/16 0510  NA 135  --   K 4.0  --   CL 102  --   CO2 24  --   BUN 17  --   CREATININE 0.58* 0.65  GLUCOSE 90  --   CALCIUM 9.7  --    Lab Results  Component Value Date   INR 1.18 06/12/2016   INR 1.17 04/06/2016   INR 1.02 04/05/2016    Examination:  General appearance: alert, cooperative and no distress Extremities: extremities normal, atraumatic, no cyanosis or edema  Wound Exam: clean, dry, intact   Drainage:  None: wound tissue dry  Motor Exam: Quadriceps and Hamstrings Intact  Sensory Exam: Superficial Peroneal, Deep Peroneal and Tibial normal   Assessment:    2 Days Post-Op  Procedure(s)  (LRB): IRRIGATION AND DEBRIDEMENT FOOT (Left)  ADDITIONAL DIAGNOSIS:  Active Problems:   Osteomyelitis (HCC)   Acute osteomyelitis of left calcaneus (HCC)     Plan: Physical Therapy as ordered Non Weight Bearing (NWB)  DVT Prophylaxis:  SCD  Will continue to follow through the weekend, plan is to return to OR tomorrow for I/D.          Donia Ast 06/16/2016, 7:42 AM

## 2016-06-16 NOTE — Anesthesia Postprocedure Evaluation (Signed)
Anesthesia Post Note  Patient: [name]  Procedure(s) Performed: Procedure(s) (LRB): IRRIGATION AND DEBRIDEMENT FOOT (Left)  Patient location during evaluation: PACU Anesthesia Type: General Level of consciousness: awake and alert Pain management: pain level controlled Vital Signs Assessment: post-procedure vital signs reviewed and stable Respiratory status: spontaneous breathing, nonlabored ventilation, respiratory function stable and patient connected to nasal cannula oxygen Cardiovascular status: blood pressure returned to baseline and stable Postop Assessment: no signs of nausea or vomiting Anesthetic complications: no    Last Vitals:  Vitals:   06/16/16 0453 06/16/16 1319  BP: 104/66 110/66  Pulse: 67 73  Resp: 19 17  Temp: 36.8 C 36.9 C    Last Pain:  Vitals:   06/16/16 1824  TempSrc:   PainSc: [name]
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
## 2016-06-17 ENCOUNTER — Encounter (HOSPITAL_COMMUNITY): Payer: Self-pay | Admitting: Orthopedic Surgery

## 2016-06-17 MED ORDER — OXYCODONE HCL 5 MG PO TABS
10.0000 mg | ORAL_TABLET | ORAL | Status: DC | PRN
  Administered 2016-06-17 – 2016-06-18 (×3): 15 mg via ORAL
  Administered 2016-06-18: 10 mg via ORAL
  Filled 2016-06-17: qty 3
  Filled 2016-06-17: qty 2
  Filled 2016-06-17 (×2): qty 3

## 2016-06-17 MED ORDER — ACETAMINOPHEN 500 MG PO TABS
1000.0000 mg | ORAL_TABLET | Freq: Three times a day (TID) | ORAL | Status: DC
  Administered 2016-06-17 – 2016-06-18 (×4): 1000 mg via ORAL
  Filled 2016-06-17 (×5): qty 2

## 2016-06-17 MED ORDER — TAPENTADOL HCL 50 MG PO TABS
100.0000 mg | ORAL_TABLET | Freq: Four times a day (QID) | ORAL | Status: DC | PRN
  Administered 2016-06-17: 100 mg via ORAL
  Filled 2016-06-17 (×2): qty 2

## 2016-06-17 MED ORDER — ACETAMINOPHEN 650 MG RE SUPP: 650.0000 mg | Freq: Three times a day (TID) | RECTAL | Status: DC

## 2016-06-17 MED ORDER — CIPROFLOXACIN IN D5W 400 MG/200ML IV SOLN
400.0000 mg | Freq: Two times a day (BID) | INTRAVENOUS | Status: DC
  Administered 2016-06-17 – 2016-06-18 (×3): 400 mg via INTRAVENOUS
  Filled 2016-06-17 (×3): qty 200
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
## 2016-06-17 NOTE — Progress Notes (Signed)
OT Cancellation Note  Patient Details Name: [name] MRN: [id number] DOB: 1960/11/21   Cancelled Treatment:    Reason Eval/Treat Not Completed: Pain limiting ability to participate. Pt states that he is hurting too much all over and that he does not want therapy today. OT will check back with pt tomorow  [name] 06/17/2016, 1:14 PM

## 2016-06-17 NOTE — Progress Notes (Addendum)
06/18/2016 @ 11:15AM  CM learned by  [facility] pharmacist, Audrea Muscat, the out of pocket cost for a 30 day supply of Nucynta 100mg  q 4hrs would be $1,500.00. CM called [place] to and learned clinic [place].  CM to made  MD, PA aware. Whitman Hero RN,BSN,CM  CM received consult: Needs medication assistance for nucynta. Pt without insurance and CM unable  to assist 2/2 medication classified as a narcotic. Medication will be an out of pocket cost for pt. Match letter will not cover narcotics. CM made PA aware. Whitman Hero RN,BSN,CM

## 2016-06-17 NOTE — Op Note (Deleted)
  The note originally documented on this encounter has been moved the the encounter in which it belongs.  
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
## 2016-06-18 ENCOUNTER — Encounter (HOSPITAL_COMMUNITY): Payer: Self-pay | Admitting: Orthopedic Surgery

## 2016-06-18 DIAGNOSIS — G8929 Other chronic pain: Secondary | ICD-10-CM | POA: Diagnosis present

## 2016-06-18 HISTORY — DX: Other chronic pain: G89.29

## 2016-06-18 MED ORDER — DOXYCYCLINE HYCLATE 100 MG PO TABS: 100.0000 mg | ORAL_TABLET | Freq: Two times a day (BID) | ORAL | 0 refills | Status: DC

## 2016-06-18 MED ORDER — DOXYCYCLINE HYCLATE 100 MG PO TABS: 100.0000 mg | ORAL_TABLET | Freq: Two times a day (BID) | ORAL | Status: DC

## 2016-06-18 MED ORDER — TAPENTADOL HCL 100 MG PO TABS: 100.0000 mg | ORAL_TABLET | Freq: Four times a day (QID) | ORAL | 0 refills | Status: DC | PRN

## 2016-06-18 MED ORDER — POLYETHYLENE GLYCOL 3350 17 G PO PACK: 17.0000 g | PACK | Freq: Every day | ORAL | 0 refills | Status: DC

## 2016-06-18 MED ORDER — DOCUSATE SODIUM 100 MG PO CAPS: 100.0000 mg | ORAL_CAPSULE | Freq: Two times a day (BID) | ORAL | 0 refills | Status: DC

## 2016-06-18 MED ORDER — HYDROCODONE-ACETAMINOPHEN 10-325 MG PO TABS: 1.0000 | ORAL_TABLET | Freq: Four times a day (QID) | ORAL | 0 refills | Status: DC | PRN

## 2016-06-18 MED ORDER — OXYCODONE HCL 10 MG PO TABS: 10.0000 mg | ORAL_TABLET | Freq: Four times a day (QID) | ORAL | 0 refills | Status: DC | PRN
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
## 2016-06-18 NOTE — Progress Notes (Signed)
PT Cancellation Note  Patient Details Name: [name] MRN: [id number] DOB: 1960-08-31   Cancelled Treatment:    Reason Eval/Treat Not Completed: PT screened, no needs identified, will sign off   On arrival patient immediately stated he is not going to work with PT today. Caregiver present reports they are discharging home today (with plan for BKA--per caregiver). Caregiver and patient not aware that patient had been upgraded to [facility] on R[facility] and prefer to continue NWB until they discuss with MD. Caregiver reports patient has been able to perform car transfers with sliding board. They have no questions or concerns and politely declined any Acute PT.    Jeanie Cooks Merrit Friesen 06/18/2016, 10:19 AM Pager [phone]

## 2016-06-18 NOTE — Discharge Instructions (Signed)
[facility] Discharge Instructions   General Discharge Instructions  WEIGHT BEARING STATUS: Weigthbearing as tolerated on R leg in boot to assist transfers, Nonweightbearing left leg   RANGE OF MOTION/ACTIVITY: activity and range of motion as tolerated   Wound Care: home health RN to perform wound vac changes on Mon, Wed, Fri  Daily wound care as needed to R foot   Discharge Wound Care Instructions  Do NOT apply any ointments, solutions or lotions to pin sites or surgical wounds.  These prevent needed drainage and even though solutions like hydrogen peroxide kill bacteria, they also damage cells lining the pin sites that help fight infection.  Applying lotions or ointments can keep the wounds moist and can cause them to breakdown and open up as well. This can increase the risk for infection. When in doubt call the office.  Surgical incisions should be dressed daily.  If any drainage is noted, use one layer of adaptic, then gauze, Kerlix, and an ace wrap.  Once the incision is completely dry and without drainage, it may be left open to air out.  Showering may begin 36-48 hours later.  Cleaning gently with soap and water.  Traumatic wounds should be dressed daily as well.    One layer of adaptic, gauze, Kerlix, then ace wrap.  The adaptic can be discontinued once the draining has ceased    If you have a wet to dry dressing: wet the gauze with saline the squeeze as much saline out so the gauze is moist (not soaking wet), place moistened gauze over wound, then place a dry gauze over the moist one, followed by Kerlix wrap, then ace wrap.  PAIN MEDICATION USE AND EXPECTATIONS  You have likely been given narcotic medications to help control your pain.  After a traumatic event that results in an fracture (broken bone) with or without surgery, it is ok to use narcotic pain medications to help control one's pain.  We understand that everyone responds to pain differently and each  individual patient will be evaluated on a regular basis for the continued need for narcotic medications. Ideally, narcotic medication use should last no more than 6-8 weeks (coinciding with fracture healing).   As a patient it is your responsibility as well to monitor narcotic medication use and report the amount and frequency you use these medications when you come to your office visit.   We would also advise that if you are using narcotic medications, you should take a dose prior to therapy to maximize you participation.  IF YOU ARE ON NARCOTIC MEDICATIONS IT IS NOT PERMISSIBLE TO OPERATE A MOTOR VEHICLE (MOTORCYCLE/CAR/TRUCK/MOPED) OR HEAVY MACHINERY DO NOT MIX NARCOTICS WITH OTHER CNS (CENTRAL NERVOUS SYSTEM) DEPRESSANTS SUCH AS ALCOHOL  Diet: as you were eating previously.  Can use over the counter stool softeners and bowel preparations, such as Miralax, to help with bowel movements.  Narcotics can be constipating.  Be sure to drink plenty of fluids    STOP SMOKING OR USING NICOTINE PRODUCTS!!!!  As discussed nicotine severely impairs your body's ability to heal surgical and traumatic wounds but also impairs bone healing.  Wounds and bone heal by forming microscopic blood vessels (angiogenesis) and nicotine is a vasoconstrictor (essentially, shrinks blood vessels).  Therefore, if vasoconstriction occurs to these microscopic blood vessels they essentially disappear and are unable to deliver necessary nutrients to the healing tissue.  This is one modifiable factor that you can do to dramatically increase your chances of healing your injury.    (  This means no smoking, no nicotine gum, patches, etc)  DO NOT USE NONSTEROIDAL ANTI-INFLAMMATORY DRUGS (NSAID'S)  Using products such as Advil (ibuprofen), Aleve (naproxen), Motrin (ibuprofen) for additional pain control during fracture healing can delay and/or prevent the healing response.  If you would like to take over the counter (OTC) medication,  Tylenol (acetaminophen) is ok.  However, some narcotic medications that are given for pain control contain acetaminophen as well. Therefore, you should not exceed more than 4000 mg of tylenol in a day if you do not have liver disease.  Also note that there are may OTC medicines, such as cold medicines and allergy medicines that my contain tylenol as well.  If you have any questions about medications and/or interactions please ask your doctor/PA or your pharmacist.      ICE AND ELEVATE INJURED/OPERATIVE EXTREMITY  Using ice and elevating the injured extremity above your heart can help with swelling and pain control.  Icing in a pulsatile fashion, such as 20 minutes on and 20 minutes off, can be followed.    Do not place ice directly on skin. Make sure there is a barrier between to skin and the ice pack.    Using frozen items such as frozen peas works well as the conform nicely to the are that needs to be iced.  USE AN ACE WRAP OR TED HOSE FOR SWELLING CONTROL  In addition to icing and elevation, Ace wraps or TED hose are used to help limit and resolve swelling.  It is recommended to use Ace wraps or TED hose until you are informed to stop.    When using Ace Wraps start the wrapping distally (farthest away from the body) and wrap proximally (closer to the body)   Example: If you had surgery on your leg or thing and you do not have a splint on, start the ace wrap at the toes and work your way up to the thigh        If you had surgery on your upper extremity and do not have a splint on, start the ace wrap at your fingers and work your way up to the upper arm  IF YOU ARE IN A SPLINT OR CAST DO NOT [place]   If your splint gets wet for any reason please contact the office immediately. You may shower in your splint or cast as long as you keep it dry.  This can be done by wrapping in a cast cover or garbage back (or similar)  Do Not stick any thing down your splint or cast such as pencils,  money, or hangers to try and scratch yourself with.  If you feel itchy take benadryl as prescribed on the bottle for itching  IF YOU ARE IN A CAM BOOT (BLACK BOOT)  You may remove boot periodically. Perform daily dressing changes as noted below.  Wash the liner of the boot regularly and wear a sock when wearing the boot. It is recommended that you sleep in the boot until told otherwise  CALL THE OFFICE WITH ANY QUESTIONS OR CONCERNS: [phone]

## 2016-06-18 NOTE — Progress Notes (Signed)
SLP Cancellation Note  Patient Details Name: [name] MRN: [id number] DOB: 17-Oct-1960   Cancelled treatment:       Reason Eval/Treat Not Completed: Other (comment). SLP requested to initiate cognitive therapy, but discussed with wife who reports potential d/c today or tomorrow; pt has SLP set up already at home. Will defer acute SLP eval and recommend pt resume SLP therapy at home given that this therapist already has a therapy plan for him.    Shelbie Franken, Katherene Ponto 06/18/2016, 9:37 AM
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
## 2016-06-18 NOTE — Progress Notes (Signed)
OT Cancellation Note and Discharge  Patient Details Name: [name] MRN: [id number] DOB: [birth year]   Cancelled Treatment:    Reason Eval/Treat Not Completed: OT screened, no needs identified, will sign off. Received a text from PT that pt is politely declining acute OT/PT while here and per pt to be D/C'd today.  [name] [id number] 06/18/2016, 12:00 PM
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
## 2016-06-19 LAB — AEROBIC/ANAEROBIC CULTURE W GRAM STAIN (SURGICAL/DEEP WOUND)

## 2016-06-19 LAB — AEROBIC/ANAEROBIC CULTURE (SURGICAL/DEEP WOUND)

## 2016-06-21 ENCOUNTER — Telehealth: Payer: Self-pay

## 2016-06-21 NOTE — Telephone Encounter (Signed)
Ethel OT from [facility] called today about Mr. [name] his OT and requesting it be rescheduled till after his double amputation appointment in January.  Patient was discharged from hospital on 04-30-2016 and had a NOSHOW on his 06-12-2016 due to him being back in the hospital.  Please advise.

## 2016-06-21 NOTE — Telephone Encounter (Signed)
That's sounds reasonable to me.

## 2016-06-30 ENCOUNTER — Encounter (HOSPITAL_COMMUNITY): Payer: Self-pay | Admitting: Emergency Medicine

## 2016-06-30 ENCOUNTER — Emergency Department (HOSPITAL_COMMUNITY)
Admission: EM | Admit: 2016-06-30 | Discharge: 2016-06-30 | Disposition: A | Discharge status: Home / Self Care | Payer: Self-pay | Attending: Emergency Medicine | Admitting: Emergency Medicine

## 2016-06-30 ENCOUNTER — Emergency Department (HOSPITAL_COMMUNITY): Payer: Self-pay

## 2016-06-30 DIAGNOSIS — J449 Chronic obstructive pulmonary disease, unspecified: Secondary | ICD-10-CM | POA: Insufficient documentation

## 2016-06-30 DIAGNOSIS — E119 Type 2 diabetes mellitus without complications: Secondary | ICD-10-CM | POA: Insufficient documentation

## 2016-06-30 DIAGNOSIS — Z87891 Personal history of nicotine dependence: Secondary | ICD-10-CM | POA: Insufficient documentation

## 2016-06-30 DIAGNOSIS — L03116 Cellulitis of left lower limb: Secondary | ICD-10-CM | POA: Insufficient documentation

## 2016-06-30 DIAGNOSIS — Z7982 Long term (current) use of aspirin: Secondary | ICD-10-CM | POA: Insufficient documentation

## 2016-06-30 DIAGNOSIS — I10 Essential (primary) hypertension: Secondary | ICD-10-CM | POA: Insufficient documentation

## 2016-06-30 LAB — URINALYSIS, ROUTINE W REFLEX MICROSCOPIC
Bilirubin Urine: NEGATIVE
Glucose, UA: NEGATIVE mg/dL
Hgb urine dipstick: NEGATIVE
Ketones, ur: NEGATIVE mg/dL
Leukocytes, UA: NEGATIVE
Nitrite: NEGATIVE
Protein, ur: NEGATIVE mg/dL
Specific Gravity, Urine: 1.012 (ref 1.005–1.030)
pH: 6 (ref 5.0–8.0)

## 2016-06-30 LAB — COMPREHENSIVE METABOLIC PANEL
ALT: 17 U/L (ref 17–63)
AST: 21 U/L (ref 15–41)
Albumin: 3.4 g/dL — ABNORMAL LOW (ref 3.5–5.0)
Alkaline Phosphatase: 83 U/L (ref 38–126)
Anion gap: 11 (ref 5–15)
BUN: 12 mg/dL (ref 6–20)
CO2: 26 mmol/L (ref 22–32)
Calcium: 10.2 mg/dL (ref 8.9–10.3)
Chloride: 99 mmol/L — ABNORMAL LOW (ref 101–111)
Creatinine, Ser: 0.56 mg/dL — ABNORMAL LOW (ref 0.61–1.24)
GFR calc Af Amer: >60 mL/min (ref >60)
GFR calc non Af Amer: >60 mL/min (ref >60)
Glucose, Bld: 121 mg/dL — ABNORMAL HIGH (ref 65–99)
Potassium: 4.5 mmol/L (ref 3.5–5.1)
Sodium: 136 mmol/L (ref 135–145)
Total Bilirubin: 0.4 mg/dL (ref 0.3–1.2)
Total Protein: 7.1 g/dL (ref 6.5–8.1)

## 2016-06-30 LAB — I-STAT CG4 LACTIC ACID, ED
Lactic Acid, Venous: 1.96 mmol/L (ref 0.5–1.9)
Lactic Acid, Venous: 0.82 mmol/L (ref 0.5–1.9)

## 2016-06-30 LAB — CBC WITH DIFFERENTIAL/PLATELET
Basophils Absolute: 0.1 10*3/uL (ref 0.0–0.1)
Basophils Relative: 1 %
Eosinophils Absolute: 0.3 10*3/uL (ref 0.0–0.7)
Eosinophils Relative: 3 %
HCT: 40.8 % (ref 39.0–52.0)
Hemoglobin: 12.9 g/dL — ABNORMAL LOW (ref 13.0–17.0)
Lymphocytes Relative: 24 %
Lymphs Abs: 2.3 10*3/uL (ref 0.7–4.0)
MCH: 26.7 pg (ref 26.0–34.0)
MCHC: 31.6 g/dL (ref 30.0–36.0)
MCV: 84.5 fL (ref 78.0–100.0)
Monocytes Absolute: 0.5 10*3/uL (ref 0.1–1.0)
Monocytes Relative: 6 %
Neutro Abs: 6.5 10*3/uL (ref 1.7–7.7)
Neutrophils Relative %: 68 %
Platelets: 302 10*3/uL (ref 150–400)
RBC: 4.83 MIL/uL (ref 4.22–5.81)
RDW: 15.3 % (ref 11.5–15.5)
WBC: 9.7 10*3/uL (ref 4.0–10.5)

## 2016-06-30 MED ORDER — PIPERACILLIN-TAZOBACTAM 3.375 G IVPB 30 MIN
3.3750 g | Freq: Once | INTRAVENOUS | Status: AC
  Administered 2016-06-30: 3.375 g via INTRAVENOUS
  Filled 2016-06-30: qty 50

## 2016-06-30 MED ORDER — SODIUM CHLORIDE 0.9 % IV BOLUS (SEPSIS)
1000.0000 mL | Freq: Once | INTRAVENOUS | Status: AC
  Administered 2016-06-30: 1000 mL via INTRAVENOUS

## 2016-06-30 MED ORDER — VANCOMYCIN HCL IN DEXTROSE 1-5 GM/200ML-% IV SOLN
1000.0000 mg | Freq: Once | INTRAVENOUS | Status: AC
  Administered 2016-06-30: 1000 mg via INTRAVENOUS
  Filled 2016-06-30: qty 200

## 2016-06-30 MED ORDER — TAPENTADOL HCL 50 MG PO TABS
50.0000 mg | ORAL_TABLET | Freq: Once | ORAL | Status: AC
  Administered 2016-06-30: 50 mg via ORAL
  Filled 2016-06-30: qty 1

## 2016-06-30 MED ORDER — HYDROMORPHONE HCL 2 MG/ML IJ SOLN
1.0000 mg | Freq: Once | INTRAMUSCULAR | Status: AC
Start: 2016-06-30 — End: 2016-06-30
  Administered 2016-06-30: 1 mg via INTRAVENOUS
  Filled 2016-06-30: qty 1

## 2016-06-30 NOTE — ED Notes (Signed)
ED Provider at bedside. 
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
## 2016-06-30 NOTE — ED Notes (Signed)
Re-paged Dr. Unk Lightning to [phone]

## 2016-06-30 NOTE — ED Notes (Signed)
Patient transported to X-ray 

## 2016-06-30 NOTE — ED Notes (Addendum)
Pt denies pain currently however this writer still giving Nucyenta because this is the time that the pt takes it at home. MD made aware and agreed.

## 2016-06-30 NOTE — Discharge Instructions (Signed)
Continue doxycyline.   Change dressing in 2 days.   Continue taking your pain meds as prescribed.   Expect some discharge from the wound.   See Dr. [name] on Wednesday.   Return to ER if he has fever, worse redness or swelling, severe pain.
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
## 2016-07-05 LAB — CULTURE, BLOOD (ROUTINE X 2)
Culture: NO GROWTH
Culture: NO GROWTH
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
## 2016-07-11 ENCOUNTER — Inpatient Hospital Stay (HOSPITAL_COMMUNITY): Payer: PRIVATE HEALTH INSURANCE

## 2016-07-11 ENCOUNTER — Inpatient Hospital Stay (HOSPITAL_COMMUNITY): Payer: PRIVATE HEALTH INSURANCE | Admitting: Anesthesiology

## 2016-07-11 ENCOUNTER — Encounter (HOSPITAL_COMMUNITY): Payer: Self-pay | Admitting: General Practice

## 2016-07-11 ENCOUNTER — Encounter (HOSPITAL_COMMUNITY)
Admission: RE | Disposition: A | Discharge status: Rehabilitation Facility | Payer: Self-pay | Source: Ambulatory Visit | Attending: Orthopedic Surgery

## 2016-07-11 ENCOUNTER — Inpatient Hospital Stay (HOSPITAL_COMMUNITY)
Admission: RE | Admit: 2016-07-11 | Discharge: 2016-07-15 | DRG: 475 | Disposition: A | Discharge status: Rehabilitation Facility | Payer: PRIVATE HEALTH INSURANCE | Source: Ambulatory Visit | Attending: Orthopedic Surgery | Admitting: Orthopedic Surgery

## 2016-07-11 DIAGNOSIS — G8929 Other chronic pain: Secondary | ICD-10-CM | POA: Diagnosis present

## 2016-07-11 DIAGNOSIS — M7989 Other specified soft tissue disorders: Secondary | ICD-10-CM | POA: Diagnosis not present

## 2016-07-11 DIAGNOSIS — F17210 Nicotine dependence, cigarettes, uncomplicated: Secondary | ICD-10-CM | POA: Diagnosis present

## 2016-07-11 DIAGNOSIS — Z87820 Personal history of traumatic brain injury: Secondary | ICD-10-CM | POA: Diagnosis not present

## 2016-07-11 DIAGNOSIS — I10 Essential (primary) hypertension: Secondary | ICD-10-CM | POA: Diagnosis present

## 2016-07-11 DIAGNOSIS — J449 Chronic obstructive pulmonary disease, unspecified: Secondary | ICD-10-CM | POA: Diagnosis present

## 2016-07-11 DIAGNOSIS — T50905A Adverse effect of unspecified drugs, medicaments and biological substances, initial encounter: Secondary | ICD-10-CM | POA: Diagnosis present

## 2016-07-11 DIAGNOSIS — B9561 Methicillin susceptible Staphylococcus aureus infection as the cause of diseases classified elsewhere: Secondary | ICD-10-CM | POA: Diagnosis present

## 2016-07-11 DIAGNOSIS — D62 Acute posthemorrhagic anemia: Secondary | ICD-10-CM | POA: Diagnosis not present

## 2016-07-11 DIAGNOSIS — Z89519 Acquired absence of unspecified leg below knee: Secondary | ICD-10-CM

## 2016-07-11 DIAGNOSIS — S069X3S Unspecified intracranial injury with loss of consciousness of 1 hour to 5 hours 59 minutes, sequela: Secondary | ICD-10-CM | POA: Diagnosis not present

## 2016-07-11 DIAGNOSIS — K219 Gastro-esophageal reflux disease without esophagitis: Secondary | ICD-10-CM | POA: Diagnosis present

## 2016-07-11 DIAGNOSIS — Z6824 Body mass index (BMI) 24.0-24.9, adult: Secondary | ICD-10-CM | POA: Diagnosis not present

## 2016-07-11 DIAGNOSIS — Z7982 Long term (current) use of aspirin: Secondary | ICD-10-CM

## 2016-07-11 DIAGNOSIS — G546 Phantom limb syndrome with pain: Secondary | ICD-10-CM | POA: Diagnosis not present

## 2016-07-11 DIAGNOSIS — E440 Moderate protein-calorie malnutrition: Secondary | ICD-10-CM | POA: Diagnosis present

## 2016-07-11 DIAGNOSIS — I739 Peripheral vascular disease, unspecified: Secondary | ICD-10-CM | POA: Diagnosis present

## 2016-07-11 DIAGNOSIS — M86672 Other chronic osteomyelitis, left ankle and foot: Principal | ICD-10-CM | POA: Diagnosis present

## 2016-07-11 DIAGNOSIS — G8918 Other acute postprocedural pain: Secondary | ICD-10-CM | POA: Diagnosis not present

## 2016-07-11 DIAGNOSIS — Z8781 Personal history of (healed) traumatic fracture: Secondary | ICD-10-CM

## 2016-07-11 DIAGNOSIS — R339 Retention of urine, unspecified: Secondary | ICD-10-CM | POA: Diagnosis not present

## 2016-07-11 DIAGNOSIS — Z9889 Other specified postprocedural states: Secondary | ICD-10-CM

## 2016-07-11 DIAGNOSIS — S88112D Complete traumatic amputation at level between knee and ankle, left lower leg, subsequent encounter: Secondary | ICD-10-CM

## 2016-07-11 DIAGNOSIS — M869 Osteomyelitis, unspecified: Secondary | ICD-10-CM | POA: Diagnosis present

## 2016-07-11 DIAGNOSIS — S069X0S Unspecified intracranial injury without loss of consciousness, sequela: Secondary | ICD-10-CM | POA: Diagnosis not present

## 2016-07-11 DIAGNOSIS — S82892S Other fracture of left lower leg, sequela: Secondary | ICD-10-CM | POA: Diagnosis not present

## 2016-07-11 DIAGNOSIS — S12101S Unspecified nondisplaced fracture of second cervical vertebra, sequela: Secondary | ICD-10-CM | POA: Diagnosis not present

## 2016-07-11 DIAGNOSIS — K5903 Drug induced constipation: Secondary | ICD-10-CM | POA: Diagnosis present

## 2016-07-11 DIAGNOSIS — Z89512 Acquired absence of left leg below knee: Secondary | ICD-10-CM | POA: Diagnosis not present

## 2016-07-11 DIAGNOSIS — F068 Other specified mental disorders due to known physiological condition: Secondary | ICD-10-CM | POA: Diagnosis not present

## 2016-07-11 HISTORY — DX: Prediabetes: R73.03

## 2016-07-11 HISTORY — PX: BELOW KNEE LEG AMPUTATION: SUR23

## 2016-07-11 HISTORY — PX: AMPUTATION: SHX166

## 2016-07-11 LAB — CBC WITH DIFFERENTIAL/PLATELET
Basophils Absolute: 0.1 10*3/uL (ref 0.0–0.1)
Basophils Relative: 1 %
Eosinophils Absolute: 0.2 10*3/uL (ref 0.0–0.7)
Eosinophils Relative: 3 %
HCT: 39.7 % (ref 39.0–52.0)
Hemoglobin: 12.7 g/dL — ABNORMAL LOW (ref 13.0–17.0)
Lymphocytes Relative: 29 %
Lymphs Abs: 2.5 10*3/uL (ref 0.7–4.0)
MCH: 26.4 pg (ref 26.0–34.0)
MCHC: 32 g/dL (ref 30.0–36.0)
MCV: 82.5 fL (ref 78.0–100.0)
Monocytes Absolute: 0.6 10*3/uL (ref 0.1–1.0)
Monocytes Relative: 6 %
Neutro Abs: 5.4 10*3/uL (ref 1.7–7.7)
Neutrophils Relative %: 61 %
Platelets: 270 10*3/uL (ref 150–400)
RBC: 4.81 MIL/uL (ref 4.22–5.81)
RDW: 15.4 % (ref 11.5–15.5)
WBC: 8.7 10*3/uL (ref 4.0–10.5)

## 2016-07-11 LAB — COMPREHENSIVE METABOLIC PANEL
ALT: 15 U/L — ABNORMAL LOW (ref 17–63)
AST: 21 U/L (ref 15–41)
Albumin: 3.4 g/dL — ABNORMAL LOW (ref 3.5–5.0)
Alkaline Phosphatase: 79 U/L (ref 38–126)
Anion gap: 11 (ref 5–15)
BUN: 12 mg/dL (ref 6–20)
CO2: 25 mmol/L (ref 22–32)
Calcium: 10.4 mg/dL — ABNORMAL HIGH (ref 8.9–10.3)
Chloride: 101 mmol/L (ref 101–111)
Creatinine, Ser: 0.61 mg/dL (ref 0.61–1.24)
GFR calc Af Amer: >60 mL/min (ref >60)
GFR calc non Af Amer: >60 mL/min (ref >60)
Glucose, Bld: 122 mg/dL — ABNORMAL HIGH (ref 65–99)
Potassium: 4.1 mmol/L (ref 3.5–5.1)
Sodium: 137 mmol/L (ref 135–145)
Total Bilirubin: 0.5 mg/dL (ref 0.3–1.2)
Total Protein: 7.2 g/dL (ref 6.5–8.1)

## 2016-07-11 LAB — C-REACTIVE PROTEIN: CRP: 2.4 mg/dL — ABNORMAL HIGH (ref <1.0)

## 2016-07-11 LAB — GLUCOSE, CAPILLARY: Glucose-Capillary: 141 mg/dL — ABNORMAL HIGH (ref 65–99)

## 2016-07-11 LAB — TYPE AND SCREEN
ABO/RH(D): B POS
Antibody Screen: NEGATIVE

## 2016-07-11 LAB — PROTIME-INR
INR: 1.12
Prothrombin Time: 14.5 seconds (ref 11.4–15.2)

## 2016-07-11 LAB — APTT: aPTT: 31 seconds (ref 24–36)

## 2016-07-11 LAB — SEDIMENTATION RATE: Sed Rate: 65 mm/hr — ABNORMAL HIGH (ref 0–16)

## 2016-07-11 LAB — ABO/RH: ABO/RH(D): B POS

## 2016-07-11 SURGERY — AMPUTATION BELOW KNEE
Anesthesia: General | Laterality: Left

## 2016-07-11 MED ORDER — PROPOFOL 10 MG/ML IV BOLUS
INTRAVENOUS | Status: AC
Start: 2016-07-11 — End: 2016-07-11
  Filled 2016-07-11: qty 40

## 2016-07-11 MED ORDER — ONDANSETRON HCL 4 MG/2ML IJ SOLN: 4.0000 mg | Freq: Four times a day (QID) | INTRAMUSCULAR | Status: DC | PRN

## 2016-07-11 MED ORDER — PHENYLEPHRINE HCL 10 MG/ML IJ SOLN
INTRAMUSCULAR | Status: DC | PRN
  Administered 2016-07-11: 60 ug via INTRAVENOUS
  Administered 2016-07-11 (×4): 80 ug via INTRAVENOUS

## 2016-07-11 MED ORDER — ASPIRIN 81 MG PO TABS: 81.0000 mg | ORAL_TABLET | Freq: Every day | ORAL | Status: DC

## 2016-07-11 MED ORDER — FENTANYL CITRATE (PF) 100 MCG/2ML IJ SOLN
INTRAMUSCULAR | Status: DC | PRN
  Administered 2016-07-11: 50 ug via INTRAVENOUS

## 2016-07-11 MED ORDER — LACTATED RINGERS IV SOLN
INTRAVENOUS | Status: DC
  Administered 2016-07-11 (×2): via INTRAVENOUS

## 2016-07-11 MED ORDER — ALBUTEROL SULFATE HFA 108 (90 BASE) MCG/ACT IN AERS: 2.0000 | INHALATION_SPRAY | Freq: Four times a day (QID) | RESPIRATORY_TRACT | Status: DC | PRN

## 2016-07-11 MED ORDER — PANTOPRAZOLE SODIUM 40 MG PO TBEC: 40.0000 mg | DELAYED_RELEASE_TABLET | Freq: Every day | ORAL | Status: DC

## 2016-07-11 MED ORDER — ACETAMINOPHEN 500 MG PO TABS
1000.0000 mg | ORAL_TABLET | Freq: Once | ORAL | Status: AC
  Administered 2016-07-11: 1000 mg via ORAL
  Filled 2016-07-11: qty 2

## 2016-07-11 MED ORDER — FENTANYL CITRATE (PF) 100 MCG/2ML IJ SOLN: 25.0000 ug | INTRAMUSCULAR | Status: DC | PRN

## 2016-07-11 MED ORDER — PANTOPRAZOLE SODIUM 40 MG PO TBEC
40.0000 mg | DELAYED_RELEASE_TABLET | Freq: Every day | ORAL | Status: DC
  Administered 2016-07-12 – 2016-07-15 (×4): 40 mg via ORAL
  Filled 2016-07-11 (×4): qty 1

## 2016-07-11 MED ORDER — DOCUSATE SODIUM 100 MG PO CAPS
100.0000 mg | ORAL_CAPSULE | Freq: Two times a day (BID) | ORAL | Status: DC
Start: 2016-07-11 — End: 2016-07-11

## 2016-07-11 MED ORDER — METOCLOPRAMIDE HCL 5 MG/ML IJ SOLN: 5.0000 mg | Freq: Three times a day (TID) | INTRAMUSCULAR | Status: DC | PRN

## 2016-07-11 MED ORDER — ONDANSETRON HCL 4 MG PO TABS: 4.0000 mg | ORAL_TABLET | Freq: Four times a day (QID) | ORAL | Status: DC | PRN

## 2016-07-11 MED ORDER — BUPIVACAINE HCL (PF) 0.5 % IJ SOLN
INTRAMUSCULAR | Status: AC
  Filled 2016-07-11: qty 30

## 2016-07-11 MED ORDER — ENOXAPARIN SODIUM 40 MG/0.4ML ~~LOC~~ SOLN
40.0000 mg | SUBCUTANEOUS | Status: DC
  Administered 2016-07-12 – 2016-07-15 (×4): 40 mg via SUBCUTANEOUS
  Filled 2016-07-11 (×4): qty 0.4

## 2016-07-11 MED ORDER — TAPENTADOL HCL 50 MG PO TABS: 100.0000 mg | ORAL_TABLET | Freq: Four times a day (QID) | ORAL | Status: DC | PRN

## 2016-07-11 MED ORDER — LORATADINE 10 MG PO TABS
10.0000 mg | ORAL_TABLET | Freq: Every day | ORAL | Status: DC
  Administered 2016-07-12 – 2016-07-15 (×4): 10 mg via ORAL
  Filled 2016-07-11 (×4): qty 1

## 2016-07-11 MED ORDER — VITAMIN D 1000 UNITS PO TABS
1000.0000 [IU] | ORAL_TABLET | Freq: Every day | ORAL | Status: DC
  Administered 2016-07-12 – 2016-07-15 (×4): 1000 [IU] via ORAL
  Filled 2016-07-11 (×6): qty 1

## 2016-07-11 MED ORDER — NEOSTIGMINE METHYLSULFATE 10 MG/10ML IV SOLN
INTRAVENOUS | Status: DC | PRN
  Administered 2016-07-11: 2 mg via INTRAVENOUS

## 2016-07-11 MED ORDER — VITAMIN C 500 MG PO TABS
500.0000 mg | ORAL_TABLET | Freq: Every day | ORAL | Status: DC
  Administered 2016-07-12 – 2016-07-15 (×4): 500 mg via ORAL
  Filled 2016-07-11 (×4): qty 1

## 2016-07-11 MED ORDER — GLYCOPYRROLATE 0.2 MG/ML IJ SOLN
INTRAMUSCULAR | Status: DC | PRN
  Administered 2016-07-11: .3 mg via INTRAVENOUS

## 2016-07-11 MED ORDER — BUPIVACAINE HCL 0.5 % IJ SOLN
INTRAMUSCULAR | Status: DC | PRN
  Administered 2016-07-11: 22 mL

## 2016-07-11 MED ORDER — POTASSIUM CHLORIDE IN NACL 20-0.9 MEQ/L-% IV SOLN
INTRAVENOUS | Status: DC
  Administered 2016-07-11 – 2016-07-13 (×5): via INTRAVENOUS
  Filled 2016-07-11 (×6): qty 1000

## 2016-07-11 MED ORDER — CEFAZOLIN SODIUM-DEXTROSE 2-4 GM/100ML-% IV SOLN
2.0000 g | INTRAVENOUS | Status: AC
  Administered 2016-07-11: 2 g via INTRAVENOUS
  Filled 2016-07-11: qty 100

## 2016-07-11 MED ORDER — TAPENTADOL HCL 100 MG PO TABS: 100.0000 mg | ORAL_TABLET | Freq: Four times a day (QID) | ORAL | Status: DC | PRN

## 2016-07-11 MED ORDER — HYDROMORPHONE HCL 1 MG/ML IJ SOLN: 1.0000 mg | INTRAMUSCULAR | Status: DC | PRN

## 2016-07-11 MED ORDER — HYDROMORPHONE HCL 2 MG/ML IJ SOLN: 1.0000 mg | INTRAMUSCULAR | Status: DC | PRN

## 2016-07-11 MED ORDER — EPHEDRINE SULFATE 50 MG/ML IJ SOLN
INTRAMUSCULAR | Status: DC | PRN
  Administered 2016-07-11 (×2): 10 mg via INTRAVENOUS
  Administered 2016-07-11: 15 mg via INTRAVENOUS
  Administered 2016-07-11: 10 mg via INTRAVENOUS

## 2016-07-11 MED ORDER — ASPIRIN EC 81 MG PO TBEC
81.0000 mg | DELAYED_RELEASE_TABLET | Freq: Every day | ORAL | Status: DC
  Administered 2016-07-12 – 2016-07-15 (×4): 81 mg via ORAL
  Filled 2016-07-11 (×4): qty 1

## 2016-07-11 MED ORDER — PROPOFOL 10 MG/ML IV BOLUS
INTRAVENOUS | Status: DC | PRN
  Administered 2016-07-11: 150 mg via INTRAVENOUS

## 2016-07-11 MED ORDER — ADULT MULTIVITAMIN W/MINERALS CH
1.0000 | ORAL_TABLET | Freq: Every day | ORAL | Status: DC
  Administered 2016-07-12 – 2016-07-15 (×4): 1 via ORAL
  Filled 2016-07-11 (×4): qty 1

## 2016-07-11 MED ORDER — FENTANYL CITRATE (PF) 100 MCG/2ML IJ SOLN
INTRAMUSCULAR | Status: AC
  Administered 2016-07-11: 50 ug
  Filled 2016-07-11: qty 2

## 2016-07-11 MED ORDER — POTASSIUM CHLORIDE IN NACL 20-0.9 MEQ/L-% IV SOLN
INTRAVENOUS | Status: DC
  Filled 2016-07-11: qty 1000

## 2016-07-11 MED ORDER — ALBUTEROL SULFATE (2.5 MG/3ML) 0.083% IN NEBU
2.5000 mg | INHALATION_SOLUTION | Freq: Four times a day (QID) | RESPIRATORY_TRACT | Status: DC | PRN
Start: 2016-07-11 — End: 2016-07-15
  Administered 2016-07-12 – 2016-07-13 (×2): 2.5 mg via RESPIRATORY_TRACT
  Filled 2016-07-11 (×2): qty 3

## 2016-07-11 MED ORDER — KETOROLAC TROMETHAMINE 15 MG/ML IJ SOLN
15.0000 mg | Freq: Four times a day (QID) | INTRAMUSCULAR | Status: AC
  Administered 2016-07-11 – 2016-07-14 (×11): 15 mg via INTRAVENOUS
  Filled 2016-07-11 (×11): qty 1

## 2016-07-11 MED ORDER — MIDAZOLAM HCL 2 MG/2ML IJ SOLN
INTRAMUSCULAR | Status: AC
  Administered 2016-07-11: 2 mg
  Filled 2016-07-11: qty 2

## 2016-07-11 MED ORDER — BUPIVACAINE-EPINEPHRINE (PF) 0.5% -1:200000 IJ SOLN
INTRAMUSCULAR | Status: DC | PRN
  Administered 2016-07-11: 40 mL via PERINEURAL

## 2016-07-11 MED ORDER — MIDAZOLAM HCL 2 MG/2ML IJ SOLN
INTRAMUSCULAR | Status: AC
  Filled 2016-07-11: qty 2

## 2016-07-11 MED ORDER — METHOCARBAMOL 1000 MG/10ML IJ SOLN
500.0000 mg | Freq: Four times a day (QID) | INTRAVENOUS | Status: DC | PRN
  Filled 2016-07-11: qty 5

## 2016-07-11 MED ORDER — GABAPENTIN 300 MG PO CAPS
300.0000 mg | ORAL_CAPSULE | Freq: Three times a day (TID) | ORAL | Status: DC
  Administered 2016-07-11 – 2016-07-15 (×12): 300 mg via ORAL
  Filled 2016-07-11 (×10): qty 1
  Filled 2016-07-11: qty 3
  Filled 2016-07-11: qty 1

## 2016-07-11 MED ORDER — TAPENTADOL HCL 50 MG PO TABS
100.0000 mg | ORAL_TABLET | Freq: Four times a day (QID) | ORAL | Status: DC
  Administered 2016-07-11 – 2016-07-15 (×16): 100 mg via ORAL
  Filled 2016-07-11 (×16): qty 2

## 2016-07-11 MED ORDER — ONDANSETRON HCL 4 MG/2ML IJ SOLN
INTRAMUSCULAR | Status: DC | PRN
  Administered 2016-07-11: 4 mg via INTRAVENOUS

## 2016-07-11 MED ORDER — ROCURONIUM BROMIDE 100 MG/10ML IV SOLN
INTRAVENOUS | Status: DC | PRN
  Administered 2016-07-11: 30 mg via INTRAVENOUS

## 2016-07-11 MED ORDER — EPINEPHRINE PF 1 MG/ML IJ SOLN
INTRAMUSCULAR | Status: DC | PRN
  Administered 2016-07-11: .15 mL

## 2016-07-11 MED ORDER — EPINEPHRINE PF 1 MG/ML IJ SOLN
INTRAMUSCULAR | Status: AC
  Filled 2016-07-11: qty 1

## 2016-07-11 MED ORDER — TOPIRAMATE 25 MG PO TABS
25.0000 mg | ORAL_TABLET | Freq: Every day | ORAL | Status: DC
  Administered 2016-07-11 – 2016-07-13 (×3): 25 mg via ORAL
  Filled 2016-07-11 (×3): qty 1

## 2016-07-11 MED ORDER — METHOCARBAMOL 500 MG PO TABS
1000.0000 mg | ORAL_TABLET | Freq: Four times a day (QID) | ORAL | Status: DC | PRN
  Administered 2016-07-14: 1000 mg via ORAL
  Filled 2016-07-11: qty 2

## 2016-07-11 MED ORDER — ACETAMINOPHEN 325 MG PO TABS: 650.0000 mg | ORAL_TABLET | Freq: Four times a day (QID) | ORAL | Status: DC | PRN

## 2016-07-11 MED ORDER — LIDOCAINE HCL (CARDIAC) 20 MG/ML IV SOLN
INTRAVENOUS | Status: DC | PRN
  Administered 2016-07-11: 100 mg via INTRATRACHEAL

## 2016-07-11 MED ORDER — ENSURE ENLIVE PO LIQD
237.0000 mL | Freq: Two times a day (BID) | ORAL | Status: DC
  Administered 2016-07-11 – 2016-07-15 (×8): 237 mL via ORAL

## 2016-07-11 MED ORDER — PROMETHAZINE HCL 25 MG/ML IJ SOLN: 6.2500 mg | INTRAMUSCULAR | Status: DC | PRN

## 2016-07-11 MED ORDER — OXYCODONE HCL 5 MG PO TABS
10.0000 mg | ORAL_TABLET | ORAL | Status: DC | PRN
  Administered 2016-07-12: 10 mg via ORAL
  Administered 2016-07-13: 5 mg via ORAL
  Administered 2016-07-15: 15 mg via ORAL
  Administered 2016-07-15: 10 mg via ORAL
  Filled 2016-07-11 (×2): qty 2
  Filled 2016-07-11: qty 4
  Filled 2016-07-11: qty 2
  Filled 2016-07-11 (×2): qty 3

## 2016-07-11 MED ORDER — METOPROLOL TARTRATE 50 MG PO TABS
50.0000 mg | ORAL_TABLET | Freq: Two times a day (BID) | ORAL | Status: DC
  Administered 2016-07-12 – 2016-07-15 (×6): 50 mg via ORAL
  Filled 2016-07-11 (×8): qty 1

## 2016-07-11 MED ORDER — CHLORHEXIDINE GLUCONATE 4 % EX LIQD: 60.0000 mL | Freq: Once | CUTANEOUS | Status: DC

## 2016-07-11 MED ORDER — MAGNESIUM HYDROXIDE 400 MG/5ML PO SUSP: 30.0000 mL | Freq: Every day | ORAL | Status: DC | PRN

## 2016-07-11 MED ORDER — ACETAMINOPHEN 650 MG RE SUPP: 650.0000 mg | Freq: Four times a day (QID) | RECTAL | Status: DC | PRN

## 2016-07-11 MED ORDER — DOCUSATE SODIUM 100 MG PO CAPS
100.0000 mg | ORAL_CAPSULE | Freq: Two times a day (BID) | ORAL | Status: DC
  Administered 2016-07-11 – 2016-07-15 (×9): 100 mg via ORAL
  Filled 2016-07-11 (×9): qty 1

## 2016-07-11 MED ORDER — METOCLOPRAMIDE HCL 5 MG PO TABS: 5.0000 mg | ORAL_TABLET | Freq: Three times a day (TID) | ORAL | Status: DC | PRN

## 2016-07-11 MED ORDER — FENTANYL CITRATE (PF) 100 MCG/2ML IJ SOLN
INTRAMUSCULAR | Status: AC
  Filled 2016-07-11: qty 4

## 2016-07-11 MED ORDER — POLYETHYLENE GLYCOL 3350 17 G PO PACK
17.0000 g | PACK | Freq: Every day | ORAL | Status: DC | PRN
  Filled 2016-07-11: qty 1

## 2016-07-11 MED ORDER — PHENYLEPHRINE 40 MCG/ML (10ML) SYRINGE FOR IV PUSH (FOR BLOOD PRESSURE SUPPORT)
PREFILLED_SYRINGE | INTRAVENOUS | Status: AC
  Filled 2016-07-11: qty 20

## 2016-07-11 MED ORDER — CEFAZOLIN IN D5W 1 GM/50ML IV SOLN
1.0000 g | Freq: Four times a day (QID) | INTRAVENOUS | Status: AC
  Administered 2016-07-11 – 2016-07-12 (×3): 1 g via INTRAVENOUS
  Filled 2016-07-11 (×4): qty 50

## 2016-07-11 MED ORDER — METHYLPHENIDATE HCL 5 MG PO TABS
5.0000 mg | ORAL_TABLET | Freq: Two times a day (BID) | ORAL | Status: DC
  Administered 2016-07-12 – 2016-07-15 (×8): 5 mg via ORAL
  Filled 2016-07-11 (×7): qty 1

## 2016-07-11 MED ORDER — 0.9 % SODIUM CHLORIDE (POUR BTL) OPTIME
TOPICAL | Status: DC | PRN
  Administered 2016-07-11: 1000 mL

## 2016-07-11 MED ORDER — GABAPENTIN 300 MG PO CAPS
300.0000 mg | ORAL_CAPSULE | Freq: Once | ORAL | Status: AC
  Administered 2016-07-11: 300 mg via ORAL
  Filled 2016-07-11: qty 1

## 2016-07-11 SURGICAL SUPPLY — 74 items
BANDAGE ACE 4X5 VEL STRL LF (GAUZE/BANDAGES/DRESSINGS) ×3 IMPLANT
BANDAGE ACE 6X5 VEL STRL LF (GAUZE/BANDAGES/DRESSINGS) ×3 IMPLANT
BANDAGE ESMARK 6X9 LF (GAUZE/BANDAGES/DRESSINGS) ×1 IMPLANT
BLADE SAW GIGLI 510 (BLADE) ×2 IMPLANT
BLADE SAW GIGLI 510MM (BLADE) ×1
BLADE SAW SAG 73X25 THK (BLADE) ×2
BLADE SAW SGTL 73X25 THK (BLADE) ×1 IMPLANT
BLADE SURG 10 STRL SS (BLADE) ×3 IMPLANT
BNDG COHESIVE 4X5 TAN STRL (GAUZE/BANDAGES/DRESSINGS) ×3 IMPLANT
BNDG COHESIVE 6X5 TAN STRL LF (GAUZE/BANDAGES/DRESSINGS) ×3 IMPLANT
BNDG ESMARK 6X9 LF (GAUZE/BANDAGES/DRESSINGS) ×3
BNDG GAUZE ELAST 4 BULKY (GAUZE/BANDAGES/DRESSINGS) ×3 IMPLANT
BRUSH SCRUB DISP (MISCELLANEOUS) ×6 IMPLANT
COTTON STERILE ROLL (GAUZE/BANDAGES/DRESSINGS) ×3 IMPLANT
COVER MAYO STAND STRL (DRAPES) ×3 IMPLANT
COVER SURGICAL LIGHT HANDLE (MISCELLANEOUS) ×3 IMPLANT
CUFF TOURNIQUET SINGLE 34IN LL (TOURNIQUET CUFF) ×3 IMPLANT
CUFF TOURNIQUET SINGLE 44IN (TOURNIQUET CUFF) IMPLANT
DECANTER SPIKE VIAL GLASS SM (MISCELLANEOUS) ×3 IMPLANT
DRAIN PENROSE 1/2X12 LTX STRL (WOUND CARE) IMPLANT
DRAPE HALF SHEET 40X57 (DRAPES) ×6 IMPLANT
DRAPE PROXIMA HALF (DRAPES) ×6 IMPLANT
DRAPE U-SHAPE 47X51 STRL (DRAPES) IMPLANT
DRSG ADAPTIC 3X8 NADH LF (GAUZE/BANDAGES/DRESSINGS) ×3 IMPLANT
DRSG PAD ABDOMINAL 8X10 ST (GAUZE/BANDAGES/DRESSINGS) ×3 IMPLANT
EVACUATOR 1/8 PVC DRAIN (DRAIN) IMPLANT
GAUZE SPONGE 4X4 12PLY STRL (GAUZE/BANDAGES/DRESSINGS) ×3 IMPLANT
GLOVE BIO SURGEON STRL SZ7.5 (GLOVE) ×3 IMPLANT
GLOVE BIO SURGEON STRL SZ8 (GLOVE) ×6 IMPLANT
GLOVE BIOGEL PI IND STRL 7.5 (GLOVE) ×1 IMPLANT
GLOVE BIOGEL PI IND STRL 8 (GLOVE) ×1 IMPLANT
GLOVE BIOGEL PI INDICATOR 7.5 (GLOVE) ×2
GLOVE BIOGEL PI INDICATOR 8 (GLOVE) ×2
GOWN STRL REUS W/ TWL LRG LVL3 (GOWN DISPOSABLE) ×2 IMPLANT
GOWN STRL REUS W/ TWL XL LVL3 (GOWN DISPOSABLE) ×1 IMPLANT
GOWN STRL REUS W/TWL LRG LVL3 (GOWN DISPOSABLE) ×4
GOWN STRL REUS W/TWL XL LVL3 (GOWN DISPOSABLE) ×2
KIT ROOM TURNOVER OR (KITS) ×3 IMPLANT
MANIFOLD NEPTUNE II (INSTRUMENTS) ×3 IMPLANT
NDL SAFETY ECLIPSE 18X1.5 (NEEDLE) ×1 IMPLANT
NEEDLE 22X1 1/2 (OR ONLY) (NEEDLE) ×3 IMPLANT
NEEDLE HYPO 18GX1.5 SHARP (NEEDLE) ×2
NS IRRIG 1000ML POUR BTL (IV SOLUTION) ×3 IMPLANT
PACK GENERAL/GYN (CUSTOM PROCEDURE TRAY) ×3 IMPLANT
PAD ABD 8X10 STRL (GAUZE/BANDAGES/DRESSINGS) ×3 IMPLANT
PAD ARMBOARD 7.5X6 YLW CONV (MISCELLANEOUS) ×6 IMPLANT
PAD CAST 4YDX4 CTTN HI CHSV (CAST SUPPLIES) ×2 IMPLANT
PADDING CAST COTTON 4X4 STRL (CAST SUPPLIES) ×4
SPONGE GAUZE 4X4 12PLY STER LF (GAUZE/BANDAGES/DRESSINGS) ×3 IMPLANT
SPONGE LAP 18X18 X RAY DECT (DISPOSABLE) ×3 IMPLANT
STAPLER VISISTAT 35W (STAPLE) ×3 IMPLANT
STOCKINETTE IMPERVIOUS LG (DRAPES) ×3 IMPLANT
SUT ETHILON 2 0 FS 18 (SUTURE) ×6 IMPLANT
SUT PDS AB 2-0 CT1 27 (SUTURE) IMPLANT
SUT PROLENE 0 CT 1 30 (SUTURE) ×6 IMPLANT
SUT PROLENE 2 0 CT 1 (SUTURE) ×3 IMPLANT
SUT SILK 2 0 (SUTURE) ×2
SUT SILK 2 0 SH CR/8 (SUTURE) ×3 IMPLANT
SUT SILK 2-0 18XBRD TIE 12 (SUTURE) ×1 IMPLANT
SUT VIC AB 0 CT1 27 (SUTURE) ×4
SUT VIC AB 0 CT1 27XBRD ANBCTR (SUTURE) ×2 IMPLANT
SUT VIC AB 1 CT1 27 (SUTURE) ×4
SUT VIC AB 1 CT1 27XBRD ANBCTR (SUTURE) ×2 IMPLANT
SUT VIC AB 2-0 CT1 27 (SUTURE) ×4
SUT VIC AB 2-0 CT1 TAPERPNT 27 (SUTURE) ×2 IMPLANT
SUT VICRYL AB 2 0 TIES (SUTURE) ×3 IMPLANT
SWAB COLLECTION DEVICE MRSA (MISCELLANEOUS) IMPLANT
SYR CONTROL 10ML LL (SYRINGE) ×3 IMPLANT
SYRINGE 1CC SLIP TB (MISCELLANEOUS) ×3 IMPLANT
TOWEL OR 17X24 6PK STRL BLUE (TOWEL DISPOSABLE) ×3 IMPLANT
TOWEL OR 17X26 10 PK STRL BLUE (TOWEL DISPOSABLE) ×6 IMPLANT
TUBE ANAEROBIC SPECIMEN COL (MISCELLANEOUS) IMPLANT
TUBE CONNECTING 12'X1/4 (SUCTIONS) ×1
TUBE CONNECTING 12X1/4 (SUCTIONS) ×2 IMPLANT

## 2016-07-11 NOTE — Anesthesia Preprocedure Evaluation (Addendum)
Anesthesia Evaluation  Patient identified by MRN, date of birth, ID band Patient awake    Reviewed: Allergy & Precautions, NPO status , Patient's Chart, lab work & pertinent test results, reviewed documented beta blocker date and time   Airway Mallampati: II  TM Distance: >3 FB Neck ROM: Full    Dental  (+) Dental Advisory Given, Edentulous Upper   Pulmonary COPD,  COPD inhaler, former smoker,    Pulmonary exam normal breath sounds clear to auscultation       Cardiovascular hypertension, Pt. on medications and Pt. on home beta blockers + Peripheral Vascular Disease  Normal cardiovascular exam Rhythm:Regular Rate:Normal  HLD   Neuro/Psych  Headaches, C2, C3, C5 fractures--C-collar in place  Neuromuscular disease negative psych ROS   GI/Hepatic Neg liver ROS, GERD  Medicated and Controlled,  Endo/Other  negative endocrine ROS  Renal/GU negative Renal ROS     Musculoskeletal negative musculoskeletal ROS (+) Chronic pain Left foot osteomyelitis   Abdominal   Peds  Hematology  (+) Blood dyscrasia, anemia ,   Anesthesia Other Findings Day of surgery medications reviewed with the patient.  Reproductive/Obstetrics                            Anesthesia Physical Anesthesia Plan  ASA: III  Anesthesia Plan: General   Post-op Pain Management: GA combined w/ Regional for post-op pain   Induction: Intravenous  Airway Management Planned: LMA  Additional Equipment:   Intra-op Plan:   Post-operative Plan: Extubation in OR  Informed Consent: I have reviewed the patients History and Physical, chart, labs and discussed the procedure including the risks, benefits and alternatives for the proposed anesthesia with the patient or authorized representative who has indicated his/her understanding and acceptance.   Dental advisory given  Plan Discussed with: CRNA  Anesthesia Plan Comments:  (Risks/benefits of general anesthesia discussed with patient including risk of damage to teeth, lips, gum, and tongue, nausea/vomiting, allergic reactions to medications, and the possibility of heart attack, stroke and death.  All patient questions answered.  Patient wishes to proceed.  Popliteal/saphenous nerve block.)       Anesthesia Quick Evaluation
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
## 2016-07-11 NOTE — Anesthesia Procedure Notes (Signed)
Procedure Name: Intubation Date/Time: 07/11/2016 8:18 AM Performed by: Mariea Clonts Pre-anesthesia Checklist: Patient identified, Emergency Drugs available, Suction available and Patient being monitored Patient Re-evaluated:Patient Re-evaluated prior to inductionOxygen Delivery Method: Circle System Utilized Preoxygenation: Pre-oxygenation with 100% oxygen Intubation Type: IV induction Ventilation: Mask ventilation without difficulty Laryngoscope Size: Miller and 3 Grade View: Grade I Tube type: Oral Tube size: 7.5 mm Number of attempts: 1 Airway Equipment and Method: Stylet and Oral airway Placement Confirmation: ETT inserted through vocal cords under direct vision,  positive ETCO2 and breath sounds checked- equal and bilateral Tube secured with: Tape Dental Injury: Teeth and Oropharynx as per pre-operative assessment

## 2016-07-11 NOTE — H&P (Signed)
H& P completed with wife

## 2016-07-11 NOTE — Anesthesia Procedure Notes (Addendum)
Anesthesia Regional Block:  Popliteal block  Pre-Anesthetic Checklist: ,, timeout performed, Correct Patient, Correct Site, Correct Laterality, Correct Procedure, Correct Position, site marked, Risks and benefits discussed,  Surgical consent,  Pre-op evaluation,  At surgeon's request and post-op pain management  Laterality: Left  Prep: chloraprep       Needles:  Injection technique: Single-shot  Needle Type: Echogenic Needle     Needle Length: 9cm 9 cm Needle Gauge: 21 and 21 G    Additional Needles:  Procedures: ultrasound guided (picture in chart) Popliteal block Narrative:  Start time: 07/11/2016 7:40 AM End time: 07/11/2016 7:50 AM Injection made incrementally with aspirations every 5 mL.  Performed by: Personally  Anesthesiologist: Catalina Gravel  Additional Notes: No pain on injection. No increased resistance to injection. Injection made in 5cc increments.  Good needle visualization.  Patient tolerated procedure well.  Left combined popliteal/saphenous nerve block.

## 2016-07-11 NOTE — Progress Notes (Signed)
Orthopedic Tech Progress Note Patient Details:  [name] 12-Feb-1961 [id number]  Ortho Devices Ortho Device/Splint Location: Applied Overhead frame with [employer].  (ortho tech)   Kristopher Oppenheim 07/11/2016, 1:39 PM

## 2016-07-11 NOTE — Progress Notes (Signed)
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

## 2016-07-11 NOTE — Anesthesia Postprocedure Evaluation (Signed)
Anesthesia Post Note  Patient: [name]  Procedure(s) Performed: Procedure(s) (LRB): LEFT AMPUTATION BELOW KNEE (Left)  Patient location during evaluation: PACU Anesthesia Type: General and Regional Level of consciousness: awake and alert Pain management: pain level controlled Vital Signs Assessment: post-procedure vital signs reviewed and stable Respiratory status: spontaneous breathing, nonlabored ventilation, respiratory function stable and patient connected to nasal cannula oxygen Cardiovascular status: blood pressure returned to baseline and stable Postop Assessment: no signs of nausea or vomiting Anesthetic complications: no       Last Vitals:  Vitals:   07/11/16 1030 07/11/16 1031  BP:  99/64  Pulse:  62  Resp:  (!) 22  Temp: 36.4 C     Last Pain:  Vitals:   07/11/16 0616  TempSrc: Oral                 Catalina Gravel

## 2016-07-11 NOTE — Brief Op Note (Signed)
07/11/2016  10:49 AM  PATIENT:  [name]  56 y.o. male  PRE-OPERATIVE DIAGNOSIS:  LEFT FOOT OSTEOMYELITIS  POST-OPERATIVE DIAGNOSIS:  LEFT FOOT OSTEOMYELITIS  PROCEDURE:  Procedure(s): LEFT AMPUTATION BELOW KNEE (Left)  SURGEON:  Surgeon(s) and Role:    * Altamese [place], MD - Primary  PHYSICIAN ASSISTANT: [name], [facility]  ANESTHESIA:   general and regional  EBL:  Total I/O In: 1300 [I.V.:1300] Out: 50 [Blood:50]  BLOOD ADMINISTERED:none  DRAINS: none   LOCAL MEDICATIONS USED:  MARCAINE     SPECIMEN:  No Specimen  DISPOSITION OF SPECIMEN:  N/A  COUNTS:  YES  TOURNIQUET:   Total Tourniquet Time Documented: Thigh (Left) - 47 minutes Total: Thigh (Left) - 47 minutes   DICTATION: .Other Dictation: Dictation Number [id number]  PLAN OF CARE: Admit to inpatient   PATIENT DISPOSITION:  PACU - hemodynamically stable.   Delay start of Pharmacological VTE agent (>24hrs) due to surgical blood loss or risk of bleeding: yes

## 2016-07-11 NOTE — Interval H&P Note (Signed)
History and Physical Interval Note:  07/11/2016 6:41 AM  [name]  has presented today for surgery, with the diagnosis of LEFT FOOT OSTEOMYLITIS  The various methods of treatment have been discussed with the patient and family. After consideration of risks, benefits and other options for treatment, the patient has consented to  Procedure(s): LEFT AMPUTATION BELOW KNEE (Left) as a surgical intervention .  The patient's history has been reviewed, patient examined, no change in status, stable for surgery.  I have reviewed the patient's chart and labs.  Questions were answered to the patient's satisfaction.     No significant changes noted since last admission Pt with MSSA osteomyelitis. He has been suppressed with PO abx (doxy and cipro). cipro started last week at office after wound started draining again  Extensive discussions regarding limb salvage vs amputation  Pt wishes to proceed with L BKA   [name] W 07/11/2016 6:44 AM

## 2016-07-11 NOTE — Progress Notes (Signed)
Paged MD abt. Pt needing an order for in and out cath, order for miralax and permission to hold metoprolol

## 2016-07-11 NOTE — Progress Notes (Signed)
I discussed with the patient the risks and benefits of surgery, including the possibility of infection, nerve injury, vessel injury, wound breakdown, arthritis, symptomatic hardware, DVT/ PE, loss of motion, and need for further surgery among others.  We also specifically discussed phantom pain.  He understood these risks and wished to proceed.  Altamese [place], MD [facility] [phone] (p)

## 2016-07-11 NOTE — Transfer of Care (Signed)
Immediate Anesthesia Transfer of Care Note  Patient: [name]  Procedure(s) Performed: Procedure(s): LEFT AMPUTATION BELOW KNEE (Left)  Patient Location: PACU  Anesthesia Type:GA combined with regional for post-op pain  Level of Consciousness: awake, alert  and oriented  Airway & Oxygen Therapy: Patient Spontanous Breathing and Patient connected to nasal cannula oxygen  Post-op Assessment: Report given to RN and Post -op Vital signs reviewed and stable  Post vital signs: Reviewed and stable  Last Vitals:  Vitals:   07/11/16 0616 07/11/16 1030  BP: 122/74   Pulse: 70   Resp: 16   Temp: 36.8 C 36.4 C    Last Pain:  Vitals:   07/11/16 0616  TempSrc: Oral         Complications: No apparent anesthesia complications

## 2016-07-12 ENCOUNTER — Encounter (HOSPITAL_COMMUNITY): Payer: Self-pay | Admitting: Orthopedic Surgery

## 2016-07-12 DIAGNOSIS — Z89512 Acquired absence of left leg below knee: Secondary | ICD-10-CM

## 2016-07-12 DIAGNOSIS — M869 Osteomyelitis, unspecified: Secondary | ICD-10-CM

## 2016-07-12 LAB — CBC
HCT: 33.9 % — ABNORMAL LOW (ref 39.0–52.0)
Hemoglobin: 10.6 g/dL — ABNORMAL LOW (ref 13.0–17.0)
MCH: 26.3 pg (ref 26.0–34.0)
MCHC: 31.3 g/dL (ref 30.0–36.0)
MCV: 84.1 fL (ref 78.0–100.0)
Platelets: 216 10*3/uL (ref 150–400)
RBC: 4.03 MIL/uL — ABNORMAL LOW (ref 4.22–5.81)
RDW: 16 % — ABNORMAL HIGH (ref 11.5–15.5)
WBC: 8.6 10*3/uL (ref 4.0–10.5)

## 2016-07-12 LAB — URINALYSIS, ROUTINE W REFLEX MICROSCOPIC
Bilirubin Urine: NEGATIVE
Glucose, UA: NEGATIVE mg/dL
Hgb urine dipstick: NEGATIVE
Ketones, ur: 5 mg/dL — AB
Leukocytes, UA: NEGATIVE
Nitrite: NEGATIVE
Protein, ur: 30 mg/dL — AB
Specific Gravity, Urine: 1.032 — ABNORMAL HIGH (ref 1.005–1.030)
pH: 5 (ref 5.0–8.0)

## 2016-07-12 LAB — BASIC METABOLIC PANEL
Anion gap: 8 (ref 5–15)
BUN: 14 mg/dL (ref 6–20)
CO2: 25 mmol/L (ref 22–32)
Calcium: 9.2 mg/dL (ref 8.9–10.3)
Chloride: 105 mmol/L (ref 101–111)
Creatinine, Ser: 0.56 mg/dL — ABNORMAL LOW (ref 0.61–1.24)
GFR calc Af Amer: >60 mL/min (ref >60)
GFR calc non Af Amer: >60 mL/min (ref >60)
Glucose, Bld: 106 mg/dL — ABNORMAL HIGH (ref 65–99)
Potassium: 4.1 mmol/L (ref 3.5–5.1)
Sodium: 138 mmol/L (ref 135–145)

## 2016-07-12 MED ORDER — POLYETHYLENE GLYCOL 3350 17 G PO PACK
17.0000 g | PACK | Freq: Every day | ORAL | Status: DC
  Administered 2016-07-12 – 2016-07-15 (×4): 17 g via ORAL
  Filled 2016-07-12 (×3): qty 1
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
## 2016-07-12 NOTE — Progress Notes (Signed)
PA returned call told me to hold lopressor, order mirlax and in and out.

## 2016-07-12 NOTE — Progress Notes (Signed)
Inpatient Rehabilitation  Met with patient and spouse to discuss team's recommendation for IP Rehab.  I shared booklets and answered questions.  I have also initiated insurance authorization for early next week.  Plan for my co-worker, [name] to follow up Monday 07/15/16.    Carmelia Roller., CCC/SLP Admission Coordinator  [place]  Cell [phone]

## 2016-07-12 NOTE — Evaluation (Signed)
Physical Therapy Evaluation Patient Details Name: [name] MRN: [id number] DOB: 28-May-1961 Today's Date: 07/12/2016   History of Present Illness  Pt is a 56 y/o male admitted with LLE osteomyelitis, now s/p L BKA.  PMH significant for TBI in 03/2016 (CIR stay), HTN, and COPD  Clinical Impression  Per wife, pt has not attempted to stand since his weight bearing restrictions were lifted in the last several weeks (was NWB BLE Sept through Dec of 2017).  Pt tolerated 3 trials of sit<>stand and improved transfer and posture with practice.  Feel pt would benefit greatly from CIR stay following d/c from acute to maximize functional independence and decrease care giver burden prior to returning home.    Follow Up Recommendations CIR;Supervision/Assistance - 24 hour    Equipment Recommendations  None recommended by PT    Recommendations for Other Services Rehab consult     Precautions / Restrictions Precautions Precautions: Cervical Precaution Comments: from previous cervical spine fx Required Braces or Orthoses: Cervical Brace;Other Brace/Splint Cervical Brace: Hard collar;At all times Other Brace/Splint: cam boot on RLE for comfort with weight bearing Restrictions Weight Bearing Restrictions: Yes RLE Weight Bearing: Weight bearing as tolerated (with CAM boot for comfort) LLE Weight Bearing: Non weight bearing      Mobility  Bed Mobility Overal bed mobility: Needs Assistance Bed Mobility: Supine to Sit;Sit to Supine     Supine to sit: Min assist Sit to supine: Min assist   General bed mobility comments: Assist to bring trunk into sitting position and for controlled lower of trunk back to bed  Transfers Overall transfer level: Needs assistance Equipment used: Rolling walker (2 wheeled) Transfers: Sit to/from Stand Sit to Stand: +2 physical assistance         General transfer comment: Pt requires +2 lifting assist to stand and maintain standing, multimodal cues for  forward weight shift, upright posture, and placement of RLE and UEs.  Pt able to tolerate 3 standing trials, max time standing 40 seconds.    Ambulation/Gait                Stairs            Wheelchair Mobility    Modified Rankin (Stroke Patients Only)       Balance Overall balance assessment: Needs assistance Sitting-balance support: Single extremity supported;Feet supported Sitting balance-Leahy Scale: Fair Sitting balance - Comments: able to demo forward reach and attempt donning R sock, however unable to complete ADL task 2/2 back pain   Standing balance support: Bilateral upper extremity supported Standing balance-Leahy Scale: Zero                               Pertinent Vitals/Pain Pain Assessment: 0-10 Pain Score: 3  Pain Location: L leg Pain Intervention(s): Repositioned;Monitored during session    [place] expects to be discharged to:: Private residence Living Arrangements: Spouse/significant other Available Help at Discharge: Family;Available 24 hours/day Type of Home: House Home Access: Ramped entrance     Home Layout: Two level;Able to live on main level with bedroom/bathroom Home Equipment: Gilford Rile - 2 wheels;Bedside commode;Tub bench;Grab bars - tub/shower;Hand held shower head;Wheelchair - manual      Prior Function Level of Independence: Needs assistance   Gait / Transfers Assistance Needed: min assist for transfers with slide board, supervision for w/c mobility           Hand Dominance   Dominant Hand: Right  Extremity/Trunk Assessment   Upper Extremity Assessment Upper Extremity Assessment: Defer to OT evaluation    Lower Extremity Assessment Lower Extremity Assessment: RLE deficits/detail;LLE deficits/detail RLE Deficits / Details: ROM WFL.  Generalized weakness 2/2 prolonged NWB Sept>December of 2017 LLE Deficits / Details: ROM WFL, strength not formally tested 2/2 pain    Cervical /  Trunk Assessment Cervical / Trunk Assessment: Other exceptions (cervical collar)  Communication   Communication: Expressive difficulties  Cognition Arousal/Alertness: Awake/alert Behavior During Therapy: Flat affect Overall Cognitive Status: History of cognitive impairments - at baseline                 General Comments: pt with history of cognitive deficts since TBI in September of 2017    General Comments      Exercises     Assessment/Plan    PT Assessment Patient needs continued PT services  PT Problem List Decreased strength;Decreased range of motion;Decreased activity tolerance;Decreased balance;Decreased mobility;Decreased coordination;Decreased cognition;Decreased knowledge of use of DME;Decreased safety awareness;Decreased knowledge of precautions          PT Treatment Interventions DME instruction;Gait training;Stair training;Functional mobility training;Therapeutic activities;Therapeutic exercise;Balance training;Neuromuscular re-education;Cognitive remediation;Patient/family education;Wheelchair mobility training    PT Goals (Current goals can be found in the Care Plan section)  Acute Rehab PT Goals Patient Stated Goal: none stated PT Goal Formulation: With patient/family Time For Goal Achievement: 07/19/16 Potential to Achieve Goals: Good    Frequency Min 4X/week   Barriers to discharge Inaccessible home environment      Co-evaluation PT/OT/SLP Co-Evaluation/Treatment: Yes Reason for Co-Treatment: Complexity of the patient's impairments (multi-system involvement);For patient/therapist safety;To address functional/ADL transfers PT goals addressed during session: Mobility/safety with mobility;Balance;Proper use of DME OT goals addressed during session: ADL's and self-care;Proper use of Adaptive equipment and DME       End of Session Equipment Utilized During Treatment: Gait belt;Cervical collar Activity Tolerance: Patient tolerated treatment  well Patient left: in bed;with call bell/phone within reach;with bed alarm set;with family/visitor present Nurse Communication: Mobility status         Time: [id number] PT Time Calculation (min) (ACUTE ONLY): 36 min   Charges:   PT Evaluation $PT Eval Moderate Complexity: 1 Procedure     PT G Codes:        Michaell Grider E Penven-Crew 07/12/2016, 3:35 PM

## 2016-07-13 LAB — CBC
HCT: 32.4 % — ABNORMAL LOW (ref 39.0–52.0)
Hemoglobin: 10 g/dL — ABNORMAL LOW (ref 13.0–17.0)
MCH: 26.6 pg (ref 26.0–34.0)
MCHC: 30.9 g/dL (ref 30.0–36.0)
MCV: 86.2 fL (ref 78.0–100.0)
Platelets: 174 10*3/uL (ref 150–400)
RBC: 3.76 MIL/uL — ABNORMAL LOW (ref 4.22–5.81)
RDW: 16.3 % — ABNORMAL HIGH (ref 11.5–15.5)
WBC: 5.4 10*3/uL (ref 4.0–10.5)

## 2016-07-13 LAB — BASIC METABOLIC PANEL
Anion gap: 6 (ref 5–15)
BUN: 12 mg/dL (ref 6–20)
CO2: 26 mmol/L (ref 22–32)
Calcium: 8.9 mg/dL (ref 8.9–10.3)
Chloride: 108 mmol/L (ref 101–111)
Creatinine, Ser: 0.49 mg/dL — ABNORMAL LOW (ref 0.61–1.24)
GFR calc Af Amer: >60 mL/min (ref >60)
GFR calc non Af Amer: >60 mL/min (ref >60)
Glucose, Bld: 98 mg/dL (ref 65–99)
Potassium: 4.7 mmol/L (ref 3.5–5.1)
Sodium: 140 mmol/L (ref 135–145)

## 2016-07-13 NOTE — Progress Notes (Signed)
Subjective: 2 Days Post-Op Procedure(s) (LRB): LEFT AMPUTATION BELOW KNEE (Left) Patient reports pain as mild.  Patient still tolerating pain.  No more phantom sensations.  No lightheadedness/dizziness, chest pain/sob, nausea/vomiting.  Appetite has returned.    Objective: Vital signs in last 24 hours: Temp:  [98.1 F (36.7 C)-98.8 F (37.1 C)] 98.6 F (37 C) (01/06 0520) Pulse Rate:  [76-86] 81 (01/06 0520) Resp:  [15-16] 15 (01/06 0520) BP: (97-122)/(54-68) 114/58 (01/06 0520) SpO2:  [95 %-99 %] 95 % (01/06 0520)  Intake/Output from previous day: 01/05 0701 - 01/06 0700 In: 3450 [P.O.:950; I.V.:2500] Out: 1275 [Urine:1275] Intake/Output this shift: No intake/output data recorded.   Recent Labs  07/11/16 0625 07/12/16 0738 07/13/16 0543  HGB 12.7* 10.6* 10.0*    Recent Labs  07/12/16 0738 07/13/16 0543  WBC 8.6 5.4  RBC 4.03* 3.76*  HCT 33.9* 32.4*  PLT 216 174    Recent Labs  07/12/16 0738 07/13/16 0543  NA 138 140  K 4.1 4.7  CL 105 108  CO2 25 26  BUN 14 12  CREATININE 0.56* 0.49*  GLUCOSE 106* 98  CALCIUM 9.2 8.9    Recent Labs  07/11/16 0625  INR 1.12   RLE: Neurovascular intact Sensation intact distally Intact pulses distally Dorsiflexion/Plantar flexion intact Incision: dressing C/D/I No cellulitis present Compartment soft  LLE-no drainage noted to dressing   Assessment/Plan: 2 Days Post-Op Procedure(s) (LRB): LEFT AMPUTATION BELOW KNEE (Left) Advance diet Up with therapy  NWB LLE WBAT RLE with cam walker Case management working on [place] 07/13/2016, 9:02 AM

## 2016-07-14 ENCOUNTER — Other Ambulatory Visit (HOSPITAL_COMMUNITY)
Admission: RE | Admit: 2016-07-14 | Discharge: 2016-07-14 | Disposition: A | Discharge status: Home / Self Care | Payer: PRIVATE HEALTH INSURANCE | Source: Ambulatory Visit | Attending: Physician Assistant | Admitting: Physician Assistant

## 2016-07-14 LAB — BASIC METABOLIC PANEL
Anion gap: 7 (ref 5–15)
BUN: 10 mg/dL (ref 6–20)
CO2: 26 mmol/L (ref 22–32)
Calcium: 9 mg/dL (ref 8.9–10.3)
Chloride: 108 mmol/L (ref 101–111)
Creatinine, Ser: 0.54 mg/dL — ABNORMAL LOW (ref 0.61–1.24)
GFR calc Af Amer: >60 mL/min (ref >60)
GFR calc non Af Amer: >60 mL/min (ref >60)
Glucose, Bld: 99 mg/dL (ref 65–99)
Potassium: 4.3 mmol/L (ref 3.5–5.1)
Sodium: 141 mmol/L (ref 135–145)

## 2016-07-14 NOTE — Progress Notes (Signed)
Subjective: 3 Days Post-Op Procedure(s) (LRB): LEFT AMPUTATION BELOW KNEE (Left) Patient reports pain as mild.  Patient doing very well this am.  Pain well controlled with nucynta.  Good appetite.  Objective: Vital signs in last 24 hours: Temp:  [98.2 F (36.8 C)-98.5 F (36.9 C)] 98.2 F (36.8 C) (01/07 0537) Pulse Rate:  [69-79] 69 (01/07 0537) Resp:  [16] 16 (01/07 0537) BP: (105-112)/(62-70) 112/69 (01/07 0537) SpO2:  [97 %-98 %] 97 % (01/07 0537)  Intake/Output from previous day: 01/06 0701 - 01/07 0700 In: 800 [P.O.:800] Out: 2060 [Urine:2060] Intake/Output this shift: No intake/output data recorded.   Recent Labs  07/12/16 0738 07/13/16 0543  HGB 10.6* 10.0*    Recent Labs  07/12/16 0738 07/13/16 0543  WBC 8.6 5.4  RBC 4.03* 3.76*  HCT 33.9* 32.4*  PLT 216 174    Recent Labs  07/13/16 0543 07/14/16 0322  NA 140 141  K 4.7 4.3  CL 108 108  CO2 26 26  BUN 12 10  CREATININE 0.49* 0.54*  GLUCOSE 98 99  CALCIUM 8.9 9.0   No results for input(s): LABPT, INR in the last 72 hours.  Neurologically intact Neurovascular intact Sensation intact distally Intact pulses distally Dorsiflexion/Plantar flexion intact Incision: dressing C/D/I No cellulitis present Compartment soft  Assessment/Plan: 3 Days Post-Op Procedure(s) (LRB): LEFT AMPUTATION BELOW KNEE (Left) Advance diet Up with therapy  WBAT RLE-in cam walker NWB [place] Case management working on [place] 07/14/2016, 8:03 AM

## 2016-07-15 ENCOUNTER — Inpatient Hospital Stay (HOSPITAL_COMMUNITY)
Admission: RE | Admit: 2016-07-15 | Discharge: 2016-07-24 | DRG: 560 | Disposition: A | Discharge status: Home Health | Payer: PRIVATE HEALTH INSURANCE | Source: Intra-hospital | Attending: Physical Medicine & Rehabilitation | Admitting: Physical Medicine & Rehabilitation

## 2016-07-15 DIAGNOSIS — K5903 Drug induced constipation: Secondary | ICD-10-CM

## 2016-07-15 DIAGNOSIS — Z4781 Encounter for orthopedic aftercare following surgical amputation: Principal | ICD-10-CM

## 2016-07-15 DIAGNOSIS — Z8781 Personal history of (healed) traumatic fracture: Secondary | ICD-10-CM

## 2016-07-15 DIAGNOSIS — R7303 Prediabetes: Secondary | ICD-10-CM | POA: Diagnosis not present

## 2016-07-15 DIAGNOSIS — Z87891 Personal history of nicotine dependence: Secondary | ICD-10-CM

## 2016-07-15 DIAGNOSIS — Z79899 Other long term (current) drug therapy: Secondary | ICD-10-CM

## 2016-07-15 DIAGNOSIS — S82831D Other fracture of upper and lower end of right fibula, subsequent encounter for closed fracture with routine healing: Secondary | ICD-10-CM | POA: Diagnosis not present

## 2016-07-15 DIAGNOSIS — S88112D Complete traumatic amputation at level between knee and ankle, left lower leg, subsequent encounter: Secondary | ICD-10-CM

## 2016-07-15 DIAGNOSIS — G8918 Other acute postprocedural pain: Secondary | ICD-10-CM | POA: Diagnosis not present

## 2016-07-15 DIAGNOSIS — S12100A Unspecified displaced fracture of second cervical vertebra, initial encounter for closed fracture: Secondary | ICD-10-CM

## 2016-07-15 DIAGNOSIS — Z89512 Acquired absence of left leg below knee: Secondary | ICD-10-CM | POA: Diagnosis not present

## 2016-07-15 DIAGNOSIS — K5900 Constipation, unspecified: Secondary | ICD-10-CM | POA: Diagnosis not present

## 2016-07-15 DIAGNOSIS — E440 Moderate protein-calorie malnutrition: Secondary | ICD-10-CM | POA: Diagnosis present

## 2016-07-15 DIAGNOSIS — G546 Phantom limb syndrome with pain: Secondary | ICD-10-CM | POA: Diagnosis not present

## 2016-07-15 DIAGNOSIS — S069X3S Unspecified intracranial injury with loss of consciousness of 1 hour to 5 hours 59 minutes, sequela: Secondary | ICD-10-CM | POA: Diagnosis not present

## 2016-07-15 DIAGNOSIS — D62 Acute posthemorrhagic anemia: Secondary | ICD-10-CM | POA: Diagnosis not present

## 2016-07-15 DIAGNOSIS — Z89519 Acquired absence of unspecified leg below knee: Secondary | ICD-10-CM

## 2016-07-15 DIAGNOSIS — S12200D Unspecified displaced fracture of third cervical vertebra, subsequent encounter for fracture with routine healing: Secondary | ICD-10-CM | POA: Diagnosis not present

## 2016-07-15 DIAGNOSIS — Z7982 Long term (current) use of aspirin: Secondary | ICD-10-CM | POA: Diagnosis not present

## 2016-07-15 DIAGNOSIS — J449 Chronic obstructive pulmonary disease, unspecified: Secondary | ICD-10-CM

## 2016-07-15 DIAGNOSIS — I10 Essential (primary) hypertension: Secondary | ICD-10-CM | POA: Diagnosis not present

## 2016-07-15 DIAGNOSIS — M7989 Other specified soft tissue disorders: Secondary | ICD-10-CM | POA: Diagnosis not present

## 2016-07-15 DIAGNOSIS — Z9889 Other specified postprocedural states: Secondary | ICD-10-CM

## 2016-07-15 DIAGNOSIS — Z87820 Personal history of traumatic brain injury: Secondary | ICD-10-CM | POA: Diagnosis not present

## 2016-07-15 DIAGNOSIS — E559 Vitamin D deficiency, unspecified: Secondary | ICD-10-CM | POA: Diagnosis not present

## 2016-07-15 DIAGNOSIS — S069X3A Unspecified intracranial injury with loss of consciousness of 1 hour to 5 hours 59 minutes, initial encounter: Secondary | ICD-10-CM | POA: Diagnosis present

## 2016-07-15 DIAGNOSIS — S12400D Unspecified displaced fracture of fifth cervical vertebra, subsequent encounter for fracture with routine healing: Secondary | ICD-10-CM

## 2016-07-15 DIAGNOSIS — E785 Hyperlipidemia, unspecified: Secondary | ICD-10-CM

## 2016-07-15 DIAGNOSIS — S12100D Unspecified displaced fracture of second cervical vertebra, subsequent encounter for fracture with routine healing: Secondary | ICD-10-CM

## 2016-07-15 DIAGNOSIS — F068 Other specified mental disorders due to known physiological condition: Secondary | ICD-10-CM | POA: Diagnosis not present

## 2016-07-15 DIAGNOSIS — K219 Gastro-esophageal reflux disease without esophagitis: Secondary | ICD-10-CM | POA: Diagnosis not present

## 2016-07-15 DIAGNOSIS — S88112A Complete traumatic amputation at level between knee and ankle, left lower leg, initial encounter: Secondary | ICD-10-CM

## 2016-07-15 DIAGNOSIS — G8929 Other chronic pain: Secondary | ICD-10-CM | POA: Diagnosis not present

## 2016-07-15 DIAGNOSIS — R000 Tachycardia, unspecified: Secondary | ICD-10-CM

## 2016-07-15 DIAGNOSIS — S069X0S Unspecified intracranial injury without loss of consciousness, sequela: Secondary | ICD-10-CM | POA: Diagnosis not present

## 2016-07-15 DIAGNOSIS — S12101S Unspecified nondisplaced fracture of second cervical vertebra, sequela: Secondary | ICD-10-CM | POA: Diagnosis not present

## 2016-07-15 LAB — CBC
HCT: 34.1 % — ABNORMAL LOW (ref 39.0–52.0)
Hemoglobin: 10.3 g/dL — ABNORMAL LOW (ref 13.0–17.0)
MCH: 26.1 pg (ref 26.0–34.0)
MCHC: 30.2 g/dL (ref 30.0–36.0)
MCV: 86.3 fL (ref 78.0–100.0)
Platelets: 188 10*3/uL (ref 150–400)
RBC: 3.95 MIL/uL — ABNORMAL LOW (ref 4.22–5.81)
RDW: 15.9 % — ABNORMAL HIGH (ref 11.5–15.5)
WBC: 7.6 10*3/uL (ref 4.0–10.5)

## 2016-07-15 LAB — VITAMIN D 25 HYDROXY (VIT D DEFICIENCY, FRACTURES): Vit D, 25-Hydroxy: 43.6 ng/mL (ref 30.0–100.0)

## 2016-07-15 LAB — CALCITRIOL (1,25 DI-OH VIT D): Vit D, 1,25-Dihydroxy: <5 pg/mL — ABNORMAL LOW (ref 19.9–79.3)

## 2016-07-15 MED ORDER — ENOXAPARIN SODIUM 40 MG/0.4ML ~~LOC~~ SOLN: 40.0000 mg | SUBCUTANEOUS | Status: DC

## 2016-07-15 MED ORDER — LORATADINE 10 MG PO TABS
10.0000 mg | ORAL_TABLET | Freq: Every day | ORAL | Status: DC
  Administered 2016-07-16 – 2016-07-24 (×9): 10 mg via ORAL
  Filled 2016-07-15 (×9): qty 1

## 2016-07-15 MED ORDER — ALBUTEROL SULFATE (2.5 MG/3ML) 0.083% IN NEBU: 2.5000 mg | INHALATION_SOLUTION | Freq: Four times a day (QID) | RESPIRATORY_TRACT | Status: DC | PRN

## 2016-07-15 MED ORDER — TOPIRAMATE 25 MG PO TABS: 25.0000 mg | ORAL_TABLET | Freq: Every day | ORAL | Status: DC

## 2016-07-15 MED ORDER — ENSURE ENLIVE PO LIQD
237.0000 mL | Freq: Two times a day (BID) | ORAL | Status: DC
  Administered 2016-07-16 – 2016-07-23 (×13): 237 mL via ORAL

## 2016-07-15 MED ORDER — PANTOPRAZOLE SODIUM 40 MG PO TBEC
40.0000 mg | DELAYED_RELEASE_TABLET | Freq: Every day | ORAL | Status: DC
  Administered 2016-07-16 – 2016-07-24 (×9): 40 mg via ORAL
  Filled 2016-07-15 (×9): qty 1

## 2016-07-15 MED ORDER — POLYETHYLENE GLYCOL 3350 17 G PO PACK
17.0000 g | PACK | Freq: Every day | ORAL | Status: DC
  Administered 2016-07-16 – 2016-07-24 (×9): 17 g via ORAL
  Filled 2016-07-15 (×9): qty 1

## 2016-07-15 MED ORDER — ASPIRIN EC 81 MG PO TBEC
81.0000 mg | DELAYED_RELEASE_TABLET | Freq: Every day | ORAL | Status: DC
  Administered 2016-07-16 – 2016-07-24 (×9): 81 mg via ORAL
  Filled 2016-07-15 (×9): qty 1

## 2016-07-15 MED ORDER — VITAMIN D 1000 UNITS PO TABS
1000.0000 [IU] | ORAL_TABLET | Freq: Every day | ORAL | Status: DC
  Administered 2016-07-16 – 2016-07-24 (×9): 1000 [IU] via ORAL
  Filled 2016-07-15 (×9): qty 1

## 2016-07-15 MED ORDER — ONDANSETRON HCL 4 MG PO TABS: 4.0000 mg | ORAL_TABLET | Freq: Four times a day (QID) | ORAL | Status: DC | PRN

## 2016-07-15 MED ORDER — ACETAMINOPHEN 325 MG PO TABS
650.0000 mg | ORAL_TABLET | Freq: Four times a day (QID) | ORAL | Status: DC | PRN
  Administered 2016-07-20 – 2016-07-23 (×2): 650 mg via ORAL
  Filled 2016-07-15 (×2): qty 2

## 2016-07-15 MED ORDER — METOPROLOL TARTRATE 50 MG PO TABS
50.0000 mg | ORAL_TABLET | Freq: Two times a day (BID) | ORAL | Status: DC
  Administered 2016-07-15 – 2016-07-24 (×18): 50 mg via ORAL
  Filled 2016-07-15 (×18): qty 1

## 2016-07-15 MED ORDER — METHOCARBAMOL 500 MG PO TABS
500.0000 mg | ORAL_TABLET | Freq: Four times a day (QID) | ORAL | Status: DC | PRN
  Administered 2016-07-15 – 2016-07-19 (×4): 500 mg via ORAL
  Filled 2016-07-15 (×5): qty 1

## 2016-07-15 MED ORDER — DOCUSATE SODIUM 100 MG PO CAPS
100.0000 mg | ORAL_CAPSULE | Freq: Two times a day (BID) | ORAL | Status: DC
  Administered 2016-07-15 – 2016-07-24 (×18): 100 mg via ORAL
  Filled 2016-07-15 (×19): qty 1

## 2016-07-15 MED ORDER — SORBITOL 70 % SOLN: 30.0000 mL | Freq: Every day | Status: DC | PRN

## 2016-07-15 MED ORDER — ADULT MULTIVITAMIN W/MINERALS CH
1.0000 | ORAL_TABLET | Freq: Every day | ORAL | Status: DC
  Administered 2016-07-16 – 2016-07-24 (×9): 1 via ORAL
  Filled 2016-07-15 (×10): qty 1

## 2016-07-15 MED ORDER — ONDANSETRON HCL 4 MG/2ML IJ SOLN
4.0000 mg | Freq: Four times a day (QID) | INTRAMUSCULAR | Status: DC | PRN
Start: 2016-07-15 — End: 2016-07-24

## 2016-07-15 MED ORDER — OXYCODONE HCL 5 MG PO TABS
10.0000 mg | ORAL_TABLET | ORAL | Status: DC | PRN
  Administered 2016-07-15: 15 mg via ORAL
  Administered 2016-07-16 – 2016-07-19 (×9): 10 mg via ORAL
  Administered 2016-07-22 (×2): 20 mg via ORAL
  Filled 2016-07-15: qty 2
  Filled 2016-07-15: qty 4
  Filled 2016-07-15 (×4): qty 2
  Filled 2016-07-15: qty 4
  Filled 2016-07-15 (×4): qty 2
  Filled 2016-07-15: qty 4
  Filled 2016-07-15: qty 2

## 2016-07-15 MED ORDER — ACETAMINOPHEN 650 MG RE SUPP: 650.0000 mg | Freq: Four times a day (QID) | RECTAL | Status: DC | PRN

## 2016-07-15 MED ORDER — TAPENTADOL HCL 50 MG PO TABS
100.0000 mg | ORAL_TABLET | Freq: Four times a day (QID) | ORAL | Status: DC
  Administered 2016-07-15 – 2016-07-24 (×35): 100 mg via ORAL
  Filled 2016-07-15 (×36): qty 2

## 2016-07-15 MED ORDER — VITAMIN C 500 MG PO TABS
500.0000 mg | ORAL_TABLET | Freq: Every day | ORAL | Status: DC
  Administered 2016-07-16 – 2016-07-24 (×9): 500 mg via ORAL
  Filled 2016-07-15 (×9): qty 1

## 2016-07-15 MED ORDER — METHYLPHENIDATE HCL 5 MG PO TABS
5.0000 mg | ORAL_TABLET | Freq: Two times a day (BID) | ORAL | Status: DC
  Administered 2016-07-16 – 2016-07-22 (×14): 5 mg via ORAL
  Filled 2016-07-15 (×15): qty 1

## 2016-07-15 MED ORDER — ENOXAPARIN SODIUM 40 MG/0.4ML ~~LOC~~ SOLN
40.0000 mg | SUBCUTANEOUS | Status: DC
  Administered 2016-07-16 – 2016-07-24 (×9): 40 mg via SUBCUTANEOUS
  Filled 2016-07-15 (×9): qty 0.4

## 2016-07-15 MED ORDER — GABAPENTIN 300 MG PO CAPS
300.0000 mg | ORAL_CAPSULE | Freq: Three times a day (TID) | ORAL | Status: DC
  Administered 2016-07-15 – 2016-07-24 (×26): 300 mg via ORAL
  Filled 2016-07-15 (×26): qty 1

## 2016-07-15 NOTE — Progress Notes (Signed)
Occupational Therapy Treatment Patient Details Name: [name] MRN: [id number] DOB: 28-Mar-1961 Today's Date: 07/15/2016    History of present illness Pt is a 56 y/o male admitted with LLE osteomyelitis, now s/p L BKA.  PMH significant for TBI in 03/2016 (CIR stay), HTN, and COPD   OT comments  Pt seated in recliner chair with wife present in room. Pt with c/o phantom leg pain in L LE. OT educated pt and caregiver on lightly tapping and rubbing area for pain management. Pt demonstrated understanding with min encouragement. Pt scooting to edge of chair with min cues for proper technique. Pt crossing R LE over to L knee and donning sock onto foot with instructional and demonstration cues this session. Pt has decreased mobility in R hip. OT also demonstrated circle sitting on EOB for caregiver as another option to increase independence and for slight stretch. Physician entered the room at this time. Pt with all needs within reach as OT exited the room.    Follow Up Recommendations  CIR;Supervision/Assistance - 24 hour    Equipment Recommendations  None recommended by OT    Recommendations for Other Services Rehab consult    Precautions / Restrictions Precautions Precautions: Cervical Precaution Comments: from previous cervical spine fx Required Braces or Orthoses: Cervical Brace Cervical Brace: Hard collar;At all times Other Brace/Splint: cam boot on RLE for comfort with weight bearing Restrictions Weight Bearing Restrictions: Yes RLE Weight Bearing: Weight bearing as tolerated LLE Weight Bearing: Non weight bearing       Mobility Bed Mobility Overal bed mobility: Needs Assistance Bed Mobility: Sidelying to Sit   Sidelying to sit: Supervision       General bed mobility comments: seated in recliner chair upon entering the room  Transfers Overall transfer level: Needs assistance Equipment used: Rolling walker (2 wheeled) Transfers: Sit to/from [employer] Sit to Stand: Mod assist   Squat pivot transfers: Mod assist     General transfer comment: squat pivot to intact side mod assist, sit-stand from recliner with RW mod assist first trial and then min assist second trial, pt with a right posterior lean requiring min to mod assist to maintain static balance. pt stood 1 min and then sitting rest break and then stood 15 seconds.    Balance Overall balance assessment: Needs assistance Sitting-balance support: No upper extremity supported;Feet supported Sitting balance-Leahy Scale: Fair     Standing balance support: Bilateral upper extremity supported Standing balance-Leahy Scale: Poor                     ADL Overall ADL's : Needs assistance/impaired                     Lower Body Dressing: Set up;Min guard (R sock) Lower Body Dressing Details (indicate cue type and reason): Pt able to don R sock this session with mod demonstrational and instructional cues for technique.                       Vision                     Perception     Praxis      Cognition   Behavior During Therapy: [facility] for tasks assessed/performed Overall Cognitive Status: History of cognitive impairments - at baseline                  General Comments: pt with history of  cognitive deficts since TBI in September of 2017    Extremity/Trunk Assessment               Exercises General Exercises - Lower Extremity Ankle Circles/Pumps: AROM;Strengthening;Right;10 reps;Supine Amputee Exercises Hip ABduction/ADduction: AROM;Strengthening;Both;10 reps;Supine Straight Leg Raises: AROM;Strengthening;Both;10 reps;Supine   Shoulder Instructions       General Comments      Pertinent Vitals/ Pain       Pain Assessment: 0-10 Pain Score: 4  Pain Location: left leg Pain Descriptors / Indicators: Discomfort Pain Intervention(s): Repositioned  Home Living                                           Prior Functioning/Environment              Frequency  Min 2X/week        Progress Toward Goals  OT Goals(current goals can now be found in the care plan section)  Progress towards OT goals: Progressing toward goals     Plan Discharge plan remains appropriate    Co-evaluation                 End of Session Equipment Utilized During Treatment: Cervical collar   Activity Tolerance Patient tolerated treatment well;No increased pain;Patient limited by pain   Patient Left with call bell/phone within reach;with family/visitor present;in chair   Nurse Communication          Time: [id number] OT Time Calculation (min): 19 min  Charges: OT General Charges $OT Visit: 1 Procedure OT Treatments $Self Care/Home Management : 8-22 mins  Gypsy Decant 07/15/2016, 11:23 AM
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
## 2016-07-15 NOTE — Progress Notes (Signed)
[facility] Follow up   Knee immobilizer on at all times to Left leg except working with therapy If pt has supervision he can be out of knee immobilizer and in bone foam to promote knee extension  Unrestricted ROM L knee with therapy  Dressing change on 07/17/2016  Jari Pigg, PA-C [facility] [phone] (P) 07/15/2016 4:01 PM

## 2016-07-15 NOTE — Progress Notes (Signed)
I await an insurance decision for possible IP rehab admission.  Pt. And wife were updated.  Please call if questions.  [place] Admissions Coordinator Cell [phone] Office [phone]

## 2016-07-15 NOTE — Progress Notes (Signed)
Patient received at approximately 1630 alert and oriented x 3-4. Left BKA with knee immobilizer intact. Oriented patient and wife to room and call bell system. Patient and wife verbalized understanding of admission process. Continue with plan of care.  Mliss Sax
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
## 2016-07-15 NOTE — Progress Notes (Signed)
Orthopedic Tech Progress Note Patient Details:  [name] 11-26-1960 [id number]  Ortho Devices Type of Ortho Device: Knee Immobilizer Ortho Device/Splint Location: Applied Overhead frame with [employer].  (ortho tech) Ortho Device/Splint Interventions: Application   [name] 07/15/2016, 4:32 PM

## 2016-07-15 NOTE — Op Note (Deleted)
  The note originally documented on this encounter has been moved the the encounter in which it belongs.  
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
## 2016-07-15 NOTE — Progress Notes (Signed)
Orthopedic Tech Progress Note Patient Details:  [name] August 07, 1960 [id number]  Ortho Devices Type of Ortho Device: Crutches Ortho Device/Splint Location: Hospital doctor frame with [employer].  (ortho tech) Ortho Device/Splint Interventions: Application   [name] 07/15/2016, 4:35 PM
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
## 2016-07-15 NOTE — Progress Notes (Signed)
Inpatient Rehabilitation  I have received insurance approval for IP rehab admission today.  I have spoken with [name], PA who clears pt. medically.  Pt. and wife agreeable.  Admission planned for later today.  I will update pt's RN of the plan and have alerted RNCM.  Please call if questions.  [place] Admissions Coordinator Cell [phone] Office [phone]

## 2016-07-16 ENCOUNTER — Inpatient Hospital Stay (HOSPITAL_COMMUNITY): Payer: Self-pay | Admitting: Occupational Therapy

## 2016-07-16 ENCOUNTER — Inpatient Hospital Stay (HOSPITAL_COMMUNITY): Payer: Self-pay | Admitting: Physical Therapy

## 2016-07-16 ENCOUNTER — Encounter (HOSPITAL_COMMUNITY): Payer: Self-pay

## 2016-07-16 ENCOUNTER — Inpatient Hospital Stay (HOSPITAL_COMMUNITY): Payer: PRIVATE HEALTH INSURANCE

## 2016-07-16 DIAGNOSIS — F068 Other specified mental disorders due to known physiological condition: Secondary | ICD-10-CM

## 2016-07-16 DIAGNOSIS — S069X0S Unspecified intracranial injury without loss of consciousness, sequela: Secondary | ICD-10-CM

## 2016-07-16 DIAGNOSIS — Z89512 Acquired absence of left leg below knee: Secondary | ICD-10-CM

## 2016-07-16 DIAGNOSIS — M7989 Other specified soft tissue disorders: Secondary | ICD-10-CM

## 2016-07-16 LAB — CBC WITH DIFFERENTIAL/PLATELET
Basophils Absolute: 0 10*3/uL (ref 0.0–0.1)
Basophils Relative: 1 %
Eosinophils Absolute: 0.4 10*3/uL (ref 0.0–0.7)
Eosinophils Relative: 4 %
HCT: 35.5 % — ABNORMAL LOW (ref 39.0–52.0)
Hemoglobin: 11.2 g/dL — ABNORMAL LOW (ref 13.0–17.0)
Lymphocytes Relative: 23 %
Lymphs Abs: 2 10*3/uL (ref 0.7–4.0)
MCH: 26.4 pg (ref 26.0–34.0)
MCHC: 31.5 g/dL (ref 30.0–36.0)
MCV: 83.7 fL (ref 78.0–100.0)
Monocytes Absolute: 0.5 10*3/uL (ref 0.1–1.0)
Monocytes Relative: 5 %
Neutro Abs: 5.9 10*3/uL (ref 1.7–7.7)
Neutrophils Relative %: 67 %
Platelets: 249 10*3/uL (ref 150–400)
RBC: 4.24 MIL/uL (ref 4.22–5.81)
RDW: 15.4 % (ref 11.5–15.5)
WBC: 8.7 10*3/uL (ref 4.0–10.5)

## 2016-07-16 LAB — COMPREHENSIVE METABOLIC PANEL
ALT: 25 U/L (ref 17–63)
AST: 19 U/L (ref 15–41)
Albumin: 3 g/dL — ABNORMAL LOW (ref 3.5–5.0)
Alkaline Phosphatase: 59 U/L (ref 38–126)
Anion gap: 8 (ref 5–15)
BUN: 15 mg/dL (ref 6–20)
CO2: 28 mmol/L (ref 22–32)
Calcium: 9.5 mg/dL (ref 8.9–10.3)
Chloride: 102 mmol/L (ref 101–111)
Creatinine, Ser: 0.58 mg/dL — ABNORMAL LOW (ref 0.61–1.24)
GFR calc Af Amer: >60 mL/min (ref >60)
GFR calc non Af Amer: >60 mL/min (ref >60)
Glucose, Bld: 114 mg/dL — ABNORMAL HIGH (ref 65–99)
Potassium: 3.9 mmol/L (ref 3.5–5.1)
Sodium: 138 mmol/L (ref 135–145)
Total Bilirubin: 0.1 mg/dL — ABNORMAL LOW (ref 0.3–1.2)
Total Protein: 6.2 g/dL — ABNORMAL LOW (ref 6.5–8.1)
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
## 2016-07-16 NOTE — Progress Notes (Signed)
Patient information reviewed and entered into eRehab system by Tarik Teixeira, RN, CRRN, PPS Coordinator.  Information including medical coding and functional independence measure will be reviewed and updated through discharge.    
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
## 2016-07-16 NOTE — Progress Notes (Signed)
VASCULAR LAB PRELIMINARY  PRELIMINARY  PRELIMINARY  PRELIMINARY  Right lower extremity venous duplex completed.    Preliminary report:  Right:  No evidence of DVT, superficial thrombosis, or Baker's cyst.  Donat Humble, RVS 07/16/2016, 4:48 PM
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
## 2016-07-17 ENCOUNTER — Inpatient Hospital Stay (HOSPITAL_COMMUNITY): Payer: Self-pay | Admitting: Occupational Therapy

## 2016-07-17 ENCOUNTER — Inpatient Hospital Stay (HOSPITAL_COMMUNITY): Payer: Self-pay | Admitting: Physical Therapy

## 2016-07-17 NOTE — Progress Notes (Signed)
Recreational Therapy Session Note  Patient Details  Name: [name] MRN: [id number] Date of Birth: 03/01/1961 Today's Date: 07/17/2016  Pain: no c/o Pt participated in animal assisted activity/therapy seated EOM with supervision.   [place] 07/17/2016, 3:20 PM

## 2016-07-17 NOTE — Progress Notes (Signed)
Occupational Therapy Session Note  Patient Details  Name: [name] MRN: [id number] Date of Birth: [birth year]  Today's Date: 07/17/2016 OT Individual Time: [id number] and 1400-1430 OT Individual Time Calculation (min): 60 min and 30 min    Short Term Goals:Week 1:  OT Short Term Goal 1 (Week 1): STG=LTG due to LOS  Skilled Therapeutic Interventions/Progress Updates:    Session One: Pt seen for OT ADL bathing/dressing session. Pt sitting up in recliner upon arrival, voicing desire to get up and move. Pt agreeable to shower this A.M. Able to recall talking about it yesterday. Completed squat pivot transfer from low recliner with mod A. In bathroom utilizing grab bars with VCs for technique and min A, pt completed squat pivot to tub bench. He bathed with supervision in sitting, completing lateral leans for buttock hygiene.  Pt returned to supine following shower for cervical brace pads to be changed. He dressed seated EOB requiring assist for LB clothing management over new residual limb. He stood with mod A from EOB to RW. He was able to pull pants up using R LE on R side, requiring assist to fully pull pants up on L as he was uncomfortable letting go of RW on amputated side. Grooming completed with set-up at sink. Min A squat pivot completed back to bed at end of session per pt request. Left in supine with knee immobilizer on, bed alarm on and all needs in reach.   Session Two: Pt seen for OT session focusing on functional transfers and donning CAM boot for increased independence with LB dressing. Pt in supine upon arrival, easily awoken and agreeable to tx session. Voiced slight discomfort in R hamstring, RN made aware and medication administered. He completed squat pivot transfer into w/c with guarding assist. He self propelled w/c to therapy gym with supervision. Completed squat pivot to therapy mat with VCs for head/hip relationship. Seated EOM, worked with pt do don/doff CAM boot. Pt used  L UE as stabilizer on EOM, fearful to let go in fear of falling. With CGA from therapist and VCs for encouragement, pt used B UE tore-don CAM boot. He transferred back to supine on mat and hamstring stretch and massage performed for comfort. Pt returned to w/c and taken back to room. Pt left seated in w/c with QRB donned and all needs in reach.   Therapy Documentation Precautions:  Precautions Precautions: Cervical Precaution Comments: from previous cervical spine fx Required Braces or Orthoses: Cervical Brace Cervical Brace: Hard collar, At all times Other Brace/Splint: cam boot on RLE for comfort with weight bearing Restrictions Weight Bearing Restrictions: Yes RLE Weight Bearing: Weight bearing as tolerated LLE Weight Bearing: Non weight bearing  See Function Navigator for Current Functional Status.   Therapy/Group: Individual Therapy  Lewis, Laisha Rau C 07/17/2016, 7:06 AM

## 2016-07-17 NOTE — Progress Notes (Signed)
[facility]     PROGRESS NOTE    Subjective/Complaints: Had some stump pain/spasms last night---muscle relaxant helped. Able to sleep. Good appetite. Sitting EOB  ROS: pt denies nausea, vomiting, diarrhea, cough, shortness of breath or chest pain    Objective: Vital Signs: Blood pressure 112/74, pulse 72, temperature 98.3 F (36.8 C), temperature source Oral, resp. rate 17, height 6\' 1"  (1.854 m), weight 84.4 kg (186 lb 1.1 oz), SpO2 95 %. No results found.  Recent Labs  07/16/16 0809  WBC 8.7  HGB 11.2*  HCT 35.5*  PLT 249    Recent Labs  07/16/16 0809  NA 138  K 3.9  CL 102  GLUCOSE 114*  BUN 15  CREATININE 0.58*  CALCIUM 9.5   CBG (last 3)  No results for input(s): GLUCAP in the last 72 hours.  Wt Readings from Last 3 Encounters:  07/17/16 84.4 kg (186 lb 1.1 oz)  07/11/16 83.9 kg (185 lb)  06/12/16 80.5 kg (177 lb 6.4 oz)    Physical Exam:  Constitutional: He is oriented to person, place, and time. NAD. Vital signs reviewed. HENT:  Head: Normocephalic. Atraumatic. Eyes: EOMare normal. No discharge.  Cardiovascular: RRR Respiratory: clear.  GI: Soft. Bowel sounds are normal. He exhibits no distension Musc: +Edema and tenderness at stump.well shaped Neurological: He is oriented to person, place, and time.  Engages quickly. Fair insight.  Motor: 4+-5/5 B/l UE, RLE, LLE HF. Senses pain right foot Skin. Staples intact along stump. Mild serosang drainage. Wound well approximated.   Assessment/Plan: 1. Functional and mobility deficits secondary to left BKA which require 3+ hours per day of interdisciplinary therapy in a comprehensive inpatient rehab setting. Physiatrist is providing close team supervision and 24 hour management of active medical problems listed below. Physiatrist and rehab team continue to assess barriers to discharge/monitor patient progress toward functional and medical  goals.  Function:  Bathing Bathing position   Position: Sitting EOB  Bathing parts Body parts bathed by patient: Right arm, Left arm, Chest, Abdomen, Front perineal area, Right upper leg, Left upper leg, Right lower leg Body parts bathed by helper: Buttocks, Back  Bathing assist        Upper Body Dressing/Undressing Upper body dressing   What is the patient wearing?: Pull over shirt/dress     Pull over shirt/dress - Perfomed by patient: Thread/unthread right sleeve, Thread/unthread left sleeve, Put head through opening, Pull shirt over trunk          Upper body assist Assist Level: Set up, Touching or steadying assistance(Pt > 75%)   Set up : To obtain clothing/put away  Lower Body Dressing/Undressing Lower body dressing   What is the patient wearing?: Underwear, Pants, AFO (R CAM boot) Underwear - Performed by patient: Thread/unthread left underwear leg, Pull underwear up/down Underwear - Performed by helper: Thread/unthread right underwear leg Pants- Performed by patient: Thread/unthread right pants leg Pants- Performed by helper: Thread/unthread left pants leg, Pull pants up/down               AFO - Performed by helper: Don/doff right AFO (R CAM boot)      Lower body assist Assist for lower body dressing: Touching or steadying assistance (Pt > 75%)      Toileting Toileting   Toileting steps completed by patient: Adjust clothing prior to toileting, Performs perineal hygiene Toileting steps completed by helper: Adjust clothing prior to toileting, Performs perineal hygiene, Adjust clothing after toileting  Toileting assist Assist level: Touching or steadying assistance (Pt.75%)   Transfers Chair/bed transfer   Chair/bed transfer method: Squat pivot Chair/bed transfer assist level: Touching or steadying assistance (Pt > 75%) Chair/bed transfer assistive device: Armrests     Locomotion Ambulation Ambulation activity did not occur: Safety/medical concerns          Wheelchair   Type: Manual Max wheelchair distance: 175 Assist Level: Supervision or verbal cues  Cognition Comprehension Comprehension assist level: Understands basic 90% of the time/cues < 10% of the time  Expression Expression assist level: Expresses basic needs/ideas: With extra time/assistive device  Social Interaction Social Interaction assist level: Interacts appropriately 90% of the time - Needs monitoring or encouragement for participation or interaction.  Problem Solving Problem solving assist level: Solves basic 75 - 89% of the time/requires cueing 10 - 24% of the time  Memory Memory assist level: Recognizes or recalls 50 - 74% of the time/requires cueing 25 - 49% of the time   Medical Problem List and Plan: 1.  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  DVT Prophylaxis/Anticoagulation: Subcutaneous Lovenox. Monitor for any bleeding episode 3. Pain Management:  Good control overall  -Nucynta 100 mg every 6 hours, Neurontin 300 mg 3 times a day, Robaxin and oxycodone as needed 4. Mood: Ritalin 5 mg twice a day 5. Neuropsych: This patient is capable of making decisions on his own behalf. 6. Skin/Wound Care: continue current dressing---move to shrinker by end of week? 7. Fluids/Electrolytes/Nutrition: Routine I&O with follow-up chemistries as available 8. History of right proximal fibula fracture with ORIF external fixator. Weightbearing as tolerated with Cam boot 9. History of C2-3-5 fractures with extradural hematoma. Cervical collar in place.   -follow-up x-rays to determine duration of cervical collar--contact Dr. Newman Pies 10. Hypertension/tachycardia. Lopressor 50 mg twice a day 11. Constipation. Laxative assistance   LOS (Days) 2 A FACE TO FACE EVALUATION WAS PERFORMED  [name], MD 07/17/2016 9:24 AM

## 2016-07-17 NOTE — Progress Notes (Signed)
Physical Therapy Session Note  Patient Details  Name: [name] MRN: [id number] Date of Birth: 10-02-1960  Today's Date: 07/17/2016 PT Individual Time: 0800-0900 and 1115-1200 PT Individual Time Calculation (min): 60 min and 45 min (total 105 min)    Short Term Goals: Week 1:  PT Short Term Goal 1 (Week 1): =LTG due to estimated LOS  Skilled Therapeutic Interventions/Progress Updates:   Tx 1: Pt received seated on EOB, c/o 3/10 pain in LLE residual limb, and agreeable to treatment. Squat pivot transfer bed <>drop arm BSC with close S. Pt doffs pants, boxers and shirt, dons clean clothing with setupA upper body, minA for threading RLE into pants and boxers d/t size of CAM boot. Transferred back to bed before pulling up pants d/t difficulty performing on BSC. Pt did not void on BSC. Sit >stand maxA from elevated bed with RW ultimately performed to pull up pants due to inability to straighten shorts while lying supine and bridging. Sit <>stand x5 reps with standing tolerance approximately 2 min per trial; sit >stand modA initially to boost to stand reduced to close S by last trial,  with close S/min guard for standing balance. Verbal cues for eccentric control when sitting. Demonstrated squat pivot transfer and performed x3 trials w/c <>bed and w/c>recliner with minA and cues for hand placement. Remained seated in recliner at end of session, quick release belt intact and all needs in reach.   Tx 2: Pt received supine in bed, c/o increased pain in BLEs but does not rate, and agreeable to treatment. Supine>sit with S. Transfer squat pivot bed>w/c and w/c <>mat table with minA for boosting hips to improve clearance over w/c wheel. Supine hamstring stretch BLE x2 min each side. Educated pt in RLE hamstring stretch using sheet, unable to perform on LLE. Long sitting hamstring stretch x2 min. Supine>prone lying with S and increased time; pillows positioned under stomach to reduce stress on low back.  Educated pt on importance of prone lying for hip flexor stretch for carryover into gait. Returned to room with w/c propulsion S x175'. Remained seated in w/c with quick release belt intact and all needs in reach.   Therapy Documentation Precautions:  Precautions Precautions: Cervical Precaution Comments: from previous cervical spine fx Required Braces or Orthoses: Cervical Brace Cervical Brace: Hard collar, At all times Other Brace/Splint: cam boot on RLE for comfort with weight bearing Restrictions Weight Bearing Restrictions: Yes RLE Weight Bearing: Weight bearing as tolerated LLE Weight Bearing: Non weight bearing Pain: Pain Assessment Pain Score: Asleep   See Function Navigator for Current Functional Status.   Therapy/Group: Individual Therapy  Luberta Mutter 07/17/2016, 8:59 AM

## 2016-07-17 NOTE — Progress Notes (Signed)
Social Work Patient ID: [name], male   DOB: May 25, 1961, 56 y.o.   MRN: [id number]  Met with pt, wife and sister-in-law yesterday following team conference.  Pt and wife aware and agreeable with targeted d/c date of 1/17 with min assist, short distance ambulation goals.  Wife confirms that they have 24/7 support for pt as they did PTA.  Discussion around follow up therapy plans but will discuss further as he progresses.  Continue to follow.  Naliah Eddington, LCSW

## 2016-07-18 ENCOUNTER — Inpatient Hospital Stay (HOSPITAL_COMMUNITY): Payer: Self-pay | Admitting: Occupational Therapy

## 2016-07-18 ENCOUNTER — Inpatient Hospital Stay (HOSPITAL_COMMUNITY): Payer: PRIVATE HEALTH INSURANCE | Admitting: Physical Therapy

## 2016-07-18 DIAGNOSIS — S069X3S Unspecified intracranial injury with loss of consciousness of 1 hour to 5 hours 59 minutes, sequela: Secondary | ICD-10-CM

## 2016-07-18 DIAGNOSIS — D62 Acute posthemorrhagic anemia: Secondary | ICD-10-CM

## 2016-07-18 NOTE — Progress Notes (Signed)
Occupational Therapy Session Note  Patient Details  Name: [name] MRN: [id number] Date of Birth: 08-Sep-1960  Today's Date: 07/18/2016 OT Individual Time: 0955-1100 and 1123- 1203 OT Individual Time Calculation (min): 65 min and 40 min    Short Term Goals:Week 1:  OT Short Term Goal 1 (Week 1): STG=LTG due to LOS  Skilled Therapeutic Interventions/Progress Updates:    Session One: Pt seen for OT ADL bathing/dressing session and session focusing on functional transfers and strengthening. Pt sitting up in w/c upon arrival, ready for tx session. He declined showering this morning, opting to bathe/ dress from w/c level at sink. Completed UB with set-up assist. He stood with steadying assist to pull pants down. More difficult to maintain balance when attempting to pull pants up and requesting OT assist for pulling pants up. From w/c level, pt was able to don/doff CAM boot with increased time. Pt very fearful  Of falling forward from w/c requiring CGA and VCs for encouragement.  He completed squat pivot transfer to [facility] over toilet able to manage w/c and drop arm commode transfer with guarding assist, lateral leans to pull down pants and complete hygiene. He demonstrated increased difficulty effectively getting  Pants up via lateral leans. He then tried to stand at [facility] though was unable to let go of RW due to fear of falling. He returned to [facility] and completed push up on arm rests while therapist pulled pants up.  Pt self propelled w/c to therapy gym with VCs for effective propulsion technique.  He transferred to mat and completed lateral push ups along edge of mat for UE strengthening and for training of squat pivot transfers. Completed with supervision and VCs for technique/ weight shift. Pt returned to room at end of session, left seated in w/c with all needs in reach, QRB donned.   Session Two: Pt seen for OT session focusing on functional standing balance and endurance. Pt sitting up in w/c upon  arrival, agreeable to change in tx session time and ready for therapy. He self propelled w/c to therapy gym for UE strengthening.  In gym, pt able to corrently set-up w/c in prep for transfer mod I. Completed squat pivot transfer to therapy mat with close supervision. Completed sit <> stand to RW from slightly elevated EOM with guarding assist. In standing, pt required to place horseshoes on overhead basketball rim, completed with guarding assist with pt using R UE. Following seated rest break, attempted to complete same task with L UE, however, pt very fearful of letting go of RW with L UE, very afraid of falling. Therefore, change of focus to pt just letting go of RW, shifting from RW to supported on table, completed with CGA. Pt self propelled w/c back to room at end of session, left sitting up in w/c with all needs in reach.  Therapy Documentation Precautions:  Precautions Precautions: Cervical Precaution Comments: from previous cervical spine fx Required Braces or Orthoses: Cervical Brace Cervical Brace: Hard collar, At all times Other Brace/Splint: cam boot on RLE for comfort with weight bearing Restrictions Weight Bearing Restrictions: Yes RLE Weight Bearing: Weight bearing as tolerated LLE Weight Bearing: Non weight bearing  See Function Navigator for Current Functional Status.   Therapy/Group: Individual Therapy  Lewis, Deyra Perdomo C 07/18/2016, 7:12 AM

## 2016-07-18 NOTE — IPOC Note (Signed)
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 ([place])   Acute blood loss anemia     Functional Problem List: Nursing Endurance, Medication Management, Motor, Pain, Perception, Safety, Sensory, Skin Integrity  PT Balance, Behavior, Endurance, Motor, Pain, Safety, Perception  OT Balance, Endurance, Safety, Pain  SLP    TR         Basic ADL's: OT Grooming, Bathing, Dressing, Toileting     Advanced  ADL's: OT       Transfers: PT Bed Mobility, Bed to Chair, Car, Manufacturing systems engineer, Metallurgist: PT Ambulation, Emergency planning/management officer     Additional Impairments: OT None  SLP        TR      Anticipated Outcomes Item Anticipated Outcome  Self Feeding Mod I  Swallowing      Basic self-care  Media planner Transfers Supervision  Bowel/Bladder  mod I   Transfers  S   Locomotion  modI w/c propulsion, minA   Communication     Cognition     Pain  less than 3  Safety/Judgment  min assist    Therapy Plan: PT Intensity: Minimum of 1-2 x/day ,45 to 90 minutes PT Frequency: 5 out of 7 days PT Duration Estimated Length of Stay: 7-10 days OT Intensity: Minimum of 1-2 x/day, 45 to 90 minutes OT Frequency: 5 out of 7 days OT Duration/Estimated Length of Stay: 7 days         Team Interventions: Nursing Interventions Patient/Family Education, Disease Management/Prevention, Pain Management, Medication Management, Skin Care/Wound Management, Cognitive Remediation/Compensation, Discharge Planning, Psychosocial Support  PT interventions Ambulation/gait training, Balance/vestibular training, Discharge planning, Disease management/prevention, Neuromuscular  re-education, Functional mobility training, Psychosocial support, Skin care/wound management, Therapeutic Exercise, Therapeutic Activities, Wheelchair propulsion/positioning, UE/LE Strength taining/ROM, UE/LE Coordination activities, Pain management, Patient/family education, Community reintegration, Cognitive remediation/compensation, Engineer, drilling  OT Interventions Training and development officer, Discharge planning, Community reintegration, Engineer, drilling, Pain management, Psychosocial support, Patient/family education, Self Care/advanced ADL retraining, Therapeutic Activities, Therapeutic Exercise, UE/LE Strength taining/ROM, UE/LE Coordination activities, Wheelchair propulsion/positioning  SLP Interventions    TR Interventions    SW/CM Interventions Discharge Planning, Barrister's clerk, Patient/Family Education    Team Discharge Planning: Destination: PT-Home ,OT- Home , SLP-  Projected Follow-up: PT-Home health PT, OT-  Home health OT, SLP-  Projected Equipment Needs: PT-To be determined, OT- None recommended by OT, SLP-  Equipment Details: PT- , OT-Pt has all needed DME from previous admission Patient/family involved in discharge planning: PT- Patient, Family member/caregiver,  OT-Patient, Family member/caregiver, SLP-   MD ELOS: 07/24/16  Medical Rehab Prognosis:  Excellent Assessment: The patient has been admitted for CIR therapies with the diagnosis of left BKA after developing osteomyelitis in the limb. The team will be addressing functional mobility, strength, stamina, balance, safety, adaptive techniques and equipment, self-care, bowel and bladder mgt, patient and caregiver education, pre-prosthetic education, wound/leg prep, pain control, ego support, community reintegration, initiated weight bearing RLE which has not born significant weight since original accident. Goals have been set at supervision to mod I with self-care and supervision to mod  I with transfers/wheelchair mobility, min assist for gait.    [name], MD,  FAAPMR      See Team Conference Notes for weekly updates to the plan of care

## 2016-07-18 NOTE — Progress Notes (Signed)
Left KI in place. Complains of "cramps", ? Spasms to left thigh. PRN robaxin given at HS and applied heat pack. Reported relief. Thick dry skin to bottom of right foot, incisions to right foot/ankle basically healed. Wife at bedside. Patrici Ranks A
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
## 2016-07-18 NOTE — Progress Notes (Signed)
Physical Therapy Session Note  Patient Details  Name: [name] MRN: [id number] Date of Birth: 1960-09-30  Today's Date: 07/18/2016 PT Individual Time: 1330-1400 and 0800-0900 PT Individual Time Calculation (min): 30 min and 60 min (total 90 min)    Short Term Goals: Week 1:  PT Short Term Goal 1 (Week 1): =LTG due to estimated LOS  Skilled Therapeutic Interventions/Progress Updates:   Tx 1: Pt received seated in w/c eating breakfast; c/o pain 3/10 in LLE residual limb following medication, and agreeable to treatment. Discussed use of shrinker with pt after pt report MD mentioned it this morning; educated pt regarding stabilizing limb size and reducing edema prior to being cast for prosthetic. W/c propulsion to gym with modI, BUEs for strengthening and endurance. Squat pivot transfer w/c >mat with S for safety, no cues needed for w/c setup. Sit <>stand from elevated mat table with parallel bars for BUE support and S. Performed hopping RLE x3 trials with poor foot clearance. Demonstrated tricep/elbow extension vs correct technique with scapular depression for energy conservation, control of RLE weight bearing, and improved foot clearance. Pt able to return demonstration with S and significantly improved foot clearance for three "steps" in place before fatigued. Progressed to walking forward/backward in parallel bars up to 10' at a time with min guard, 4 trials total including turn at end of trial to sit in chair or on mat before fatigued. Supine LE strengthening exercises including glute sets, bridging, sidelying abduction LLE x15 reps each; provided handout with pictures and written directions for each exercise. Squat pivot to return to w/c with S, min cues for increasing hip clearance over wheel. W/c propulsion to return to room with modI. Remained seated in w/c at end of session, all needs in reach and quick release belt intact.   Tx 2: Pt received seated in w/c, denies pain but does report  increased fatigue from previous sessions. W/c propulsion to/from gym [id number]' with BUE and modI, increased time. Squat pivot transfer w/c <>nustep with min guard and cueing to increase hip clearance over support surface. Performed nustep x 8 min with BUE/RLE level 5 with average 45 steps/min for strengthening and aerobic endurance. Remained seated in w/c at end of session, quick release belt intact and all needs in reach.   Therapy Documentation Precautions:  Precautions Precautions: Cervical Precaution Comments: from previous cervical spine fx Required Braces or Orthoses: Cervical Brace Cervical Brace: Hard collar, At all times Other Brace/Splint: cam boot on RLE for comfort with weight bearing Restrictions Weight Bearing Restrictions: Yes RLE Weight Bearing: Weight bearing as tolerated LLE Weight Bearing: Non weight bearing   See Function Navigator for Current Functional Status.   Therapy/Group: Individual Therapy  Luberta Mutter 07/18/2016, 1:46 PM
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
## 2016-07-18 NOTE — Care Management Note (Signed)
[facility] Individual Statement of Services  Patient Name:  [name]  Date:  07/18/2016  Welcome to the [facility].  Our goal is to provide you with an individualized program based on your diagnosis and situation, designed to meet your specific needs.  With this comprehensive rehabilitation program, you will be expected to participate in at least 3 hours of rehabilitation therapies Monday-Friday, with modified therapy programming on the weekends.  Your rehabilitation program will include the following services:  Physical Therapy (PT), Occupational Therapy (OT), 24 hour per day rehabilitation nursing, Therapeutic Recreaction (TR), Neuropsychology, Case Management (Social Worker), Rehabilitation Medicine, Nutrition Services and Pharmacy Services  Weekly team conferences will be held on Tuesdays to discuss your progress.  Your Social Worker will talk with you frequently to get your input and to update you on team discussions.  Team conferences with you and your family in attendance may also be held.  Expected length of stay: 10 days  Overall anticipated outcome: min assist  Depending on your progress and recovery, your program may change. Your Social Worker will coordinate services and will keep you informed of any changes. Your Social Worker's name and contact numbers are listed  below.  The following services may also be recommended but are not provided by the [facility] will be made to provide these services after discharge if needed.  Arrangements include referral to agencies that provide these services.  Your insurance has been verified to be:  Ship broker primary doctor is:  Research officer, political party  Pertinent information will be shared with your doctor and your insurance  company.  Social Worker:  [place], [place] or ([phone]   Information discussed with and copy given to patient by: Lennart Pall, 07/18/2016, 3:30 PM

## 2016-07-19 ENCOUNTER — Inpatient Hospital Stay (HOSPITAL_COMMUNITY): Payer: PRIVATE HEALTH INSURANCE

## 2016-07-19 ENCOUNTER — Inpatient Hospital Stay (HOSPITAL_COMMUNITY): Payer: PRIVATE HEALTH INSURANCE | Admitting: Occupational Therapy

## 2016-07-19 ENCOUNTER — Inpatient Hospital Stay (HOSPITAL_COMMUNITY): Payer: Self-pay | Admitting: Physical Therapy

## 2016-07-19 ENCOUNTER — Inpatient Hospital Stay (HOSPITAL_COMMUNITY): Payer: Self-pay | Admitting: Occupational Therapy

## 2016-07-19 NOTE — Progress Notes (Addendum)
Occupational Therapy Session Note  Patient Details  Name: [name] MRN: [id number] Date of Birth: 29-Aug-1960  Today's Date: 07/19/2016 OT Individual Time: 1000-1100 and 1345-1455 OT Individual Time Calculation (min): 60 min and 70 min    Short Term Goals:Week 1:  OT Short Term Goal 1 (Week 1): STG=LTG due to LOS  Skilled Therapeutic Interventions/Progress Updates:    Session One: Pt seen for OT ADL bathing/dressing session. Pt on toilet upon arrival, reporting nursing staff assisted him there. He attempted lateral leans to pull pants up, however, he was unable to get pants up fully. He then completed pushups on armrests and therapist assisted with pulling pants up.  He self propelled w/c to ADL apartment. Bathing completed on tub shower bench in simulation of home environment. Squat pivot completed with supervision to tub bench. He bathed with supervision, lateral leans for buttock hygiene. He dressed seated on edge of tub bench. Attempted to stand to pull pants up, pt able to leg go of RW with R UE to pull pants up, however,  He was unable to reach around to pull pants up on L and unable to let go of RW with L UE due to decreased balance/ fear of falling. Therapist assisted.  He returned to room and to supine position for cervical brace pads to be changed. Pt returned to w/c and left sitting up in chair with QRB donned awaiting next OT.   Session Two: Pt seen for OT session focusing on activities in prep for LB dressing and kitchen mobility. Pt received in supine having just returned from cervical x-ray, agreeable to tx session.  Throughout session, pt completed squat pivot transfers with close supervision. He had less clearance on transfers this afternoon compared to previous sessions due to R LE fatigue.  On therapy mat, completed reaching activities in prep for lateral leans during dressing/ toileting tasks. Worked on picking items up off floor from EOM to address functional sitting  balance and overcoming fear of falling.  Attempted standing x2 trials, however, due to increased fatigue and feeling weak in R knee and ankle.  Completed kitchen mobility from w/c level practicing obtaining items from overhead cabinet using reacher and was able to reach forward to floor to pick up dropped item. Declined practicing standing at counter.  He returned to room and completed toileting task. He struggled to pull pants up via lateral leans, however, was able to push up on grab bar (he has this at home) with L UE and pull pants up on R. Pt returned to supine at end of session, left with all needs in reach and RN entering.  Therapy Documentation Precautions:  Precautions Precautions: Cervical Precaution Comments: from previous cervical spine fx Required Braces or Orthoses: Cervical Brace Cervical Brace: Hard collar, At all times Other Brace/Splint: cam boot on RLE for comfort with weight bearing Restrictions Weight Bearing Restrictions: Yes RLE Weight Bearing: Weight bearing as tolerated LLE Weight Bearing: Non weight bearing Pain: Pain Assessment Pain Assessment: No/denies pain Pain Score: 0-No pain  See Function Navigator for Current Functional Status.   Therapy/Group: Individual Therapy  Lewis, Jed Kutch C 07/19/2016, 7:03 AM

## 2016-07-19 NOTE — Progress Notes (Signed)
Patient ID: [name], male   DOB: June 15, 1961, 56 y.o.   MRN: [id number]   I have reviewed the patient's cervical x-rays performed at [facility] today.  There is no instability with limited flexion and extension.  Therefore it is okay to discontinue his cervical collar.  Please call if I can be of further assistance.

## 2016-07-19 NOTE — Progress Notes (Signed)
[facility] Progress Note  Subjective  Doing well  Up working with therapy  Pain well controlled   ROS No phantom pains   Objective   BP 118/70 (BP Location: Right Arm)   Pulse 72   Temp 98.5 F (36.9 C) (Oral)   Resp 18   Ht 6\' 1"  (1.854 m)   Wt 84.4 kg (186 lb 1.1 oz)   SpO2 98%   BMI 24.55 kg/m   Intake/Output      01/11 0701 - 01/12 0700 01/12 0701 - 01/13 0700   P.O. 480 240   Total Intake(mL/kg) 480 (5.7) 240 (2.8)   Urine (mL/kg/hr) 1100 (0.5) 250 (1)   Total Output 1100 250   Net -620 -10        Urine Occurrence 1 x      Exam  Gen: awake and alert, NAD  Ext:       Left Lower Extremity   Incision looks excellent  Scant drainage  Swelling well controlled  Excellent knee ROM   Excellent hip ROM and control     Assessment and Plan   POD/HD#: 77  56 y/o white male s/p 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             dressing changed             dressing changes as needed  Ok to place stump shrinker at any time   Can shower and clean wound with soap and water only               AROM and PROM L knee as tolerated             No restrictions for L hip              continue with knee immobilizer to help with knee extension, can be off when working with therapy                            Ice and elevate              PT/OT   - open R talo-calcaneal fracture dislocation s/p ORIF 04/09/2016             WBAT R leg with CAM             Ankle and subtalar motion as tolerated             PT/OT             TED hose   - pain management   Continue per CIR  - Dispo   Continue with therapies   Will see Monday or Tuesday     Jari Pigg, PA-C [facility] [phone] (P) [phone] (O) 07/19/2016 9:51 AM

## 2016-07-19 NOTE — Progress Notes (Signed)
Occupational Therapy Session Note  Patient Details  Name: AVONTAY KREINBRINK MRN: [id number] Date of Birth: 1961/05/07  Today's Date: 07/19/2016 OT Individual Time: [id number] OT Individual Time Calculation (min): 28 min     Short Term Goals: Week 1:  OT Short Term Goal 1 (Week 1): STG=LTG due to LOS    Skilled Therapeutic Interventions/Progress Updates:   Pt was seen for skilled OT session focusing on clothing mgt and dynamic standing balance. Pt was in w/c at time of arrival, agreeable to tx. Pt practiced lifting and lowering pants over hips standing at sink with pt able to use L UE during task with encouragement. Pt exhibits increased feelings of fearfulness when taking L UE off of sink, but willing to continue practicing with understanding that confidence with this will increase in time. Squat pivot to w/c>bed completed with close supervision. Pt completed clothing mgt at EOB while utilizing lateral leaning, pt able to lower/lift pants over hips with multiple trials and extra time. Pt reported feeling more comfortable with lateral leaning at this time during self care tasks. Pt then transferred back to w/c in manner as written above. At end of session pt was left in w/c with all needs within reach. ACE bandages rewrapped and KI donned at time of departure per pt request.     Therapy Documentation Precautions:  Precautions Precautions: Cervical Precaution Comments: from previous cervical spine fx Required Braces or Orthoses: Cervical Brace Cervical Brace: Hard collar, At all times Other Brace/Splint: cam boot on RLE for comfort with weight bearing Restrictions Weight Bearing Restrictions: Yes RLE Weight Bearing: Weight bearing as tolerated LLE Weight Bearing: Non weight bearing   Pain: No c/o pain during session    ADL:      See Function Navigator for Current Functional Status.   Therapy/Group: Individual Therapy  Kaniesha Barile A Ineta Sinning 07/19/2016, 12:37 PM

## 2016-07-19 NOTE — Progress Notes (Signed)
Physical Therapy Session Note  Patient Details  Name: [name] MRN: [id number] Date of Birth: May 15, 1961  Today's Date: 07/19/2016 PT Individual Time: 0800-0930 PT Individual Time Calculation (min): 90 min    Short Term Goals: Week 1:  PT Short Term Goal 1 (Week 1): =LTG due to estimated LOS  Skilled Therapeutic Interventions/Progress Updates:   Pt received seated on EOB; denies pain and agreeable to treatment. Pt dons R sock with setupA; requires totalA for donning CAM boot. Removes LLE knee immobilizer without assist. Squat pivot transfer bed >w/c >mat table with S. W/c propulsion to gym with BUE; min cues for UE technique to increase energy efficiency and maintain shoulder integrity. Sit <>stand from mat table with RW and close S. Performed 2 sets hopping in place x10 reps each with min guard, cues for scapular depression to increase foot clearance and control of movement. Gait x20' with RW and minA; +2 w/c follow for safety d/t first trial with RW. Pt reports more fatiguing than parallel bars. Second trial x20' minA only and improved foot placement with reduced swing-through. Long sitting hamstring stretch x2 min BLE.  2x15 bridging with bolster, straight leg raise (3# cuff weight on knee). Semi-reclined situps with ball throw 2x15, russian twist 2x20 with 4# ball. Sit <>stand x3 reps from lower mat table with RW and S. Stand pivot transfer mat table >w/c with RW and minA. W/c propulsion to room modI. Remained seated in w/c with quick release belt intact, setup at sink to perform oral hygiene modI.  Therapy Documentation Precautions:  Precautions Precautions: Cervical Precaution Comments: from previous cervical spine fx Required Braces or Orthoses: Cervical Brace Cervical Brace: Hard collar, At all times Other Brace/Splint: cam boot on RLE for comfort with weight bearing Restrictions Weight Bearing Restrictions: Yes RLE Weight Bearing: Weight bearing as tolerated LLE Weight Bearing:  Non weight bearing Pain: Pain Assessment Pain Assessment: No/denies pain Pain Score: 3  Pain Type: Acute pain Pain Location: Leg Pain Orientation: Left Pain Descriptors / Indicators: Discomfort Pain Intervention(s): Medication (See eMAR)  See Function Navigator for Current Functional Status.   Therapy/Group: Individual Therapy  Luberta Mutter 07/19/2016, 9:23 AM

## 2016-07-19 NOTE — Progress Notes (Signed)
[facility] PHYSICAL MEDICINE & REHABILITATION     PROGRESS NOTE    Subjective/Complaints: Up with PT. No new issues. Slept well. "working hard", "sore"   ROS: pt denies nausea, vomiting, diarrhea, cough, shortness of breath or chest pain    Objective: Vital Signs: Blood pressure 118/70, pulse 72, temperature 98.5 F (36.9 C), temperature source Oral, resp. rate 18, height 6\' 1"  (1.854 m), weight 84.4 kg (186 lb 1.1 oz), SpO2 98 %. No results found. No results for input(s): WBC, HGB, HCT, PLT in the last 72 hours. No results for input(s): NA, K, CL, GLUCOSE, BUN, CREATININE, CALCIUM in the last 72 hours.  Invalid input(s): CO CBG (last 3)  No results for input(s): GLUCAP in the last 72 hours.  Wt Readings from Last 3 Encounters:  07/17/16 84.4 kg (186 lb 1.1 oz)  07/11/16 83.9 kg (185 lb)  06/12/16 80.5 kg (177 lb 6.4 oz)    Physical Exam:  Constitutional: He is oriented to person, place, and time. NAD. Vital signs reviewed. HENT:  Head: Normocephalic. Atraumatic. Eyes: EOMare normal. No discharge.  Cardiovascular: RRR Respiratory: clear.  GI: Soft. Bowel sounds are normal. He exhibits no distension Musc: +Edema decreasing at stump. It's maintaining a good shape Neurological: He is oriented to person, place, and time.  Engages quickly. Fair insight.  Motor: 4+-5/5 B/l UE, RLE, LLE HF.   Skin. Staples intact, mild discharge from incision.   Assessment/Plan: 1. Functional and mobility deficits secondary to left BKA which require 3+ hours per day of interdisciplinary therapy in a comprehensive inpatient rehab setting. Physiatrist is providing close team supervision and 24 hour management of active medical problems listed below. Physiatrist and rehab team continue to assess barriers to discharge/monitor patient progress toward functional and medical goals.  Function:  Bathing Bathing position   Position: Shower  Bathing parts Body parts bathed by patient: Right arm,  Left arm, Chest, Abdomen, Front perineal area, Right upper leg, Left upper leg, Right lower leg, Left lower leg, Buttocks Body parts bathed by helper: Back  Bathing assist Assist Level: Touching or steadying assistance(Pt > 75%)      Upper Body Dressing/Undressing Upper body dressing   What is the patient wearing?: Pull over shirt/dress     Pull over shirt/dress - Perfomed by patient: Thread/unthread right sleeve, Thread/unthread left sleeve, Put head through opening, Pull shirt over trunk          Upper body assist Assist Level: Set up   Set up : To obtain clothing/put away  Lower Body Dressing/Undressing Lower body dressing   What is the patient wearing?: Underwear, Pants, AFO (CAM boot) Underwear - Performed by patient: Thread/unthread right underwear leg Underwear - Performed by helper: Thread/unthread left underwear leg, Pull underwear up/down Pants- Performed by patient: Thread/unthread right pants leg Pants- Performed by helper: Thread/unthread left pants leg, Pull pants up/down               AFO - Performed by helper: Don/doff right AFO (R CAM boot)      Lower body assist Assist for lower body dressing: Touching or steadying assistance (Pt > 75%)      Toileting Toileting   Toileting steps completed by patient: Adjust clothing prior to toileting, Performs perineal hygiene Toileting steps completed by helper: Adjust clothing after toileting (assisted) Toileting Assistive Devices: Toilet aid  Toileting assist Assist level: Touching or steadying assistance (Pt.75%)   Transfers Chair/bed transfer   Chair/bed transfer method: Squat pivot Chair/bed transfer assist level:  Supervision or verbal cues Chair/bed transfer assistive device: Armrests     Locomotion Ambulation Ambulation activity did not occur: Safety/medical concerns   Max distance: 20 Assist level: 2 helpers (+2 w/c follow on first trial, minA second trial)   Wheelchair   Type: Manual Max  wheelchair distance: 175 Assist Level: No help, No cues, assistive device, takes more than reasonable amount of time  Cognition Comprehension Comprehension assist level: Understands basic 90% of the time/cues < 10% of the time  Expression Expression assist level: Expresses basic 90% of the time/requires cueing < 10% of the time.  Social Interaction Social Interaction assist level: Interacts appropriately 90% of the time - Needs monitoring or encouragement for participation or interaction.  Problem Solving Problem solving assist level: Solves basic 50 - 74% of the time/requires cueing 25 - 49% of the time  Memory Memory assist level: Recognizes or recalls 50 - 74% of the time/requires cueing 25 - 49% of the time   Medical Problem List and Plan: 1.  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 Pt is working through pain  -WBAT RLE 2.  DVT Prophylaxis/Anticoagulation: Subcutaneous Lovenox. Monitor for any bleeding episode 3. Pain Management:  Good control overall  -Nucynta 100 mg every 6 hours, Neurontin 300 mg 3 times a day, Robaxin and oxycodone as needed is effective 4. Mood: Ritalin 5 mg twice a day---continue 5. Neuropsych: This patient is capable of making decisions on his own behalf. 6. Skin/Wound Care: continue current dressing---oil emersion,gauze, ACE for now  -would hold off on shrinker until beginning of the week  -rewrapped wound today 7. Fluids/Electrolytes/Nutrition: Routine I&O with follow-up chemistries reviewed and within acceptable limits  -protein stores should increase over time 8. History of right proximal fibula fracture with ORIF external fixator. Weightbearing as tolerated with Cam boot 9. History of C2-3-5 fractures with extradural hematoma. Cervical collar presently in place.   -cervical films ordered by NS. Potentially remove collar 10. Hypertension/tachycardia. Lopressor 50 mg twice a  day 11. Constipation. Laxative assistance   LOS (Days) 4 A FACE TO FACE EVALUATION WAS PERFORMED  [name], MD 07/19/2016 9:33 AM

## 2016-07-20 ENCOUNTER — Inpatient Hospital Stay (HOSPITAL_COMMUNITY): Payer: PRIVATE HEALTH INSURANCE | Admitting: Physical Therapy

## 2016-07-20 DIAGNOSIS — Z87820 Personal history of traumatic brain injury: Secondary | ICD-10-CM

## 2016-07-20 DIAGNOSIS — S12101S Unspecified nondisplaced fracture of second cervical vertebra, sequela: Secondary | ICD-10-CM

## 2016-07-20 DIAGNOSIS — I10 Essential (primary) hypertension: Secondary | ICD-10-CM

## 2016-07-20 DIAGNOSIS — Z89512 Acquired absence of left leg below knee: Secondary | ICD-10-CM

## 2016-07-20 DIAGNOSIS — G8918 Other acute postprocedural pain: Secondary | ICD-10-CM

## 2016-07-20 NOTE — Progress Notes (Signed)
Physical Therapy Session Note  Patient Details  Name: [name] MRN: [id number] Date of Birth: 03-12-1961  Today's Date: 07/20/2016 PT Individual Time: 1000-1045 PT Individual Time Calculation (min): 45 min    Short Term Goals: Week 1:  PT Short Term Goal 1 (Week 1): =LTG due to estimated LOS  Skilled Therapeutic Interventions/Progress Updates:    Pt was seen bedside in the am sitting up in w/c. Pt propelled w/c about 175 feet with B UEs and no assist with increased time required. Treatment in gym focused and ROM and strengthening L BKA. Pt performed knee flex/ext and quad sets, 3 sets x 10 reps each. Also discussed figure eight wrapping of residual limb with pt and wife. Pt returned to room following treatment and left sitting up in w/c with wife at bedside.   Therapy Documentation Precautions:  Precautions Precautions: Cervical Precaution Comments: from previous cervical spine fx Required Braces or Orthoses: Cervical Brace Cervical Brace: Hard collar, At all times Other Brace/Splint: cam boot on RLE for comfort with weight bearing Restrictions Weight Bearing Restrictions: Yes RLE Weight Bearing: Weight bearing as tolerated LLE Weight Bearing: Non weight bearing General:   Pain: Pt c/o 3/10 pain/soreness L thigh.   See Function Navigator for Current Functional Status.   Therapy/Group: Individual Therapy  Dub Amis 07/20/2016, 12:52 PM

## 2016-07-20 NOTE — Progress Notes (Signed)
Pt's wife spent night at pt's bedside.  Pt complained of pain primarily in his right leg and foot from all the physical therapy, but pain was never more than 5 and controlled with medications and positioning. Pt appeared to rest well. Uneventful shift.  Rayann Heman

## 2016-07-20 NOTE — Progress Notes (Signed)
[facility] PHYSICAL MEDICINE & REHABILITATION     PROGRESS NOTE    Subjective/Complaints: Pt seen laying in bed this AM.  He slept well overnight.  Wife at bedside, confirms.    ROS: Denies nausea, vomiting, diarrhea, cough, shortness of breath or chest pain  Objective: Vital Signs: Blood pressure 113/82, pulse 71, temperature 97.8 F (36.6 C), temperature source Oral, resp. rate 17, height 6\' 1"  (1.854 m), weight 84.4 kg (186 lb 1.1 oz), SpO2 97 %. Dg Cervical Spine With Flex & Extend  Result Date: 07/19/2016 CLINICAL DATA:  Neck pain.  History of C2 fracture. EXAM: CERVICAL SPINE COMPLETE WITH FLEXION AND EXTENSION VIEWS COMPARISON:  CT cervical spine 05/22/2016. FINDINGS: The patient's C2 fracture is better demonstrated on the prior plain films. Mild anterior displacement of the odontoid process on the C2 body appears unchanged. The neck is in mild flexion. There is some loss of disc space height at C5-6. Range of motion is limited with flexion and extension but no pathologic motion is identified. IMPRESSION: C2 fracture as seen on prior CT scan. Negative for pathologic motion. Range of motion is limited. C5-6 degenerative disc disease. No acute abnormality. Electronically Signed   By: Inge Rise M.D.   On: 07/19/2016 13:45   No results for input(s): WBC, HGB, HCT, PLT in the last 72 hours. No results for input(s): NA, K, CL, GLUCOSE, BUN, CREATININE, CALCIUM in the last 72 hours.  Invalid input(s): CO CBG (last 3)  No results for input(s): GLUCAP in the last 72 hours.  Wt Readings from Last 3 Encounters:  07/17/16 84.4 kg (186 lb 1.1 oz)  07/11/16 83.9 kg (185 lb)  06/12/16 80.5 kg (177 lb 6.4 oz)    Physical Exam:  Constitutional: NAD. Vital signs reviewed. HENT: Normocephalic. Atraumatic. Eyes: EOMare normal. No discharge.  Cardiovascular: RRR. No JVD. Respiratory: clear. Unlabored. GI: Soft. Bowel sounds are normal.  Musc: +Edema decreasing at stump. Mild  tenderness Neurological: He is alert and oriented.  Motor: 4+-5/5 B/l UE, RLE, LLE HF (stable).   Skin. Stump with dressing c/d/i.  Assessment/Plan: 1. Functional and mobility deficits secondary to left BKA which require 3+ hours per day of interdisciplinary therapy in a comprehensive inpatient rehab setting. Physiatrist is providing close team supervision and 24 hour management of active medical problems listed below. Physiatrist and rehab team continue to assess barriers to discharge/monitor patient progress toward functional and medical goals.  Function:  Bathing Bathing position   Position: Shower  Bathing parts Body parts bathed by patient: Right arm, Left arm, Chest, Abdomen, Front perineal area, Right upper leg, Left upper leg, Right lower leg, Left lower leg, Buttocks Body parts bathed by helper: Back  Bathing assist Assist Level: Supervision or verbal cues      Upper Body Dressing/Undressing Upper body dressing   What is the patient wearing?: Pull over shirt/dress     Pull over shirt/dress - Perfomed by patient: Thread/unthread right sleeve, Thread/unthread left sleeve, Put head through opening, Pull shirt over trunk          Upper body assist Assist Level: More than reasonable time   Set up : To obtain clothing/put away  Lower Body Dressing/Undressing Lower body dressing   What is the patient wearing?: Underwear, Pants, AFO, Non-skid slipper socks Underwear - Performed by patient: Thread/unthread right underwear leg, Thread/unthread left underwear leg Underwear - Performed by helper: Pull underwear up/down Pants- Performed by patient: Thread/unthread right pants leg, Thread/unthread left pants leg Pants- Performed by  helper: Pull pants up/down Non-skid slipper socks- Performed by patient: Don/doff right sock (L N/A)             AFO - Performed by helper: Don/doff right AFO      Lower body assist Assist for lower body dressing: Touching or steadying  assistance (Pt > 75%)      Toileting Toileting   Toileting steps completed by patient: Adjust clothing prior to toileting Toileting steps completed by helper: Adjust clothing after toileting (assisted) Toileting Assistive Devices: Toilet aid  Toileting assist Assist level: Touching or steadying assistance (Pt.75%)   Transfers Chair/bed transfer   Chair/bed transfer method: Squat pivot Chair/bed transfer assist level: Supervision or verbal cues Chair/bed transfer assistive device: Armrests     Locomotion Ambulation Ambulation activity did not occur: Safety/medical concerns   Max distance: 20 Assist level: 2 helpers (+2 w/c follow on first trial, minA second trial)   Wheelchair   Type: Manual Max wheelchair distance: 175 Assist Level: No help, No cues, assistive device, takes more than reasonable amount of time  Cognition Comprehension Comprehension assist level: Understands basic 90% of the time/cues < 10% of the time  Expression Expression assist level: Expresses basic 90% of the time/requires cueing < 10% of the time.  Social Interaction Social Interaction assist level: Interacts appropriately 90% of the time - Needs monitoring or encouragement for participation or interaction.  Problem Solving Problem solving assist level: Solves basic 50 - 74% of the time/requires cueing 25 - 49% of the time  Memory Memory assist level: Recognizes or recalls 50 - 74% of the time/requires cueing 25 - 49% of the time   Medical Problem List and Plan: 1.  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  DVT Prophylaxis/Anticoagulation: Subcutaneous Lovenox. Monitor for any bleeding episode 3. Pain Management:  Good control overall  -Nucynta 100 mg every 6 hours, Neurontin 300 mg 3 times a day, Robaxin and oxycodone as needed 4. Mood: Ritalin 5 mg twice a day 5. Neuropsych: This patient is capable of making  decisions on his own behalf. 6. Skin/Wound Care: continue current dressing  oil emersion,gauze, ACE for now  Can shower with soap and water  Will plan to order shrinker on Monday, cleared by Surgery as well, per note 7. Fluids/Electrolytes/Nutrition: Routine I&Os 8. History of right proximal fibula fracture with ORIF external fixator. Weightbearing as tolerated with Cam boot 9. History of C2-3-5 fractures with extradural hematoma.   Cxray reviewed, with limited ROM and DDD C5-6  May d/c cervical collar per Neurosurg  10. Hypertension/tachycardia. Lopressor 50 mg twice a day  WNL on 1/13 11. Constipation. Laxative assistance 12. ABLA  Hb 11.2 on 1/9  Cont to monitor   LOS (Days) 5 A FACE TO FACE EVALUATION WAS PERFORMED  Tehila Sokolow [name], MD 07/20/2016 9:10 AM

## 2016-07-21 ENCOUNTER — Inpatient Hospital Stay (HOSPITAL_COMMUNITY): Payer: Self-pay | Admitting: Occupational Therapy

## 2016-07-21 ENCOUNTER — Inpatient Hospital Stay (HOSPITAL_COMMUNITY): Payer: PRIVATE HEALTH INSURANCE | Admitting: Physical Therapy

## 2016-07-21 NOTE — Plan of Care (Signed)
Problem: RH SKIN INTEGRITY Goal: RH STG ABLE TO PERFORM INCISION/WOUND CARE W/ASSISTANCE STG Able To Perform Incision/Wound Care With max Assistance.   Outcome: Progressing Wife assists in dressing change

## 2016-07-21 NOTE — Progress Notes (Signed)
[place] PHYSICAL MEDICINE & REHABILITATION     PROGRESS NOTE    Subjective/Complaints: Pt seen laying in bed this AM.  He slept well overnight.  Wife at bedside.  Per nursing, pt with come trouble voiding overnight.    ROS: Denies nausea, vomiting, diarrhea, shortness of breath or chest pain  Objective: Vital Signs: Blood pressure 135/84, pulse 71, temperature 98.1 F (36.7 C), temperature source Oral, resp. rate 18, height 6\' 1"  (1.854 m), weight 84.4 kg (186 lb 1.1 oz), SpO2 99 %. Dg Cervical Spine With Flex & Extend  Result Date: 07/19/2016 CLINICAL DATA:  Neck pain.  History of C2 fracture. EXAM: CERVICAL SPINE COMPLETE WITH FLEXION AND EXTENSION VIEWS COMPARISON:  CT cervical spine 05/22/2016. FINDINGS: The patient's C2 fracture is better demonstrated on the prior plain films. Mild anterior displacement of the odontoid process on the C2 body appears unchanged. The neck is in mild flexion. There is some loss of disc space height at C5-6. Range of motion is limited with flexion and extension but no pathologic motion is identified. IMPRESSION: C2 fracture as seen on prior CT scan. Negative for pathologic motion. Range of motion is limited. C5-6 degenerative disc disease. No acute abnormality. Electronically Signed   By: Inge Rise M.D.   On: 07/19/2016 13:45   No results for input(s): WBC, HGB, HCT, PLT in the last 72 hours. No results for input(s): NA, K, CL, GLUCOSE, BUN, CREATININE, CALCIUM in the last 72 hours.  Invalid input(s): CO CBG (last 3)  No results for input(s): GLUCAP in the last 72 hours.  Wt Readings from Last 3 Encounters:  07/17/16 84.4 kg (186 lb 1.1 oz)  07/11/16 83.9 kg (185 lb)  06/12/16 80.5 kg (177 lb 6.4 oz)    Physical Exam:  Constitutional: NAD. Vital signs reviewed. HENT: Normocephalic. Atraumatic. Eyes: EOMare normal. No discharge.  Cardiovascular: RRR. No JVD. Respiratory: Clear. Unlabored. GI: Soft. Bowel sounds are normal.  Musc:  +Edema decreasing at stump. Mild tenderness along suture line Neurological: He is alert and oriented.  Motor: 4+-5/5 B/l UE, RLE, LLE HF (stable).   Skin. Stump with sutures c/d/i  Assessment/Plan: 1. Functional and mobility deficits secondary to left BKA which require 3+ hours per day of interdisciplinary therapy in a comprehensive inpatient rehab setting. Physiatrist is providing close team supervision and 24 hour management of active medical problems listed below. Physiatrist and rehab team continue to assess barriers to discharge/monitor patient progress toward functional and medical goals.  Function:  Bathing Bathing position   Position: Shower  Bathing parts Body parts bathed by patient: Right arm, Left arm, Chest, Abdomen, Front perineal area, Right upper leg, Left upper leg, Right lower leg, Left lower leg, Buttocks Body parts bathed by helper: Back  Bathing assist Assist Level: Supervision or verbal cues      Upper Body Dressing/Undressing Upper body dressing   What is the patient wearing?: Pull over shirt/dress     Pull over shirt/dress - Perfomed by patient: Thread/unthread right sleeve, Thread/unthread left sleeve, Put head through opening, Pull shirt over trunk          Upper body assist Assist Level: More than reasonable time   Set up : To obtain clothing/put away  Lower Body Dressing/Undressing Lower body dressing   What is the patient wearing?: Underwear, Pants, AFO, Non-skid slipper socks Underwear - Performed by patient: Thread/unthread right underwear leg, Thread/unthread left underwear leg Underwear - Performed by helper: Pull underwear up/down Pants- Performed by patient: Thread/unthread  right pants leg, Thread/unthread left pants leg Pants- Performed by helper: Pull pants up/down Non-skid slipper socks- Performed by patient: Don/doff right sock (L N/A)             AFO - Performed by helper: Don/doff right AFO      Lower body assist Assist for  lower body dressing: Touching or steadying assistance (Pt > 75%)      Toileting Toileting   Toileting steps completed by patient: Adjust clothing prior to toileting Toileting steps completed by helper: Adjust clothing after toileting (assisted) Toileting Assistive Devices: Toilet aid  Toileting assist Assist level: Touching or steadying assistance (Pt.75%)   Transfers Chair/bed transfer   Chair/bed transfer method: Squat pivot Chair/bed transfer assist level: Supervision or verbal cues Chair/bed transfer assistive device: Armrests     Locomotion Ambulation Ambulation activity did not occur: Safety/medical concerns   Max distance: 20 Assist level: 2 helpers (+2 w/c follow on first trial, minA second trial)   Wheelchair   Type: Manual Max wheelchair distance: 175 Assist Level: No help, No cues, assistive device, takes more than reasonable amount of time  Cognition Comprehension Comprehension assist level: Understands basic 90% of the time/cues < 10% of the time  Expression Expression assist level: Expresses basic 90% of the time/requires cueing < 10% of the time.  Social Interaction Social Interaction assist level: Interacts appropriately 90% of the time - Needs monitoring or encouragement for participation or interaction.  Problem Solving Problem solving assist level: Solves basic 50 - 74% of the time/requires cueing 25 - 49% of the time  Memory Memory assist level: Recognizes or recalls 50 - 74% of the time/requires cueing 25 - 49% of the time   Medical Problem List and Plan: 1.  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  DVT Prophylaxis/Anticoagulation: Subcutaneous Lovenox. Monitor for any bleeding episode 3. Pain Management:  Good control overall  -Nucynta 100 mg every 6 hours, Neurontin 300 mg 3 times a day, Robaxin and oxycodone as needed 4. Mood: Ritalin 5 mg twice a day 5.  Neuropsych: This patient is capable of making decisions on his own behalf. 6. Skin/Wound Care: continue current dressing  oil emersion,gauze  Can shower with soap and water  Will plan to order shrinker on Monday, cleared by Surgery as well 7. Fluids/Electrolytes/Nutrition: Routine I&Os 8. History of right proximal fibula fracture with ORIF external fixator. Weightbearing as tolerated with Cam boot 9. History of C2-3-5 fractures with extradural hematoma.   Cxray reviewed, with limited ROM and DDD C5-6  May d/c cervical collar per Neurosurg  10. Hypertension/tachycardia. Lopressor 50 mg twice a day  WNL on 1/14 11. Constipation. Laxative assistance 12. ABLA  Hb 11.2 on 1/9  Cont to monitor   LOS (Days) 6 A FACE TO FACE EVALUATION WAS PERFORMED  Zaeem Kandel [name], MD 07/21/2016 7:41 AM

## 2016-07-21 NOTE — Progress Notes (Signed)
Occupational Therapy Session Note  Patient Details  Name: [name] MRN: [id number] Date of Birth: 1960-09-02  Today's Date: 07/21/2016 OT Individual Time: [id number] and [id number] OT Individual Time Calculation (min): 46 min and 48 min   Short Term Goals: Week 1:  OT Short Term Goal 1 (Week 1): STG=LTG due to LOS     Skilled Therapeutic Interventions/Progress Updates:   Pt was in shower with girlfriend [name] at time of arrival without c-collar and left residual limb wrapped. He had just finished. Per MD, c collar has been discontinued since 1/12 however pt feels safer donning collar during therapies and therefore continues to wear during tx. Dressing completed on tub bench with pt utilizing lateral leaning technique for donning pants (once CAM boot was donned by [name]). Squat pivot<w/c completed with supervision. After, grooming/oral tasks completed w/c level at sink, pt and [name] engaged in family education regarding limb wrapping with [name] receiving hands on practice and extensive instruction on technique and frequency of wrapping at home. For remainder of session pt completed x20 w/c push ups with prolonged periods of elevation, emphasis on residual limb extension, UB strengthening, and pressure relief. Afterwards pt was left with [name] and all needs within reach.   2nd Session 1:1 Tx (48 min) Skilled OT session completed with focus on UB strengthening and w/c mobility. Pt was up in w/c at time of arrival, ready to go. He propelled to dayroom and engaged in w/c races with therapist in graded obstacle course, involving navigating around tight spaces, doorways, changing directions, and problem solving. Music utilized to increase volition and psychosocial wellness. Afterwards pt self propelled back to room and was left with [name] at time of departure.   Therapy Documentation Precautions:  Precautions Precautions: Cervical Precaution Comments: from previous cervical spine fx Required Braces  or Orthoses: Cervical Brace Cervical Brace: Hard collar, At all times Other Brace/Splint: cam boot on RLE for comfort with weight bearing Restrictions Weight Bearing Restrictions: Yes RLE Weight Bearing: Weight bearing as tolerated LLE Weight Bearing: Non weight bearing   Pain: No c/o pain during session    ADL:   See Function Navigator for Current Functional Status.   Therapy/Group: Individual Therapy  Mireya Meditz A Kahley Leib 07/21/2016, 12:54 PM

## 2016-07-21 NOTE — Progress Notes (Signed)
At 0130 voided in urinal, PVR=408. Assisted to BR, able to void. PVR at 0650=0. Wife wrapped left BKA with min assistance. Continue to educate daily. [name] A

## 2016-07-21 NOTE — Progress Notes (Signed)
Physical Therapy Session Note  Patient Details  Name: [name] MRN: [id number] Date of Birth: 11-29-1960  Today's Date: 07/21/2016 PT Individual Time: [id number] and [id number] PT Individual Time Calculation (min): 65 min and 43 min   Short Term Goals: Week 1:  PT Short Term Goal 1 (Week 1): =LTG due to estimated LOS  Skilled Therapeutic Interventions/Progress Updates:    Treatment 1: Pt received in w/c, with wife [name]) present for session, & agreeable to tx. Pt denied c/o pain. Session focused on transfers, ambulation, and bed mobility. Gait training x 10 ft over even surface + 10 ft over uneven surface with steady assist and min assist respectively. Pt requires cuing for RW management to transition over even/uneven surfaces. Pt completed lateral scoot transfers throughout session (w/c<>bed, w/c<>mat table, w/c<>car) with min assist overall. Pt with difficulty pivoting with RLE and instead performs multiple lateral scoots & requires min assist for safety.  Discussed safety with car transfer via lateral scoot vs slide board. Pt requires supervision for slide board transfer w/c<>simulated low car, with therapist stabilizing board only. Pt & therapist discussed improved safety with use of board & pt agreeable to using this for car access. Pt performed bed mobility (rolling L/R, supine<>sit) with supervision after education for supine>sidelying>push up to sitting. Pt's wife present for session, but did not participate in hands on practice; [name] voices that she feels comfortable assisting pt with mobility, as she was helping him prior to admission. Educated pt & wife on importance of maintaining L knee extension for future prosthetic use. During session pt able to manage w/c parts (leg rests, locks, arm rests) with supervision & occasional cuing.  At end of session pt left sitting in recliner with BLE elevated, all needs within reach, & wife & RN present. Pt's wife stated she would assist pt with  donning KI.   Treatment 2: Pt received in w/c & agreeable to tx, denying c/o pain. Session focused on w/c mobility, cardiopulmonary endurance training, & pt/family education. Pt propelled w/c throughout unit with BUE & supervision with cuing to reach back farther on wheels to produce bigger pushes with fair demo by pt. Provided pt with w/c gloves. Pt negotiated ramp in w/c with supervision; educated pt on anterior weight shift when ascending ramp and pt with good return demo. Pt with good control of w/c speed when descending ramp. Pt utilized BUE ergometer 3 minutes forwards + 3 minutes backwards on level 2.2 for cardiopulmonary endurance training. Educated pt & wife [name]) on transition from ace wrap to shrinker and eventually a prosthetic limb. Educated pt on importance of L knee extension & pt required frequent cuing to maintain extension during session. At end of session pt left sitting in w/c in room with family present & LLE immobilizer donned.  Therapy Documentation Precautions:  Precautions Precautions: Cervical Precaution Comments: from previous cervical spine fx Required Braces or Orthoses: Cervical Brace Cervical Brace: Hard collar, At all times Other Brace/Splint: cam boot on RLE for comfort with weight bearing Restrictions Weight Bearing Restrictions: Yes RLE Weight Bearing: Weight bearing as tolerated LLE Weight Bearing: Non weight bearing   See Function Navigator for Current Functional Status.   Therapy/Group: Individual Therapy  [name] 07/21/2016, 2:46 PM

## 2016-07-22 ENCOUNTER — Inpatient Hospital Stay (HOSPITAL_COMMUNITY): Payer: PRIVATE HEALTH INSURANCE | Admitting: Occupational Therapy

## 2016-07-22 ENCOUNTER — Inpatient Hospital Stay (HOSPITAL_COMMUNITY): Payer: Self-pay | Admitting: Physical Therapy

## 2016-07-22 NOTE — Progress Notes (Signed)
[place] PHYSICAL MEDICINE & REHABILITATION     PROGRESS NOTE    Subjective/Complaints: Pt seen laying in bed this AM.  He slept well overnight.  Wife at bedside.  Per nursing, pt with come trouble voiding overnight.    ROS: Denies nausea, vomiting, diarrhea, shortness of breath or chest pain  Objective: Vital Signs: Blood pressure 111/75, pulse 70, temperature 98.4 F (36.9 C), temperature source Oral, resp. rate 18, height 6\' 1"  (1.854 m), weight 84.4 kg (186 lb 1.1 oz), SpO2 95 %. No results found. No results for input(s): WBC, HGB, HCT, PLT in the last 72 hours. No results for input(s): NA, K, CL, GLUCOSE, BUN, CREATININE, CALCIUM in the last 72 hours.  Invalid input(s): CO CBG (last 3)  No results for input(s): GLUCAP in the last 72 hours.  Wt Readings from Last 3 Encounters:  07/17/16 84.4 kg (186 lb 1.1 oz)  07/11/16 83.9 kg (185 lb)  06/12/16 80.5 kg (177 lb 6.4 oz)    Physical Exam:  Constitutional: NAD. Vital signs reviewed. HENT: Normocephalic. Atraumatic. Eyes: EOMare normal. No discharge.  Cardiovascular: RRR. No JVD. Respiratory: Clear. Unlabored. GI: Soft. Bowel sounds are normal.  Musc: +Edema decreasing at stump. Mild tenderness along suture line Neurological: He is alert and oriented.  Motor: 4+-5/5 B/l UE, RLE, LLE HF (stable).   Skin. Stump with sutures c/d/i  Assessment/Plan: 1. Functional and mobility deficits secondary to left BKA which require 3+ hours per day of interdisciplinary therapy in a comprehensive inpatient rehab setting. Physiatrist is providing close team supervision and 24 hour management of active medical problems listed below. Physiatrist and rehab team continue to assess barriers to discharge/monitor patient progress toward functional and medical goals.  Function:  Bathing Bathing position   Position: Shower  Bathing parts Body parts bathed by patient: Right arm, Left arm, Chest, Abdomen, Front perineal area, Right upper  leg, Left upper leg, Right lower leg, Left lower leg, Buttocks Body parts bathed by helper: Back  Bathing assist Assist Level: Supervision or verbal cues      Upper Body Dressing/Undressing Upper body dressing   What is the patient wearing?: Pull over shirt/dress     Pull over shirt/dress - Perfomed by patient: Thread/unthread right sleeve, Thread/unthread left sleeve, Put head through opening, Pull shirt over trunk          Upper body assist Assist Level: More than reasonable time   Set up : To obtain clothing/put away  Lower Body Dressing/Undressing Lower body dressing   What is the patient wearing?: Underwear, Pants, AFO, Non-skid slipper socks Underwear - Performed by patient: Thread/unthread right underwear leg, Thread/unthread left underwear leg, Pull underwear up/down Underwear - Performed by helper: Pull underwear up/down Pants- Performed by patient: Thread/unthread right pants leg, Thread/unthread left pants leg, Pull pants up/down Pants- Performed by helper: Pull pants up/down Non-skid slipper socks- Performed by patient: Don/doff right sock             AFO - Performed by helper: Don/doff right AFO (completed by significant partner [name])      Lower body assist Assist for lower body dressing: Touching or steadying assistance (Pt > 75%)      Toileting Toileting   Toileting steps completed by patient: Adjust clothing prior to toileting Toileting steps completed by helper: Adjust clothing after toileting (assisted) Toileting Assistive Devices: Toilet aid  Toileting assist Assist level: Touching or steadying assistance (Pt.75%)   Transfers Chair/bed transfer   Chair/bed transfer method: Lateral scoot, Squat  pivot Chair/bed transfer assist level: Supervision or verbal cues Chair/bed transfer assistive device: Armrests     Locomotion Ambulation Ambulation activity did not occur: Safety/medical concerns   Max distance: 10 ft Assist level: Touching or  steadying assistance (Pt > 75%)   Wheelchair   Type: Manual Max wheelchair distance: 150 ft Assist Level: Supervision or verbal cues  Cognition Comprehension Comprehension assist level: Understands basic 90% of the time/cues < 10% of the time  Expression Expression assist level: Expresses basic 90% of the time/requires cueing < 10% of the time.  Social Interaction Social Interaction assist level: Interacts appropriately 90% of the time - Needs monitoring or encouragement for participation or interaction.  Problem Solving Problem solving assist level: Solves basic 50 - 74% of the time/requires cueing 25 - 49% of the time  Memory Memory assist level: Recognizes or recalls 50 - 74% of the time/requires cueing 25 - 49% of the time   Medical Problem List and Plan: 1.  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  DVT Prophylaxis/Anticoagulation: Subcutaneous Lovenox. Monitor for any bleeding episode 3. Pain Management:  Good control    -Nucynta 100 mg every 6 hours, Neurontin 300 mg 3 times a day, Robaxin and oxycodone as needed 4. Mood: Ritalin 5 mg twice a day 5. Neuropsych: This patient is capable of making decisions on his own behalf. 6. Skin/Wound Care: -placed silver shrinker today---pt assisted with donning  -does not need dressing underneath 7. Fluids/Electrolytes/Nutrition: Routine I&Os 8. History of right proximal fibula fracture with ORIF external fixator. Weightbearing as tolerated with Cam boot 9. History of C2-3-5 fractures with extradural hematoma.   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 Hypertension/tachycardia. Lopressor 50 mg twice a day  WNL on 1/14 11. Constipation. Laxative assistance 12. ABLA  Hb 11.2 on 1/9  Cont to monitor   LOS (Days) 7 A FACE TO FACE EVALUATION WAS PERFORMED  [name], MD 07/22/2016 8:56  AM

## 2016-07-22 NOTE — Progress Notes (Signed)
Physical Therapy Session Note  Patient Details  Name: [name] MRN: [id number] Date of Birth: [birth year]  Today's Date: 07/22/2016 PT Individual Time: 0900-1000 PT Individual Time Calculation (min): 60 min    Short Term Goals: Week 1:  PT Short Term Goal 1 (Week 1): =LTG due to estimated LOS  Skilled Therapeutic Interventions/Progress Updates:   Pt received seated in w/c, with pain as described below and agreeable to treatment. W/c propulsion to gym with BUE and modI. Pt with significant ROM limitations d/t prolonged immobilization in cervical collar; <10 degrees of AROM in all directions (side bending, rotation, flexion/extension), with R torticollis. Performed soft tissue massage to B traps, SCM, scalenes. Performed PROM to neck rotation/side flexion in supine with PNF contract/relax. Seated AROM flexion/extension, lateral flexion, rotation R/L. Provided pt with handouts for self AROM stretches for neck. Stand pivot transfer w/c <>mat table with RW and min guard. Gait with RW and min guard x30'; min cues for maintaining proper position within RW. Pt reports increase in RLE ankle pain after gait trial. W/c propulsion to return to room with modI. Remained seated in w/c at end of session, all needs in reach. D/c safety belt at this time due to improving pt safety awareness and understanding of recommendation to call for staff and wait for assist.   Therapy Documentation Precautions:  Precautions Precautions: Cervical Precaution Comments: from previous cervical spine fx Required Braces or Orthoses: Cervical Brace Cervical Brace: Hard collar, At all times Other Brace/Splint: cam boot on RLE for comfort with weight bearing Restrictions Weight Bearing Restrictions: Yes RLE Weight Bearing: Weight bearing as tolerated LLE Weight Bearing: Non weight bearing Pain: Pain Assessment Pain Assessment: 0-10 Pain Score: 3  Pain Type: Surgical pain Pain Location: Ankle Pain Orientation:  Right Pain Descriptors / Indicators: Aching Pain Frequency: Occasional Pain Onset: With Activity Patients Stated Pain Goal: 2 Pain Intervention(s): Medication (See eMAR)   See Function Navigator for Current Functional Status.   Therapy/Group: Individual Therapy  Luberta Mutter 07/22/2016, 9:54 AM

## 2016-07-22 NOTE — Progress Notes (Signed)
Occupational Therapy Session Note  Patient Details  Name: [name] MRN: [id number] Date of Birth: 05/31/1961  Today's Date: 07/22/2016 OT Individual Time: 1100-1200 and 1350-1500 OT Individual Time Calculation (min): 60 min and 70 min    Short Term Goals:Week 1:  OT Short Term Goal 1 (Week 1): STG=LTG due to LOS  Skilled Therapeutic Interventions/Progress Updates:    Session One: Pt seen for OT session focusing on ADL re-training, cervical stretching and functional standing balance. Pt sitting up in w/c upon arrival voicing desire to shave now that cervical brace has been removed. Completed with supervision/ set-up using electric razor. Pt voiced feeling much better following shaving. He self propelled w/c to therapy gym and completed squat pivot transfer to mat with supervision with VCs for full clearance during transfer.  From EOM, completed x3 sit <> stand to RW. Worked on pt letting go of RW with L UE in prep for functional standing task. Initially pt only able to let go for ~1 second before returning to RW. On third trial, pt able to maintain balance enough to remove clothes pin from around waist band with L UE in simulation of LB dressing task. Completed with steadying assist. Pt able to tolerate standing long enough to remove 4 clothes pins.  Light cervical stretching performed as pt with very limited ROM following prolonged time in cervical brace. Light massage provided to SCM. Also taught pt how to complete self stretching and stressed importance of not overstretching. Will review stretches with pt's wife as she is available.  Pt returned to w/c via squat pivot with supervision. Returned to room and left sitting up in w/c with all needs in reach.   Session Two: Pt seen for OT session focusing on ADL/IADL re-training, functional standing balance/ endurance, and cervical stretching. Pt sitting up in w/c upon arrival agreeable to tx session.  Self Care:  Completed squat pivot  transfer w/c <> toilet with use of grab bars and VCs for technique. Lateral leans to pull pants down. Following failed attempts to pull pants up via leans pt stood with use of grab bar to pull pants up and completed stand pivot transfer to w/c with min A for positioning of hips back into chair.   In ADL apartment, completed standing task at RW to remove and replace items from overhead cabinet. Pt stood with L UE support to remove items with close supervision. Attempted x2 trials to replace items with weaker L UE with heavy steadying assist from therapist. Reviewed with pt only to complete standing trials at home when wife is with him and also educated regarding ways to incorporate therapy/ standing into everyday tasks at home to cont to make functional gains with standing. Pt voiced understanding.  Therapeutic Activity: Cervical stretches/ ROM completed supine on mat, pt with limited head turn B, R>L as well as limited neck flexion. Completed standing trials from [facility] practicing letting go with L UE to cont to address balance and work towards functional standing balance in prep for toileting/ LB clothing management.  Completed UE strengthening exercises seated EOM x3 sets of 10 reps shoulder press, overhead press and bicep curls with VCs and demonstration for proper form and technique.   Pt returned to room at end of session, left sitting up in w/c with all needs in reach.   Therapy Documentation Precautions:  Precautions Precautions: Cervical Precaution Comments: from previous cervical spine fx Required Braces or Orthoses: Cervical Brace Cervical Brace: Hard collar, At all times Other  Brace/Splint: cam boot on RLE for comfort with weight bearing Restrictions Weight Bearing Restrictions: Yes RLE Weight Bearing: Weight bearing as tolerated LLE Weight Bearing: Non weight bearing  See Function Navigator for Current Functional Status.   Therapy/Group: Individual Therapy  Lewis, Nikesh Teschner  C 07/22/2016, 7:17 AM

## 2016-07-22 NOTE — Discharge Instructions (Signed)
Inpatient Rehab Discharge Instructions  [name] Discharge date and time: No discharge date for patient encounter.   Activities/Precautions/ Functional Status: Activity: activity as tolerated Diet: regular diet Wound Care: keep wound clean and dry Functional status:  ___ No restrictions     ___ Walk up steps independently ___ 24/7 supervision/assistance   ___ Walk up steps with assistance ___ Intermittent supervision/assistance  ___ Bathe/dress independently ___ Walk with walker     _x__ Bathe/dress with assistance ___ Walk Independently    ___ Shower independently ___ Walk with assistance    ___ Shower with assistance ___ No alcohol     ___ Return to work/school ________  Special Instructions:    My questions have been answered and I understand these instructions. I will adhere to these goals and the provided educational materials after my discharge from the hospital.  Patient/Caregiver Signature _______________________________ Date __________  Clinician Signature _______________________________________ Date __________  Please bring this form and your medication list with you to all your follow-up doctor's appointments.

## 2016-07-23 ENCOUNTER — Inpatient Hospital Stay (HOSPITAL_COMMUNITY): Payer: PRIVATE HEALTH INSURANCE | Admitting: Occupational Therapy

## 2016-07-23 ENCOUNTER — Inpatient Hospital Stay (HOSPITAL_COMMUNITY): Payer: PRIVATE HEALTH INSURANCE | Admitting: Physical Therapy

## 2016-07-23 NOTE — Progress Notes (Signed)
Occupational Therapy Discharge Summary  Patient Details  Name: [name] MRN: [id number] Date of Birth: Sep 05, 1960   Patient has met 9 of 9 long term goals due to improved activity tolerance, improved balance, postural control, ability to compensate for deficits and improved coordination.  Patient to discharge at overall Supervision level.  Patient's care partner is independent to provide the necessary physical and cognitive assistance at discharge.    Pt able to complete all basic ADLs with supervision. Occasional steadying assist required for dynamic standing balance. When caregiver (wife) is present, pt is more self limiting compared to when working only with therapist. Pt's wife aware of this as he was like this PTA when he d/c home following TBI 9/17. Educated with wife regarding pt's current level of function and area for assist. Pt's wife completed hands on family education and voiced willing and ableness to provide all physical and cognitive needs at d/c.   Recommendation:  Patient will benefit from ongoing skilled OT services in home health setting to continue to advance functional skills in the area of BADL, iADL and Reduce care partner burden.  Equipment: Pt has drop arm BSC and tub transfer bench  Reasons for discharge: treatment goals met and discharge from hospital  Patient/family agrees with progress made and goals achieved: Yes  OT Discharge Precautions/Restrictions  Precautions Precautions: None Other Brace/Splint: cam boot on RLE for comfort with weight bearing Restrictions Weight Bearing Restrictions: Yes RLE Weight Bearing: Weight bearing as tolerated LLE Weight Bearing: Non weight bearing  Vision/Perception  Vision- History Baseline Vision/History: No visual deficits Patient Visual Report: No change from baseline Vision- Assessment Vision Assessment?: No apparent visual deficits  Cognition Overall Cognitive Status: History of cognitive impairments - at  baseline (TBI 9/17) Arousal/Alertness: Awake/alert Orientation Level: Oriented X4 Attention: Selective Selective Attention: Impaired Selective Attention Impairment: Verbal basic;Functional basic Memory: Impaired Memory Impairment: Decreased short term memory;Decreased recall of new information Decreased Short Term Memory: Verbal complex;Functional complex Awareness: Impaired Awareness Impairment: Emergent impairment Problem Solving: Impaired Problem Solving Impairment: Verbal complex;Functional complex Safety/Judgment: Appears intact Sensation Sensation Light Touch: Appears Intact Proprioception: Appears Intact Coordination Gross Motor Movements are Fluid and Coordinated: No Fine Motor Movements are Fluid and Coordinated: Yes Coordination and Movement Description: Impaired due to new BKA, however, much improved since admission Motor  Motor Motor: Within Functional Limits;Other (comment) Motor - Discharge Observations: Generalized deconditioning Mobility     Trunk/Postural Assessment  Cervical Assessment Cervical Assessment: Exceptions to [facility] (Pt's hard collar d/c during CIR stay, limited cervical mobility due to prolonged time in hard collar) Thoracic Assessment Thoracic Assessment: Within Functional Limits Lumbar Assessment Lumbar Assessment: Within Functional Limits Postural Control Postural Control: Deficits on evaluation (Decreased standing balance/ control due to new BKA, requiring at least one UE support during standing)  Balance Balance Balance Assessed: Yes Dynamic Sitting Balance Dynamic Sitting - Balance Support: No upper extremity supported;Feet supported;During functional activity Dynamic Sitting - Level of Assistance: 5: Stand by assistance Sitting balance - Comments: Reaching to floor during ADL tasks, pt fearful with high level dynamic sitting tasks, however, able to complete at stand by assist level Static Standing Balance Static Standing - Balance  Support: Left upper extremity supported Static Standing - Level of Assistance: 5: Stand by assistance Dynamic Standing Balance Dynamic Standing - Balance Support: During functional activity;Left upper extremity supported Dynamic Standing - Level of Assistance: 5: Stand by assistance Dynamic Standing - Balance Activities: Reaching for weighted objects;Reaching across midline Extremity/Trunk Assessment RUE Assessment RUE Assessment:  Within Functional Limits LUE Assessment LUE Assessment: Exceptions to [facility] (Limited shoulder flexion due to hx of injury; strength 4+/5 )   See Function Navigator for Current Functional Status.  Lewis, Kinleigh Nault C 07/23/2016, 7:21 AM

## 2016-07-23 NOTE — Progress Notes (Signed)
[facility] PHYSICAL MEDICINE & REHABILITATION     PROGRESS NOTE    Subjective/Complaints: Pt seen laying in bed this AM.  He slept well overnight.  Wife at bedside.  Per nursing, pt with come trouble voiding overnight.    ROS: Denies nausea, vomiting, diarrhea, shortness of breath or chest pain  Objective: Vital Signs: Blood pressure 120/81, pulse 68, temperature 98.1 F (36.7 C), temperature source Oral, resp. rate 17, height 6\' 1"  (1.854 m), weight 84.4 kg (186 lb 1.1 oz), SpO2 98 %. No results found. No results for input(s): WBC, HGB, HCT, PLT in the last 72 hours. No results for input(s): NA, K, CL, GLUCOSE, BUN, CREATININE, CALCIUM in the last 72 hours.  Invalid input(s): CO CBG (last 3)  No results for input(s): GLUCAP in the last 72 hours.  Wt Readings from Last 3 Encounters:  07/17/16 84.4 kg (186 lb 1.1 oz)  07/11/16 83.9 kg (185 lb)  06/12/16 80.5 kg (177 lb 6.4 oz)    Physical Exam:  Constitutional: NAD. Vital signs reviewed. HENT: Normocephalic. Atraumatic. Eyes: EOMare normal. No discharge.  Cardiovascular: RRR. No JVD. Respiratory: Clear. Unlabored. GI: Soft. Bowel sounds are normal.  Musc: +Edema decreasing at stump. Mild tenderness along suture line Neurological: He is alert and oriented.  Motor: 4+-5/5 B/l UE, RLE, LLE HF (stable).   Skin. Stump with sutures c/d/i  Assessment/Plan: 1. Functional and mobility deficits secondary to left BKA which require 3+ hours per day of interdisciplinary therapy in a comprehensive inpatient rehab setting. Physiatrist is providing close team supervision and 24 hour management of active medical problems listed below. Physiatrist and rehab team continue to assess barriers to discharge/monitor patient progress toward functional and medical goals.  Function:  Bathing Bathing position   Position: Shower  Bathing parts Body parts bathed by patient: Right arm, Left arm, Chest, Abdomen, Front perineal area, Right upper  leg, Left upper leg, Right lower leg, Left lower leg, Buttocks Body parts bathed by helper: Back  Bathing assist Assist Level: Supervision or verbal cues      Upper Body Dressing/Undressing Upper body dressing   What is the patient wearing?: Pull over shirt/dress     Pull over shirt/dress - Perfomed by patient: Thread/unthread right sleeve, Thread/unthread left sleeve, Put head through opening, Pull shirt over trunk          Upper body assist Assist Level: More than reasonable time   Set up : To obtain clothing/put away  Lower Body Dressing/Undressing Lower body dressing   What is the patient wearing?: Underwear, Pants, AFO, Non-skid slipper socks Underwear - Performed by patient: Thread/unthread right underwear leg, Thread/unthread left underwear leg, Pull underwear up/down Underwear - Performed by helper: Pull underwear up/down Pants- Performed by patient: Thread/unthread right pants leg, Thread/unthread left pants leg, Pull pants up/down Pants- Performed by helper: Pull pants up/down Non-skid slipper socks- Performed by patient: Don/doff right sock             AFO - Performed by helper: Don/doff right AFO (completed by significant partner [name])      Lower body assist Assist for lower body dressing: Touching or steadying assistance (Pt > 75%)      Toileting Toileting   Toileting steps completed by patient: Adjust clothing prior to toileting, Performs perineal hygiene, Adjust clothing after toileting Toileting steps completed by helper: Adjust clothing after toileting (assisted) Toileting Assistive Devices: Grab bar or rail  Toileting assist Assist level: Touching or steadying assistance (Pt.75%)   Transfers Chair/bed  transfer   Chair/bed transfer method: Stand pivot Chair/bed transfer assist level: Touching or steadying assistance (Pt > 75%) Chair/bed transfer assistive device: Walker, Air cabin crew Ambulation activity did not occur:  Safety/medical concerns   Max distance: 30 Assist level: Touching or steadying assistance (Pt > 75%)   Wheelchair   Type: Manual Max wheelchair distance: 150 ft Assist Level: Supervision or verbal cues  Cognition Comprehension Comprehension assist level: Understands basic 90% of the time/cues < 10% of the time  Expression Expression assist level: Expresses basic 90% of the time/requires cueing < 10% of the time.  Social Interaction Social Interaction assist level: Interacts appropriately 90% of the time - Needs monitoring or encouragement for participation or interaction.  Problem Solving Problem solving assist level: Solves basic 50 - 74% of the time/requires cueing 25 - 49% of the time  Memory Memory assist level: Recognizes or recalls 50 - 74% of the time/requires cueing 25 - 49% of the time   Medical Problem List and Plan: 1.  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  DVT Prophylaxis/Anticoagulation: Subcutaneous Lovenox. Monitor for any bleeding episode 3. Pain Management:  Good control    -Nucynta 100 mg every 6 hours, Neurontin 300 mg 3 times a day, Robaxin and oxycodone as needed 4. Mood: -will stop and observe today 5. Neuropsych: This patient is capable of making decisions on his own behalf. 6. Skin/Wound Care: -continue with shrinker sock 7. Fluids/Electrolytes/Nutrition: Routine I&Os 8. History of right proximal fibula fracture with ORIF external fixator. Weightbearing as tolerated with Cam boot 9. History of C2-3-5 fractures with extradural hematoma.   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 Hypertension/tachycardia. Lopressor 50 mg twice a day  WNL on 1/14 11. Constipation. Laxative assistance 12. ABLA  Hb 11.2 on 1/9  Cont to monitor   LOS (Days) 8 A FACE TO FACE EVALUATION WAS PERFORMED  [name], MD 07/23/2016 8:43 AM

## 2016-07-23 NOTE — Discharge Summary (Signed)
Discharge summary job [id number]

## 2016-07-23 NOTE — Progress Notes (Signed)
Physical Therapy Discharge Summary  Patient Details  Name: [name] MRN: [id number] Date of Birth: January 23, 1961  Today's Date: 07/23/2016 PT Individual Time: 0800-0855 PT Individual Time Calculation (min): 55 min    Patient has met 10 of 10 long term goals due to improved activity tolerance, improved balance, improved postural control, increased strength, decreased pain, ability to compensate for deficits and improved coordination.  Patient to discharge at a wheelchair level Supervision, and ambulatory short distances with minA.   Patient's care partner is independent to provide the necessary physical and cognitive assistance at discharge.  Reasons goals not met: All goals met  Recommendation:  Patient will benefit from ongoing skilled PT services in home health setting to continue to advance safe functional mobility, address ongoing impairments in strength, ROM, activity tolerance, balance, and minimize fall risk.  Equipment: No equipment provided  Reasons for discharge: treatment goals met and discharge from hospital  Patient/family agrees with progress made and goals achieved: Yes  PT Discharge Precautions/RestrictionsPrecautions Precautions: None Other Brace/Splint: cam boot on RLE for comfort with weight bearing Restrictions Weight Bearing Restrictions: Yes RLE Weight Bearing: Weight bearing as tolerated LLE Weight Bearing: Non weight bearing Vital Signs Therapy Vitals Temp: 98.1 F (36.7 C) Temp Source: Oral Pulse Rate: 68 Resp: 17 BP: 120/81 Patient Position (if appropriate): Lying Oxygen Therapy SpO2: 98 % O2 Device: Not Delivered Vision/Perception    WFL for functional tasks Cognition Overall Cognitive Status: History of cognitive impairments - at baseline (TBI 9/17) Arousal/Alertness: Awake/alert Orientation Level: Oriented X4 Attention: Selective Selective Attention: Impaired Selective Attention Impairment: Verbal basic;Functional basic Memory:  Impaired Memory Impairment: Decreased short term memory;Decreased recall of new information Decreased Short Term Memory: Verbal complex;Functional complex Awareness: Impaired Awareness Impairment: Emergent impairment Problem Solving: Impaired Problem Solving Impairment: Verbal complex;Functional complex Safety/Judgment: Appears intact Sensation Sensation Light Touch: Appears Intact Proprioception: Appears Intact Coordination Gross Motor Movements are Fluid and Coordinated: No Fine Motor Movements are Fluid and Coordinated: Yes Coordination and Movement Description: Impaired due to new BKA, however, much improved since admission Motor  Motor Motor: Within Functional Limits;Other (comment) Motor - Discharge Observations: Generalized deconditioning  Mobility Bed Mobility Bed Mobility: Supine to Sit;Sit to Supine Supine to Sit: 6: Modified independent (Device/Increase time) Sit to Supine: 6: Modified independent (Device/Increase time) Transfers Transfers: Yes Stand Pivot Transfers: With armrests;4: Min guard Stand Pivot Transfer Details: Verbal cues for technique;Verbal cues for precautions/safety Stand Pivot Transfer Details (indicate cue type and reason): with RW Squat Pivot Transfers: 5: Supervision Squat Pivot Transfer Details: Verbal cues for technique;Verbal cues for precautions/safety Lateral/Scoot Transfers: 5: Supervision;With slide board Lateral/Scoot Transfer Details: Verbal cues for precautions/safety Locomotion  Ambulation Ambulation: Yes Ambulation/Gait Assistance: 4: Min guard Ambulation Distance (Feet): 25 Feet Assistive device: Rolling walker Gait Gait: Yes Gait Pattern: Impaired Gait Pattern: Poor foot clearance - right Gait velocity: significantly decreased for age/gender norms Stairs / Additional Locomotion Stairs: No Wheelchair Mobility Wheelchair Mobility: Yes Wheelchair Assistance: 6: Modified independent (Device/Increase time) Surveyor, minerals: Both upper extremities Wheelchair Parts Management: Independent Distance: 200'  Trunk/Postural Assessment  Cervical Assessment Cervical Assessment: Exceptions to [facility] (Pt's hard collar d/c during CIR stay, limited cervical mobility due to prolonged time in hard collar) Thoracic Assessment Thoracic Assessment: Within Functional Limits Lumbar Assessment Lumbar Assessment: Within Functional Limits Postural Control Postural Control: Deficits on evaluation (Decreased standing balance/ control due to new BKA, requiring at least one UE support during standing)  Balance Balance Balance Assessed: Yes Dynamic Sitting Balance Dynamic Sitting -  Balance Support: No upper extremity supported;Feet supported;During functional activity Dynamic Sitting - Level of Assistance: 5: Stand by assistance Sitting balance - Comments: Reaching to floor during ADL tasks, pt fearful with high level dynamic sitting tasks, however, able to complete at stand by assist level Static Standing Balance Static Standing - Balance Support: Left upper extremity supported Static Standing - Level of Assistance: 5: Stand by assistance Dynamic Standing Balance Dynamic Standing - Balance Support: During functional activity;Left upper extremity supported Dynamic Standing - Level of Assistance: 5: Stand by assistance Dynamic Standing - Balance Activities: Reaching for weighted objects;Reaching across midline Extremity Assessment  RUE Assessment RUE Assessment: Within Functional Limits LUE Assessment LUE Assessment: Exceptions to [facility] (Limited shoulder flexion due to hx of injury; strength 4+/5 ) RLE Assessment RLE Assessment: Exceptions to [facility] (generalized weakness, 4+/5 throughout) LLE Assessment LLE Assessment: Exceptions to [facility] (-5 degrees knee extension, 100 degrees knee flexion, hip strength 4+/5 throughout, knee flexion/extension 4-/5 )  Skilled Therapeutic Intervention: Pt received seated on EOB eating  breakfast; c/o "the usual" pain in L residual limb but does not rate. While pt completed breakfast, discussed d/c plan and recommendations with pt and wife. Discussed HEP for LE strengthening/ROM, and cervical ROM, w/c setup with family preference to use ELR used as amputee pad on L side, and transfer method recommendations based on pt energy/pain levels with encouragement to be standing/ambulating as much as possible for strengthening. Pt performed all mobility as described above, with modI bed mobility, S for transfers w/c <>car with transfer board and w/c <>bed squat pivot. Performed ambulatory transfers and stand pivot transfers with min guard provided by wife with RW. Gait x25' with min guard and RW before increased pain in R knee and fatigue. Pt and wife with no further questions regarding d/c at this time. Remained seated in w/c at end of session with all needs in reach.   See Function Navigator for Current Functional Status.  Benjiman Core Tygielski 07/23/2016, 8:55 AM

## 2016-07-23 NOTE — Progress Notes (Signed)
Occupational Therapy Session Note  Patient Details  Name: [name] MRN: [id number] Date of Birth: Feb 29, 1961  Today's Date: 07/23/2016 OT Individual Time: 1100-1200 and 1430-1500 OT Individual Time Calculation (min): 60 min and 30 min    Short Term Goals:Week 1:  OT Short Term Goal 1 (Week 1): STG=LTG due to LOS  Skilled Therapeutic Interventions/Progress Updates:    Session One: Pt seen for OT ADL bathing/dressing session and for family education. Pt sitting up in w/c upon arrival with wife, [name], present and agreeable to tx session. Completed bathing from shower level in ADL apartment utilizing tub transfer bench in simulation of home environment. Completed squat pivot transfer with supervision and VCs for clearance of buttock during transfer. He bathed on tub bench with supervision. Pt unnecessarily  Asking for assistance from wife for dressing tasks, becoming increasing frustrated/ agitated when encouraged to complete tasks independently. Reviewed with wife pt's level of independence/ need for assist with ADLs, however, pt requesting/ requiring more assist today compared to previous sessions. Reviewed with pt and wife regarding options for transfer methods (stand pivot vs squat pivot vs sliding board) and complete based on pt's fatigue level. Also discussed toileting and home set-up and setting pt up for most independence with tasks. Pt left seated in w/c at end of session, all needs in reach and wife present.   Session Two: Pt seen for OT session focusing on ADL re-training and functional standing balance. Pt sitting up in w/c upon arrival, agreeable to tx session. Completed simulated BSC transfer via squat pivot with supervision and VCs for technique/ weightshift. Pt demonstrated ability to functionally complete lateral leans to pull pants up/down. He returned to w/c and donned/ doff R sock and CAM boot independently. He requested assist from wife this morning to complete this task and  reviewed with pt importance of independence with ADLs and reducing caregiver burden.  In therapy gym, completed horseshoe toss x2 trials addressing dynamic standing balance and endurance. Seated rest break provided btwn trials. Pt self propelled w/c back to room mod I.   Therapy Documentation Precautions:  Precautions Precautions: Cervical Precaution Comments: from previous cervical spine fx Required Braces or Orthoses: Cervical Brace Cervical Brace: Hard collar, At all times Other Brace/Splint: cam boot on RLE for comfort with weight bearing Restrictions Weight Bearing Restrictions: Yes RLE Weight Bearing: Weight bearing as tolerated LLE Weight Bearing: Non weight bearing Pain: Pain Assessment Pain Assessment: 0-10 Pain Score: 6  Pain Type: Acute pain Pain Location: Leg Pain Orientation: Right;Left;Upper Pain Descriptors / Indicators: Aching Pain Frequency: Intermittent Pain Onset: With Activity Pain Intervention(s): Medication (See eMAR), RN aware, shower  See Function Navigator for Current Functional Status.   Therapy/Group: Individual Therapy  Lewis, Kaneisha Ellenberger C 07/23/2016, 7:14 AM

## 2016-07-23 NOTE — Progress Notes (Signed)
Physical Therapy Session Note  Patient Details  Name: [name] MRN: [id number] Date of Birth: [birth year]  Today's Date: 07/23/2016 PT Individual Time: [id number] PT Individual Time Calculation (min): 41 min    Short Term Goals: Week 1:  PT Short Term Goal 1 (Week 1): =LTG due to estimated LOS  Skilled Therapeutic Interventions/Progress Updates:    Pt received in w/c & agreeable to tx. Pt noted 6/10 pain & fatigue in RLE follow earlier therapy session. Session focused on w/c mobility, transfers, ambulation, and stretching. Pt propelled w/c room<>gym with BUE for endurance training. Pt able to transfer w/c<>mat table via lateral scoot with close supervision. Pt able to manage w/c parts with supervision, requiring only occasional cuing to manage armrest after returning to w/c. Pt attempted to lie prone on mat table but with discomfort during attempt 2/2 poor cervical ROM. Pt performed supine hip flexor stretch, noting slight pain with achieving neutral L hip alignment. In hallway, pt then ambulated over uneven surface with RW & min assist with cuing for RW placement and transitioning from even to uneven surface. At end of session pt left in w/c in room with all needs within reach.   Therapy Documentation Precautions:  Precautions Precautions: None Precaution Comments: from previous cervical spine fx Required Braces or Orthoses: Cervical Brace Cervical Brace: Hard collar, At all times Other Brace/Splint: cam boot on RLE for comfort with weight bearing Restrictions Weight Bearing Restrictions: Yes RLE Weight Bearing: Weight bearing as tolerated LLE Weight Bearing: Non weight bearing   See Function Navigator for Current Functional Status.   Therapy/Group: Individual Therapy  [name] 07/23/2016, 12:49 PM

## 2016-07-23 NOTE — Progress Notes (Signed)
No s/s of depression noted at this time.  Pt calm, uses call bell when needing anything.  No behaviors noted at this time.  Hand out provided to pt and spouse to inform of depression.  Encouraged to discuss with PCP if any symptoms noted.  Continue plan of care at this time.

## 2016-07-24 MED ORDER — OXYCODONE HCL 10 MG PO TABS: 10.0000 mg | ORAL_TABLET | ORAL | 0 refills | Status: DC | PRN

## 2016-07-24 MED ORDER — GABAPENTIN 300 MG PO CAPS: 300.0000 mg | ORAL_CAPSULE | Freq: Three times a day (TID) | ORAL | 0 refills | Status: DC

## 2016-07-24 MED ORDER — METOPROLOL TARTRATE 50 MG PO TABS: 50.0000 mg | ORAL_TABLET | Freq: Two times a day (BID) | ORAL | 1 refills | Status: DC

## 2016-07-24 MED ORDER — TAPENTADOL HCL 100 MG PO TABS: 100.0000 mg | ORAL_TABLET | Freq: Four times a day (QID) | ORAL | 0 refills | Status: DC

## 2016-07-24 MED ORDER — ESOMEPRAZOLE MAGNESIUM 40 MG PO CPDR: 40.0000 mg | DELAYED_RELEASE_CAPSULE | Freq: Every day | ORAL | 4 refills | Status: DC

## 2016-07-24 MED ORDER — METHOCARBAMOL 500 MG PO TABS: 500.0000 mg | ORAL_TABLET | Freq: Four times a day (QID) | ORAL | 0 refills | Status: DC | PRN
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
## 2016-07-24 NOTE — Progress Notes (Signed)
Pt prepared for discharge. PT given discharge instructions. Awaiting ride home
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
## 2016-07-25 NOTE — Progress Notes (Signed)
Social Work  Discharge Note  The overall goal for the admission was met for:   Discharge location: Yes - home with wife and family providing 24/7 assistance.  Length of Stay: Yes -  days  Discharge activity level: Yes  Home/community participation: Yes - supervision/ min assist  Services provided included: MD, RD, PT, OT, SLP, RN, TR, Pharmacy and SW  Financial Services: Private Insurance: generic commercial  Follow-up services arranged: Home Health: RN, PT, OT, ST via [place];  pt/ wife requested [facility] as they had followed pt after prior [facility] stay  Comments (or additional information):  Patient/Family verbalized understanding of follow-up arrangements: Yes  Individual responsible for coordination of the follow-up plan: pt  Confirmed correct DME delivered: NA had all needed DME  Shakila Mak

## 2016-07-28 ENCOUNTER — Emergency Department (HOSPITAL_COMMUNITY)
Admission: EM | Admit: 2016-07-28 | Discharge: 2016-07-28 | Disposition: A | Discharge status: Home / Self Care | Payer: PRIVATE HEALTH INSURANCE | Attending: Emergency Medicine | Admitting: Emergency Medicine

## 2016-07-28 ENCOUNTER — Encounter (HOSPITAL_COMMUNITY): Payer: Self-pay | Admitting: Emergency Medicine

## 2016-07-28 DIAGNOSIS — I10 Essential (primary) hypertension: Secondary | ICD-10-CM | POA: Diagnosis not present

## 2016-07-28 DIAGNOSIS — Z7982 Long term (current) use of aspirin: Secondary | ICD-10-CM | POA: Diagnosis not present

## 2016-07-28 DIAGNOSIS — E119 Type 2 diabetes mellitus without complications: Secondary | ICD-10-CM | POA: Insufficient documentation

## 2016-07-28 DIAGNOSIS — Z89512 Acquired absence of left leg below knee: Secondary | ICD-10-CM | POA: Insufficient documentation

## 2016-07-28 DIAGNOSIS — Z87891 Personal history of nicotine dependence: Secondary | ICD-10-CM | POA: Insufficient documentation

## 2016-07-28 DIAGNOSIS — J449 Chronic obstructive pulmonary disease, unspecified: Secondary | ICD-10-CM | POA: Insufficient documentation

## 2016-07-28 DIAGNOSIS — R1013 Epigastric pain: Secondary | ICD-10-CM | POA: Insufficient documentation

## 2016-07-28 HISTORY — DX: Rider (driver) (passenger) of other motorcycle injured in unspecified traffic accident, initial encounter: V29.99XA

## 2016-07-28 LAB — COMPREHENSIVE METABOLIC PANEL
ALT: 16 U/L — ABNORMAL LOW (ref 17–63)
AST: 19 U/L (ref 15–41)
Albumin: 3.7 g/dL (ref 3.5–5.0)
Alkaline Phosphatase: 90 U/L (ref 38–126)
Anion gap: 12 (ref 5–15)
BUN: 19 mg/dL (ref 6–20)
CO2: 24 mmol/L (ref 22–32)
Calcium: 9.7 mg/dL (ref 8.9–10.3)
Chloride: 102 mmol/L (ref 101–111)
Creatinine, Ser: 0.59 mg/dL — ABNORMAL LOW (ref 0.61–1.24)
GFR calc Af Amer: >60 mL/min (ref >60)
GFR calc non Af Amer: >60 mL/min (ref >60)
Glucose, Bld: 146 mg/dL — ABNORMAL HIGH (ref 65–99)
Potassium: 4.1 mmol/L (ref 3.5–5.1)
Sodium: 138 mmol/L (ref 135–145)
Total Bilirubin: 0.1 mg/dL — ABNORMAL LOW (ref 0.3–1.2)
Total Protein: 6.8 g/dL (ref 6.5–8.1)

## 2016-07-28 LAB — CBC
HCT: 38.5 % — ABNORMAL LOW (ref 39.0–52.0)
Hemoglobin: 12 g/dL — ABNORMAL LOW (ref 13.0–17.0)
MCH: 26.4 pg (ref 26.0–34.0)
MCHC: 31.2 g/dL (ref 30.0–36.0)
MCV: 84.8 fL (ref 78.0–100.0)
Platelets: 287 10*3/uL (ref 150–400)
RBC: 4.54 MIL/uL (ref 4.22–5.81)
RDW: 16.3 % — ABNORMAL HIGH (ref 11.5–15.5)
WBC: 11.9 10*3/uL — ABNORMAL HIGH (ref 4.0–10.5)

## 2016-07-28 LAB — URINALYSIS, ROUTINE W REFLEX MICROSCOPIC
Bilirubin Urine: NEGATIVE
Glucose, UA: NEGATIVE mg/dL
Hgb urine dipstick: NEGATIVE
Ketones, ur: NEGATIVE mg/dL
Leukocytes, UA: NEGATIVE
Nitrite: NEGATIVE
Protein, ur: NEGATIVE mg/dL
Specific Gravity, Urine: 1.023 (ref 1.005–1.030)
pH: 5 (ref 5.0–8.0)

## 2016-07-28 LAB — LIPASE, BLOOD: Lipase: 39 U/L (ref 11–51)

## 2016-07-28 NOTE — ED Notes (Signed)
Patient Alert and oriented X4. Stable and ambulatory. Patient verbalized understanding of the discharge instructions.  Patient belongings were taken by the patient.  

## 2016-07-28 NOTE — Discharge Instructions (Signed)
There were no significant abnormalities on the lab work today. Follow-up with your primary care provider as soon as possible for any further management. Return to the ED as needed.

## 2016-07-28 NOTE — ED Triage Notes (Signed)
Patient reports mid abdominal pain onset this evening , denies emesis or diarrhea , no fever or chills . Denies pain at arrival .
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
## 2016-08-12 ENCOUNTER — Encounter
Payer: PRIVATE HEALTH INSURANCE | Attending: Physical Medicine & Rehabilitation | Admitting: Physical Medicine & Rehabilitation

## 2016-08-12 ENCOUNTER — Encounter: Payer: Self-pay | Admitting: Physical Medicine & Rehabilitation

## 2016-08-12 VITALS — BP 132/76 | HR 78

## 2016-08-12 DIAGNOSIS — K219 Gastro-esophageal reflux disease without esophagitis: Secondary | ICD-10-CM | POA: Insufficient documentation

## 2016-08-12 DIAGNOSIS — E559 Vitamin D deficiency, unspecified: Secondary | ICD-10-CM | POA: Diagnosis not present

## 2016-08-12 DIAGNOSIS — Z833 Family history of diabetes mellitus: Secondary | ICD-10-CM | POA: Diagnosis not present

## 2016-08-12 DIAGNOSIS — S069X3S Unspecified intracranial injury with loss of consciousness of 1 hour to 5 hours 59 minutes, sequela: Secondary | ICD-10-CM | POA: Diagnosis not present

## 2016-08-12 DIAGNOSIS — Z89512 Acquired absence of left leg below knee: Secondary | ICD-10-CM | POA: Insufficient documentation

## 2016-08-12 DIAGNOSIS — S143XXS Injury of brachial plexus, sequela: Secondary | ICD-10-CM

## 2016-08-12 DIAGNOSIS — E291 Testicular hypofunction: Secondary | ICD-10-CM | POA: Diagnosis not present

## 2016-08-12 DIAGNOSIS — Z87891 Personal history of nicotine dependence: Secondary | ICD-10-CM | POA: Diagnosis not present

## 2016-08-12 DIAGNOSIS — E785 Hyperlipidemia, unspecified: Secondary | ICD-10-CM | POA: Diagnosis not present

## 2016-08-12 DIAGNOSIS — R7303 Prediabetes: Secondary | ICD-10-CM | POA: Diagnosis not present

## 2016-08-12 DIAGNOSIS — J449 Chronic obstructive pulmonary disease, unspecified: Secondary | ICD-10-CM | POA: Insufficient documentation

## 2016-08-12 DIAGNOSIS — Z8739 Personal history of other diseases of the musculoskeletal system and connective tissue: Secondary | ICD-10-CM | POA: Diagnosis not present

## 2016-08-12 DIAGNOSIS — Z8249 Family history of ischemic heart disease and other diseases of the circulatory system: Secondary | ICD-10-CM | POA: Diagnosis not present

## 2016-08-12 DIAGNOSIS — G546 Phantom limb syndrome with pain: Secondary | ICD-10-CM | POA: Insufficient documentation

## 2016-08-12 DIAGNOSIS — I10 Essential (primary) hypertension: Secondary | ICD-10-CM | POA: Insufficient documentation

## 2016-08-12 DIAGNOSIS — Z87820 Personal history of traumatic brain injury: Secondary | ICD-10-CM | POA: Insufficient documentation

## 2016-08-12 MED ORDER — GABAPENTIN 300 MG PO CAPS: 300.0000 mg | ORAL_CAPSULE | Freq: Three times a day (TID) | ORAL | 4 refills | Status: DC

## 2016-08-12 MED ORDER — METOPROLOL TARTRATE 50 MG PO TABS: 50.0000 mg | ORAL_TABLET | Freq: Two times a day (BID) | ORAL | 5 refills | Status: DC
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
## 2016-08-12 NOTE — Patient Instructions (Signed)
PLEASE FEEL FREE TO CALL OUR OFFICE WITH ANY PROBLEMS OR QUESTIONS ([phone])      

## 2016-09-20 ENCOUNTER — Encounter: Payer: Self-pay | Admitting: Internal Medicine

## 2016-09-20 ENCOUNTER — Ambulatory Visit: Payer: Self-pay | Admitting: Physician Assistant

## 2016-09-20 ENCOUNTER — Ambulatory Visit (INDEPENDENT_AMBULATORY_CARE_PROVIDER_SITE_OTHER): Payer: PRIVATE HEALTH INSURANCE | Admitting: Internal Medicine

## 2016-09-20 VITALS — BP 110/70 | HR 70 | Temp 97.8°F | Resp 16

## 2016-09-20 DIAGNOSIS — I10 Essential (primary) hypertension: Secondary | ICD-10-CM

## 2016-09-20 DIAGNOSIS — Z89512 Acquired absence of left leg below knee: Secondary | ICD-10-CM

## 2016-09-20 DIAGNOSIS — S143XXS Injury of brachial plexus, sequela: Secondary | ICD-10-CM

## 2016-09-20 DIAGNOSIS — Z79899 Other long term (current) drug therapy: Secondary | ICD-10-CM | POA: Diagnosis not present

## 2016-09-20 DIAGNOSIS — E559 Vitamin D deficiency, unspecified: Secondary | ICD-10-CM

## 2016-09-20 DIAGNOSIS — R7303 Prediabetes: Secondary | ICD-10-CM

## 2016-09-20 DIAGNOSIS — F321 Major depressive disorder, single episode, moderate: Secondary | ICD-10-CM | POA: Diagnosis not present

## 2016-09-20 DIAGNOSIS — S069X3S Unspecified intracranial injury with loss of consciousness of 1 hour to 5 hours 59 minutes, sequela: Secondary | ICD-10-CM

## 2016-09-20 DIAGNOSIS — E782 Mixed hyperlipidemia: Secondary | ICD-10-CM | POA: Diagnosis not present

## 2016-09-20 DIAGNOSIS — G546 Phantom limb syndrome with pain: Secondary | ICD-10-CM | POA: Diagnosis not present

## 2016-09-20 LAB — CBC WITH DIFFERENTIAL/PLATELET
Basophils Absolute: 83 cells/uL (ref 0–200)
Basophils Relative: 1 %
Eosinophils Absolute: 166 cells/uL (ref 15–500)
Eosinophils Relative: 2 %
HCT: 43.7 % (ref 38.5–50.0)
Hemoglobin: 14.7 g/dL (ref 13.2–17.1)
Lymphocytes Relative: 27 %
Lymphs Abs: 2241 cells/uL (ref 850–3900)
MCH: 28.2 pg (ref 27.0–33.0)
MCHC: 33.6 g/dL (ref 32.0–36.0)
MCV: 83.7 fL (ref 80.0–100.0)
MPV: 10.5 fL (ref 7.5–12.5)
Monocytes Absolute: 498 cells/uL (ref 200–950)
Monocytes Relative: 6 %
Neutro Abs: 5312 cells/uL (ref 1500–7800)
Neutrophils Relative %: 64 %
Platelets: 211 10*3/uL (ref 140–400)
RBC: 5.22 MIL/uL (ref 4.20–5.80)
RDW: 17 % — ABNORMAL HIGH (ref 11.0–15.0)
WBC: 8.3 10*3/uL (ref 3.8–10.8)

## 2016-09-20 LAB — COMPREHENSIVE METABOLIC PANEL
ALT: 24 U/L (ref 9–46)
AST: 19 U/L (ref 10–35)
Albumin: 4.2 g/dL (ref 3.6–5.1)
Alkaline Phosphatase: 89 U/L (ref 40–115)
BUN: 14 mg/dL (ref 7–25)
CO2: 25 mmol/L (ref 20–31)
Calcium: 9.8 mg/dL (ref 8.6–10.3)
Chloride: 101 mmol/L (ref 98–110)
Creat: 0.46 mg/dL — ABNORMAL LOW (ref 0.70–1.33)
Glucose, Bld: 84 mg/dL (ref 65–99)
Potassium: 4.6 mmol/L (ref 3.5–5.3)
Sodium: 138 mmol/L (ref 135–146)
Total Bilirubin: 0.5 mg/dL (ref 0.2–1.2)
Total Protein: 7.1 g/dL (ref 6.1–8.1)

## 2016-09-20 MED ORDER — VENLAFAXINE HCL ER 37.5 MG PO CP24: 37.5000 mg | ORAL_CAPSULE | Freq: Every day | ORAL | 2 refills | Status: DC

## 2016-09-20 MED ORDER — GABAPENTIN 300 MG PO CAPS
300.0000 mg | ORAL_CAPSULE | Freq: Three times a day (TID) | ORAL | 4 refills | Status: DC
Start: 2016-09-20 — End: 2016-09-23

## 2016-09-20 MED ORDER — METOPROLOL TARTRATE 50 MG PO TABS: 50.0000 mg | ORAL_TABLET | Freq: Two times a day (BID) | ORAL | 5 refills | Status: DC

## 2016-09-20 NOTE — Patient Instructions (Signed)
Venlafaxine extended-release capsules  What is this medicine?  VENLAFAXINE(VEN la fax een) is used to treat depression, anxiety and panic disorder.  This medicine may be used for other purposes; ask your health care provider or pharmacist if you have questions.  COMMON BRAND NAME(S): Effexor XR  What should I tell my health care provider before I take this medicine?  They need to know if you have any of these conditions:  -bleeding disorders  -glaucoma  -heart disease  -high blood pressure  -high cholesterol  -kidney disease  -liver disease  -low levels of sodium in the blood  -mania or bipolar disorder  -seizures  -suicidal thoughts, plans, or attempt; a previous suicide attempt by you or a family  -take medicines that treat or prevent blood clots  -thyroid disease  -an unusual or allergic reaction to venlafaxine, desvenlafaxine, other medicines, foods, dyes, or preservatives  -pregnant or trying to get pregnant  -breast-feeding  How should I use this medicine?  Take this medicine by mouth with a full glass of water. Follow the directions on the prescription label. Do not cut, crush, or chew this medicine. Take it with food. If needed, the capsule may be carefully opened and the entire contents sprinkled on a spoonful of cool applesauce. Swallow the applesauce/pellet mixture right away without chewing and follow with a glass of water to ensure complete swallowing of the pellets. Try to take your medicine at about the same time each day. Do not take your medicine more often than directed. Do not stop taking this medicine suddenly except upon the advice of your doctor. Stopping this medicine too quickly may cause serious side effects or your condition may worsen.  A special MedGuide will be given to you by the pharmacist with each prescription and refill. Be sure to read this information carefully each time.  Talk to your pediatrician regarding the use of this medicine in children. Special care may be  needed.  Overdosage: If you think you have taken too much of this medicine contact a poison control center or emergency room at once.  NOTE: This medicine is only for you. Do not share this medicine with others.  What if I miss a dose?  If you miss a dose, take it as soon as you can. If it is almost time for your next dose, take only that dose. Do not take double or extra doses.  What may interact with this medicine?  Do not take this medicine with any of the following medications:  -certain medicines for fungal infections like fluconazole, itraconazole, ketoconazole, posaconazole, voriconazole  -cisapride  -desvenlafaxine  -dofetilide  -dronedarone  -duloxetine  -levomilnacipran  -linezolid  -MAOIs like Carbex, Eldepryl, Marplan, Nardil, and Parnate  -methylene blue (injected into a vein)  -milnacipran  -pimozide  -thioridazine  -ziprasidone  This medicine may also interact with the following medications:  -amphetamines  -aspirin and aspirin-like medicines  -certain medicines for depression, anxiety, or psychotic disturbances  -certain medicines for migraine headaches like almotriptan, eletriptan, frovatriptan, naratriptan, rizatriptan, sumatriptan, zolmitriptan  -certain medicines for sleep  -certain medicines that treat or prevent blood clots like dalteparin, enoxaparin, warfarin  -cimetidine  -clozapine  -diuretics  -fentanyl  -furazolidone  -indinavir  -isoniazid  -lithium  -metoprolol  -NSAIDS, medicines for pain and inflammation, like ibuprofen or naproxen  -other medicines that prolong the QT interval (cause an abnormal heart rhythm)  -procarbazine  -rasagiline  -supplements like St. John's wort, kava kava, valerian  -tramadol  -tryptophan    worse. Visit your doctor or health care professional for regular checks on your progress. Because it may take several weeks to see the full effects of this medicine, it is important to continue your treatment as prescribed by your doctor. Patients and their families should watch out for new or worsening thoughts of suicide or depression. Also watch out for sudden changes in feelings such as feeling anxious, agitated, panicky, irritable, hostile, aggressive, impulsive, severely restless, overly excited and hyperactive, or not being able to sleep. If this happens, especially at the beginning of treatment or after a change in dose, call your health care professional. This medicine can cause an increase in blood pressure. Check with your doctor for instructions on monitoring your blood pressure while taking this medicine. You may get drowsy or dizzy. Do not drive, use machinery, or do anything that needs mental alertness until you know how this medicine affects you. Do not stand or sit up quickly, especially if you are an older patient. This reduces the risk of dizzy or fainting spells. Alcohol may interfere with the effect of this medicine. Avoid alcoholic drinks. Your mouth may get dry. Chewing sugarless gum, sucking hard candy and drinking plenty of water will help. Contact your doctor if the problem does not go away or is severe. What side effects may I notice from receiving this medicine? Side effects that you should report to your doctor or health care professional as soon as possible: -allergic reactions like skin rash, itching or hives, swelling of the face, lips, or tongue -anxious -breathing problems -confusion -changes in vision -chest pain -confusion -elevated mood, decreased need for sleep, racing thoughts, impulsive behavior -eye pain -fast, irregular  heartbeat -feeling faint or lightheaded, falls -feeling agitated, angry, or irritable -hallucination, loss of contact with reality -high blood pressure -loss of balance or coordination -palpitations -redness, blistering, peeling or loosening of the skin, including inside the mouth -restlessness, pacing, inability to keep still -seizures -stiff muscles -suicidal thoughts or other mood changes -trouble passing urine or change in the amount of urine -trouble sleeping -unusual bleeding or bruising -unusually weak or tired -vomiting Side effects that usually do not require medical attention (report to your doctor or health care professional if they continue or are bothersome): -change in sex drive or performance -change in appetite or weight -constipation -dizziness -dry mouth -headache -increased sweating -nausea -tired This list may not describe all possible side effects. Call your doctor for medical advice about side effects. You may report side effects to FDA at 1-800-FDA-1088. Where should I keep my medicine? Keep out of the reach of children. Store at a controlled temperature between 20 and 25 degrees C (68 degrees and 77 degrees F), in a dry place. Throw away any unused medicine after the expiration date. NOTE: This sheet is a summary. It may not cover all possible information. If you have questions about this medicine, talk to your doctor, pharmacist, or health care provider.  2018 [employer]vier/Gold Standard (2015-11-23 18:38:02)  
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
## 2016-09-23 ENCOUNTER — Other Ambulatory Visit: Payer: Self-pay | Admitting: *Deleted

## 2016-09-23 DIAGNOSIS — G546 Phantom limb syndrome with pain: Secondary | ICD-10-CM

## 2016-09-23 DIAGNOSIS — S069X3S Unspecified intracranial injury with loss of consciousness of 1 hour to 5 hours 59 minutes, sequela: Secondary | ICD-10-CM

## 2016-09-23 DIAGNOSIS — Z89512 Acquired absence of left leg below knee: Secondary | ICD-10-CM

## 2016-09-23 DIAGNOSIS — S143XXS Injury of brachial plexus, sequela: Secondary | ICD-10-CM

## 2016-09-23 MED ORDER — GABAPENTIN 300 MG PO CAPS: 300.0000 mg | ORAL_CAPSULE | Freq: Three times a day (TID) | ORAL | 3 refills | Status: DC

## 2016-09-26 ENCOUNTER — Encounter: Payer: Self-pay | Admitting: Physical Therapy

## 2016-09-26 ENCOUNTER — Ambulatory Visit: Payer: PRIVATE HEALTH INSURANCE | Attending: Orthopedic Surgery | Admitting: Physical Therapy

## 2016-09-26 DIAGNOSIS — R2681 Unsteadiness on feet: Secondary | ICD-10-CM | POA: Insufficient documentation

## 2016-09-26 DIAGNOSIS — R2689 Other abnormalities of gait and mobility: Secondary | ICD-10-CM | POA: Insufficient documentation

## 2016-09-26 DIAGNOSIS — M6249 Contracture of muscle, multiple sites: Secondary | ICD-10-CM | POA: Insufficient documentation

## 2016-09-26 DIAGNOSIS — M25612 Stiffness of left shoulder, not elsewhere classified: Secondary | ICD-10-CM | POA: Insufficient documentation

## 2016-09-26 DIAGNOSIS — M6281 Muscle weakness (generalized): Secondary | ICD-10-CM | POA: Diagnosis present

## 2016-09-26 DIAGNOSIS — R278 Other lack of coordination: Secondary | ICD-10-CM | POA: Insufficient documentation

## 2016-09-27 ENCOUNTER — Ambulatory Visit: Payer: PRIVATE HEALTH INSURANCE | Admitting: Physical Therapy

## 2016-09-27 ENCOUNTER — Encounter: Payer: Self-pay | Admitting: Physical Therapy

## 2016-09-27 DIAGNOSIS — M6249 Contracture of muscle, multiple sites: Secondary | ICD-10-CM

## 2016-09-27 DIAGNOSIS — M6281 Muscle weakness (generalized): Secondary | ICD-10-CM | POA: Diagnosis not present

## 2016-09-27 DIAGNOSIS — M25612 Stiffness of left shoulder, not elsewhere classified: Secondary | ICD-10-CM

## 2016-09-27 DIAGNOSIS — R2681 Unsteadiness on feet: Secondary | ICD-10-CM

## 2016-09-27 NOTE — Patient Instructions (Addendum)
AROM: Lateral Neck Flexion    Slowly tilt head toward one shoulder. Hold position __20__ seconds. Repeat to other side. Repeat _3_ times to each side. Do __1__ sets per session. Do _1-2_ sessions per day.  http://orth.exer.us/296   Copyright  VHI. All rights reserved.   AROM: Neck Rotation    Turn head slowly to look over one shoulder. Hold position _20_ seconds. Repeat to other side. Repeat _3_ times toward each side.  Do __1-2__ sessions per day.  http://orth.exer.us/294   Copyright  VHI. All rights reserved.    Chair Sitting    Sit at edge of seat, spine straight, one leg extended. Put a hand on each thigh and bend forward from the hip, keeping spine straight. Allow hand on extended leg to reach toward toes. Support upper body with other arm. Hold __20_ seconds. Repeat _3__ times each leg per session. Do _1-2__ sessions per day.  Copyright  VHI. All rights reserved.    TRUNK: Rotation    Sit at edge of sitting surface with upright posture. Turn at waist to look over shoulder. Hold for 20 seconds. Repeat for 3 reps toward each side. 1-2 times a day.   Copyright  VHI. All rights reserved.    Backward Bend (Standing)    Seated: Arch backward to make hollow of back deeper. Hold __5__ seconds. Repeat __10__ times per set. Do _1__ sets per session. Do _1-2_ sessions per day.  http://orth.exer.us/178   Copyright  VHI. All rights reserved.
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
## 2016-09-30 ENCOUNTER — Encounter: Payer: Self-pay | Admitting: Physical Therapy

## 2016-10-03 ENCOUNTER — Ambulatory Visit: Payer: PRIVATE HEALTH INSURANCE | Admitting: Physical Therapy

## 2016-10-03 ENCOUNTER — Encounter: Payer: Self-pay | Admitting: Physical Therapy

## 2016-10-03 DIAGNOSIS — R2689 Other abnormalities of gait and mobility: Secondary | ICD-10-CM

## 2016-10-03 DIAGNOSIS — M25612 Stiffness of left shoulder, not elsewhere classified: Secondary | ICD-10-CM

## 2016-10-03 DIAGNOSIS — M6281 Muscle weakness (generalized): Secondary | ICD-10-CM

## 2016-10-03 DIAGNOSIS — R2681 Unsteadiness on feet: Secondary | ICD-10-CM

## 2016-10-03 DIAGNOSIS — M6249 Contracture of muscle, multiple sites: Secondary | ICD-10-CM

## 2016-10-03 NOTE — Patient Instructions (Signed)
Do each exercise 2  times per day Do each exercise 1-2 repetitions Hold each exercise for 5 seconds to feel your location  AT [place].  USE TAPE ON FLOOR TO MARK THE MIDLINE POSITION. You also should try to feel with your limb pressure in socket.  You are trying to feel with limb what you used to feel with the bottom of your foot.  1. Side to Side Shift: Moving your hips only (not shoulders): move weight onto your left leg, HOLD/FEEL.  Move back to equal weight on each leg, HOLD/FEEL. Move weight onto your right leg, HOLD/FEEL. Move back to equal weight on each leg, HOLD/FEEL. Repeat. 2. Front to Back Shift: Moving your hips only (not shoulders): move your weight forward onto your toes, HOLD/FEEL. Move your weight back to equal Flat Foot on both legs, HOLD/FEEL. Move your weight back onto your heels, HOLD/FEEL. Move your weight back to equal on both legs, HOLD/FEEL. Repeat. 3. Moving Cones / Cups: With equal weight on each leg: Hold on with one hand the first time, then progress to no hand supports. Move cups from one side of sink to the other. Place cups ~2" out of your reach, progress to 10" beyond reach. 4. Overhead/Upward Reaching: alternated reaching up to top cabinets or ceiling if no cabinets present. Keep equal weight on each leg. Start with one hand support on counter while other hand reaches and progress to no hand support with reaching. 5.   Looking Over Shoulders: With equal weight on each leg: alternate turning to look over your shoulders with one hand support on counter as needed. Shift weight to side looking, pull hip then shoulder then head/eyes around to look behind you. Start with one hand support & progress to no hand support.
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
## 2016-10-07 ENCOUNTER — Encounter: Payer: Self-pay | Admitting: Physical Therapy

## 2016-10-07 ENCOUNTER — Ambulatory Visit: Payer: PRIVATE HEALTH INSURANCE | Attending: Orthopedic Surgery | Admitting: Physical Therapy

## 2016-10-07 DIAGNOSIS — R278 Other lack of coordination: Secondary | ICD-10-CM | POA: Diagnosis present

## 2016-10-07 DIAGNOSIS — M6281 Muscle weakness (generalized): Secondary | ICD-10-CM | POA: Insufficient documentation

## 2016-10-07 DIAGNOSIS — M6249 Contracture of muscle, multiple sites: Secondary | ICD-10-CM | POA: Diagnosis present

## 2016-10-07 DIAGNOSIS — Z89512 Acquired absence of left leg below knee: Secondary | ICD-10-CM | POA: Insufficient documentation

## 2016-10-07 DIAGNOSIS — R2689 Other abnormalities of gait and mobility: Secondary | ICD-10-CM | POA: Insufficient documentation

## 2016-10-07 DIAGNOSIS — R2681 Unsteadiness on feet: Secondary | ICD-10-CM | POA: Insufficient documentation

## 2016-10-07 DIAGNOSIS — M25612 Stiffness of left shoulder, not elsewhere classified: Secondary | ICD-10-CM | POA: Diagnosis present
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
## 2016-10-11 ENCOUNTER — Ambulatory Visit: Payer: PRIVATE HEALTH INSURANCE | Admitting: Physical Therapy

## 2016-10-11 ENCOUNTER — Encounter: Payer: Self-pay | Admitting: Physical Therapy

## 2016-10-11 DIAGNOSIS — R2689 Other abnormalities of gait and mobility: Secondary | ICD-10-CM

## 2016-10-11 DIAGNOSIS — M6281 Muscle weakness (generalized): Secondary | ICD-10-CM | POA: Diagnosis not present

## 2016-10-11 DIAGNOSIS — R2681 Unsteadiness on feet: Secondary | ICD-10-CM

## 2016-10-11 DIAGNOSIS — M6249 Contracture of muscle, multiple sites: Secondary | ICD-10-CM
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
## 2016-10-14 ENCOUNTER — Encounter: Payer: Self-pay | Admitting: *Deleted

## 2016-10-14 ENCOUNTER — Encounter: Payer: Self-pay | Admitting: Physical Therapy

## 2016-10-14 ENCOUNTER — Ambulatory Visit: Payer: PRIVATE HEALTH INSURANCE | Admitting: Physical Therapy

## 2016-10-14 DIAGNOSIS — M6281 Muscle weakness (generalized): Secondary | ICD-10-CM | POA: Diagnosis not present

## 2016-10-14 DIAGNOSIS — R2681 Unsteadiness on feet: Secondary | ICD-10-CM

## 2016-10-14 DIAGNOSIS — R2689 Other abnormalities of gait and mobility: Secondary | ICD-10-CM

## 2016-10-14 DIAGNOSIS — M6249 Contracture of muscle, multiple sites: Secondary | ICD-10-CM
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
## 2016-10-16 ENCOUNTER — Ambulatory Visit: Payer: PRIVATE HEALTH INSURANCE | Admitting: Physical Therapy

## 2016-10-16 ENCOUNTER — Encounter: Payer: Self-pay | Admitting: Physical Therapy

## 2016-10-16 ENCOUNTER — Encounter
Payer: PRIVATE HEALTH INSURANCE | Attending: Physical Medicine & Rehabilitation | Admitting: Physical Medicine & Rehabilitation

## 2016-10-16 ENCOUNTER — Encounter: Payer: Self-pay | Admitting: Physical Medicine & Rehabilitation

## 2016-10-16 VITALS — BP 139/86 | HR 68 | Resp 14

## 2016-10-16 DIAGNOSIS — K219 Gastro-esophageal reflux disease without esophagitis: Secondary | ICD-10-CM | POA: Diagnosis not present

## 2016-10-16 DIAGNOSIS — S143XXS Injury of brachial plexus, sequela: Secondary | ICD-10-CM | POA: Diagnosis not present

## 2016-10-16 DIAGNOSIS — M6281 Muscle weakness (generalized): Secondary | ICD-10-CM

## 2016-10-16 DIAGNOSIS — G546 Phantom limb syndrome with pain: Secondary | ICD-10-CM | POA: Diagnosis present

## 2016-10-16 DIAGNOSIS — I10 Essential (primary) hypertension: Secondary | ICD-10-CM | POA: Insufficient documentation

## 2016-10-16 DIAGNOSIS — Z87891 Personal history of nicotine dependence: Secondary | ICD-10-CM | POA: Diagnosis not present

## 2016-10-16 DIAGNOSIS — Z87820 Personal history of traumatic brain injury: Secondary | ICD-10-CM | POA: Insufficient documentation

## 2016-10-16 DIAGNOSIS — R7303 Prediabetes: Secondary | ICD-10-CM | POA: Diagnosis not present

## 2016-10-16 DIAGNOSIS — Z89512 Acquired absence of left leg below knee: Secondary | ICD-10-CM | POA: Diagnosis not present

## 2016-10-16 DIAGNOSIS — M6249 Contracture of muscle, multiple sites: Secondary | ICD-10-CM

## 2016-10-16 DIAGNOSIS — J449 Chronic obstructive pulmonary disease, unspecified: Secondary | ICD-10-CM | POA: Diagnosis not present

## 2016-10-16 DIAGNOSIS — Z8249 Family history of ischemic heart disease and other diseases of the circulatory system: Secondary | ICD-10-CM | POA: Insufficient documentation

## 2016-10-16 DIAGNOSIS — Z8739 Personal history of other diseases of the musculoskeletal system and connective tissue: Secondary | ICD-10-CM | POA: Insufficient documentation

## 2016-10-16 DIAGNOSIS — Z833 Family history of diabetes mellitus: Secondary | ICD-10-CM | POA: Insufficient documentation

## 2016-10-16 DIAGNOSIS — R2689 Other abnormalities of gait and mobility: Secondary | ICD-10-CM

## 2016-10-16 DIAGNOSIS — E291 Testicular hypofunction: Secondary | ICD-10-CM | POA: Diagnosis not present

## 2016-10-16 DIAGNOSIS — E785 Hyperlipidemia, unspecified: Secondary | ICD-10-CM | POA: Insufficient documentation

## 2016-10-16 DIAGNOSIS — R2681 Unsteadiness on feet: Secondary | ICD-10-CM

## 2016-10-16 DIAGNOSIS — E559 Vitamin D deficiency, unspecified: Secondary | ICD-10-CM | POA: Diagnosis not present

## 2016-10-16 NOTE — Patient Instructions (Addendum)
PLEASE FEEL FREE TO CALL OUR OFFICE WITH ANY PROBLEMS OR QUESTIONS ([phone])    IN A MONTH OR TWO, IF PHANTOM PAIN IS STILL MINIMAL--YOU CAN DECREASE GABAPENTIN TO 300MG  AT NIGHT FOR  TWO WEEKS THEN STOP
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
## 2016-10-16 NOTE — Patient Instructions (Signed)
AROM: Lateral Neck Flexion    Slowly tilt head toward one shoulder. Hold position __20__ seconds. Repeat to other side. Repeat _3_ times to each side. Do __1__ sets per session. Do _1-2_ sessions per day.  http://orth.exer.us/296   Copyright  VHI. All rights reserved.   AROM: Neck Rotation    Turn head slowly to look over one shoulder. Hold position _20_ seconds. Repeat to other side. Repeat _3_ times toward each side.  Do __1-2__ sessions per day.  http://orth.exer.us/294   Copyright  VHI. All rights reserved.    Chair Sitting    Sit at edge of seat, spine straight, one leg extended. Put a hand on each thigh and bend forward from the hip, keeping spine straight. Allow hand on extended leg to reach toward toes. Support upper body with other arm. Hold __20_ seconds. Repeat _3__ times each leg per session. Do _1-2__ sessions per day.  Copyright  VHI. All rights reserved.    TRUNK: Rotation    Sit at edge of sitting surface with upright posture. Turn at waist to look over shoulder. Hold for 20 seconds. Repeat for 3 reps toward each side. 1-2 times a day.   Copyright  VHI. All rights reserved.    Backward Bend (Standing)    Seated: Arch backward to make hollow of back deeper. Hold __5__ seconds. Repeat __10__ times per set. Do _1__ sets per session. Do _1-2_ sessions per day.  http://orth.exer.us/178   Copyright  VHI. All rights reserved.

## 2016-10-17 ENCOUNTER — Encounter: Payer: Self-pay | Admitting: Physical Therapy

## 2016-10-21 ENCOUNTER — Ambulatory Visit: Payer: PRIVATE HEALTH INSURANCE | Admitting: Physical Therapy

## 2016-10-24 ENCOUNTER — Encounter: Payer: Self-pay | Admitting: Physical Therapy

## 2016-10-24 ENCOUNTER — Ambulatory Visit: Payer: PRIVATE HEALTH INSURANCE | Admitting: Physical Therapy

## 2016-10-24 DIAGNOSIS — M6281 Muscle weakness (generalized): Secondary | ICD-10-CM | POA: Diagnosis not present

## 2016-10-24 DIAGNOSIS — R278 Other lack of coordination: Secondary | ICD-10-CM

## 2016-10-24 DIAGNOSIS — M6249 Contracture of muscle, multiple sites: Secondary | ICD-10-CM

## 2016-10-24 DIAGNOSIS — R2689 Other abnormalities of gait and mobility: Secondary | ICD-10-CM

## 2016-10-24 DIAGNOSIS — M25612 Stiffness of left shoulder, not elsewhere classified: Secondary | ICD-10-CM

## 2016-10-24 DIAGNOSIS — R2681 Unsteadiness on feet: Secondary | ICD-10-CM
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
## 2016-10-25 ENCOUNTER — Encounter: Payer: Self-pay | Admitting: Internal Medicine

## 2016-10-25 ENCOUNTER — Ambulatory Visit (INDEPENDENT_AMBULATORY_CARE_PROVIDER_SITE_OTHER): Payer: PRIVATE HEALTH INSURANCE | Admitting: Physician Assistant

## 2016-10-25 ENCOUNTER — Ambulatory Visit: Payer: Self-pay | Admitting: Internal Medicine

## 2016-10-25 VITALS — BP 124/86 | HR 64 | Temp 97.9°F | Resp 14 | Ht 73.0 in | Wt 230.0 lb

## 2016-10-25 DIAGNOSIS — G546 Phantom limb syndrome with pain: Secondary | ICD-10-CM | POA: Diagnosis not present

## 2016-10-25 DIAGNOSIS — Z89512 Acquired absence of left leg below knee: Secondary | ICD-10-CM

## 2016-10-25 DIAGNOSIS — F321 Major depressive disorder, single episode, moderate: Secondary | ICD-10-CM | POA: Diagnosis not present

## 2016-10-25 DIAGNOSIS — S069X3S Unspecified intracranial injury with loss of consciousness of 1 hour to 5 hours 59 minutes, sequela: Secondary | ICD-10-CM

## 2016-10-25 MED ORDER — VENLAFAXINE HCL ER 37.5 MG PO CP24: 37.5000 mg | ORAL_CAPSULE | Freq: Every day | ORAL | 1 refills | Status: DC
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
## 2016-10-28 ENCOUNTER — Encounter: Payer: Self-pay | Admitting: Physical Therapy

## 2016-10-28 ENCOUNTER — Ambulatory Visit: Payer: PRIVATE HEALTH INSURANCE | Admitting: Physical Therapy

## 2016-10-28 DIAGNOSIS — M6249 Contracture of muscle, multiple sites: Secondary | ICD-10-CM

## 2016-10-28 DIAGNOSIS — M6281 Muscle weakness (generalized): Secondary | ICD-10-CM | POA: Diagnosis not present

## 2016-10-28 DIAGNOSIS — R2689 Other abnormalities of gait and mobility: Secondary | ICD-10-CM

## 2016-10-28 DIAGNOSIS — R2681 Unsteadiness on feet: Secondary | ICD-10-CM
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
## 2016-10-31 ENCOUNTER — Encounter: Payer: Self-pay | Admitting: Physical Therapy

## 2016-10-31 ENCOUNTER — Ambulatory Visit: Payer: PRIVATE HEALTH INSURANCE | Admitting: Physical Therapy

## 2016-10-31 DIAGNOSIS — M6249 Contracture of muscle, multiple sites: Secondary | ICD-10-CM

## 2016-10-31 DIAGNOSIS — R2689 Other abnormalities of gait and mobility: Secondary | ICD-10-CM

## 2016-10-31 DIAGNOSIS — M6281 Muscle weakness (generalized): Secondary | ICD-10-CM | POA: Diagnosis not present

## 2016-10-31 DIAGNOSIS — S88112A Complete traumatic amputation at level between knee and ankle, left lower leg, initial encounter: Secondary | ICD-10-CM

## 2016-10-31 DIAGNOSIS — R2681 Unsteadiness on feet: Secondary | ICD-10-CM

## 2016-10-31 DIAGNOSIS — R278 Other lack of coordination: Secondary | ICD-10-CM
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
## 2016-11-04 ENCOUNTER — Encounter: Payer: Self-pay | Admitting: Physical Therapy

## 2016-11-06 ENCOUNTER — Encounter: Payer: Self-pay | Admitting: Physical Therapy

## 2016-11-06 ENCOUNTER — Ambulatory Visit: Payer: PRIVATE HEALTH INSURANCE | Attending: Orthopedic Surgery | Admitting: Physical Therapy

## 2016-11-06 DIAGNOSIS — R2689 Other abnormalities of gait and mobility: Secondary | ICD-10-CM

## 2016-11-06 DIAGNOSIS — M6281 Muscle weakness (generalized): Secondary | ICD-10-CM | POA: Insufficient documentation

## 2016-11-06 DIAGNOSIS — R2681 Unsteadiness on feet: Secondary | ICD-10-CM | POA: Diagnosis not present

## 2016-11-06 DIAGNOSIS — Z89512 Acquired absence of left leg below knee: Secondary | ICD-10-CM | POA: Diagnosis present

## 2016-11-06 DIAGNOSIS — M6249 Contracture of muscle, multiple sites: Secondary | ICD-10-CM | POA: Insufficient documentation

## 2016-11-06 DIAGNOSIS — Z89519 Acquired absence of unspecified leg below knee: Secondary | ICD-10-CM | POA: Insufficient documentation

## 2016-11-06 DIAGNOSIS — R278 Other lack of coordination: Secondary | ICD-10-CM | POA: Diagnosis present
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
## 2016-11-07 ENCOUNTER — Encounter: Payer: Self-pay | Admitting: Physical Therapy

## 2016-11-08 ENCOUNTER — Encounter: Payer: Self-pay | Admitting: Physical Therapy

## 2016-11-08 ENCOUNTER — Ambulatory Visit: Payer: PRIVATE HEALTH INSURANCE | Admitting: Physical Therapy

## 2016-11-08 DIAGNOSIS — M6281 Muscle weakness (generalized): Secondary | ICD-10-CM

## 2016-11-08 DIAGNOSIS — R2689 Other abnormalities of gait and mobility: Secondary | ICD-10-CM

## 2016-11-08 DIAGNOSIS — R278 Other lack of coordination: Secondary | ICD-10-CM

## 2016-11-08 DIAGNOSIS — S88112A Complete traumatic amputation at level between knee and ankle, left lower leg, initial encounter: Secondary | ICD-10-CM

## 2016-11-08 DIAGNOSIS — M6249 Contracture of muscle, multiple sites: Secondary | ICD-10-CM

## 2016-11-08 DIAGNOSIS — R2681 Unsteadiness on feet: Secondary | ICD-10-CM | POA: Diagnosis not present
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
## 2016-11-11 ENCOUNTER — Encounter: Payer: Self-pay | Admitting: Physical Therapy

## 2016-11-11 ENCOUNTER — Ambulatory Visit: Payer: PRIVATE HEALTH INSURANCE | Admitting: Physical Therapy

## 2016-11-11 DIAGNOSIS — M6281 Muscle weakness (generalized): Secondary | ICD-10-CM

## 2016-11-11 DIAGNOSIS — R278 Other lack of coordination: Secondary | ICD-10-CM

## 2016-11-11 DIAGNOSIS — R2689 Other abnormalities of gait and mobility: Secondary | ICD-10-CM

## 2016-11-11 DIAGNOSIS — S88112A Complete traumatic amputation at level between knee and ankle, left lower leg, initial encounter: Secondary | ICD-10-CM

## 2016-11-11 DIAGNOSIS — R2681 Unsteadiness on feet: Secondary | ICD-10-CM

## 2016-11-11 DIAGNOSIS — M6249 Contracture of muscle, multiple sites: Secondary | ICD-10-CM
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
## 2016-11-14 ENCOUNTER — Encounter: Payer: Self-pay | Admitting: Physical Therapy

## 2016-11-15 ENCOUNTER — Ambulatory Visit: Payer: PRIVATE HEALTH INSURANCE | Admitting: Physical Therapy

## 2016-11-15 ENCOUNTER — Encounter: Payer: Self-pay | Admitting: Physical Therapy

## 2016-11-15 DIAGNOSIS — M6281 Muscle weakness (generalized): Secondary | ICD-10-CM

## 2016-11-15 DIAGNOSIS — R2681 Unsteadiness on feet: Secondary | ICD-10-CM | POA: Diagnosis not present

## 2016-11-15 DIAGNOSIS — M6249 Contracture of muscle, multiple sites: Secondary | ICD-10-CM

## 2016-11-15 DIAGNOSIS — R278 Other lack of coordination: Secondary | ICD-10-CM

## 2016-11-15 DIAGNOSIS — R2689 Other abnormalities of gait and mobility: Secondary | ICD-10-CM
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
## 2016-11-18 ENCOUNTER — Encounter: Payer: Self-pay | Admitting: Physical Therapy

## 2016-11-18 ENCOUNTER — Ambulatory Visit: Payer: PRIVATE HEALTH INSURANCE | Admitting: Physical Therapy

## 2016-11-18 DIAGNOSIS — R2681 Unsteadiness on feet: Secondary | ICD-10-CM | POA: Diagnosis not present

## 2016-11-18 DIAGNOSIS — R278 Other lack of coordination: Secondary | ICD-10-CM

## 2016-11-18 DIAGNOSIS — M6281 Muscle weakness (generalized): Secondary | ICD-10-CM

## 2016-11-18 DIAGNOSIS — M6249 Contracture of muscle, multiple sites: Secondary | ICD-10-CM

## 2016-11-18 DIAGNOSIS — R2689 Other abnormalities of gait and mobility: Secondary | ICD-10-CM
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
## 2016-11-19 ENCOUNTER — Encounter: Payer: Self-pay | Admitting: *Deleted

## 2016-11-19 ENCOUNTER — Telehealth: Payer: Self-pay | Admitting: Physical Therapy

## 2016-11-19 NOTE — Telephone Encounter (Signed)
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  They will benefit from use of bilateral tall adult forearm crutches in order to improve safety with functional mobility.    If you agree, please submit request in EPIC under referral for DME (list bilateral tall adult forearm crutches in comments) or fax to [facility] at [phone].   Thank you, Aris Lot, SPT Jamey Reas PT, [facility] [address] [place], [place]  [address] Phone:  [phone] Fax:  [phone]

## 2016-11-21 ENCOUNTER — Encounter: Payer: Self-pay | Admitting: Physical Therapy

## 2016-11-22 ENCOUNTER — Encounter: Payer: Self-pay | Admitting: Physical Therapy

## 2016-11-22 ENCOUNTER — Ambulatory Visit: Payer: PRIVATE HEALTH INSURANCE | Admitting: Physical Therapy

## 2016-11-22 DIAGNOSIS — R278 Other lack of coordination: Secondary | ICD-10-CM

## 2016-11-22 DIAGNOSIS — M6249 Contracture of muscle, multiple sites: Secondary | ICD-10-CM

## 2016-11-22 DIAGNOSIS — R2681 Unsteadiness on feet: Secondary | ICD-10-CM | POA: Diagnosis not present

## 2016-11-22 DIAGNOSIS — R2689 Other abnormalities of gait and mobility: Secondary | ICD-10-CM

## 2016-11-22 DIAGNOSIS — M6281 Muscle weakness (generalized): Secondary | ICD-10-CM
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
## 2016-11-25 ENCOUNTER — Encounter: Payer: Self-pay | Admitting: Physical Therapy

## 2016-11-25 ENCOUNTER — Ambulatory Visit: Payer: PRIVATE HEALTH INSURANCE | Admitting: Physical Therapy

## 2016-11-25 DIAGNOSIS — R278 Other lack of coordination: Secondary | ICD-10-CM

## 2016-11-25 DIAGNOSIS — M6281 Muscle weakness (generalized): Secondary | ICD-10-CM

## 2016-11-25 DIAGNOSIS — M6249 Contracture of muscle, multiple sites: Secondary | ICD-10-CM

## 2016-11-25 DIAGNOSIS — R2681 Unsteadiness on feet: Secondary | ICD-10-CM

## 2016-11-25 DIAGNOSIS — R2689 Other abnormalities of gait and mobility: Secondary | ICD-10-CM
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
## 2016-11-27 ENCOUNTER — Other Ambulatory Visit: Payer: Self-pay | Admitting: Orthopedic Surgery

## 2016-11-27 DIAGNOSIS — Z89512 Acquired absence of left leg below knee: Secondary | ICD-10-CM

## 2016-11-28 ENCOUNTER — Encounter: Payer: Self-pay | Admitting: Physical Therapy

## 2016-11-29 ENCOUNTER — Ambulatory Visit: Payer: PRIVATE HEALTH INSURANCE | Admitting: Physical Therapy

## 2016-11-29 ENCOUNTER — Encounter: Payer: Self-pay | Admitting: Physical Therapy

## 2016-11-29 DIAGNOSIS — Z89519 Acquired absence of unspecified leg below knee: Secondary | ICD-10-CM

## 2016-11-29 DIAGNOSIS — M6249 Contracture of muscle, multiple sites: Secondary | ICD-10-CM

## 2016-11-29 DIAGNOSIS — R278 Other lack of coordination: Secondary | ICD-10-CM

## 2016-11-29 DIAGNOSIS — M6281 Muscle weakness (generalized): Secondary | ICD-10-CM

## 2016-11-29 DIAGNOSIS — R2681 Unsteadiness on feet: Secondary | ICD-10-CM

## 2016-11-29 DIAGNOSIS — R2689 Other abnormalities of gait and mobility: Secondary | ICD-10-CM
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
## 2016-12-02 ENCOUNTER — Encounter: Payer: Self-pay | Admitting: Physical Therapy

## 2016-12-03 ENCOUNTER — Encounter: Payer: Self-pay | Admitting: *Deleted

## 2016-12-04 ENCOUNTER — Encounter: Payer: Self-pay | Admitting: Physical Therapy

## 2016-12-04 ENCOUNTER — Ambulatory Visit: Payer: PRIVATE HEALTH INSURANCE | Admitting: Physical Therapy

## 2016-12-04 DIAGNOSIS — R2681 Unsteadiness on feet: Secondary | ICD-10-CM

## 2016-12-04 DIAGNOSIS — R278 Other lack of coordination: Secondary | ICD-10-CM

## 2016-12-04 DIAGNOSIS — M6249 Contracture of muscle, multiple sites: Secondary | ICD-10-CM

## 2016-12-04 DIAGNOSIS — R2689 Other abnormalities of gait and mobility: Secondary | ICD-10-CM

## 2016-12-04 DIAGNOSIS — M6281 Muscle weakness (generalized): Secondary | ICD-10-CM
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
## 2016-12-05 ENCOUNTER — Encounter: Payer: Self-pay | Admitting: Physical Therapy

## 2016-12-06 ENCOUNTER — Ambulatory Visit: Payer: PRIVATE HEALTH INSURANCE | Attending: Orthopedic Surgery | Admitting: Physical Therapy

## 2016-12-06 ENCOUNTER — Encounter: Payer: Self-pay | Admitting: Physical Therapy

## 2016-12-06 DIAGNOSIS — R2681 Unsteadiness on feet: Secondary | ICD-10-CM | POA: Diagnosis present

## 2016-12-06 DIAGNOSIS — R278 Other lack of coordination: Secondary | ICD-10-CM | POA: Insufficient documentation

## 2016-12-06 DIAGNOSIS — M6281 Muscle weakness (generalized): Secondary | ICD-10-CM | POA: Diagnosis present

## 2016-12-06 DIAGNOSIS — M6249 Contracture of muscle, multiple sites: Secondary | ICD-10-CM | POA: Diagnosis present

## 2016-12-06 DIAGNOSIS — R2689 Other abnormalities of gait and mobility: Secondary | ICD-10-CM | POA: Diagnosis present
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
## 2016-12-09 ENCOUNTER — Encounter: Payer: Self-pay | Admitting: Physical Therapy

## 2016-12-09 ENCOUNTER — Ambulatory Visit: Payer: PRIVATE HEALTH INSURANCE | Admitting: Physical Therapy

## 2016-12-09 DIAGNOSIS — R2689 Other abnormalities of gait and mobility: Secondary | ICD-10-CM

## 2016-12-09 DIAGNOSIS — R2681 Unsteadiness on feet: Secondary | ICD-10-CM

## 2016-12-09 DIAGNOSIS — M6249 Contracture of muscle, multiple sites: Secondary | ICD-10-CM

## 2016-12-09 DIAGNOSIS — M6281 Muscle weakness (generalized): Secondary | ICD-10-CM

## 2016-12-09 DIAGNOSIS — R278 Other lack of coordination: Secondary | ICD-10-CM
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
## 2016-12-12 ENCOUNTER — Encounter: Payer: Self-pay | Admitting: Physical Therapy

## 2016-12-13 ENCOUNTER — Encounter: Payer: Self-pay | Admitting: Physical Therapy

## 2016-12-13 ENCOUNTER — Ambulatory Visit: Payer: PRIVATE HEALTH INSURANCE | Admitting: Physical Therapy

## 2016-12-13 DIAGNOSIS — R2681 Unsteadiness on feet: Secondary | ICD-10-CM | POA: Diagnosis not present

## 2016-12-13 DIAGNOSIS — M6281 Muscle weakness (generalized): Secondary | ICD-10-CM

## 2016-12-13 DIAGNOSIS — R2689 Other abnormalities of gait and mobility: Secondary | ICD-10-CM
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
## 2016-12-16 ENCOUNTER — Ambulatory Visit: Payer: PRIVATE HEALTH INSURANCE | Admitting: Physical Therapy

## 2016-12-16 ENCOUNTER — Encounter: Payer: Self-pay | Admitting: Physical Therapy

## 2016-12-16 DIAGNOSIS — R2689 Other abnormalities of gait and mobility: Secondary | ICD-10-CM

## 2016-12-16 DIAGNOSIS — R2681 Unsteadiness on feet: Secondary | ICD-10-CM

## 2016-12-16 DIAGNOSIS — R278 Other lack of coordination: Secondary | ICD-10-CM

## 2016-12-16 DIAGNOSIS — M6281 Muscle weakness (generalized): Secondary | ICD-10-CM

## 2016-12-16 DIAGNOSIS — M6249 Contracture of muscle, multiple sites: Secondary | ICD-10-CM
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
## 2016-12-19 ENCOUNTER — Encounter: Payer: Self-pay | Admitting: Physical Therapy

## 2016-12-20 ENCOUNTER — Encounter: Payer: Self-pay | Admitting: Physical Therapy

## 2016-12-20 ENCOUNTER — Ambulatory Visit: Payer: PRIVATE HEALTH INSURANCE | Admitting: Physical Therapy

## 2016-12-20 DIAGNOSIS — R2681 Unsteadiness on feet: Secondary | ICD-10-CM | POA: Diagnosis not present

## 2016-12-20 DIAGNOSIS — M6249 Contracture of muscle, multiple sites: Secondary | ICD-10-CM

## 2016-12-20 DIAGNOSIS — M6281 Muscle weakness (generalized): Secondary | ICD-10-CM

## 2016-12-20 DIAGNOSIS — R278 Other lack of coordination: Secondary | ICD-10-CM

## 2016-12-20 DIAGNOSIS — R2689 Other abnormalities of gait and mobility: Secondary | ICD-10-CM
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
## 2016-12-23 ENCOUNTER — Ambulatory Visit: Payer: PRIVATE HEALTH INSURANCE | Admitting: Physical Therapy

## 2016-12-23 ENCOUNTER — Encounter: Payer: Self-pay | Admitting: Physical Therapy

## 2016-12-23 DIAGNOSIS — M6281 Muscle weakness (generalized): Secondary | ICD-10-CM

## 2016-12-23 DIAGNOSIS — R2681 Unsteadiness on feet: Secondary | ICD-10-CM | POA: Diagnosis not present

## 2016-12-23 DIAGNOSIS — M6249 Contracture of muscle, multiple sites: Secondary | ICD-10-CM

## 2016-12-23 DIAGNOSIS — R2689 Other abnormalities of gait and mobility: Secondary | ICD-10-CM
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
## 2016-12-26 ENCOUNTER — Encounter: Payer: Self-pay | Admitting: Physical Therapy

## 2016-12-27 ENCOUNTER — Encounter: Payer: Self-pay | Admitting: Physical Therapy

## 2016-12-27 ENCOUNTER — Ambulatory Visit: Payer: PRIVATE HEALTH INSURANCE | Admitting: Physical Therapy

## 2016-12-27 DIAGNOSIS — R2681 Unsteadiness on feet: Secondary | ICD-10-CM

## 2016-12-27 DIAGNOSIS — R2689 Other abnormalities of gait and mobility: Secondary | ICD-10-CM

## 2016-12-27 DIAGNOSIS — M6281 Muscle weakness (generalized): Secondary | ICD-10-CM
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
## 2016-12-30 ENCOUNTER — Encounter: Payer: Self-pay | Admitting: Physical Therapy

## 2016-12-30 ENCOUNTER — Ambulatory Visit: Payer: PRIVATE HEALTH INSURANCE | Admitting: Physical Therapy

## 2016-12-30 DIAGNOSIS — R2681 Unsteadiness on feet: Secondary | ICD-10-CM

## 2016-12-30 DIAGNOSIS — R2689 Other abnormalities of gait and mobility: Secondary | ICD-10-CM

## 2016-12-30 DIAGNOSIS — M6281 Muscle weakness (generalized): Secondary | ICD-10-CM
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
## 2017-01-03 ENCOUNTER — Ambulatory Visit: Payer: PRIVATE HEALTH INSURANCE | Admitting: Physical Therapy

## 2017-01-03 ENCOUNTER — Encounter: Payer: Self-pay | Admitting: Physical Therapy

## 2017-01-03 DIAGNOSIS — R2689 Other abnormalities of gait and mobility: Secondary | ICD-10-CM

## 2017-01-03 DIAGNOSIS — R2681 Unsteadiness on feet: Secondary | ICD-10-CM

## 2017-01-03 DIAGNOSIS — M6281 Muscle weakness (generalized): Secondary | ICD-10-CM
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
## 2017-01-06 ENCOUNTER — Encounter: Payer: Self-pay | Admitting: Physical Therapy

## 2017-01-06 ENCOUNTER — Ambulatory Visit: Payer: PRIVATE HEALTH INSURANCE | Attending: Orthopedic Surgery | Admitting: Physical Therapy

## 2017-01-06 DIAGNOSIS — M6281 Muscle weakness (generalized): Secondary | ICD-10-CM | POA: Diagnosis present

## 2017-01-06 DIAGNOSIS — R2681 Unsteadiness on feet: Secondary | ICD-10-CM | POA: Diagnosis present

## 2017-01-06 DIAGNOSIS — R2689 Other abnormalities of gait and mobility: Secondary | ICD-10-CM | POA: Insufficient documentation

## 2017-01-06 DIAGNOSIS — M6249 Contracture of muscle, multiple sites: Secondary | ICD-10-CM | POA: Insufficient documentation
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
## 2017-01-10 ENCOUNTER — Encounter: Payer: Self-pay | Admitting: Physical Therapy

## 2017-01-10 ENCOUNTER — Ambulatory Visit: Payer: PRIVATE HEALTH INSURANCE | Admitting: Physical Therapy

## 2017-01-10 DIAGNOSIS — R2681 Unsteadiness on feet: Secondary | ICD-10-CM | POA: Diagnosis not present

## 2017-01-10 DIAGNOSIS — R2689 Other abnormalities of gait and mobility: Secondary | ICD-10-CM

## 2017-01-10 DIAGNOSIS — M6281 Muscle weakness (generalized): Secondary | ICD-10-CM

## 2017-01-10 NOTE — Patient Instructions (Signed)
PERFORM THESE EXERCISES WITH A RAIL OR CHAIR BACK ON BOTH SIDES OF YOU - HOLD ON WITH BOTH HANDS.   Step-Down: Forward    Step down forward. Keep pelvis neutral. Do __10_ times, each leg, _1-2__ times per day.  http://ss.exer.us/176   Copyright  VHI. All rights reserved.  Step: Down, Lateral    Stand sideways on step. Step down with good leg. Step back up, other leg first. Repeat __10__ times per set. Do __1__ sets per session. Perform 1-2 sets per day.   Copyright  VHI. All rights reserved.
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
## 2017-01-13 ENCOUNTER — Ambulatory Visit: Payer: PRIVATE HEALTH INSURANCE | Admitting: Physical Therapy

## 2017-01-13 ENCOUNTER — Encounter: Payer: Self-pay | Admitting: Physical Therapy

## 2017-01-13 DIAGNOSIS — R2689 Other abnormalities of gait and mobility: Secondary | ICD-10-CM

## 2017-01-13 DIAGNOSIS — R2681 Unsteadiness on feet: Secondary | ICD-10-CM | POA: Diagnosis not present

## 2017-01-13 DIAGNOSIS — M6281 Muscle weakness (generalized): Secondary | ICD-10-CM
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
## 2017-01-16 ENCOUNTER — Encounter: Payer: Self-pay | Admitting: Physical Therapy

## 2017-01-16 ENCOUNTER — Ambulatory Visit: Payer: PRIVATE HEALTH INSURANCE | Admitting: Physical Therapy

## 2017-01-16 DIAGNOSIS — R2681 Unsteadiness on feet: Secondary | ICD-10-CM

## 2017-01-16 DIAGNOSIS — R2689 Other abnormalities of gait and mobility: Secondary | ICD-10-CM

## 2017-01-16 DIAGNOSIS — M6281 Muscle weakness (generalized): Secondary | ICD-10-CM
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
## 2017-01-17 ENCOUNTER — Encounter: Payer: Self-pay | Admitting: Physical Therapy

## 2017-01-27 ENCOUNTER — Ambulatory Visit: Payer: PRIVATE HEALTH INSURANCE | Admitting: Physical Therapy

## 2017-01-27 ENCOUNTER — Encounter: Payer: Self-pay | Admitting: Physical Therapy

## 2017-01-27 DIAGNOSIS — R2689 Other abnormalities of gait and mobility: Secondary | ICD-10-CM

## 2017-01-27 DIAGNOSIS — R2681 Unsteadiness on feet: Secondary | ICD-10-CM | POA: Diagnosis not present

## 2017-01-27 DIAGNOSIS — M6281 Muscle weakness (generalized): Secondary | ICD-10-CM
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
## 2017-01-31 ENCOUNTER — Encounter: Payer: Self-pay | Admitting: Physical Therapy

## 2017-01-31 ENCOUNTER — Ambulatory Visit: Payer: Self-pay | Admitting: Internal Medicine

## 2017-01-31 ENCOUNTER — Ambulatory Visit: Payer: PRIVATE HEALTH INSURANCE | Admitting: Physical Therapy

## 2017-01-31 DIAGNOSIS — R2681 Unsteadiness on feet: Secondary | ICD-10-CM | POA: Diagnosis not present

## 2017-01-31 DIAGNOSIS — R2689 Other abnormalities of gait and mobility: Secondary | ICD-10-CM

## 2017-01-31 DIAGNOSIS — M6281 Muscle weakness (generalized): Secondary | ICD-10-CM
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
## 2017-02-03 ENCOUNTER — Ambulatory Visit: Payer: PRIVATE HEALTH INSURANCE | Admitting: Physical Therapy

## 2017-02-03 ENCOUNTER — Encounter: Payer: Self-pay | Admitting: Physical Therapy

## 2017-02-03 DIAGNOSIS — R2689 Other abnormalities of gait and mobility: Secondary | ICD-10-CM

## 2017-02-03 DIAGNOSIS — R2681 Unsteadiness on feet: Secondary | ICD-10-CM

## 2017-02-03 DIAGNOSIS — M6281 Muscle weakness (generalized): Secondary | ICD-10-CM

## 2017-02-03 DIAGNOSIS — M6249 Contracture of muscle, multiple sites: Secondary | ICD-10-CM
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
## 2017-02-07 ENCOUNTER — Encounter: Payer: Self-pay | Admitting: Physical Therapy

## 2017-02-07 ENCOUNTER — Ambulatory Visit: Payer: PRIVATE HEALTH INSURANCE | Attending: Orthopedic Surgery | Admitting: Physical Therapy

## 2017-02-07 DIAGNOSIS — R278 Other lack of coordination: Secondary | ICD-10-CM | POA: Diagnosis present

## 2017-02-07 DIAGNOSIS — R2689 Other abnormalities of gait and mobility: Secondary | ICD-10-CM

## 2017-02-07 DIAGNOSIS — M6249 Contracture of muscle, multiple sites: Secondary | ICD-10-CM | POA: Diagnosis present

## 2017-02-07 DIAGNOSIS — M6281 Muscle weakness (generalized): Secondary | ICD-10-CM

## 2017-02-07 DIAGNOSIS — R2681 Unsteadiness on feet: Secondary | ICD-10-CM
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
## 2017-02-10 ENCOUNTER — Encounter: Payer: Self-pay | Admitting: Physical Therapy

## 2017-02-10 ENCOUNTER — Ambulatory Visit: Payer: PRIVATE HEALTH INSURANCE | Admitting: Physical Therapy

## 2017-02-10 DIAGNOSIS — M6281 Muscle weakness (generalized): Secondary | ICD-10-CM

## 2017-02-10 DIAGNOSIS — R2681 Unsteadiness on feet: Secondary | ICD-10-CM

## 2017-02-10 DIAGNOSIS — R2689 Other abnormalities of gait and mobility: Secondary | ICD-10-CM
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
## 2017-02-14 ENCOUNTER — Encounter: Payer: Self-pay | Admitting: Physical Therapy

## 2017-02-17 ENCOUNTER — Encounter: Payer: Self-pay | Admitting: Physical Medicine & Rehabilitation

## 2017-02-17 ENCOUNTER — Encounter: Payer: Self-pay | Admitting: Physical Therapy

## 2017-02-17 ENCOUNTER — Ambulatory Visit: Payer: PRIVATE HEALTH INSURANCE | Admitting: Physical Therapy

## 2017-02-17 ENCOUNTER — Encounter
Payer: PRIVATE HEALTH INSURANCE | Attending: Physical Medicine & Rehabilitation | Admitting: Physical Medicine & Rehabilitation

## 2017-02-17 VITALS — BP 133/79 | HR 60

## 2017-02-17 DIAGNOSIS — E559 Vitamin D deficiency, unspecified: Secondary | ICD-10-CM | POA: Insufficient documentation

## 2017-02-17 DIAGNOSIS — G8929 Other chronic pain: Secondary | ICD-10-CM | POA: Insufficient documentation

## 2017-02-17 DIAGNOSIS — Z8781 Personal history of (healed) traumatic fracture: Secondary | ICD-10-CM

## 2017-02-17 DIAGNOSIS — R2681 Unsteadiness on feet: Secondary | ICD-10-CM | POA: Diagnosis not present

## 2017-02-17 DIAGNOSIS — R7303 Prediabetes: Secondary | ICD-10-CM | POA: Diagnosis not present

## 2017-02-17 DIAGNOSIS — M6281 Muscle weakness (generalized): Secondary | ICD-10-CM

## 2017-02-17 DIAGNOSIS — Z87891 Personal history of nicotine dependence: Secondary | ICD-10-CM | POA: Insufficient documentation

## 2017-02-17 DIAGNOSIS — S069X3S Unspecified intracranial injury with loss of consciousness of 1 hour to 5 hours 59 minutes, sequela: Secondary | ICD-10-CM

## 2017-02-17 DIAGNOSIS — K219 Gastro-esophageal reflux disease without esophagitis: Secondary | ICD-10-CM | POA: Insufficient documentation

## 2017-02-17 DIAGNOSIS — R2689 Other abnormalities of gait and mobility: Secondary | ICD-10-CM

## 2017-02-17 DIAGNOSIS — Z89512 Acquired absence of left leg below knee: Secondary | ICD-10-CM | POA: Diagnosis present

## 2017-02-17 DIAGNOSIS — J449 Chronic obstructive pulmonary disease, unspecified: Secondary | ICD-10-CM | POA: Diagnosis not present

## 2017-02-17 DIAGNOSIS — I10 Essential (primary) hypertension: Secondary | ICD-10-CM | POA: Diagnosis not present

## 2017-02-17 DIAGNOSIS — S069X0S Unspecified intracranial injury without loss of consciousness, sequela: Secondary | ICD-10-CM | POA: Insufficient documentation

## 2017-02-17 DIAGNOSIS — M542 Cervicalgia: Secondary | ICD-10-CM | POA: Insufficient documentation

## 2017-02-17 DIAGNOSIS — X58XXXS Exposure to other specified factors, sequela: Secondary | ICD-10-CM | POA: Diagnosis not present

## 2017-02-17 DIAGNOSIS — R51 Headache: Secondary | ICD-10-CM | POA: Insufficient documentation

## 2017-02-17 DIAGNOSIS — S143XXS Injury of brachial plexus, sequela: Secondary | ICD-10-CM | POA: Diagnosis not present

## 2017-02-17 DIAGNOSIS — E785 Hyperlipidemia, unspecified: Secondary | ICD-10-CM | POA: Insufficient documentation

## 2017-02-17 MED ORDER — METHOCARBAMOL 500 MG PO TABS: 500.0000 mg | ORAL_TABLET | Freq: Four times a day (QID) | ORAL | 2 refills | Status: DC | PRN

## 2017-02-17 NOTE — Patient Instructions (Addendum)
TYLENOL: CAN USE UP TO 500MG  EVERY 4 HOURS  CAN ALTERNATE OR TAKE WITH IBUPROFEN  IBUPROFEN: CAN TAKE UP TO 800MG  THREE X DAILY WITH FOOD CAN TAKE WITH TYLENOL OR IN BETWEEN DOSES.   WORK GETTING YOUR HEAD BACK AND CHIN IN. WORK ON IMPROVING YOUR SHOULDER RANGE OF MOTION AS WELL. STAND TALL AND WALK TALL!!!    PLEASE FEEL FREE TO CALL OUR OFFICE WITH ANY PROBLEMS OR QUESTIONS ([phone])
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
## 2017-02-19 ENCOUNTER — Telehealth: Payer: Self-pay | Admitting: Physical Medicine & Rehabilitation

## 2017-02-19 NOTE — Telephone Encounter (Signed)
Ollie with [facility] needs a call back about patients refill for Methocarbamol is on back order and would like to know if it can be changed to different amount 400 or 750 or something else.  Please call her at [phone].

## 2017-02-20 ENCOUNTER — Encounter: Payer: Self-pay | Admitting: Physical Therapy

## 2017-02-20 MED ORDER — BACLOFEN 10 MG PO TABS: 10.0000 mg | ORAL_TABLET | Freq: Three times a day (TID) | ORAL | 2 refills | Status: DC

## 2017-02-20 NOTE — Telephone Encounter (Signed)
Medication ordered, family notified. Patient's wife did note that they found some methocarbamol around the house.  It may last until methocarbamol gets restocked. If not a back up plan is in place

## 2017-02-20 NOTE — Addendum Note (Signed)
Addended by: Geryl Rankins D on: 02/20/2017 03:51 PM   Modules accepted: Orders

## 2017-02-20 NOTE — Telephone Encounter (Signed)
I've already replied to this with written instructions on fax which was sent to [place]

## 2017-02-24 ENCOUNTER — Ambulatory Visit: Payer: PRIVATE HEALTH INSURANCE | Admitting: Physical Therapy

## 2017-02-24 DIAGNOSIS — M6281 Muscle weakness (generalized): Secondary | ICD-10-CM

## 2017-02-24 DIAGNOSIS — R2681 Unsteadiness on feet: Secondary | ICD-10-CM

## 2017-02-24 DIAGNOSIS — R2689 Other abnormalities of gait and mobility: Secondary | ICD-10-CM
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
## 2017-02-28 ENCOUNTER — Encounter: Payer: Self-pay | Admitting: Physical Therapy

## 2017-03-03 ENCOUNTER — Encounter: Payer: Self-pay | Admitting: Physical Therapy

## 2017-03-03 ENCOUNTER — Ambulatory Visit: Payer: PRIVATE HEALTH INSURANCE | Admitting: Physical Therapy

## 2017-03-03 DIAGNOSIS — R2689 Other abnormalities of gait and mobility: Secondary | ICD-10-CM

## 2017-03-03 DIAGNOSIS — R2681 Unsteadiness on feet: Secondary | ICD-10-CM

## 2017-03-03 DIAGNOSIS — M6249 Contracture of muscle, multiple sites: Secondary | ICD-10-CM

## 2017-03-03 DIAGNOSIS — M6281 Muscle weakness (generalized): Secondary | ICD-10-CM

## 2017-03-03 DIAGNOSIS — R278 Other lack of coordination: Secondary | ICD-10-CM
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
## 2017-03-03 NOTE — Patient Instructions (Addendum)
Counter balance & flexibility activities: chair back opposite to counter. 1. Forward step: a) toe up with line or yardstick  B) step one foot over the line  C) square up shoulders & hips  D) try to let go & balance for 5-10 sec        Repeat on other foot.  Do 5-10 times on each foot  2. Side step with rotation:  Face counter with right foot close to line   A) hold counter & step left foot over line using headlight at hip to turn / rotate foot as you step.  B) let go & balance for 5-10 sec  C) step back over line   Repeat 5-10 times stepping left foot      Then stand with left foot to line & step over with right foot 5-10 times.   Crossovers    Move to side: 1) cross right leg in front, then 2) bring back leg out to side. Repeat sequence in same direction. Reverse sequence, moving in opposite direction. Repeat sequence __5-10__ times per session. May have to do several laps to stay near counter.  Repeat crossing behind   Copyright  VHI. All rights reserved.  Braiding    Move to side: 1) cross right leg in front of left, 2) bring back leg out to side, then 3) cross right leg behind left, 4) bring left leg out to side. Continue sequence in same direction. Reverse sequence, moving in opposite direction. Repeat sequence __5-10__ times per session. May have to do several laps to get reps.  Copyright  VHI. All rights reserved.

## 2017-03-04 ENCOUNTER — Encounter: Payer: Self-pay | Admitting: Physical Therapy

## 2017-03-12 ENCOUNTER — Ambulatory Visit: Payer: PRIVATE HEALTH INSURANCE | Attending: Orthopedic Surgery | Admitting: Physical Therapy

## 2017-03-12 ENCOUNTER — Encounter: Payer: Self-pay | Admitting: Physical Therapy

## 2017-03-12 DIAGNOSIS — R278 Other lack of coordination: Secondary | ICD-10-CM | POA: Diagnosis present

## 2017-03-12 DIAGNOSIS — M6281 Muscle weakness (generalized): Secondary | ICD-10-CM | POA: Diagnosis present

## 2017-03-12 DIAGNOSIS — R2689 Other abnormalities of gait and mobility: Secondary | ICD-10-CM | POA: Diagnosis present

## 2017-03-12 DIAGNOSIS — R2681 Unsteadiness on feet: Secondary | ICD-10-CM

## 2017-03-12 DIAGNOSIS — M6249 Contracture of muscle, multiple sites: Secondary | ICD-10-CM

## 2017-03-12 DIAGNOSIS — M25612 Stiffness of left shoulder, not elsewhere classified: Secondary | ICD-10-CM | POA: Insufficient documentation

## 2017-03-12 NOTE — Patient Instructions (Addendum)
Looking over your shoulders: 1. Turn head  2. Pull shoulder back  3. Shift more weight on the foot to side looking and pull hip back.  Look right & left.   Walking faster: quicker steps & LONGER steps.  Walk looking around.  1. Look right for 3 seconds (count out loud) then look left for 3 seconds. Goal is to keep walking straight & don't slow down.      2. Look up & down for 3 seconds each.   Backward Walking    Walk backward, toes of each foot coming down first. Take long, even strides. Make sure you have a clear pathway with no obstructions when you do this.   Copyright  VHI. All rights reserved.  Side Step    Move one leg out to side with knee slightly bent, then bring other leg to it. Session: Walk _10__ steps each way.    Copyright  VHI. All rights reserved.  March    Lift knee toward chest to 90 bend, then lower leg as knee is straightened. Session: March _10___  Steps.   Copyright  VHI. All rights reserved.   DO NEXT TWO EXERCISES NEAR COUNTER OR RAIL  Crossovers    Move to side: 1) cross right leg in front, then 2) bring back leg out to side. Repeat sequence in same direction. Reverse sequence, moving in opposite direction. Repeat crossing right leg behind, then bring front leg out to side. Repeat sequence in same direction. Reverse sequence, moving in opposite direction.  Repeat sequence __10__ times / steps each way. You may have to do laps for 10 steps.   Copyright  VHI. All rights reserved.  Braiding    Move to side: 1) cross right leg in front of left, 2) bring back leg out to side, then 3) cross right leg behind left, 4) bring left leg out to side. Continue sequence in same direction. Reverse sequence, moving in opposite direction. Repeat sequence __10__ times / steps each way. You may have to do laps for 10 steps.   Copyright  VHI. All rights reserved.
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
## 2017-03-17 ENCOUNTER — Encounter: Payer: Self-pay | Admitting: Physical Therapy

## 2017-03-17 ENCOUNTER — Ambulatory Visit: Payer: PRIVATE HEALTH INSURANCE | Admitting: Physical Therapy

## 2017-03-17 DIAGNOSIS — R2681 Unsteadiness on feet: Secondary | ICD-10-CM | POA: Diagnosis not present

## 2017-03-17 DIAGNOSIS — M25612 Stiffness of left shoulder, not elsewhere classified: Secondary | ICD-10-CM

## 2017-03-17 DIAGNOSIS — M6281 Muscle weakness (generalized): Secondary | ICD-10-CM

## 2017-03-17 DIAGNOSIS — M6249 Contracture of muscle, multiple sites: Secondary | ICD-10-CM
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
## 2017-03-21 ENCOUNTER — Encounter: Payer: Self-pay | Admitting: Physician Assistant

## 2017-03-21 ENCOUNTER — Ambulatory Visit (INDEPENDENT_AMBULATORY_CARE_PROVIDER_SITE_OTHER): Payer: PRIVATE HEALTH INSURANCE | Admitting: Physician Assistant

## 2017-03-21 VITALS — BP 110/76 | HR 60 | Temp 97.7°F | Resp 14 | Ht 73.0 in | Wt 217.0 lb

## 2017-03-21 DIAGNOSIS — M869 Osteomyelitis, unspecified: Secondary | ICD-10-CM | POA: Diagnosis not present

## 2017-03-21 DIAGNOSIS — R7303 Prediabetes: Secondary | ICD-10-CM

## 2017-03-21 DIAGNOSIS — E559 Vitamin D deficiency, unspecified: Secondary | ICD-10-CM

## 2017-03-21 DIAGNOSIS — E782 Mixed hyperlipidemia: Secondary | ICD-10-CM | POA: Diagnosis not present

## 2017-03-21 DIAGNOSIS — Z23 Encounter for immunization: Secondary | ICD-10-CM

## 2017-03-21 DIAGNOSIS — Z89512 Acquired absence of left leg below knee: Secondary | ICD-10-CM

## 2017-03-21 DIAGNOSIS — I158 Other secondary hypertension: Secondary | ICD-10-CM

## 2017-03-21 NOTE — Patient Instructions (Signed)

## 2017-03-22 LAB — COMPREHENSIVE METABOLIC PANEL
AG Ratio: 2 (calc) (ref 1.0–2.5)
ALT: 32 U/L (ref 9–46)
AST: 21 U/L (ref 10–35)
Albumin: 4.4 g/dL (ref 3.6–5.1)
Alkaline phosphatase (APISO): 88 U/L (ref 40–115)
BUN: 19 mg/dL (ref 7–25)
CO2: 25 mmol/L (ref 20–32)
Calcium: 9.8 mg/dL (ref 8.6–10.3)
Chloride: 104 mmol/L (ref 98–110)
Creat: 0.73 mg/dL (ref 0.70–1.33)
Globulin: 2.2 g/dL (calc) (ref 1.9–3.7)
Glucose, Bld: 100 mg/dL — ABNORMAL HIGH (ref 65–99)
Potassium: 4.4 mmol/L (ref 3.5–5.3)
Sodium: 138 mmol/L (ref 135–146)
Total Bilirubin: 0.5 mg/dL (ref 0.2–1.2)
Total Protein: 6.6 g/dL (ref 6.1–8.1)

## 2017-03-22 LAB — LIPID PANEL
Cholesterol: 284 mg/dL — ABNORMAL HIGH (ref <200)
HDL: 41 mg/dL (ref >40)
LDL Cholesterol (Calc): 207 mg/dL (calc) — ABNORMAL HIGH
Non-HDL Cholesterol (Calc): 243 mg/dL (calc) — ABNORMAL HIGH (ref <130)
Total CHOL/HDL Ratio: 6.9 (calc) — ABNORMAL HIGH (ref <5.0)
Triglycerides: 183 mg/dL — ABNORMAL HIGH (ref <150)

## 2017-03-22 LAB — HEMOGLOBIN A1C
Hgb A1c MFr Bld: 5.6 % of total Hgb (ref <5.7)
Mean Plasma Glucose: 114 (calc)
eAG (mmol/L): 6.3 (calc)

## 2017-03-24 ENCOUNTER — Ambulatory Visit: Payer: PRIVATE HEALTH INSURANCE | Admitting: Physical Therapy

## 2017-03-24 ENCOUNTER — Other Ambulatory Visit: Payer: Self-pay | Admitting: Physician Assistant

## 2017-03-24 ENCOUNTER — Encounter: Payer: Self-pay | Admitting: Physician Assistant

## 2017-03-24 MED ORDER — ROSUVASTATIN CALCIUM 10 MG PO TABS: 10.0000 mg | ORAL_TABLET | Freq: Every day | ORAL | 11 refills | Status: DC

## 2017-03-27 ENCOUNTER — Ambulatory Visit: Payer: PRIVATE HEALTH INSURANCE | Admitting: Physical Therapy

## 2017-03-27 ENCOUNTER — Encounter: Payer: Self-pay | Admitting: Physical Therapy

## 2017-03-27 DIAGNOSIS — R2689 Other abnormalities of gait and mobility: Secondary | ICD-10-CM

## 2017-03-27 DIAGNOSIS — R2681 Unsteadiness on feet: Secondary | ICD-10-CM

## 2017-03-27 DIAGNOSIS — M6281 Muscle weakness (generalized): Secondary | ICD-10-CM

## 2017-03-27 DIAGNOSIS — M6249 Contracture of muscle, multiple sites: Secondary | ICD-10-CM
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
## 2017-03-29 ENCOUNTER — Encounter: Payer: Self-pay | Admitting: Physician Assistant

## 2017-03-30 MED ORDER — ROSUVASTATIN CALCIUM 10 MG PO TABS: 10.0000 mg | ORAL_TABLET | Freq: Every day | ORAL | 1 refills | Status: DC

## 2017-04-02 ENCOUNTER — Encounter: Payer: Self-pay | Admitting: Physician Assistant

## 2017-04-02 DIAGNOSIS — M542 Cervicalgia: Secondary | ICD-10-CM

## 2017-04-15 ENCOUNTER — Ambulatory Visit: Payer: PRIVATE HEALTH INSURANCE | Attending: Physician Assistant | Admitting: Physical Therapy

## 2017-04-15 DIAGNOSIS — M62838 Other muscle spasm: Secondary | ICD-10-CM | POA: Insufficient documentation

## 2017-04-15 DIAGNOSIS — M542 Cervicalgia: Secondary | ICD-10-CM | POA: Insufficient documentation

## 2017-04-15 DIAGNOSIS — M6249 Contracture of muscle, multiple sites: Secondary | ICD-10-CM | POA: Insufficient documentation

## 2017-04-15 DIAGNOSIS — M6281 Muscle weakness (generalized): Secondary | ICD-10-CM | POA: Insufficient documentation
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
## 2017-04-21 ENCOUNTER — Encounter: Payer: Self-pay | Admitting: Physical Medicine & Rehabilitation

## 2017-04-21 ENCOUNTER — Ambulatory Visit: Payer: PRIVATE HEALTH INSURANCE

## 2017-04-21 ENCOUNTER — Encounter: Payer: Self-pay | Attending: Physical Medicine & Rehabilitation | Admitting: Physical Medicine & Rehabilitation

## 2017-04-21 VITALS — BP 143/83 | HR 63

## 2017-04-21 DIAGNOSIS — K219 Gastro-esophageal reflux disease without esophagitis: Secondary | ICD-10-CM | POA: Insufficient documentation

## 2017-04-21 DIAGNOSIS — M542 Cervicalgia: Secondary | ICD-10-CM | POA: Diagnosis not present

## 2017-04-21 DIAGNOSIS — R51 Headache: Secondary | ICD-10-CM | POA: Insufficient documentation

## 2017-04-21 DIAGNOSIS — S069X0S Unspecified intracranial injury without loss of consciousness, sequela: Secondary | ICD-10-CM | POA: Insufficient documentation

## 2017-04-21 DIAGNOSIS — G546 Phantom limb syndrome with pain: Secondary | ICD-10-CM

## 2017-04-21 DIAGNOSIS — M62838 Other muscle spasm: Secondary | ICD-10-CM

## 2017-04-21 DIAGNOSIS — R7303 Prediabetes: Secondary | ICD-10-CM | POA: Insufficient documentation

## 2017-04-21 DIAGNOSIS — Z87820 Personal history of traumatic brain injury: Secondary | ICD-10-CM

## 2017-04-21 DIAGNOSIS — I10 Essential (primary) hypertension: Secondary | ICD-10-CM | POA: Insufficient documentation

## 2017-04-21 DIAGNOSIS — S069X3S Unspecified intracranial injury with loss of consciousness of 1 hour to 5 hours 59 minutes, sequela: Secondary | ICD-10-CM

## 2017-04-21 DIAGNOSIS — Z89512 Acquired absence of left leg below knee: Secondary | ICD-10-CM | POA: Insufficient documentation

## 2017-04-21 DIAGNOSIS — M6281 Muscle weakness (generalized): Secondary | ICD-10-CM

## 2017-04-21 DIAGNOSIS — E559 Vitamin D deficiency, unspecified: Secondary | ICD-10-CM | POA: Insufficient documentation

## 2017-04-21 DIAGNOSIS — G8929 Other chronic pain: Secondary | ICD-10-CM | POA: Insufficient documentation

## 2017-04-21 DIAGNOSIS — Z8781 Personal history of (healed) traumatic fracture: Secondary | ICD-10-CM

## 2017-04-21 DIAGNOSIS — J449 Chronic obstructive pulmonary disease, unspecified: Secondary | ICD-10-CM | POA: Insufficient documentation

## 2017-04-21 DIAGNOSIS — S143XXS Injury of brachial plexus, sequela: Secondary | ICD-10-CM

## 2017-04-21 DIAGNOSIS — E785 Hyperlipidemia, unspecified: Secondary | ICD-10-CM | POA: Insufficient documentation

## 2017-04-21 DIAGNOSIS — Z87891 Personal history of nicotine dependence: Secondary | ICD-10-CM | POA: Insufficient documentation

## 2017-04-21 NOTE — Patient Instructions (Signed)
PLEASE FEEL FREE TO CALL OUR OFFICE WITH ANY PROBLEMS OR QUESTIONS ([phone])      
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
## 2017-04-24 ENCOUNTER — Encounter: Payer: Self-pay | Admitting: Physical Therapy

## 2017-04-24 ENCOUNTER — Ambulatory Visit: Payer: PRIVATE HEALTH INSURANCE | Admitting: Physical Therapy

## 2017-04-24 DIAGNOSIS — M62838 Other muscle spasm: Secondary | ICD-10-CM

## 2017-04-24 DIAGNOSIS — M542 Cervicalgia: Secondary | ICD-10-CM

## 2017-04-24 DIAGNOSIS — M6281 Muscle weakness (generalized): Secondary | ICD-10-CM
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
## 2017-04-28 ENCOUNTER — Ambulatory Visit: Payer: PRIVATE HEALTH INSURANCE | Admitting: Physical Therapy

## 2017-04-28 ENCOUNTER — Encounter: Payer: Self-pay | Admitting: Physical Therapy

## 2017-04-28 DIAGNOSIS — M542 Cervicalgia: Secondary | ICD-10-CM | POA: Diagnosis not present

## 2017-04-28 DIAGNOSIS — M6281 Muscle weakness (generalized): Secondary | ICD-10-CM

## 2017-04-28 DIAGNOSIS — M62838 Other muscle spasm: Secondary | ICD-10-CM
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
## 2017-05-02 ENCOUNTER — Encounter: Payer: Self-pay | Admitting: Physical Therapy

## 2017-05-02 ENCOUNTER — Ambulatory Visit: Payer: PRIVATE HEALTH INSURANCE | Admitting: Physical Therapy

## 2017-05-02 DIAGNOSIS — M62838 Other muscle spasm: Secondary | ICD-10-CM

## 2017-05-02 DIAGNOSIS — M6281 Muscle weakness (generalized): Secondary | ICD-10-CM

## 2017-05-02 DIAGNOSIS — M542 Cervicalgia: Secondary | ICD-10-CM | POA: Diagnosis not present

## 2017-05-02 DIAGNOSIS — M6249 Contracture of muscle, multiple sites: Secondary | ICD-10-CM
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
## 2017-05-06 ENCOUNTER — Ambulatory Visit: Payer: PRIVATE HEALTH INSURANCE | Admitting: Physical Therapy

## 2017-05-06 DIAGNOSIS — M6281 Muscle weakness (generalized): Secondary | ICD-10-CM

## 2017-05-06 DIAGNOSIS — M542 Cervicalgia: Secondary | ICD-10-CM | POA: Diagnosis not present

## 2017-05-06 DIAGNOSIS — M62838 Other muscle spasm: Secondary | ICD-10-CM

## 2017-05-07 ENCOUNTER — Encounter: Payer: Self-pay | Admitting: Physical Therapy
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
## 2017-05-09 ENCOUNTER — Ambulatory Visit: Payer: PRIVATE HEALTH INSURANCE | Attending: Physician Assistant | Admitting: Physical Therapy

## 2017-05-09 ENCOUNTER — Encounter: Payer: Self-pay | Admitting: Physical Therapy

## 2017-05-09 DIAGNOSIS — M6249 Contracture of muscle, multiple sites: Secondary | ICD-10-CM | POA: Diagnosis present

## 2017-05-09 DIAGNOSIS — R2681 Unsteadiness on feet: Secondary | ICD-10-CM | POA: Insufficient documentation

## 2017-05-09 DIAGNOSIS — M542 Cervicalgia: Secondary | ICD-10-CM | POA: Diagnosis present

## 2017-05-09 DIAGNOSIS — M62838 Other muscle spasm: Secondary | ICD-10-CM | POA: Diagnosis present

## 2017-05-09 DIAGNOSIS — M6281 Muscle weakness (generalized): Secondary | ICD-10-CM | POA: Diagnosis present
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
## 2017-05-10 ENCOUNTER — Other Ambulatory Visit: Payer: Self-pay | Admitting: Physician Assistant

## 2017-05-13 ENCOUNTER — Encounter: Payer: Self-pay | Admitting: Physical Therapy

## 2017-05-13 ENCOUNTER — Ambulatory Visit: Payer: PRIVATE HEALTH INSURANCE | Admitting: Physical Therapy

## 2017-05-13 DIAGNOSIS — M62838 Other muscle spasm: Secondary | ICD-10-CM

## 2017-05-13 DIAGNOSIS — M542 Cervicalgia: Secondary | ICD-10-CM

## 2017-05-14 ENCOUNTER — Encounter: Payer: Self-pay | Admitting: Physician Assistant

## 2017-05-14 MED ORDER — ALBUTEROL SULFATE HFA 108 (90 BASE) MCG/ACT IN AERS: 2.0000 | INHALATION_SPRAY | Freq: Four times a day (QID) | RESPIRATORY_TRACT | 1 refills | Status: DC | PRN
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
## 2017-05-16 ENCOUNTER — Ambulatory Visit: Payer: PRIVATE HEALTH INSURANCE | Admitting: Physical Therapy

## 2017-05-16 ENCOUNTER — Encounter: Payer: Self-pay | Admitting: Physical Therapy

## 2017-05-16 DIAGNOSIS — M542 Cervicalgia: Secondary | ICD-10-CM

## 2017-05-16 DIAGNOSIS — M62838 Other muscle spasm: Secondary | ICD-10-CM

## 2017-05-16 DIAGNOSIS — M6281 Muscle weakness (generalized): Secondary | ICD-10-CM
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
## 2017-05-20 ENCOUNTER — Ambulatory Visit: Payer: PRIVATE HEALTH INSURANCE | Admitting: Physical Therapy

## 2017-05-20 ENCOUNTER — Ambulatory Visit: Payer: PRIVATE HEALTH INSURANCE | Admitting: Adult Health

## 2017-05-20 ENCOUNTER — Encounter: Payer: Self-pay | Admitting: Adult Health

## 2017-05-20 ENCOUNTER — Encounter: Payer: Self-pay | Admitting: Physician Assistant

## 2017-05-20 VITALS — BP 106/68 | HR 103 | Temp 97.9°F | Wt 225.0 lb

## 2017-05-20 DIAGNOSIS — M6281 Muscle weakness (generalized): Secondary | ICD-10-CM

## 2017-05-20 DIAGNOSIS — R140 Abdominal distension (gaseous): Secondary | ICD-10-CM

## 2017-05-20 DIAGNOSIS — M542 Cervicalgia: Secondary | ICD-10-CM | POA: Diagnosis not present

## 2017-05-20 DIAGNOSIS — R1010 Upper abdominal pain, unspecified: Secondary | ICD-10-CM

## 2017-05-20 DIAGNOSIS — K219 Gastro-esophageal reflux disease without esophagitis: Secondary | ICD-10-CM

## 2017-05-20 DIAGNOSIS — M62838 Other muscle spasm: Secondary | ICD-10-CM

## 2017-05-20 MED ORDER — DICYCLOMINE HCL 20 MG PO TABS: 20.0000 mg | ORAL_TABLET | Freq: Three times a day (TID) | ORAL | 0 refills | Status: DC | PRN
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
## 2017-05-20 NOTE — Patient Instructions (Signed)
Try gass-ex tonight; next nexium dose take both in evening. Try bland diet - push fluids.    Please go to the ER if you have any severe AB pain, unable to hold down food/water, blood in stool or vomit, chest pain, shortness of breath, or any worsening symptoms.   Abdominal Bloating When you have abdominal bloating, your abdomen may feel full, tight, or painful. It may also look bigger than normal or swollen (distended). Common causes of abdominal bloating include:  Swallowing air.  Constipation.  Problems digesting food.  Eating too much.  Irritable bowel syndrome. This is a condition that affects the large intestine.  Lactose intolerance. This is an inability to digest lactose, a natural sugar in dairy products.  Celiac disease. This is a condition that affects the ability to digest gluten, a protein found in some grains.  Gastroparesis. This is a condition that slows down the movement of food in the stomach and small intestine. It is more common in people with diabetes mellitus.  Gastroesophageal reflux disease (GERD). This is a digestive condition that makes stomach acid flow back into the esophagus.  Urinary retention. This means that the body is holding onto urine, and the bladder cannot be emptied all the way.  Follow these instructions at home: Eating and drinking  Avoid eating too much.  Try not to swallow air while talking or eating.  Avoid eating while lying down.  Avoid these foods and drinks: ? Foods that cause gas, such as broccoli, cabbage, cauliflower, and baked beans. ? Carbonated drinks. ? Hard candy. ? Chewing gum. Medicines  Take over-the-counter and prescription medicines only as told by your health care provider.  Take probiotic medicines. These medicines contain live bacteria or yeasts that can help digestion.  Take coated peppermint oil capsules. Activity  Try to exercise regularly. Exercise may help to relieve bloating that is caused by gas  and relieve constipation. General instructions  Keep all follow-up visits as told by your health care provider. This is important. Contact a health care provider if:  You have nausea and vomiting.  You have diarrhea.  You have abdominal pain.  You have unusual weight loss or weight gain.  You have severe pain, and medicines do not help. Get help right away if:  You have severe chest pain.  You have trouble breathing.  You have shortness of breath.  You have trouble urinating.  You have darker urine than normal.  You have blood in your stools or have dark, tarry stools. Summary  Abdominal bloating means that the abdomen is swollen.  Common causes of abdominal bloating are swallowing air, constipation, and problems digesting food.  Avoid eating too much and avoid swallowing air.  Avoid foods that cause gas, carbonated drinks, hard candy, and chewing gum. This information is not intended to replace advice given to you by your health care provider. Make sure you discuss any questions you have with your health care provider. Document Released: 07/26/2016 Document Revised: 07/26/2016 Document Reviewed: 07/26/2016 Elsevier Interactive Patient Education  [employer].
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
## 2017-05-22 ENCOUNTER — Ambulatory Visit
Admission: RE | Admit: 2017-05-22 | Discharge: 2017-05-22 | Disposition: A | Discharge status: Home / Self Care | Payer: PRIVATE HEALTH INSURANCE | Source: Ambulatory Visit | Attending: Adult Health | Admitting: Adult Health

## 2017-05-22 DIAGNOSIS — R1010 Upper abdominal pain, unspecified: Secondary | ICD-10-CM

## 2017-05-22 LAB — URINALYSIS W MICROSCOPIC + REFLEX CULTURE
Bacteria, UA: NONE SEEN /HPF
Bilirubin Urine: NEGATIVE
Glucose, UA: NEGATIVE
Hgb urine dipstick: NEGATIVE
Hyaline Cast: NONE SEEN /LPF
Nitrites, Initial: NEGATIVE
RBC / HPF: NONE SEEN /HPF (ref 0–2)
Specific Gravity, Urine: 1.034 (ref 1.001–1.03)
Squamous Epithelial / LPF: NONE SEEN /HPF (ref <=5)
WBC, UA: NONE SEEN /HPF (ref 0–5)
pH: 5.5 (ref 5.0–8.0)

## 2017-05-22 LAB — HEPATIC FUNCTION PANEL
AG Ratio: 2 (calc) (ref 1.0–2.5)
ALT: 549 U/L — ABNORMAL HIGH (ref 9–46)
AST: 412 U/L — ABNORMAL HIGH (ref 10–35)
Albumin: 4.5 g/dL (ref 3.6–5.1)
Alkaline phosphatase (APISO): 167 U/L — ABNORMAL HIGH (ref 40–115)
Bilirubin, Direct: 0.8 mg/dL — ABNORMAL HIGH (ref 0.0–0.2)
Globulin: 2.3 g/dL (calc) (ref 1.9–3.7)
Indirect Bilirubin: 0.7 mg/dL (calc) (ref 0.2–1.2)
Total Bilirubin: 1.5 mg/dL — ABNORMAL HIGH (ref 0.2–1.2)
Total Protein: 6.8 g/dL (ref 6.1–8.1)

## 2017-05-22 LAB — CBC WITH DIFFERENTIAL/PLATELET
Basophils Absolute: 24 cells/uL (ref 0–200)
Basophils Relative: 0.2 %
Eosinophils Absolute: 24 cells/uL (ref 15–500)
Eosinophils Relative: 0.2 %
HCT: 43 % (ref 38.5–50.0)
Hemoglobin: 15 g/dL (ref 13.2–17.1)
Lymphs Abs: 571 cells/uL — ABNORMAL LOW (ref 850–3900)
MCH: 29.5 pg (ref 27.0–33.0)
MCHC: 34.9 g/dL (ref 32.0–36.0)
MCV: 84.6 fL (ref 80.0–100.0)
MPV: 12.7 fL — ABNORMAL HIGH (ref 7.5–12.5)
Monocytes Relative: 3.6 %
Neutro Abs: 10853 cells/uL — ABNORMAL HIGH (ref 1500–7800)
Neutrophils Relative %: 91.2 %
Platelets: 146 10*3/uL (ref 140–400)
RBC: 5.08 10*6/uL (ref 4.20–5.80)
RDW: 13 % (ref 11.0–15.0)
Total Lymphocyte: 4.8 %
WBC mixed population: 428 cells/uL (ref 200–950)
WBC: 11.9 10*3/uL — ABNORMAL HIGH (ref 3.8–10.8)

## 2017-05-22 LAB — CULTURE INDICATED

## 2017-05-22 LAB — BASIC METABOLIC PANEL WITH GFR
BUN/Creatinine Ratio: 44 (calc) — ABNORMAL HIGH (ref 6–22)
BUN: 27 mg/dL — ABNORMAL HIGH (ref 7–25)
CO2: 29 mmol/L (ref 20–32)
Calcium: 9.4 mg/dL (ref 8.6–10.3)
Chloride: 101 mmol/L (ref 98–110)
Creat: 0.61 mg/dL — ABNORMAL LOW (ref 0.70–1.33)
GFR, Est African American: 128 mL/min/{1.73_m2} (ref >=60)
GFR, Est Non African American: 111 mL/min/{1.73_m2} (ref >=60)
Glucose, Bld: 110 mg/dL — ABNORMAL HIGH (ref 65–99)
Potassium: 4.3 mmol/L (ref 3.5–5.3)
Sodium: 138 mmol/L (ref 135–146)

## 2017-05-22 LAB — URINE CULTURE
MICRO NUMBER:: 81283519
Result:: NO GROWTH
SPECIMEN QUALITY:: ADEQUATE

## 2017-05-22 LAB — AMYLASE: Amylase: 72 U/L (ref 21–101)

## 2017-05-23 ENCOUNTER — Ambulatory Visit: Payer: PRIVATE HEALTH INSURANCE | Admitting: Physical Therapy

## 2017-05-23 DIAGNOSIS — R2681 Unsteadiness on feet: Secondary | ICD-10-CM

## 2017-05-23 DIAGNOSIS — M6281 Muscle weakness (generalized): Secondary | ICD-10-CM

## 2017-05-23 DIAGNOSIS — M542 Cervicalgia: Secondary | ICD-10-CM | POA: Diagnosis not present

## 2017-05-23 DIAGNOSIS — M62838 Other muscle spasm: Secondary | ICD-10-CM

## 2017-05-23 DIAGNOSIS — M6249 Contracture of muscle, multiple sites: Secondary | ICD-10-CM

## 2017-05-23 NOTE — Telephone Encounter (Signed)
Followed up via phone with patient's wife. Updated them that the ultrasound didn't show much but increased gas. Pt's wife states that he is doing much better and has a lot more relief. Will follow up if symptoms come back.

## 2017-05-26 ENCOUNTER — Ambulatory Visit: Payer: PRIVATE HEALTH INSURANCE | Admitting: Physical Therapy

## 2017-05-26 ENCOUNTER — Encounter: Payer: Self-pay | Admitting: Physical Therapy

## 2017-05-26 DIAGNOSIS — M542 Cervicalgia: Secondary | ICD-10-CM

## 2017-05-26 DIAGNOSIS — M62838 Other muscle spasm: Secondary | ICD-10-CM

## 2017-05-26 DIAGNOSIS — M6281 Muscle weakness (generalized): Secondary | ICD-10-CM
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
## 2017-05-27 ENCOUNTER — Ambulatory Visit: Payer: PRIVATE HEALTH INSURANCE | Admitting: Physical Therapy

## 2017-05-27 DIAGNOSIS — M6281 Muscle weakness (generalized): Secondary | ICD-10-CM

## 2017-05-27 DIAGNOSIS — M542 Cervicalgia: Secondary | ICD-10-CM

## 2017-05-27 DIAGNOSIS — M62838 Other muscle spasm: Secondary | ICD-10-CM
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
## 2017-06-03 ENCOUNTER — Ambulatory Visit: Payer: PRIVATE HEALTH INSURANCE | Admitting: Physical Therapy

## 2017-06-03 DIAGNOSIS — M62838 Other muscle spasm: Secondary | ICD-10-CM

## 2017-06-03 DIAGNOSIS — M542 Cervicalgia: Secondary | ICD-10-CM

## 2017-06-03 DIAGNOSIS — M6281 Muscle weakness (generalized): Secondary | ICD-10-CM
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
## 2017-06-06 ENCOUNTER — Ambulatory Visit: Payer: PRIVATE HEALTH INSURANCE | Admitting: Physical Therapy

## 2017-06-06 DIAGNOSIS — M542 Cervicalgia: Secondary | ICD-10-CM

## 2017-06-06 DIAGNOSIS — M6281 Muscle weakness (generalized): Secondary | ICD-10-CM

## 2017-06-06 DIAGNOSIS — M62838 Other muscle spasm: Secondary | ICD-10-CM
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
## 2017-06-10 ENCOUNTER — Other Ambulatory Visit: Payer: Self-pay | Admitting: Physician Assistant

## 2017-06-12 ENCOUNTER — Other Ambulatory Visit: Payer: Self-pay | Admitting: Physician Assistant

## 2017-06-12 ENCOUNTER — Ambulatory Visit: Payer: PRIVATE HEALTH INSURANCE | Admitting: Physician Assistant

## 2017-06-12 ENCOUNTER — Encounter: Payer: Self-pay | Admitting: Physician Assistant

## 2017-06-12 VITALS — BP 126/80 | HR 74 | Temp 97.7°F | Resp 16 | Ht 73.0 in | Wt 224.0 lb

## 2017-06-12 DIAGNOSIS — I700 Atherosclerosis of aorta: Secondary | ICD-10-CM | POA: Insufficient documentation

## 2017-06-12 DIAGNOSIS — R05 Cough: Secondary | ICD-10-CM

## 2017-06-12 DIAGNOSIS — R062 Wheezing: Secondary | ICD-10-CM

## 2017-06-12 DIAGNOSIS — R0602 Shortness of breath: Secondary | ICD-10-CM

## 2017-06-12 DIAGNOSIS — R059 Cough, unspecified: Secondary | ICD-10-CM

## 2017-06-12 MED ORDER — ESOMEPRAZOLE MAGNESIUM 40 MG PO CPDR: 40.0000 mg | DELAYED_RELEASE_CAPSULE | Freq: Every day | ORAL | 1 refills | Status: DC

## 2017-06-12 MED ORDER — BENZONATATE 100 MG PO CAPS: 100.0000 mg | ORAL_CAPSULE | Freq: Three times a day (TID) | ORAL | 0 refills | Status: DC | PRN

## 2017-06-12 MED ORDER — AZITHROMYCIN 250 MG PO TABS: ORAL_TABLET | ORAL | 1 refills | Status: AC

## 2017-06-12 MED ORDER — IPRATROPIUM-ALBUTEROL 0.5-2.5 (3) MG/3ML IN SOLN
3.0000 mL | Freq: Once | RESPIRATORY_TRACT | Status: AC
  Administered 2017-06-12: 3 mL via RESPIRATORY_TRACT

## 2017-06-12 MED ORDER — PREDNISONE 20 MG PO TABS: ORAL_TABLET | ORAL | 0 refills | Status: DC

## 2017-06-12 NOTE — Patient Instructions (Signed)
Can do steroid inhaler, NEED TO DO DAILY, this is NOT a rescue inhaler so if you are acutely short of breath please use your albuterol or call 911.  Do 1 puff once a day.  Do before you brush your teeth OR wash your mouth afterwards.  IF YOU DO NOT [place] YOUR MOUTH OUT IT CAN CAUSE YEAST Can do 2 tsp vinegar with water and switch to help prevent yeast or help yeast in your mouth.   Go to the ER if any chest pain, shortness of breath, nausea, dizziness, severe HA, changes vision/speech  Make sure you are on an allergy pill, see below for more details. Please take the prednisone as directed below, this is NOT an antibiotic so you do NOT have to finish it. You can take it for a few days and stop it if you are doing better.   Please take the prednisone to help decrease inflammation and therefore decrease symptoms. Take it it with food to avoid GI upset. It can cause increased energy but on the other hand it can make it hard to sleep at night so please take it AT [place], it takes 8-12 hours to start working so it will NOT affect your sleeping if you take it at night with your food!!  If you are diabetic it will increase your sugars so decrease carbs and monitor your sugars closely.     HOW TO TREAT VIRAL COUGH AND COLD SYMPTOMS:  -Symptoms usually last at least 1 week with the worst symptoms being around day 4.  - colds usually start with a sore throat and end with a cough, and the cough can take 2 weeks to get better.  -No antibiotics are needed for colds, flu, sore throats, cough, bronchitis UNLESS symptoms are longer than 7 days OR if you are getting better then get drastically worse.  -There are a lot of combination medications (Dayquil, Nyquil, Vicks 44, tyelnol cold and sinus, ETC). Please look at the ingredients on the back so that you are treating the correct symptoms and not doubling up on medications/ingredients.    Medicines you can use  Nasal congestion  - pseudoephedrine  (Sudafed)- behind the counter, do not use if you have high blood pressure, medicine that have -D in them.  - phenylephrine (Sudafed PE) -Dextormethorphan + chlorpheniramine (Coridcidin HBP)- okay if you have high blood pressure -Oxymetazoline (Afrin) nasal spray- LIMIT to 3 days -Saline nasal spray -Neti pot (used distilled or bottled water)  Ear pain/congestion  -pseudoephedrine (sudafed) - Nasonex/flonase nasal spray  Fever  -Acetaminophen (Tyelnol) -Ibuprofen (Advil, motrin, aleve)  Sore Throat  -Acetaminophen (Tyelnol) -Ibuprofen (Advil, motrin, aleve) -Drink a lot of water -Gargle with salt water - Rest your voice (don't talk) -Throat sprays -Cough drops  Body Aches  -Acetaminophen (Tyelnol) -Ibuprofen (Advil, motrin, aleve)  Headache  -Acetaminophen (Tyelnol) -Ibuprofen (Advil, motrin, aleve) - Exedrin, Exedrin Migraine  Allergy symptoms (cough, sneeze, runny nose, itchy eyes) -Claritin or loratadine cheapest but likely the weakest  -Zyrtec or certizine at night because it can make you sleepy -The strongest is allegra or fexafinadine  Cheapest at [employer], [employer], [employer]  Cough  -Dextromethorphan (Delsym)- medicine that has DM in it -Guafenesin (Mucinex/Robitussin) - cough drops - drink lots of water  Chest Congestion  -Guafenesin (Mucinex/Robitussin)  Red Itchy Eyes  - Naphcon-A  Upset Stomach  - Bland diet (nothing spicy, greasy, fried, and high acid foods like tomatoes, oranges, berries) -OKAY- cereal, bread, soup, crackers, rice -Eat  smaller more frequent meals -reduce caffeine, no alcohol -Loperamide (Imodium-AD) if diarrhea -Prevacid for heart burn  General health when sick  -Hydration -wash your hands frequently -keep surfaces clean -change pillow cases and sheets often -Get fresh air but do not exercise strenuously -Vitamin D, double up on it - Vitamin C -Zinc

## 2017-06-12 NOTE — Progress Notes (Signed)
Subjective:    Patient ID: [name], male    DOB: 1960/11/14, 56 y.o.   MRN: [id number]  HPI 56 y.o. WM presents with cough x 1 month. Wife is here with him. Has been using his albuterol inhaler 2-3 x a day, cough with green mucus, SOB, mild sinus, no fever, chills.   Blood pressure 126/80, pulse 74, temperature 97.7 F (36.5 C), resp. rate 16, height 6\' 1"  (1.854 m), weight 224 lb (101.6 kg), SpO2 97 %.  Medications Current Outpatient Medications on File Prior to Visit  Medication Sig  . albuterol (PROVENTIL HFA;VENTOLIN HFA) 108 (90 Base) MCG/ACT inhaler INHALE 2 PUFFS INTO THE LUNGS EVERY 6 HOURS AS NEEDED FOR WHEEZING/SHORTNESS OF BREATH  . aspirin 81 MG tablet Take 81 mg by mouth daily.  . cholecalciferol (VITAMIN D) 1000 units tablet Take 1 tablet (1,000 Units total) by mouth daily.  Marland Kitchen docusate sodium (COLACE) 100 MG capsule Take 1 capsule (100 mg total) by mouth 2 (two) times daily. (Patient taking differently: Take 100 mg by mouth 2 (two) times daily. Breakfast and dinner)  . esomeprazole (NEXIUM) 20 MG capsule Take 20 mg by mouth daily at 12 noon.  . loratadine (CLARITIN) 10 MG tablet Take 10 mg by mouth daily.  . metoprolol (LOPRESSOR) 50 MG tablet Take 1 tablet (50 mg total) by mouth 2 (two) times daily.  . Multiple Vitamin (MULTIVITAMIN) tablet Take 1 tablet by mouth daily.  . Omega-3 Fatty Acids (FISH OIL PO) Take by mouth daily.  . rosuvastatin (CRESTOR) 10 MG tablet Take 1 tablet (10 mg total) by mouth at bedtime.  Marland Kitchen venlafaxine XR (EFFEXOR-XR) 37.5 MG 24 hr capsule TAKE 1 CAPSULE DAILY  . vitamin C (ASCORBIC ACID) 500 MG tablet Take 500 mg by mouth daily.   No current facility-administered medications on file prior to visit.     Problem list He has Hyperlipidemia; GERD; Prediabetes; Vitamin D deficiency; Encounter for long-term (current) use of other medications; Injury of left vertebral artery; Bilateral pulmonary contusion; Acute respiratory failure ([place]); HTN  (hypertension); Brachial plexus injury; ETOH abuse; Traumatic bilateral lower extremity fractures; Painful orthopaedic hardware ([place]); Osteomyelitis ([place]); Acute osteomyelitis of left calcaneus ([place]); Chronic pain; Osteomyelitis of left leg (HCC); S/P BKA (below knee amputation) ([place]); Phantom limb pain ([place]); History of cervical fracture; and History of traumatic brain injury on their problem list.  Review of Systems  Constitutional: Negative for chills, diaphoresis and fever.  HENT: Positive for congestion and postnasal drip. Negative for ear pain, rhinorrhea, sinus pressure, sneezing, sore throat, trouble swallowing and voice change.   Eyes: Negative.   Respiratory: Positive for cough, shortness of breath and wheezing. Negative for chest tightness.   Cardiovascular: Negative.   Gastrointestinal: Negative.   Genitourinary: Negative.   Musculoskeletal: Negative.  Negative for neck pain.  Neurological: Negative.  Negative for headaches.       Objective:   Physical Exam  Constitutional: He is oriented to person, place, and time. He appears well-developed and well-nourished.  HENT:  Head: Normocephalic and atraumatic.  Right Ear: External ear normal.  Left Ear: External ear normal.  Nose: Nose normal.  Mouth/Throat: Oropharynx is clear and moist.  Eyes: Conjunctivae are normal. Pupils are equal, round, and reactive to light.  Neck: Normal range of motion. Neck supple.  Cardiovascular: Normal rate, regular rhythm and normal heart sounds.  No murmur heard. Pulmonary/Chest: Effort normal. No respiratory distress. He has wheezes (left lower lobe). He has no rales. He exhibits no  tenderness.  Abdominal: Soft. Bowel sounds are normal.  Lymphadenopathy:    He has no cervical adenopathy.  Neurological: He is alert and oriented to person, place, and time.  Skin: Skin is warm and dry.       Assessment & Plan:   Cough -     ipratropium-albuterol (DUONEB) 0.5-2.5 (3) MG/3ML nebulizer  solution 3 mL -     esomeprazole (NEXIUM) 40 MG capsule; Take 1 capsule (40 mg total) by mouth daily at 12 noon. -     azithromycin (ZITHROMAX) 250 MG tablet; Take 2 tablets (500 mg) on  Day 1,  followed by 1 tablet (250 mg) once daily on Days 2 through 5. -     predniSONE (DELTASONE) 20 MG tablet; 2 tablets daily for 3 days, 1 tablet daily for 4 days. -     benzonatate (TESSALON PERLES) 100 MG capsule; Take 1 capsule (100 mg total) by mouth 3 (three) times daily as needed for cough (Max: 600mg  per day).  The patient was advised to call immediately if he has any concerning symptoms in the interval. The patient voices understanding of current treatment options and is in agreement with the current care plan.The patient knows to call the clinic with any problems, questions or concerns or go to the ER if any further progression of symptoms.

## 2017-06-16 ENCOUNTER — Encounter: Payer: Self-pay | Admitting: Physician Assistant

## 2017-06-18 ENCOUNTER — Encounter: Payer: Self-pay | Admitting: Physical Medicine & Rehabilitation

## 2017-06-18 ENCOUNTER — Encounter
Payer: PRIVATE HEALTH INSURANCE | Attending: Physical Medicine & Rehabilitation | Admitting: Physical Medicine & Rehabilitation

## 2017-06-18 VITALS — BP 144/70 | HR 69

## 2017-06-18 DIAGNOSIS — I10 Essential (primary) hypertension: Secondary | ICD-10-CM | POA: Diagnosis not present

## 2017-06-18 DIAGNOSIS — M542 Cervicalgia: Secondary | ICD-10-CM | POA: Diagnosis not present

## 2017-06-18 DIAGNOSIS — G546 Phantom limb syndrome with pain: Secondary | ICD-10-CM

## 2017-06-18 DIAGNOSIS — Z89512 Acquired absence of left leg below knee: Secondary | ICD-10-CM | POA: Insufficient documentation

## 2017-06-18 DIAGNOSIS — Z87820 Personal history of traumatic brain injury: Secondary | ICD-10-CM

## 2017-06-18 DIAGNOSIS — G8929 Other chronic pain: Secondary | ICD-10-CM | POA: Diagnosis not present

## 2017-06-18 DIAGNOSIS — K219 Gastro-esophageal reflux disease without esophagitis: Secondary | ICD-10-CM | POA: Insufficient documentation

## 2017-06-18 DIAGNOSIS — R51 Headache: Secondary | ICD-10-CM | POA: Insufficient documentation

## 2017-06-18 DIAGNOSIS — E785 Hyperlipidemia, unspecified: Secondary | ICD-10-CM | POA: Insufficient documentation

## 2017-06-18 DIAGNOSIS — J449 Chronic obstructive pulmonary disease, unspecified: Secondary | ICD-10-CM | POA: Insufficient documentation

## 2017-06-18 DIAGNOSIS — G243 Spasmodic torticollis: Secondary | ICD-10-CM | POA: Diagnosis not present

## 2017-06-18 DIAGNOSIS — R7303 Prediabetes: Secondary | ICD-10-CM | POA: Diagnosis not present

## 2017-06-18 DIAGNOSIS — Z87891 Personal history of nicotine dependence: Secondary | ICD-10-CM | POA: Diagnosis not present

## 2017-06-18 DIAGNOSIS — S069X0S Unspecified intracranial injury without loss of consciousness, sequela: Secondary | ICD-10-CM | POA: Insufficient documentation

## 2017-06-18 DIAGNOSIS — E559 Vitamin D deficiency, unspecified: Secondary | ICD-10-CM | POA: Insufficient documentation

## 2017-06-18 NOTE — Patient Instructions (Addendum)
  CONTINUE TO WORK ON YOUR POSTURE AND YOUR NECK RANGE OF MOTION.   PLEASE FEEL FREE TO CALL OUR OFFICE WITH ANY PROBLEMS OR QUESTIONS ([phone])  HAVE A HAPPY HOLIDAYS!                     ^                  ^^                ^ ^ ^             ^ ^ ^ ^ ^           ^ ^ ^ ^ ^ ^ ^        ^ ^ ^ ^ [place] Marland Kitchen                Marland Kitchen                Marland Kitchen                Marland Kitchen
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
## 2017-07-18 ENCOUNTER — Encounter: Payer: Self-pay | Admitting: Physician Assistant

## 2017-07-18 ENCOUNTER — Other Ambulatory Visit: Payer: Self-pay | Admitting: Physician Assistant

## 2017-07-18 ENCOUNTER — Ambulatory Visit (HOSPITAL_COMMUNITY)
Admission: RE | Admit: 2017-07-18 | Discharge: 2017-07-18 | Disposition: A | Discharge status: Home / Self Care | Payer: PRIVATE HEALTH INSURANCE | Source: Ambulatory Visit | Attending: Physician Assistant | Admitting: Physician Assistant

## 2017-07-18 DIAGNOSIS — R05 Cough: Secondary | ICD-10-CM | POA: Diagnosis present

## 2017-07-18 DIAGNOSIS — R918 Other nonspecific abnormal finding of lung field: Secondary | ICD-10-CM | POA: Insufficient documentation

## 2017-07-18 DIAGNOSIS — R059 Cough, unspecified: Secondary | ICD-10-CM

## 2017-07-18 MED ORDER — ALBUTEROL SULFATE HFA 108 (90 BASE) MCG/ACT IN AERS: INHALATION_SPRAY | RESPIRATORY_TRACT | 3 refills | Status: DC

## 2017-07-18 MED ORDER — AZELASTINE HCL 0.1 % NA SOLN: 2.0000 | Freq: Two times a day (BID) | NASAL | 2 refills | Status: DC

## 2017-07-18 MED ORDER — PROMETHAZINE-DM 6.25-15 MG/5ML PO SYRP: 5.0000 mL | ORAL_SOLUTION | Freq: Four times a day (QID) | ORAL | 1 refills | Status: DC | PRN

## 2017-07-18 MED ORDER — PREDNISONE 20 MG PO TABS: ORAL_TABLET | ORAL | 0 refills | Status: DC

## 2017-07-21 ENCOUNTER — Encounter: Payer: Self-pay | Admitting: Physician Assistant

## 2017-07-21 ENCOUNTER — Other Ambulatory Visit: Payer: Self-pay | Admitting: Physician Assistant

## 2017-07-21 ENCOUNTER — Ambulatory Visit (HOSPITAL_COMMUNITY)
Admission: RE | Admit: 2017-07-21 | Discharge: 2017-07-21 | Disposition: A | Discharge status: Home / Self Care | Payer: PRIVATE HEALTH INSURANCE | Source: Ambulatory Visit | Attending: Physician Assistant | Admitting: Physician Assistant

## 2017-07-21 DIAGNOSIS — R059 Cough, unspecified: Secondary | ICD-10-CM

## 2017-07-21 DIAGNOSIS — R05 Cough: Secondary | ICD-10-CM

## 2017-07-24 ENCOUNTER — Encounter: Payer: Self-pay | Admitting: Physician Assistant
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
## 2017-07-25 ENCOUNTER — Ambulatory Visit: Payer: PRIVATE HEALTH INSURANCE | Admitting: Physician Assistant

## 2017-07-25 ENCOUNTER — Encounter: Payer: Self-pay | Admitting: Physician Assistant

## 2017-07-25 VITALS — BP 122/80 | HR 63 | Temp 97.5°F | Resp 14 | Ht 73.0 in | Wt 229.2 lb

## 2017-07-25 DIAGNOSIS — E782 Mixed hyperlipidemia: Secondary | ICD-10-CM

## 2017-07-25 DIAGNOSIS — Z8739 Personal history of other diseases of the musculoskeletal system and connective tissue: Secondary | ICD-10-CM | POA: Diagnosis not present

## 2017-07-25 DIAGNOSIS — I700 Atherosclerosis of aorta: Secondary | ICD-10-CM

## 2017-07-25 DIAGNOSIS — J449 Chronic obstructive pulmonary disease, unspecified: Secondary | ICD-10-CM | POA: Diagnosis not present

## 2017-07-25 DIAGNOSIS — Z89512 Acquired absence of left leg below knee: Secondary | ICD-10-CM

## 2017-07-25 DIAGNOSIS — I158 Other secondary hypertension: Secondary | ICD-10-CM | POA: Diagnosis not present

## 2017-07-25 LAB — LIPID PANEL
Cholesterol: 166 mg/dL (ref <200)
HDL: 47 mg/dL (ref >40)
LDL Cholesterol (Calc): 87 mg/dL (calc)
Non-HDL Cholesterol (Calc): 119 mg/dL (calc) (ref <130)
Total CHOL/HDL Ratio: 3.5 (calc) (ref <5.0)
Triglycerides: 219 mg/dL — ABNORMAL HIGH (ref <150)

## 2017-07-25 LAB — HEPATIC FUNCTION PANEL
AG Ratio: 2.3 (calc) (ref 1.0–2.5)
ALT: 37 U/L (ref 9–46)
AST: 23 U/L (ref 10–35)
Albumin: 4.2 g/dL (ref 3.6–5.1)
Alkaline phosphatase (APISO): 68 U/L (ref 40–115)
Bilirubin, Direct: 0.1 mg/dL (ref 0.0–0.2)
Globulin: 1.8 g/dL (calc) — ABNORMAL LOW (ref 1.9–3.7)
Indirect Bilirubin: 0.3 mg/dL (calc) (ref 0.2–1.2)
Total Bilirubin: 0.4 mg/dL (ref 0.2–1.2)
Total Protein: 6 g/dL — ABNORMAL LOW (ref 6.1–8.1)

## 2017-07-25 MED ORDER — UMECLIDINIUM-VILANTEROL 62.5-25 MCG/INH IN AEPB: 1.0000 | INHALATION_SPRAY | Freq: Every day | RESPIRATORY_TRACT | 0 refills | Status: DC

## 2017-07-25 NOTE — Patient Instructions (Signed)
Can do steroid inhaler, NEED TO DO DAILY, this is NOT a rescue inhaler so if you are acutely short of breath please use your albuterol or call 911.  Do 1 puff once a day.  Do before you brush your teeth OR wash your mouth afterwards.  IF YOU DO NOT [place] YOUR MOUTH OUT IT CAN CAUSE YEAST Can do 2 tsp vinegar with water and switch to help prevent yeast or help yeast in your mouth.   Go to the ER if any chest pain, shortness of breath, nausea, dizziness, severe HA, changes vision/speech

## 2017-08-08 ENCOUNTER — Encounter: Payer: Self-pay | Admitting: Physician Assistant

## 2017-08-08 DIAGNOSIS — J449 Chronic obstructive pulmonary disease, unspecified: Secondary | ICD-10-CM

## 2017-08-08 MED ORDER — UMECLIDINIUM-VILANTEROL 62.5-25 MCG/INH IN AEPB: 1.0000 | INHALATION_SPRAY | Freq: Every day | RESPIRATORY_TRACT | 4 refills | Status: DC

## 2017-08-11 ENCOUNTER — Other Ambulatory Visit: Payer: Self-pay | Admitting: Physician Assistant

## 2017-09-08 ENCOUNTER — Other Ambulatory Visit: Payer: Self-pay | Admitting: *Deleted

## 2017-09-08 DIAGNOSIS — G546 Phantom limb syndrome with pain: Secondary | ICD-10-CM

## 2017-09-08 DIAGNOSIS — I10 Essential (primary) hypertension: Secondary | ICD-10-CM

## 2017-09-08 DIAGNOSIS — Z89512 Acquired absence of left leg below knee: Secondary | ICD-10-CM

## 2017-09-08 DIAGNOSIS — S143XXS Injury of brachial plexus, sequela: Secondary | ICD-10-CM

## 2017-09-08 DIAGNOSIS — S069X3S Unspecified intracranial injury with loss of consciousness of 1 hour to 5 hours 59 minutes, sequela: Secondary | ICD-10-CM

## 2017-09-08 MED ORDER — METOPROLOL TARTRATE 50 MG PO TABS: 50.0000 mg | ORAL_TABLET | Freq: Two times a day (BID) | ORAL | 5 refills | Status: DC

## 2017-10-11 ENCOUNTER — Other Ambulatory Visit: Payer: Self-pay | Admitting: Physician Assistant

## 2017-11-11 ENCOUNTER — Other Ambulatory Visit: Payer: Self-pay

## 2017-11-11 ENCOUNTER — Encounter: Payer: Self-pay | Admitting: Physical Therapy

## 2017-11-11 ENCOUNTER — Ambulatory Visit: Payer: PRIVATE HEALTH INSURANCE | Attending: Internal Medicine | Admitting: Physical Therapy

## 2017-11-11 DIAGNOSIS — R2681 Unsteadiness on feet: Secondary | ICD-10-CM | POA: Diagnosis present

## 2017-11-11 DIAGNOSIS — R2689 Other abnormalities of gait and mobility: Secondary | ICD-10-CM | POA: Insufficient documentation
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
## 2017-11-19 ENCOUNTER — Encounter: Payer: Self-pay | Admitting: Physical Therapy

## 2017-11-19 ENCOUNTER — Ambulatory Visit: Payer: PRIVATE HEALTH INSURANCE | Admitting: Physical Therapy

## 2017-11-19 DIAGNOSIS — R2681 Unsteadiness on feet: Secondary | ICD-10-CM

## 2017-11-19 DIAGNOSIS — R2689 Other abnormalities of gait and mobility: Secondary | ICD-10-CM
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
## 2017-12-13 ENCOUNTER — Other Ambulatory Visit: Payer: Self-pay | Admitting: Internal Medicine

## 2017-12-13 ENCOUNTER — Other Ambulatory Visit: Payer: Self-pay | Admitting: Physician Assistant

## 2017-12-24 ENCOUNTER — Encounter: Payer: Self-pay | Admitting: Physician Assistant

## 2017-12-24 ENCOUNTER — Other Ambulatory Visit: Payer: Self-pay | Admitting: Internal Medicine

## 2017-12-24 MED ORDER — CITALOPRAM HYDROBROMIDE 20 MG PO TABS: ORAL_TABLET | ORAL | 0 refills | Status: DC

## 2017-12-25 ENCOUNTER — Other Ambulatory Visit: Payer: Self-pay | Admitting: Physician Assistant

## 2017-12-25 DIAGNOSIS — J449 Chronic obstructive pulmonary disease, unspecified: Secondary | ICD-10-CM

## 2018-01-06 ENCOUNTER — Telehealth: Payer: Self-pay

## 2018-01-06 NOTE — Telephone Encounter (Signed)
Pt reports that he going on vacation next wk- not due to refill inhaler until the 15th of July.   Pt was informed to pick up samples of TRELEGY. To try for a few wks and if this helps we will change his meds.   Pt's wife agreed to pick up meds and voiced understanding as well.  July 2nd 2019 at 2pm.

## 2018-01-20 ENCOUNTER — Other Ambulatory Visit: Payer: Self-pay | Admitting: Internal Medicine

## 2018-01-20 ENCOUNTER — Encounter: Payer: Self-pay | Admitting: Internal Medicine

## 2018-01-20 MED ORDER — FLUTICASONE-UMECLIDIN-VILANT 100-62.5-25 MCG/INH IN AEPB: 1.0000 | INHALATION_SPRAY | Freq: Every day | RESPIRATORY_TRACT | 3 refills | Status: DC

## 2018-02-28 ENCOUNTER — Other Ambulatory Visit: Payer: Self-pay | Admitting: Internal Medicine

## 2018-03-03 ENCOUNTER — Other Ambulatory Visit: Payer: Self-pay | Admitting: Internal Medicine

## 2018-03-03 MED ORDER — ROSUVASTATIN CALCIUM 10 MG PO TABS: ORAL_TABLET | ORAL | 3 refills | Status: DC
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
## 2018-03-27 ENCOUNTER — Encounter: Payer: Self-pay | Admitting: Physician Assistant

## 2018-03-27 ENCOUNTER — Ambulatory Visit (INDEPENDENT_AMBULATORY_CARE_PROVIDER_SITE_OTHER): Payer: PRIVATE HEALTH INSURANCE | Admitting: Physician Assistant

## 2018-03-27 VITALS — BP 108/72 | HR 88 | Temp 97.9°F | Resp 18 | Ht 73.0 in | Wt 231.6 lb

## 2018-03-27 DIAGNOSIS — I10 Essential (primary) hypertension: Secondary | ICD-10-CM

## 2018-03-27 DIAGNOSIS — Z0000 Encounter for general adult medical examination without abnormal findings: Secondary | ICD-10-CM

## 2018-03-27 DIAGNOSIS — Z1329 Encounter for screening for other suspected endocrine disorder: Secondary | ICD-10-CM

## 2018-03-27 DIAGNOSIS — T8484XA Pain due to internal orthopedic prosthetic devices, implants and grafts, initial encounter: Secondary | ICD-10-CM

## 2018-03-27 DIAGNOSIS — F1021 Alcohol dependence, in remission: Secondary | ICD-10-CM

## 2018-03-27 DIAGNOSIS — K219 Gastro-esophageal reflux disease without esophagitis: Secondary | ICD-10-CM

## 2018-03-27 DIAGNOSIS — G243 Spasmodic torticollis: Secondary | ICD-10-CM

## 2018-03-27 DIAGNOSIS — Z136 Encounter for screening for cardiovascular disorders: Secondary | ICD-10-CM | POA: Diagnosis not present

## 2018-03-27 DIAGNOSIS — J449 Chronic obstructive pulmonary disease, unspecified: Secondary | ICD-10-CM

## 2018-03-27 DIAGNOSIS — E559 Vitamin D deficiency, unspecified: Secondary | ICD-10-CM

## 2018-03-27 DIAGNOSIS — Z79899 Other long term (current) drug therapy: Secondary | ICD-10-CM

## 2018-03-27 DIAGNOSIS — Z87820 Personal history of traumatic brain injury: Secondary | ICD-10-CM

## 2018-03-27 DIAGNOSIS — S143XXS Injury of brachial plexus, sequela: Secondary | ICD-10-CM

## 2018-03-27 DIAGNOSIS — E782 Mixed hyperlipidemia: Secondary | ICD-10-CM

## 2018-03-27 DIAGNOSIS — Z23 Encounter for immunization: Secondary | ICD-10-CM | POA: Diagnosis not present

## 2018-03-27 DIAGNOSIS — Z1389 Encounter for screening for other disorder: Secondary | ICD-10-CM

## 2018-03-27 DIAGNOSIS — G8929 Other chronic pain: Secondary | ICD-10-CM

## 2018-03-27 DIAGNOSIS — R7303 Prediabetes: Secondary | ICD-10-CM

## 2018-03-27 DIAGNOSIS — Z131 Encounter for screening for diabetes mellitus: Secondary | ICD-10-CM

## 2018-03-27 DIAGNOSIS — I158 Other secondary hypertension: Secondary | ICD-10-CM

## 2018-03-27 DIAGNOSIS — I700 Atherosclerosis of aorta: Secondary | ICD-10-CM

## 2018-03-27 DIAGNOSIS — G546 Phantom limb syndrome with pain: Secondary | ICD-10-CM

## 2018-03-27 DIAGNOSIS — Z89512 Acquired absence of left leg below knee: Secondary | ICD-10-CM

## 2018-03-27 DIAGNOSIS — Z8739 Personal history of other diseases of the musculoskeletal system and connective tissue: Secondary | ICD-10-CM

## 2018-03-27 DIAGNOSIS — Z8781 Personal history of (healed) traumatic fracture: Secondary | ICD-10-CM

## 2018-03-27 DIAGNOSIS — Z13220 Encounter for screening for lipoid disorders: Secondary | ICD-10-CM

## 2018-03-27 MED ORDER — CYCLOBENZAPRINE HCL 10 MG PO TABS: 10.0000 mg | ORAL_TABLET | Freq: Every evening | ORAL | 0 refills | Status: DC | PRN

## 2018-03-27 MED ORDER — AMITRIPTYLINE HCL 10 MG PO TABS: ORAL_TABLET | ORAL | 0 refills | Status: DC

## 2018-03-27 NOTE — Patient Instructions (Addendum)
For you neck, may continue PT as needed and the dry needling  Suggest trying over the counter salonpas patches  And will do a muscle relaxer you can take at night  Will also try amitriptyline- we are doing a VERY low dose of this medication 10mg  TAke AS NEEDED at first for the headache Can increase to up to 3 at at time Max dose of this medication a day is 150-200mg  TOTAL and he is on 10mg  See how he does  May try trigger point in the office DURING a headache if we can find a spot   Trigger Point Injection Trigger points are areas where you have pain. A trigger point injection is a shot given in the trigger point to help relieve pain for a few days to a few months. Common places for trigger points include:  The neck.  The shoulders.  The upper back.  The lower back.  A trigger point injection will not cure long-lasting (chronic) pain permanently. These injections do not always work for every person, but for some people they can help to relieve pain for a few days to a few months. Tell a health care provider about:  Any allergies you have.  All medicines you are taking, including vitamins, herbs, eye drops, creams, and over-the-counter medicines.  Any problems you or family members have had with anesthetic medicines.  Any blood disorders you have.  Any surgeries you have had.  Any medical conditions you have. What are the risks? Generally, this is a safe procedure. However, problems may occur, including:  Infection.  Bleeding.  Allergic reaction to the injected medicine.  Irritation of the skin around the injection site.  What happens before the procedure?  Ask your health care provider about changing or stopping your regular medicines. This is especially important if you are taking diabetes medicines or blood thinners. What happens during the procedure?  Your health care provider will feel for trigger points. A marker may be used to circle the area for the  injection.  The skin over the trigger point will be washed with a germ-killing (antiseptic) solution.  A thin needle is used for the shot. You may feel pain or a twitching feeling when the needle enters the trigger point.  A numbing solution may be injected into the trigger point. Sometimes a medicine to keep down swelling, redness, and warmth (inflammation) is also injected.  Your health care provider may move the needle around the area where the trigger point is located until the tightness and twitching goes away.  After the injection, your health care provider may put gentle pressure over the injection site.  The injection site will be covered with a bandage (dressing). The procedure may vary among health care providers and hospitals. What happens after the procedure?  The dressing can be taken off in a few hours or as told by your health care provider.  You may feel sore and stiff for 1-2 days. This information is not intended to replace advice given to you by your health care provider. Make sure you discuss any questions you have with your health care provider. Document Released: 06/13/2011 Document Revised: 02/25/2016 Document Reviewed: 12/12/2014 Elsevier Interactive Patient Education  2018 [employer].

## 2018-03-28 LAB — COMPLETE METABOLIC PANEL WITH GFR
AG Ratio: 2.5 (calc) (ref 1.0–2.5)
ALT: 39 U/L (ref 9–46)
AST: 23 U/L (ref 10–35)
Albumin: 4.7 g/dL (ref 3.6–5.1)
Alkaline phosphatase (APISO): 71 U/L (ref 40–115)
BUN/Creatinine Ratio: 28 (calc) — ABNORMAL HIGH (ref 6–22)
BUN: 19 mg/dL (ref 7–25)
CO2: 28 mmol/L (ref 20–32)
Calcium: 9.5 mg/dL (ref 8.6–10.3)
Chloride: 103 mmol/L (ref 98–110)
Creat: 0.67 mg/dL — ABNORMAL LOW (ref 0.70–1.33)
GFR, Est African American: 123 mL/min/{1.73_m2} (ref >=60)
GFR, Est Non African American: 106 mL/min/{1.73_m2} (ref >=60)
Globulin: 1.9 g/dL (calc) (ref 1.9–3.7)
Glucose, Bld: 88 mg/dL (ref 65–99)
Potassium: 4.5 mmol/L (ref 3.5–5.3)
Sodium: 139 mmol/L (ref 135–146)
Total Bilirubin: 0.4 mg/dL (ref 0.2–1.2)
Total Protein: 6.6 g/dL (ref 6.1–8.1)

## 2018-03-28 LAB — LIPID PANEL
Cholesterol: 197 mg/dL (ref <200)
HDL: 45 mg/dL (ref >40)
LDL Cholesterol (Calc): 110 mg/dL (calc) — ABNORMAL HIGH
Non-HDL Cholesterol (Calc): 152 mg/dL (calc) — ABNORMAL HIGH (ref <130)
Total CHOL/HDL Ratio: 4.4 (calc) (ref <5.0)
Triglycerides: 317 mg/dL — ABNORMAL HIGH (ref <150)

## 2018-03-28 LAB — CBC WITH DIFFERENTIAL/PLATELET
Basophils Absolute: 49 cells/uL (ref 0–200)
Basophils Relative: 0.6 %
Eosinophils Absolute: 271 cells/uL (ref 15–500)
Eosinophils Relative: 3.3 %
HCT: 44.3 % (ref 38.5–50.0)
Hemoglobin: 15.2 g/dL (ref 13.2–17.1)
Lymphs Abs: 2140 cells/uL (ref 850–3900)
MCH: 29.5 pg (ref 27.0–33.0)
MCHC: 34.3 g/dL (ref 32.0–36.0)
MCV: 86 fL (ref 80.0–100.0)
MPV: 12.4 fL (ref 7.5–12.5)
Monocytes Relative: 6.6 %
Neutro Abs: 5199 cells/uL (ref 1500–7800)
Neutrophils Relative %: 63.4 %
Platelets: 199 10*3/uL (ref 140–400)
RBC: 5.15 10*6/uL (ref 4.20–5.80)
RDW: 13.3 % (ref 11.0–15.0)
Total Lymphocyte: 26.1 %
WBC mixed population: 541 cells/uL (ref 200–950)
WBC: 8.2 10*3/uL (ref 3.8–10.8)

## 2018-03-28 LAB — URINALYSIS, ROUTINE W REFLEX MICROSCOPIC
Bilirubin Urine: NEGATIVE
Glucose, UA: NEGATIVE
Hgb urine dipstick: NEGATIVE
Ketones, ur: NEGATIVE
Leukocytes, UA: NEGATIVE
Nitrite: NEGATIVE
Protein, ur: NEGATIVE
Specific Gravity, Urine: 1.021 (ref 1.001–1.03)
pH: 7 (ref 5.0–8.0)

## 2018-03-28 LAB — HEMOGLOBIN A1C
Hgb A1c MFr Bld: 6 % of total Hgb — ABNORMAL HIGH (ref <5.7)
Mean Plasma Glucose: 126 (calc)
eAG (mmol/L): 7 (calc)

## 2018-03-28 LAB — MICROALBUMIN / CREATININE URINE RATIO
Creatinine, Urine: 72 mg/dL (ref 20–320)
Microalb Creat Ratio: 4 mcg/mg creat (ref <30)
Microalb, Ur: 0.3 mg/dL

## 2018-03-28 LAB — TSH: TSH: 1.19 mIU/L (ref 0.40–4.50)

## 2018-05-17 ENCOUNTER — Other Ambulatory Visit: Payer: Self-pay | Admitting: Physician Assistant

## 2018-05-17 MED ORDER — CITALOPRAM HYDROBROMIDE 20 MG PO TABS: ORAL_TABLET | ORAL | 1 refills | Status: DC

## 2018-05-18 MED ORDER — FLUTICASONE-UMECLIDIN-VILANT 100-62.5-25 MCG/INH IN AEPB: 1.0000 | INHALATION_SPRAY | Freq: Every day | RESPIRATORY_TRACT | 3 refills | Status: DC

## 2018-05-29 ENCOUNTER — Other Ambulatory Visit: Payer: Self-pay | Admitting: Internal Medicine

## 2018-05-29 MED ORDER — AZITHROMYCIN 250 MG PO TABS: ORAL_TABLET | ORAL | 1 refills | Status: DC

## 2018-05-29 MED ORDER — PREDNISONE 20 MG PO TABS: ORAL_TABLET | ORAL | 0 refills | Status: DC

## 2018-06-08 ENCOUNTER — Other Ambulatory Visit: Payer: Self-pay | Admitting: Internal Medicine

## 2018-07-14 ENCOUNTER — Encounter: Payer: Self-pay | Admitting: Adult Health

## 2018-07-14 ENCOUNTER — Ambulatory Visit (HOSPITAL_COMMUNITY)
Admission: RE | Admit: 2018-07-14 | Discharge: 2018-07-14 | Disposition: A | Discharge status: Home / Self Care | Payer: PRIVATE HEALTH INSURANCE | Source: Ambulatory Visit | Attending: Adult Health | Admitting: Adult Health

## 2018-07-14 ENCOUNTER — Ambulatory Visit (INDEPENDENT_AMBULATORY_CARE_PROVIDER_SITE_OTHER): Payer: PRIVATE HEALTH INSURANCE | Admitting: Adult Health

## 2018-07-14 VITALS — BP 120/80 | HR 70 | Temp 97.5°F | Ht 73.0 in | Wt 236.0 lb

## 2018-07-14 DIAGNOSIS — J441 Chronic obstructive pulmonary disease with (acute) exacerbation: Secondary | ICD-10-CM

## 2018-07-14 DIAGNOSIS — R0989 Other specified symptoms and signs involving the circulatory and respiratory systems: Secondary | ICD-10-CM

## 2018-07-14 MED ORDER — ALBUTEROL SULFATE (2.5 MG/3ML) 0.083% IN NEBU: 2.5000 mg | INHALATION_SOLUTION | RESPIRATORY_TRACT | 12 refills | Status: DC | PRN

## 2018-07-14 MED ORDER — LEVOFLOXACIN 750 MG PO TABS: 750.0000 mg | ORAL_TABLET | Freq: Every day | ORAL | 0 refills | Status: AC

## 2018-07-14 MED ORDER — PROMETHAZINE-DM 6.25-15 MG/5ML PO SYRP: 5.0000 mL | ORAL_SOLUTION | Freq: Four times a day (QID) | ORAL | 1 refills | Status: DC | PRN

## 2018-07-14 MED ORDER — PREDNISONE 20 MG PO TABS: ORAL_TABLET | ORAL | 0 refills | Status: DC
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
## 2018-07-14 NOTE — Patient Instructions (Signed)
Please go to the ER for any worsening symptoms, if breathing is getting worse  Call if not improving  Chronic Obstructive Pulmonary Disease Exacerbation Chronic obstructive pulmonary disease (COPD) is a long-term (chronic) lung problem. In COPD, the flow of air from the lungs is limited. COPD exacerbations are times that breathing gets worse and you need more than your normal treatment. Without treatment, they can be life threatening. If they happen often, your lungs can become more damaged. If your COPD gets worse, your doctor may treat you with:  Medicines.  Oxygen.  Different ways to clear your airway, such as using a mask. Follow these instructions at home: Medicines  Take over-the-counter and prescription medicines only as told by your doctor.  If you take an antibiotic or steroid medicine, do not stop taking the medicine even if you start to feel better.  Keep up with shots (vaccinations) as told by your doctor. Be sure to get a yearly (annual) flu shot. Lifestyle  Do not smoke. If you need help quitting, ask your doctor.  Eat healthy foods.  Exercise regularly.  Get plenty of sleep.  Avoid tobacco smoke and other things that can bother your lungs.  Wash your hands often with soap and water. This will help keep you from getting an infection. If you cannot use soap and water, use hand sanitizer.  During flu season, avoid areas that are crowded with people. General instructions  Drink enough fluid to keep your pee (urine) clear or pale yellow. Do not do this if your doctor has told you not to.  Use a cool mist machine (vaporizer).  If you use oxygen or a machine that turns medicine into a mist (nebulizer), continue to use it as told.  Follow all instructions for rehabilitation. These are steps you can take to make your body work better.  Keep all follow-up visits as told by your doctor. This is important. Contact a doctor if:  Your COPD symptoms get worse than  normal. Get help right away if:  You are short of breath and it gets worse.  You have trouble talking.  You have chest pain.  You cough up blood.  You have a fever.  You keep throwing up (vomiting).  You feel weak or you pass out (faint).  You feel confused.  You are not able to sleep because of your symptoms.  You are not able to do daily activities. Summary  COPD exacerbations are times that breathing gets worse and you need more treatment than normal.  COPD exacerbations can be very serious and may cause your lungs to become more damaged.  Do not smoke. If you need help quitting, ask your doctor.  Stay up-to-date on your shots. Get a flu shot every year. This information is not intended to replace advice given to you by your health care provider. Make sure you discuss any questions you have with your health care provider. Document Released: 06/13/2011 Document Revised: 07/29/2016 Document Reviewed: 07/29/2016 Elsevier Interactive Patient Education  2019 [employer].

## 2018-07-16 NOTE — Telephone Encounter (Signed)
Patient's wife, [name] called to request order be faxed to [facility] for patient purchase of DME Nebulizer. Declined Nebulizer from [facility],. Cheaper to buy at [facility]. Faxed order to [facility]. All questions answered, no need for provider to respond back to patient. Patient picked up nebulizer medicines at local pharmacy.

## 2018-07-25 ENCOUNTER — Other Ambulatory Visit: Payer: Self-pay | Admitting: Internal Medicine

## 2018-07-25 DIAGNOSIS — G546 Phantom limb syndrome with pain: Secondary | ICD-10-CM

## 2018-07-25 DIAGNOSIS — I10 Essential (primary) hypertension: Secondary | ICD-10-CM

## 2018-07-25 DIAGNOSIS — S143XXS Injury of brachial plexus, sequela: Secondary | ICD-10-CM

## 2018-07-25 DIAGNOSIS — S069X3S Unspecified intracranial injury with loss of consciousness of 1 hour to 5 hours 59 minutes, sequela: Secondary | ICD-10-CM

## 2018-07-25 DIAGNOSIS — Z89512 Acquired absence of left leg below knee: Secondary | ICD-10-CM
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
## 2018-07-31 ENCOUNTER — Encounter: Payer: Self-pay | Admitting: Physician Assistant

## 2018-07-31 ENCOUNTER — Ambulatory Visit (INDEPENDENT_AMBULATORY_CARE_PROVIDER_SITE_OTHER): Payer: PRIVATE HEALTH INSURANCE | Admitting: Physician Assistant

## 2018-07-31 VITALS — BP 120/86 | HR 76 | Temp 97.7°F | Wt 236.4 lb

## 2018-07-31 DIAGNOSIS — I158 Other secondary hypertension: Secondary | ICD-10-CM | POA: Diagnosis not present

## 2018-07-31 DIAGNOSIS — E782 Mixed hyperlipidemia: Secondary | ICD-10-CM

## 2018-07-31 DIAGNOSIS — Z79899 Other long term (current) drug therapy: Secondary | ICD-10-CM

## 2018-07-31 DIAGNOSIS — I700 Atherosclerosis of aorta: Secondary | ICD-10-CM

## 2018-07-31 DIAGNOSIS — R7303 Prediabetes: Secondary | ICD-10-CM | POA: Diagnosis not present

## 2018-07-31 DIAGNOSIS — J449 Chronic obstructive pulmonary disease, unspecified: Secondary | ICD-10-CM | POA: Diagnosis not present

## 2018-07-31 MED ORDER — IPRATROPIUM BROMIDE 0.02 % IN SOLN: 0.5000 mg | RESPIRATORY_TRACT | 2 refills | Status: DC | PRN

## 2018-07-31 MED ORDER — NYSTATIN 100000 UNIT/ML MT SUSP: OROMUCOSAL | 0 refills | Status: DC

## 2018-07-31 NOTE — Patient Instructions (Addendum)
Nebulizer machine- try it 1-2 x a day every day for a month with the trelegy once a day The medicine with the nebulizer will be less expensive the the rescure inhaler Wait 4 hours between the nebulizer and the rescue inhaler or it can speed up your heart rate Your lungs still don;t sound great, do above but if still sounds bad next time may suggest CT lung to get a better look rather than a chest xray that you have in Jan 2020.   You are starting to have some redness/yeast on your throat Try apple cider vinegar 1-2 TSP with water gargling after the trelegy inhaler If the mouth wash is too expensive just tell pharmacy to keep and try above  Stop the soda at night for your sugars  Your LDL could improve, ideally we want it under a 100.  Your LDL is the bad cholesterol that can lead to heart attack and stroke. To lower your number you can decrease your fatty foods, red meat, cheese, milk and increase fiber like whole grains and veggies. You can also add a fiber supplement like Citracel or Benefiber, these do not cause gas and bloating and are safe to use. Especially if you have a strong family history of heart disease or stroke or you have evidence of plaque on any imaging like a chest xray, we may discuss at your next office visit putting you on a medication to get your number below 100.   Your LDL was 110 last time, will check.   Never during the winter but eventually you can cut celexa in half or stop if you are doing well BUT always be open to your friends and family if they feel you should go back on it.       When it comes to diets, agreement about the perfect plan isn't easy to find, even among the experts. Experts at the [employer] developed an idea known as the Healthy Eating Plate. Just imagine a plate divided into logical, healthy portions.  The emphasis is on diet quality:  Load up on vegetables and fruits - one-half of your plate: Aim for color and variety,  and remember that potatoes don't count.  Go for whole grains - one-quarter of your plate: Whole wheat, barley, wheat berries, quinoa, oats, brown rice, and foods made with them. If you want pasta, go with whole wheat pasta.  Protein power - one-quarter of your plate: Fish, chicken, beans, and nuts are all healthy, versatile protein sources. Limit red meat.  The diet, however, does go beyond the plate, offering a few other suggestions.  Use healthy plant oils, such as olive, canola, soy, corn, sunflower and peanut. Check the labels, and avoid partially hydrogenated oil, which have unhealthy trans fats.  If you're thirsty, drink water. Coffee and tea are good in moderation, but skip sugary drinks and limit milk and dairy products to one or two daily servings.  The type of carbohydrate in the diet is more important than the amount. Some sources of carbohydrates, such as vegetables, fruits, whole grains, and beans-are healthier than others.  Finally, stay active.

## 2018-08-01 LAB — CBC WITH DIFFERENTIAL/PLATELET
Absolute Monocytes: 713 cells/uL (ref 200–950)
Basophils Absolute: 57 cells/uL (ref 0–200)
Basophils Relative: 0.6 %
Eosinophils Absolute: 209 cells/uL (ref 15–500)
Eosinophils Relative: 2.2 %
HCT: 46.5 % (ref 38.5–50.0)
Hemoglobin: 15.5 g/dL (ref 13.2–17.1)
Lymphs Abs: 2071 cells/uL (ref 850–3900)
MCH: 28.9 pg (ref 27.0–33.0)
MCHC: 33.3 g/dL (ref 32.0–36.0)
MCV: 86.6 fL (ref 80.0–100.0)
MPV: 12.4 fL (ref 7.5–12.5)
Monocytes Relative: 7.5 %
Neutro Abs: 6451 cells/uL (ref 1500–7800)
Neutrophils Relative %: 67.9 %
Platelets: 201 10*3/uL (ref 140–400)
RBC: 5.37 10*6/uL (ref 4.20–5.80)
RDW: 13.2 % (ref 11.0–15.0)
Total Lymphocyte: 21.8 %
WBC: 9.5 10*3/uL (ref 3.8–10.8)

## 2018-08-01 LAB — COMPLETE METABOLIC PANEL WITH GFR
AG Ratio: 1.7 (calc) (ref 1.0–2.5)
ALT: 66 U/L — ABNORMAL HIGH (ref 9–46)
AST: 35 U/L (ref 10–35)
Albumin: 4.3 g/dL (ref 3.6–5.1)
Alkaline phosphatase (APISO): 60 U/L (ref 40–115)
BUN: 21 mg/dL (ref 7–25)
CO2: 26 mmol/L (ref 20–32)
Calcium: 9.8 mg/dL (ref 8.6–10.3)
Chloride: 105 mmol/L (ref 98–110)
Creat: 0.82 mg/dL (ref 0.70–1.33)
GFR, Est African American: 113 mL/min/{1.73_m2} (ref >=60)
GFR, Est Non African American: 97 mL/min/{1.73_m2} (ref >=60)
Globulin: 2.5 g/dL (calc) (ref 1.9–3.7)
Glucose, Bld: 93 mg/dL (ref 65–99)
Potassium: 4.6 mmol/L (ref 3.5–5.3)
Sodium: 140 mmol/L (ref 135–146)
Total Bilirubin: 0.4 mg/dL (ref 0.2–1.2)
Total Protein: 6.8 g/dL (ref 6.1–8.1)

## 2018-08-01 LAB — LIPID PANEL
Cholesterol: 196 mg/dL (ref <200)
HDL: 42 mg/dL (ref >40)
LDL Cholesterol (Calc): 117 mg/dL (calc) — ABNORMAL HIGH
Non-HDL Cholesterol (Calc): 154 mg/dL (calc) — ABNORMAL HIGH (ref <130)
Total CHOL/HDL Ratio: 4.7 (calc) (ref <5.0)
Triglycerides: 260 mg/dL — ABNORMAL HIGH (ref <150)

## 2018-08-01 LAB — TSH: TSH: 1.32 mIU/L (ref 0.40–4.50)

## 2018-08-01 LAB — HEMOGLOBIN A1C
Hgb A1c MFr Bld: 6.2 % of total Hgb — ABNORMAL HIGH (ref <5.7)
Mean Plasma Glucose: 131 (calc)
eAG (mmol/L): 7.3 (calc)

## 2018-08-07 ENCOUNTER — Other Ambulatory Visit: Payer: Self-pay | Admitting: Physician Assistant

## 2018-08-22 ENCOUNTER — Other Ambulatory Visit: Payer: Self-pay | Admitting: Internal Medicine

## 2018-09-03 ENCOUNTER — Other Ambulatory Visit: Payer: Self-pay | Admitting: Physician Assistant

## 2018-09-22 IMAGING — CT CT HEAD W/O CM
3 series · 15 of 47 positions shown, 18 images · non-contrast
Comparison: None.

CLINICAL DATA: 55 y/o M; altered mental status. 04/13/2016 MRI
brain. 04/06/2016 CT head.

EXAM:
CT HEAD WITHOUT CONTRAST
TECHNIQUE: Contiguous axial images were obtained from the base of the skull
through the vertex without intravenous contrast.

[Series 2: head 5.0 h30s · axial · 0.44mm/px · z∈[-169,-29]mm · 9 of 34 slices shown, 12 images]
[im 3/34  brain]
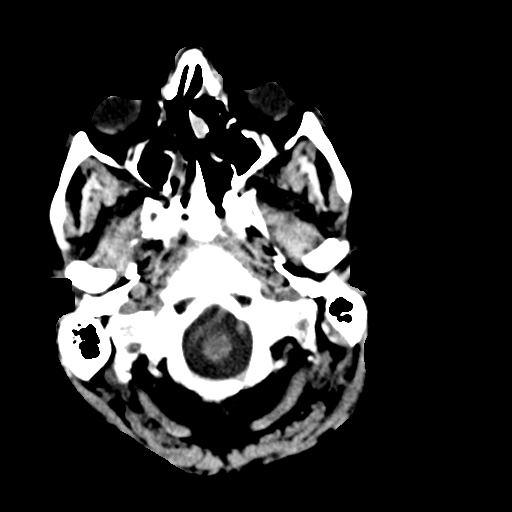
[im 3/34  bone]
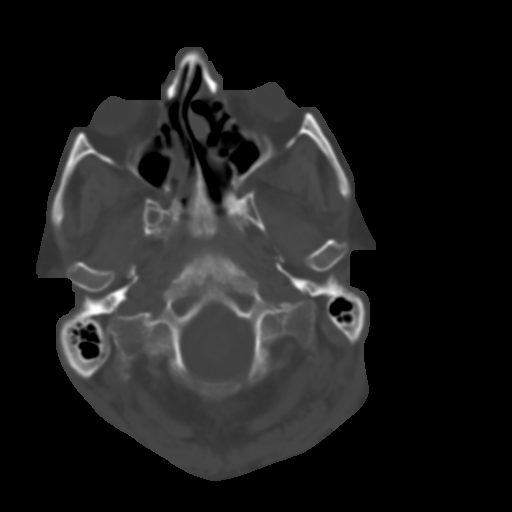
[im 6/34  brain]
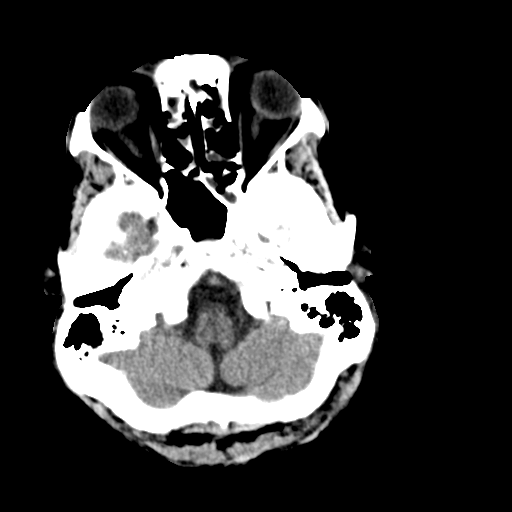
[im 10/34  brain]
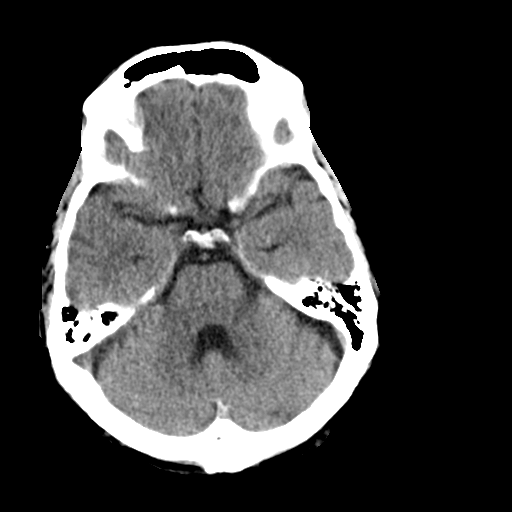
[im 13/34  brain]
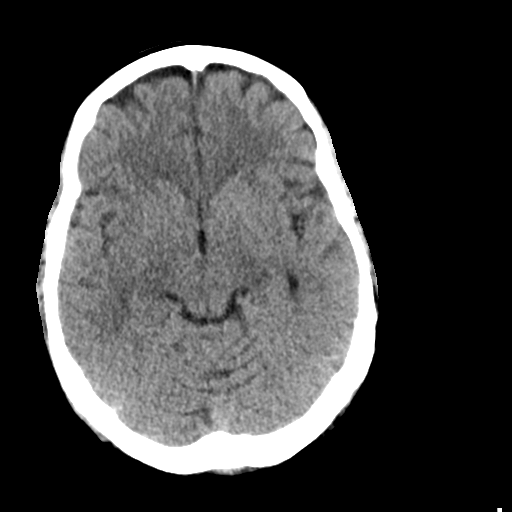
[im 18/34  brain]
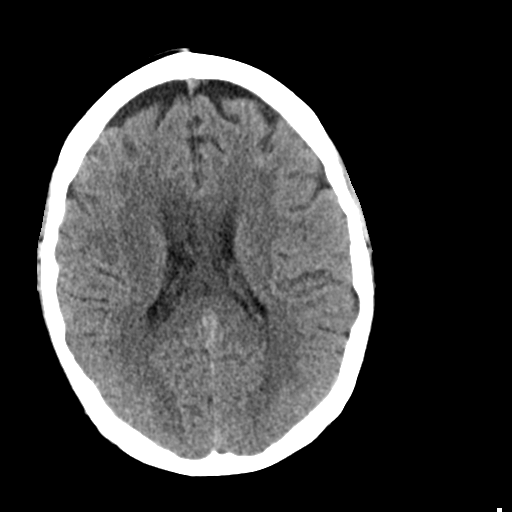
[im 18/34  bone]
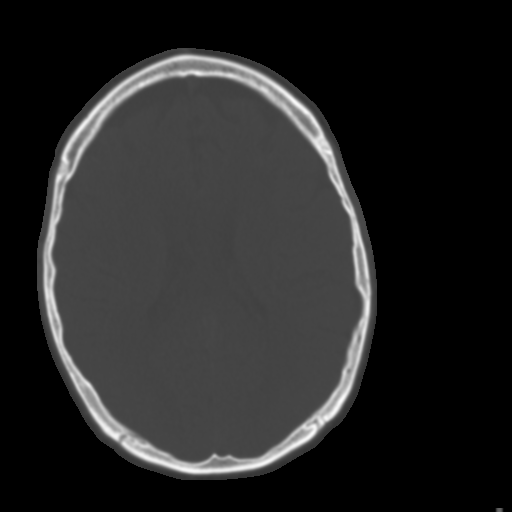
[im 21/34  brain]
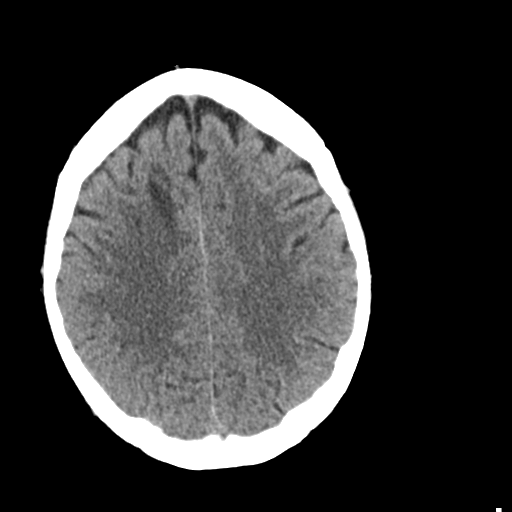
[im 24/34  brain]
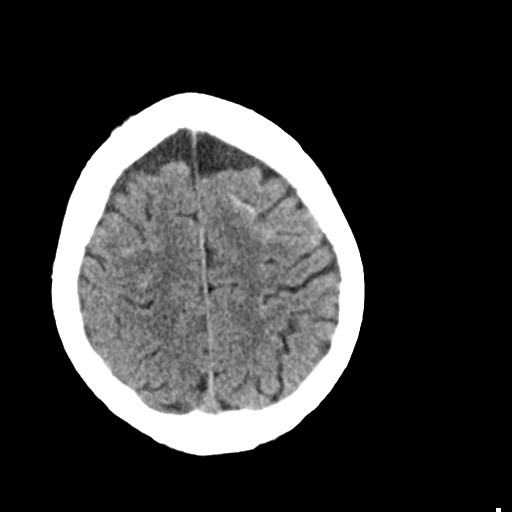
[im 28/34  brain]
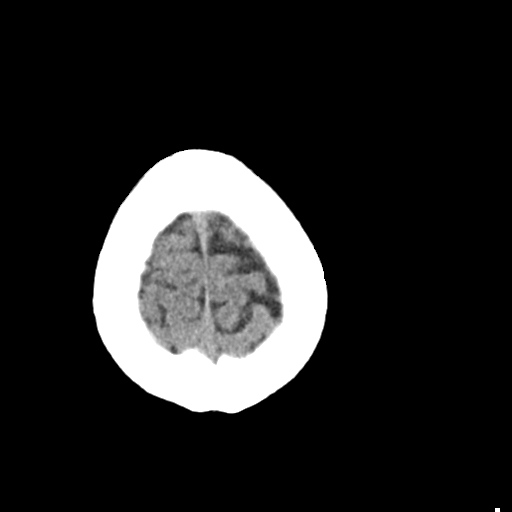
[im 31/34  brain]
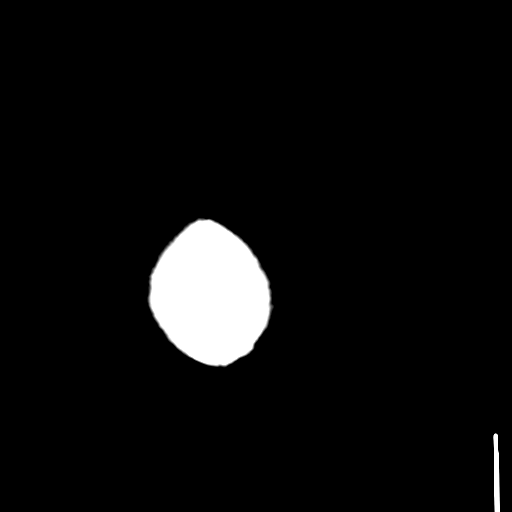
[im 31/34  bone]
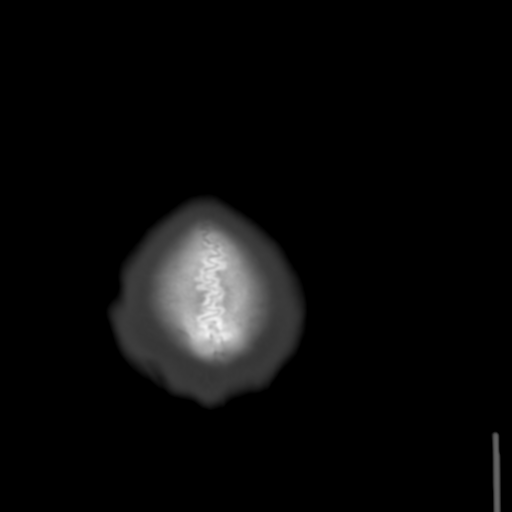

[Series 4: head 3.0 mpr · coronal · 0.35mm/px · 3 of 75 slices shown (1 of 2)]
[im 25/75  brain]
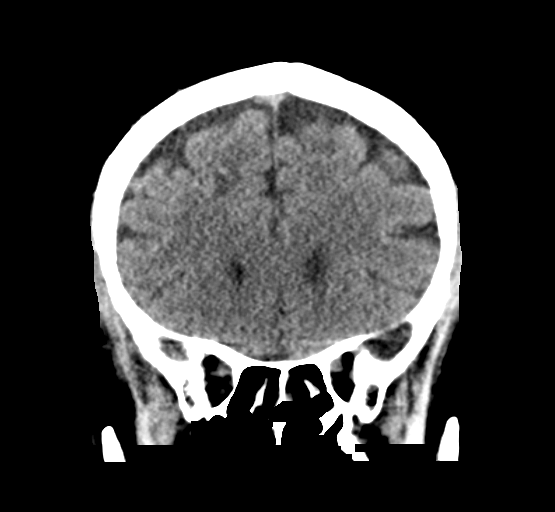
[im 33/75  brain]
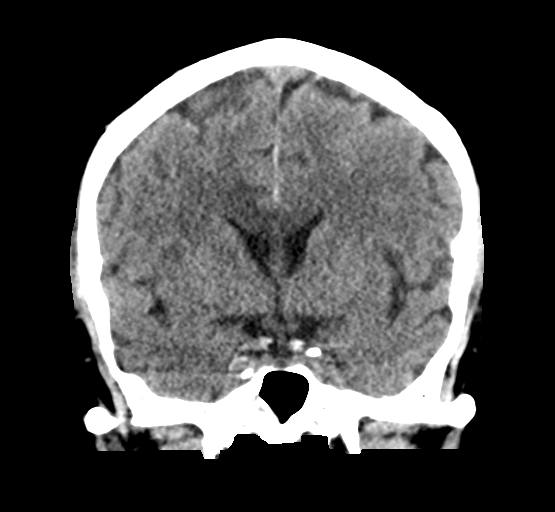
[im 42/75  brain]
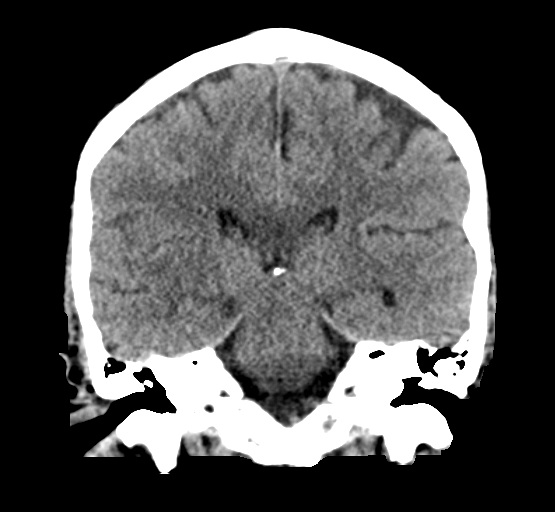

[Series 5: head 3.0 mpr · sagittal · 0.36mm/px · 3 of 67 slices shown (2 of 2)]
[im 23/67  brain]
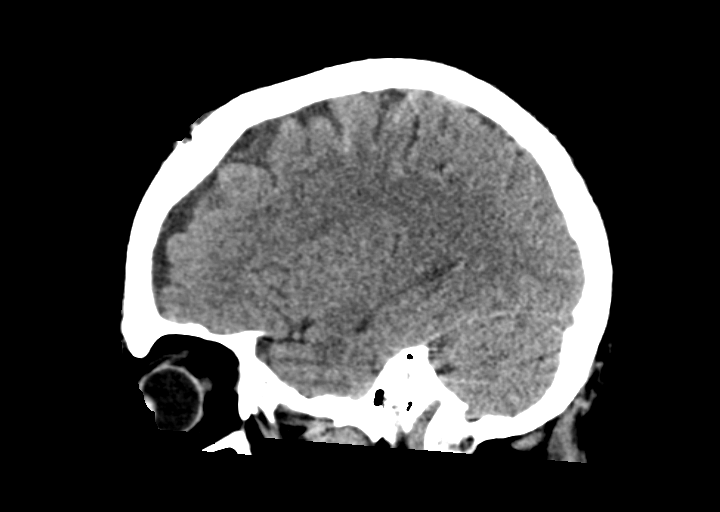
[im 34/67  brain]
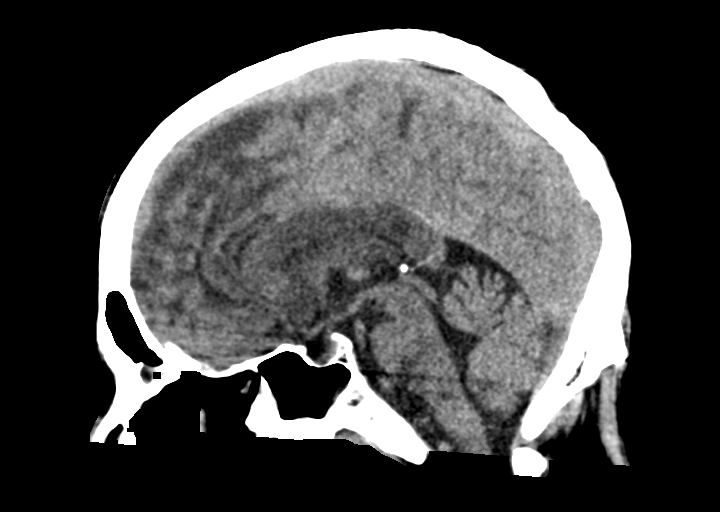
[im 45/67  brain]
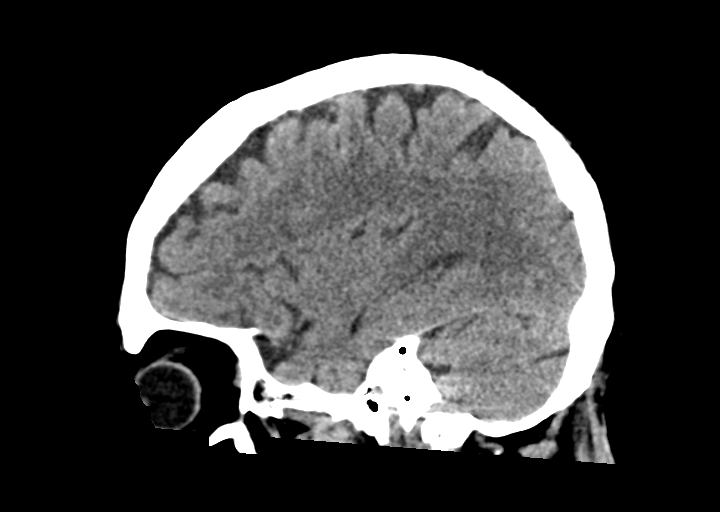

[15 of 47 positions shown; findings below may reference images not displayed]

FINDINGS: Brain: Interval partial dispersion of subarachnoid hemorrhage over
the convexities. Minimal residual subdural hemorrhage along the
falx. Stable trace hemorrhage within left occipital horn of frontal
ventricle. Linear hypoattenuation traversing the right frontal lobe
along the course of removed ventriculostomy tract catheter. No
hydrocephalus. No effacement of basilar cisterns. No evidence of new
large territory infarct or new brain parenchymal hemorrhage.

Vascular: No hyperdense vessel. Calcific atherosclerosis of
cavernous internal carotid arteries.

Skull: Interval resolution of edema and air in the right frontal
scalp from intraventricular drain placement. No displaced calvarial
fracture.

Sinuses/Orbits: Diffuse paranasal sinus mucosal thickening and
patchy opacification of ethmoid air cells. Orbits are unremarkable.
Mastoid air cells are clear.

Other: None.
IMPRESSION: 1. Lucency traversing right frontal lobe along course of removed
ventriculostomy catheter probably representing edema and gliosis.
Density at the brain surface subjacent to the burr hole probably
represents minimal blood products.
2. Interval partial dispersal of subarachnoid hemorrhage. Stable
minimal hemorrhage along the falx and within the left lateral
ventricle. Otherwise no new intracranial hemorrhage is identified.
3. No hydrocephalus or effacement of basilar cisterns.
4. Paranasal sinus disease improved from prior CT.

By: Frantz Gasner Assema M.D.

## 2018-10-03 ENCOUNTER — Other Ambulatory Visit: Payer: Self-pay | Admitting: Adult Health

## 2018-10-03 DIAGNOSIS — S143XXS Injury of brachial plexus, sequela: Secondary | ICD-10-CM

## 2018-10-03 DIAGNOSIS — G546 Phantom limb syndrome with pain: Secondary | ICD-10-CM

## 2018-10-03 DIAGNOSIS — I10 Essential (primary) hypertension: Secondary | ICD-10-CM

## 2018-10-03 DIAGNOSIS — S069X3S Unspecified intracranial injury with loss of consciousness of 1 hour to 5 hours 59 minutes, sequela: Secondary | ICD-10-CM

## 2018-10-03 DIAGNOSIS — Z89512 Acquired absence of left leg below knee: Secondary | ICD-10-CM

## 2018-10-04 ENCOUNTER — Other Ambulatory Visit: Payer: Self-pay | Admitting: Physician Assistant

## 2018-10-04 DIAGNOSIS — S069X3S Unspecified intracranial injury with loss of consciousness of 1 hour to 5 hours 59 minutes, sequela: Secondary | ICD-10-CM

## 2018-10-04 DIAGNOSIS — Z89512 Acquired absence of left leg below knee: Secondary | ICD-10-CM

## 2018-10-04 DIAGNOSIS — S143XXS Injury of brachial plexus, sequela: Secondary | ICD-10-CM

## 2018-10-04 DIAGNOSIS — G546 Phantom limb syndrome with pain: Secondary | ICD-10-CM

## 2018-10-04 DIAGNOSIS — I10 Essential (primary) hypertension: Secondary | ICD-10-CM

## 2018-10-04 MED ORDER — METOPROLOL TARTRATE 50 MG PO TABS: ORAL_TABLET | ORAL | 3 refills | Status: DC

## 2018-10-17 MED ORDER — ROSUVASTATIN CALCIUM 10 MG PO TABS: ORAL_TABLET | ORAL | 3 refills | Status: DC
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
## 2018-10-30 ENCOUNTER — Encounter: Payer: Self-pay | Admitting: Physician Assistant

## 2018-10-30 ENCOUNTER — Ambulatory Visit: Payer: Self-pay | Admitting: Physician Assistant

## 2018-10-30 ENCOUNTER — Other Ambulatory Visit: Payer: Self-pay

## 2018-10-30 ENCOUNTER — Ambulatory Visit: Payer: Medicare Other | Admitting: Physician Assistant

## 2018-10-30 VITALS — BP 132/81 | HR 57

## 2018-10-30 DIAGNOSIS — J449 Chronic obstructive pulmonary disease, unspecified: Secondary | ICD-10-CM

## 2018-10-30 DIAGNOSIS — R7309 Other abnormal glucose: Secondary | ICD-10-CM

## 2018-10-30 DIAGNOSIS — E782 Mixed hyperlipidemia: Secondary | ICD-10-CM | POA: Diagnosis not present

## 2018-10-30 DIAGNOSIS — Z89512 Acquired absence of left leg below knee: Secondary | ICD-10-CM

## 2018-10-30 DIAGNOSIS — F1021 Alcohol dependence, in remission: Secondary | ICD-10-CM

## 2018-10-30 DIAGNOSIS — I158 Other secondary hypertension: Secondary | ICD-10-CM | POA: Diagnosis not present

## 2018-10-30 DIAGNOSIS — I700 Atherosclerosis of aorta: Secondary | ICD-10-CM | POA: Diagnosis not present

## 2018-11-02 ENCOUNTER — Other Ambulatory Visit: Payer: Self-pay

## 2018-11-03 ENCOUNTER — Other Ambulatory Visit: Payer: Self-pay | Admitting: Adult Health

## 2018-12-01 ENCOUNTER — Other Ambulatory Visit: Payer: Self-pay | Admitting: Adult Health

## 2018-12-01 MED ORDER — ESOMEPRAZOLE MAGNESIUM 40 MG PO CPDR: DELAYED_RELEASE_CAPSULE | ORAL | 1 refills | Status: DC

## 2019-01-22 DIAGNOSIS — K6389 Other specified diseases of intestine: Secondary | ICD-10-CM | POA: Diagnosis not present

## 2019-01-22 DIAGNOSIS — R1032 Left lower quadrant pain: Secondary | ICD-10-CM | POA: Diagnosis not present

## 2019-01-22 DIAGNOSIS — N289 Disorder of kidney and ureter, unspecified: Secondary | ICD-10-CM | POA: Diagnosis not present

## 2019-01-22 DIAGNOSIS — K4090 Unilateral inguinal hernia, without obstruction or gangrene, not specified as recurrent: Secondary | ICD-10-CM | POA: Diagnosis not present

## 2019-01-22 DIAGNOSIS — K8020 Calculus of gallbladder without cholecystitis without obstruction: Secondary | ICD-10-CM | POA: Diagnosis not present

## 2019-01-22 DIAGNOSIS — K3580 Unspecified acute appendicitis: Secondary | ICD-10-CM | POA: Diagnosis not present

## 2019-01-22 DIAGNOSIS — N2889 Other specified disorders of kidney and ureter: Secondary | ICD-10-CM | POA: Diagnosis not present

## 2019-01-23 DIAGNOSIS — N2889 Other specified disorders of kidney and ureter: Secondary | ICD-10-CM | POA: Diagnosis not present

## 2019-01-23 DIAGNOSIS — K4090 Unilateral inguinal hernia, without obstruction or gangrene, not specified as recurrent: Secondary | ICD-10-CM | POA: Diagnosis not present

## 2019-01-23 DIAGNOSIS — R1032 Left lower quadrant pain: Secondary | ICD-10-CM | POA: Diagnosis not present

## 2019-01-23 DIAGNOSIS — K8020 Calculus of gallbladder without cholecystitis without obstruction: Secondary | ICD-10-CM | POA: Diagnosis not present
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
## 2019-02-04 ENCOUNTER — Other Ambulatory Visit: Payer: Self-pay

## 2019-02-04 ENCOUNTER — Encounter: Payer: Self-pay | Admitting: Physician Assistant

## 2019-02-04 ENCOUNTER — Ambulatory Visit (INDEPENDENT_AMBULATORY_CARE_PROVIDER_SITE_OTHER): Payer: Medicare Other | Admitting: Physician Assistant

## 2019-02-04 VITALS — BP 124/72 | HR 62 | Temp 97.3°F | Ht 73.0 in | Wt 239.6 lb

## 2019-02-04 DIAGNOSIS — R7309 Other abnormal glucose: Secondary | ICD-10-CM

## 2019-02-04 DIAGNOSIS — N2889 Other specified disorders of kidney and ureter: Secondary | ICD-10-CM

## 2019-02-04 DIAGNOSIS — I700 Atherosclerosis of aorta: Secondary | ICD-10-CM | POA: Diagnosis not present

## 2019-02-04 DIAGNOSIS — I158 Other secondary hypertension: Secondary | ICD-10-CM | POA: Diagnosis not present

## 2019-02-04 DIAGNOSIS — R6889 Other general symptoms and signs: Secondary | ICD-10-CM | POA: Diagnosis not present

## 2019-02-04 DIAGNOSIS — Z0001 Encounter for general adult medical examination with abnormal findings: Secondary | ICD-10-CM

## 2019-02-04 DIAGNOSIS — N281 Cyst of kidney, acquired: Secondary | ICD-10-CM | POA: Insufficient documentation

## 2019-02-04 DIAGNOSIS — Z0000 Encounter for general adult medical examination without abnormal findings: Secondary | ICD-10-CM

## 2019-02-04 DIAGNOSIS — E559 Vitamin D deficiency, unspecified: Secondary | ICD-10-CM

## 2019-02-04 DIAGNOSIS — E782 Mixed hyperlipidemia: Secondary | ICD-10-CM | POA: Diagnosis not present

## 2019-02-04 DIAGNOSIS — F1021 Alcohol dependence, in remission: Secondary | ICD-10-CM

## 2019-02-04 DIAGNOSIS — Z79899 Other long term (current) drug therapy: Secondary | ICD-10-CM

## 2019-02-04 DIAGNOSIS — J449 Chronic obstructive pulmonary disease, unspecified: Secondary | ICD-10-CM | POA: Diagnosis not present

## 2019-02-04 NOTE — Patient Instructions (Addendum)
YOU CAN CALL TO MAKE AN ULTRASOUND..  I have put in an order for an ultrasound for you to have You can set them up at your convenience by calling this number [phone] You will likely have the ultrasound at [address] [facility] [address]  If you have any issues call our office and we will set this up for you.     Ask insurance and pharmacy about shingrix - new vaccine   Can go to [email or web address] for more information  Shingrix Vaccination  Two vaccines are licensed and recommended to prevent shingles in the [place] Zoster vaccine live (ZVL, Zostavax) has been in use since 2006. Recombinant zoster vaccine (RZV, Shingrix), has been in use since 2017 and is recommended by ACIP as the preferred shingles vaccine.  What Everyone Should Know about Shingles Vaccine (Shingrix) One of the Recommended Vaccines by Disease Shingles vaccination is the only way to protect against shingles and postherpetic neuralgia (PHN), the most common complication from shingles. CDC recommends that healthy adults 50 years and older get two doses of the shingles vaccine called Shingrix (recombinant zoster vaccine), separated by 2 to 6 months, to prevent shingles and the complications from the disease. Your doctor or pharmacist can give you Shingrix as a shot in your upper arm. Shingrix provides strong protection against shingles and PHN. Two doses of Shingrix is more than 90% effective at preventing shingles and PHN. Protection stays above 85% for at least the first four years after you get vaccinated. Shingrix is the preferred vaccine, over Zostavax (zoster vaccine live), a shingles vaccine in use since 2006. Zostavax may still be used to prevent shingles in healthy adults 60 years and older. For example, you could use Zostavax if a person is allergic to Shingrix, prefers Zostavax, or requests immediate vaccination and Shingrix is unavailable. Who Should Get Shingrix?  Healthy adults 50 years and older should get two doses of Shingrix, separated by 2 to 6 months. You should get Shingrix even if in the past you . had shingles  . received Zostavax  . are not sure if you had chickenpox There is no maximum age for getting Shingrix. If you had shingles in the past, you can get Shingrix to help prevent future occurrences of the disease. There is no specific length of time that you need to wait after having shingles before you can receive Shingrix, but generally you should make sure the shingles rash has gone away before getting vaccinated. You can get Shingrix whether or not you remember having had chickenpox in the past. Studies show that more than 99% of Americans 40 years and older have had chickenpox, even if they don't remember having the disease. Chickenpox and shingles are related because they are caused by the same virus (varicella zoster virus). After a person recovers from chickenpox, the virus stays dormant (inactive) in the body. It can reactivate years later and cause shingles. If you had Zostavax in the recent past, you should wait at least eight weeks before getting Shingrix. Talk to your healthcare provider to determine the best time to get Shingrix. Shingrix is available in [employer] and pharmacies. To find doctor's offices or pharmacies near you that offer the vaccine, visit HealthMap Vaccine FinderExternal. If you have questions about Shingrix, talk with your healthcare provider. Vaccine for Those 71 Years and Older  Shingrix reduces the risk of shingles and PHN by more than 90% in people 14 and older. CDC recommends the vaccine for healthy adults  26 and older.  Who Should Not Get Shingrix? You should not get Shingrix if you: . have ever had a severe allergic reaction to any component of the vaccine or after a dose of Shingrix  . tested negative for immunity to varicella zoster virus. If you test negative, you should get chickenpox vaccine.  .  currently have shingles  . currently are pregnant or breastfeeding. Women who are pregnant or breastfeeding should wait to get Shingrix.  Marland Kitchen receive specific antiviral drugs (acyclovir, famciclovir, or valacyclovir) 24 hours before vaccination (avoid use of these antiviral drugs for 14 days after vaccination)- zoster vaccine live only If you have a minor acute (starts suddenly) illness, such as a cold, you may get Shingrix. But if you have a moderate or severe acute illness, you should usually wait until you recover before getting the vaccine. This includes anyone with a temperature of 101.58F or higher. The side effects of the Shingrix are temporary, and usually last 2 to 3 days. While you may experience pain for a few days after getting Shingrix, the pain will be less severe than having shingles and the complications from the disease. How Well Does Shingrix Work? Two doses of Shingrix provides strong protection against shingles and postherpetic neuralgia (PHN), the most common complication of shingles. . In adults 15 to 59 years old who got two doses, Shingrix was 97% effective in preventing shingles; among adults 70 years and older, Shingrix was 91% effective.  . In adults 11 to 59 years old who got two doses, Shingrix was 91% effective in preventing PHN; among adults 70 years and older, Shingrix was 89% effective. Shingrix protection remained high (more than 85%) in people 70 years and older throughout the four years following vaccination. Since your risk of shingles and PHN increases as you get older, it is important to have strong protection against shingles in your older years. Top of Page  What Are the Possible Side Effects of Shingrix? Studies show that Shingrix is safe. The vaccine helps your body create a strong defense against shingles. As a result, you are likely to have temporary side effects from getting the shots. The side effects may affect your ability to do normal daily activities for 2  to 3 days. Most people got a sore arm with mild or moderate pain after getting Shingrix, and some also had redness and swelling where they got the shot. Some people felt tired, had muscle pain, a headache, shivering, fever, stomach pain, or nausea. About 1 out of 6 people who got Shingrix experienced side effects that prevented them from doing regular activities. Symptoms went away on their own in about 2 to 3 days. Side effects were more common in younger people. You might have a reaction to the first or second dose of Shingrix, or both doses. If you experience side effects, you may choose to take over-the-counter pain medicine such as ibuprofen or acetaminophen. If you experience side effects from Shingrix, you should report them to the Vaccine Adverse Event Reporting System (VAERS). Your doctor might file this report, or you can do it yourself through the VAERS websiteExternal, or by calling [phone]. If you have any questions about side effects from Shingrix, talk with your doctor. The shingles vaccine does not contain thimerosal (a preservative containing mercury). Top of Page  When Should I See a Doctor Because of the Side Effects I Experience From Shingrix? In clinical trials, Shingrix was not associated with serious adverse events. In fact, serious side effects  from vaccines are extremely rare. For example, for every 1 million doses of a vaccine given, only one or two people may have a severe allergic reaction. Signs of an allergic reaction happen within minutes or hours after vaccination and include hives, swelling of the face and throat, difficulty breathing, a fast heartbeat, dizziness, or weakness. If you experience these or any other life-threatening symptoms, see a doctor right away. Shingrix causes a strong response in your immune system, so it may produce short-term side effects more intense than you are used to from other vaccines. These side effects can be uncomfortable, but they are  expected and usually go away on their own in 2 or 3 days. Top of Page  How Can I Pay For Shingrix? There are several ways shingles vaccine may be paid for: Medicare . Medicare Part D plans cover the shingles vaccine, but there may be a cost to you depending on your plan. There may be a copay for the vaccine, or you may need to pay in full then get reimbursed for a certain amount.  . Medicare Part B does not cover the shingles vaccine. Medicaid . Medicaid may or may not cover the vaccine. Contact your insurer to find out. Private health insurance . Many private health insurance plans will cover the vaccine, but there may be a cost to you depending on your plan. Contact your insurer to find out. Vaccine assistance programs . Some pharmaceutical companies provide vaccines to eligible adults who cannot afford them. You may want to check with the vaccine manufacturer, [employer], about Shingrix. If you do not currently have health insurance, learn more about affordable health coverage optionsExternal. To find doctor's offices or pharmacies near you that offer the vaccine, visit HealthMap Vaccine FinderExternal.

## 2019-02-05 LAB — CBC WITH DIFFERENTIAL/PLATELET
Absolute Monocytes: 600 cells/uL (ref 200–950)
Basophils Absolute: 68 cells/uL (ref 0–200)
Basophils Relative: 0.9 %
Eosinophils Absolute: 368 cells/uL (ref 15–500)
Eosinophils Relative: 4.9 %
HCT: 41.2 % (ref 38.5–50.0)
Hemoglobin: 14.1 g/dL (ref 13.2–17.1)
Lymphs Abs: 1868 cells/uL (ref 850–3900)
MCH: 29.9 pg (ref 27.0–33.0)
MCHC: 34.2 g/dL (ref 32.0–36.0)
MCV: 87.3 fL (ref 80.0–100.0)
MPV: 12 fL (ref 7.5–12.5)
Monocytes Relative: 8 %
Neutro Abs: 4598 cells/uL (ref 1500–7800)
Neutrophils Relative %: 61.3 %
Platelets: 157 10*3/uL (ref 140–400)
RBC: 4.72 10*6/uL (ref 4.20–5.80)
RDW: 13.1 % (ref 11.0–15.0)
Total Lymphocyte: 24.9 %
WBC: 7.5 10*3/uL (ref 3.8–10.8)

## 2019-02-05 LAB — COMPLETE METABOLIC PANEL WITH GFR
AG Ratio: 2.3 (calc) (ref 1.0–2.5)
ALT: 39 U/L (ref 9–46)
AST: 26 U/L (ref 10–35)
Albumin: 4.3 g/dL (ref 3.6–5.1)
Alkaline phosphatase (APISO): 53 U/L (ref 35–144)
BUN/Creatinine Ratio: 38 (calc) — ABNORMAL HIGH (ref 6–22)
BUN: 26 mg/dL — ABNORMAL HIGH (ref 7–25)
CO2: 25 mmol/L (ref 20–32)
Calcium: 9.2 mg/dL (ref 8.6–10.3)
Chloride: 106 mmol/L (ref 98–110)
Creat: 0.69 mg/dL — ABNORMAL LOW (ref 0.70–1.33)
GFR, Est African American: 120 mL/min/{1.73_m2} (ref >=60)
GFR, Est Non African American: 104 mL/min/{1.73_m2} (ref >=60)
Globulin: 1.9 g/dL (calc) (ref 1.9–3.7)
Glucose, Bld: 99 mg/dL (ref 65–99)
Potassium: 4.4 mmol/L (ref 3.5–5.3)
Sodium: 139 mmol/L (ref 135–146)
Total Bilirubin: 0.4 mg/dL (ref 0.2–1.2)
Total Protein: 6.2 g/dL (ref 6.1–8.1)

## 2019-02-05 LAB — HEMOGLOBIN A1C
Hgb A1c MFr Bld: 5.9 % of total Hgb — ABNORMAL HIGH (ref <5.7)
Mean Plasma Glucose: 123 (calc)
eAG (mmol/L): 6.8 (calc)

## 2019-02-05 LAB — LIPID PANEL
Cholesterol: 192 mg/dL (ref <200)
HDL: 42 mg/dL (ref >=40)
LDL Cholesterol (Calc): 122 mg/dL (calc) — ABNORMAL HIGH
Non-HDL Cholesterol (Calc): 150 mg/dL (calc) — ABNORMAL HIGH (ref <130)
Total CHOL/HDL Ratio: 4.6 (calc) (ref <5.0)
Triglycerides: 163 mg/dL — ABNORMAL HIGH (ref <150)

## 2019-02-05 LAB — MAGNESIUM: Magnesium: 1.9 mg/dL (ref 1.5–2.5)

## 2019-02-05 LAB — TSH: TSH: 1.59 mIU/L (ref 0.40–4.50)

## 2019-02-11 ENCOUNTER — Ambulatory Visit
Admission: RE | Admit: 2019-02-11 | Discharge: 2019-02-11 | Disposition: A | Discharge status: Home / Self Care | Payer: Medicare Other | Source: Ambulatory Visit | Attending: Physician Assistant | Admitting: Physician Assistant

## 2019-02-11 DIAGNOSIS — N2889 Other specified disorders of kidney and ureter: Secondary | ICD-10-CM

## 2019-02-11 DIAGNOSIS — N281 Cyst of kidney, acquired: Secondary | ICD-10-CM | POA: Diagnosis not present

## 2019-03-13 ENCOUNTER — Other Ambulatory Visit: Payer: Self-pay | Admitting: Adult Health

## 2019-04-16 ENCOUNTER — Encounter: Payer: Self-pay | Admitting: Physician Assistant
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
## 2019-05-14 ENCOUNTER — Other Ambulatory Visit: Payer: Self-pay

## 2019-05-14 ENCOUNTER — Encounter: Payer: Self-pay | Admitting: Physician Assistant

## 2019-05-14 ENCOUNTER — Ambulatory Visit (INDEPENDENT_AMBULATORY_CARE_PROVIDER_SITE_OTHER): Payer: Medicare Other | Admitting: Physician Assistant

## 2019-05-14 VITALS — BP 118/72 | HR 56 | Temp 97.3°F | Ht 73.0 in | Wt 235.0 lb

## 2019-05-14 DIAGNOSIS — E559 Vitamin D deficiency, unspecified: Secondary | ICD-10-CM

## 2019-05-14 DIAGNOSIS — E782 Mixed hyperlipidemia: Secondary | ICD-10-CM | POA: Diagnosis not present

## 2019-05-14 DIAGNOSIS — H6191 Disorder of right external ear, unspecified: Secondary | ICD-10-CM

## 2019-05-14 DIAGNOSIS — Z0001 Encounter for general adult medical examination with abnormal findings: Secondary | ICD-10-CM

## 2019-05-14 DIAGNOSIS — J449 Chronic obstructive pulmonary disease, unspecified: Secondary | ICD-10-CM

## 2019-05-14 DIAGNOSIS — R7309 Other abnormal glucose: Secondary | ICD-10-CM | POA: Diagnosis not present

## 2019-05-14 DIAGNOSIS — Z136 Encounter for screening for cardiovascular disorders: Secondary | ICD-10-CM

## 2019-05-14 DIAGNOSIS — Z87820 Personal history of traumatic brain injury: Secondary | ICD-10-CM

## 2019-05-14 DIAGNOSIS — G546 Phantom limb syndrome with pain: Secondary | ICD-10-CM

## 2019-05-14 DIAGNOSIS — I700 Atherosclerosis of aorta: Secondary | ICD-10-CM

## 2019-05-14 DIAGNOSIS — Z0000 Encounter for general adult medical examination without abnormal findings: Secondary | ICD-10-CM

## 2019-05-14 DIAGNOSIS — Z89512 Acquired absence of left leg below knee: Secondary | ICD-10-CM

## 2019-05-14 DIAGNOSIS — I158 Other secondary hypertension: Secondary | ICD-10-CM | POA: Diagnosis not present

## 2019-05-14 DIAGNOSIS — N2889 Other specified disorders of kidney and ureter: Secondary | ICD-10-CM

## 2019-05-14 DIAGNOSIS — Z79899 Other long term (current) drug therapy: Secondary | ICD-10-CM

## 2019-05-14 DIAGNOSIS — Z23 Encounter for immunization: Secondary | ICD-10-CM

## 2019-05-14 DIAGNOSIS — G8929 Other chronic pain: Secondary | ICD-10-CM

## 2019-05-14 DIAGNOSIS — T8484XA Pain due to internal orthopedic prosthetic devices, implants and grafts, initial encounter: Secondary | ICD-10-CM

## 2019-05-14 DIAGNOSIS — F1021 Alcohol dependence, in remission: Secondary | ICD-10-CM

## 2019-05-14 DIAGNOSIS — Z125 Encounter for screening for malignant neoplasm of prostate: Secondary | ICD-10-CM

## 2019-05-14 NOTE — Patient Instructions (Signed)
Use a dropper or use a cap to put peroxide, olive oil,mineral oil or canola oil in the effected ear- 2-3 times a week. Let it soak for 20-30 min then you can take a shower or use a baby bulb with warm water to wash out the ear wax.  °Can buy debrox wax removal kit over the counter.  °Do not use Qtips ° ° °General eating tips ° °What to Avoid °• Avoid added sugars °o Often added sugar can be found in processed foods such as many condiments, dry cereals, cakes, cookies, chips, crisps, crackers, candies, sweetened drinks, etc.  °o Read labels and AVOID/DECREASE use of foods with the following in their ingredient list: Sugar, fructose, high fructose corn syrup, sucrose, glucose, maltose, dextrose, molasses, cane sugar, brown sugar, any type of syrup, agave nectar, etc.   °• Avoid snacking in between meals- drink water or if you feel you need a snack, pick a high water content snack such as cucumbers, watermelon, or any veggie.  °• Avoid foods made with flour °o If you are going to eat food made with flour, choose those made with whole-grains; and, minimize your consumption as much as is tolerable °• Avoid processed foods °o These foods are generally stocked in the middle of the grocery store.  °o Focus on shopping on the perimeter of the grocery.  °What to Include °• Vegetables °o GREEN LEAFY VEGETABLES: Kale, spinach, mustard greens, collard greens, cabbage, broccoli, etc. °o OTHER: Asparagus, cauliflower, eggplant, carrots, peas, Brussel sprouts, tomatoes, bell peppers, zucchini, beets, cucumbers, etc. °• Grains, seeds, and legumes °o Beans: kidney beans, black eyed peas, garbanzo beans, black beans, pinto beans, etc. °o Whole, unrefined grains: brown rice, barley, bulgur, oatmeal, etc. °• Healthy fats  °o Avoid highly processed fats such as vegetable oil °o Examples of healthy fats: avocado, olives, virgin olive oil, dark chocolate (?72% Cocoa), nuts (peanuts, almonds, walnuts, cashews, pecans, etc.) °o Please still  do small amount of these healthy fats, they are dense in calories.  °• Low - Moderate Intake of Animal Sources of Protein °o Meat sources: chicken, turkey, salmon, tuna. Limit to 4 ounces of meat at one time or the size of your palm. °o Consider limiting dairy sources, but when choosing dairy focus on: PLAIN Greek yogurt, cottage cheese, high-protein milk °• Fruit °o Choose berries  ° °

## 2019-05-15 LAB — COMPLETE METABOLIC PANEL WITH GFR
AG Ratio: 2.2 (calc) (ref 1.0–2.5)
ALT: 53 U/L — ABNORMAL HIGH (ref 9–46)
AST: 32 U/L (ref 10–35)
Albumin: 4.6 g/dL (ref 3.6–5.1)
Alkaline phosphatase (APISO): 55 U/L (ref 35–144)
BUN: 18 mg/dL (ref 7–25)
CO2: 25 mmol/L (ref 20–32)
Calcium: 9.7 mg/dL (ref 8.6–10.3)
Chloride: 106 mmol/L (ref 98–110)
Creat: 0.76 mg/dL (ref 0.70–1.33)
GFR, Est African American: 116 mL/min/{1.73_m2} (ref >=60)
GFR, Est Non African American: 100 mL/min/{1.73_m2} (ref >=60)
Globulin: 2.1 g/dL (calc) (ref 1.9–3.7)
Glucose, Bld: 99 mg/dL (ref 65–99)
Potassium: 4.6 mmol/L (ref 3.5–5.3)
Sodium: 141 mmol/L (ref 135–146)
Total Bilirubin: 0.5 mg/dL (ref 0.2–1.2)
Total Protein: 6.7 g/dL (ref 6.1–8.1)

## 2019-05-15 LAB — HEMOGLOBIN A1C
Hgb A1c MFr Bld: 6 % of total Hgb — ABNORMAL HIGH (ref <5.7)
Mean Plasma Glucose: 126 (calc)
eAG (mmol/L): 7 (calc)

## 2019-05-15 LAB — CBC WITH DIFFERENTIAL/PLATELET
Absolute Monocytes: 612 cells/uL (ref 200–950)
Basophils Absolute: 51 cells/uL (ref 0–200)
Basophils Relative: 0.6 %
Eosinophils Absolute: 281 cells/uL (ref 15–500)
Eosinophils Relative: 3.3 %
HCT: 46.1 % (ref 38.5–50.0)
Hemoglobin: 15.2 g/dL (ref 13.2–17.1)
Lymphs Abs: 1955 cells/uL (ref 850–3900)
MCH: 28.8 pg (ref 27.0–33.0)
MCHC: 33 g/dL (ref 32.0–36.0)
MCV: 87.3 fL (ref 80.0–100.0)
MPV: 11.9 fL (ref 7.5–12.5)
Monocytes Relative: 7.2 %
Neutro Abs: 5602 cells/uL (ref 1500–7800)
Neutrophils Relative %: 65.9 %
Platelets: 189 10*3/uL (ref 140–400)
RBC: 5.28 10*6/uL (ref 4.20–5.80)
RDW: 12.9 % (ref 11.0–15.0)
Total Lymphocyte: 23 %
WBC: 8.5 10*3/uL (ref 3.8–10.8)

## 2019-05-15 LAB — URINALYSIS, ROUTINE W REFLEX MICROSCOPIC
Bacteria, UA: NONE SEEN /HPF
Bilirubin Urine: NEGATIVE
Glucose, UA: NEGATIVE
Hgb urine dipstick: NEGATIVE
Hyaline Cast: NONE SEEN /LPF
Leukocytes,Ua: NEGATIVE
Nitrite: NEGATIVE
Specific Gravity, Urine: 1.036 — ABNORMAL HIGH (ref 1.001–1.03)
Squamous Epithelial / HPF: NONE SEEN /HPF (ref <=5)
WBC, UA: NONE SEEN /HPF (ref 0–5)
pH: 5.5 (ref 5.0–8.0)

## 2019-05-15 LAB — LIPID PANEL
Cholesterol: 215 mg/dL — ABNORMAL HIGH (ref <200)
HDL: 46 mg/dL (ref >=40)
LDL Cholesterol (Calc): 136 mg/dL (calc) — ABNORMAL HIGH
Non-HDL Cholesterol (Calc): 169 mg/dL (calc) — ABNORMAL HIGH (ref <130)
Total CHOL/HDL Ratio: 4.7 (calc) (ref <5.0)
Triglycerides: 192 mg/dL — ABNORMAL HIGH (ref <150)

## 2019-05-15 LAB — PSA: PSA: 3.1 ng/mL (ref <=4.0)

## 2019-05-15 LAB — MICROALBUMIN / CREATININE URINE RATIO
Creatinine, Urine: 282 mg/dL (ref 20–320)
Microalb Creat Ratio: 14 mcg/mg creat (ref <30)
Microalb, Ur: 3.9 mg/dL

## 2019-05-15 LAB — TSH: TSH: 1.42 mIU/L (ref 0.40–4.50)

## 2019-05-15 LAB — MAGNESIUM: Magnesium: 2 mg/dL (ref 1.5–2.5)

## 2019-05-15 LAB — VITAMIN D 25 HYDROXY (VIT D DEFICIENCY, FRACTURES): Vit D, 25-Hydroxy: 33 ng/mL (ref 30–100)

## 2019-05-31 MED ORDER — CITALOPRAM HYDROBROMIDE 20 MG PO TABS: ORAL_TABLET | ORAL | 1 refills | Status: DC

## 2019-06-07 ENCOUNTER — Other Ambulatory Visit: Payer: Self-pay

## 2019-06-07 DIAGNOSIS — Z20822 Contact with and (suspected) exposure to covid-19: Secondary | ICD-10-CM

## 2019-06-08 LAB — NOVEL CORONAVIRUS, NAA: SARS-CoV-2, NAA: NOT DETECTED

## 2019-07-11 MED ORDER — ESOMEPRAZOLE MAGNESIUM 40 MG PO CPDR: DELAYED_RELEASE_CAPSULE | ORAL | 3 refills | Status: DC

## 2019-09-14 ENCOUNTER — Encounter: Payer: Self-pay | Admitting: Internal Medicine

## 2019-09-14 ENCOUNTER — Other Ambulatory Visit: Payer: Self-pay | Admitting: Physician Assistant

## 2019-09-14 ENCOUNTER — Other Ambulatory Visit: Payer: Self-pay

## 2019-09-14 ENCOUNTER — Ambulatory Visit: Payer: Medicare Other | Admitting: Internal Medicine

## 2019-09-14 VITALS — BP 140/90 | HR 60 | Temp 96.5°F | Resp 16 | Ht 73.0 in | Wt 234.4 lb

## 2019-09-14 DIAGNOSIS — E559 Vitamin D deficiency, unspecified: Secondary | ICD-10-CM

## 2019-09-14 DIAGNOSIS — Z79899 Other long term (current) drug therapy: Secondary | ICD-10-CM

## 2019-09-14 DIAGNOSIS — I10 Essential (primary) hypertension: Secondary | ICD-10-CM

## 2019-09-14 DIAGNOSIS — G8929 Other chronic pain: Secondary | ICD-10-CM

## 2019-09-14 DIAGNOSIS — G243 Spasmodic torticollis: Secondary | ICD-10-CM

## 2019-09-14 DIAGNOSIS — E782 Mixed hyperlipidemia: Secondary | ICD-10-CM | POA: Diagnosis not present

## 2019-09-14 DIAGNOSIS — R7309 Other abnormal glucose: Secondary | ICD-10-CM
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
## 2019-09-14 NOTE — Patient Instructions (Signed)

## 2019-09-15 LAB — CBC WITH DIFFERENTIAL/PLATELET
Absolute Monocytes: 521 cells/uL (ref 200–950)
Basophils Absolute: 47 cells/uL (ref 0–200)
Basophils Relative: 0.6 %
Eosinophils Absolute: 292 cells/uL (ref 15–500)
Eosinophils Relative: 3.7 %
HCT: 45.7 % (ref 38.5–50.0)
Hemoglobin: 15.3 g/dL (ref 13.2–17.1)
Lymphs Abs: 1991 cells/uL (ref 850–3900)
MCH: 29.5 pg (ref 27.0–33.0)
MCHC: 33.5 g/dL (ref 32.0–36.0)
MCV: 88.2 fL (ref 80.0–100.0)
MPV: 12.6 fL — ABNORMAL HIGH (ref 7.5–12.5)
Monocytes Relative: 6.6 %
Neutro Abs: 5048 cells/uL (ref 1500–7800)
Neutrophils Relative %: 63.9 %
Platelets: 198 10*3/uL (ref 140–400)
RBC: 5.18 10*6/uL (ref 4.20–5.80)
RDW: 13.1 % (ref 11.0–15.0)
Total Lymphocyte: 25.2 %
WBC: 7.9 10*3/uL (ref 3.8–10.8)

## 2019-09-15 LAB — LIPID PANEL
Cholesterol: 186 mg/dL (ref <200)
HDL: 47 mg/dL (ref >=40)
Non-HDL Cholesterol (Calc): 139 mg/dL (calc) — ABNORMAL HIGH (ref <130)
Total CHOL/HDL Ratio: 4 (calc) (ref <5.0)
Triglycerides: 460 mg/dL — ABNORMAL HIGH (ref <150)

## 2019-09-15 LAB — COMPLETE METABOLIC PANEL WITH GFR
AG Ratio: 2 (calc) (ref 1.0–2.5)
ALT: 54 U/L — ABNORMAL HIGH (ref 9–46)
AST: 31 U/L (ref 10–35)
Albumin: 4.3 g/dL (ref 3.6–5.1)
Alkaline phosphatase (APISO): 64 U/L (ref 35–144)
BUN/Creatinine Ratio: 24 (calc) — ABNORMAL HIGH (ref 6–22)
BUN: 16 mg/dL (ref 7–25)
CO2: 26 mmol/L (ref 20–32)
Calcium: 9.3 mg/dL (ref 8.6–10.3)
Chloride: 108 mmol/L (ref 98–110)
Creat: 0.66 mg/dL — ABNORMAL LOW (ref 0.70–1.33)
GFR, Est African American: 123 mL/min/{1.73_m2} (ref >=60)
GFR, Est Non African American: 106 mL/min/{1.73_m2} (ref >=60)
Globulin: 2.1 g/dL (calc) (ref 1.9–3.7)
Glucose, Bld: 122 mg/dL — ABNORMAL HIGH (ref 65–99)
Potassium: 4 mmol/L (ref 3.5–5.3)
Sodium: 140 mmol/L (ref 135–146)
Total Bilirubin: 0.2 mg/dL (ref 0.2–1.2)
Total Protein: 6.4 g/dL (ref 6.1–8.1)

## 2019-09-15 LAB — HEMOGLOBIN A1C
Hgb A1c MFr Bld: 6.1 % of total Hgb — ABNORMAL HIGH (ref <5.7)
Mean Plasma Glucose: 128 (calc)
eAG (mmol/L): 7.1 (calc)

## 2019-09-15 LAB — TSH: TSH: 2.58 mIU/L (ref 0.40–4.50)

## 2019-09-15 LAB — MAGNESIUM: Magnesium: 1.9 mg/dL (ref 1.5–2.5)

## 2019-09-15 LAB — VITAMIN D 25 HYDROXY (VIT D DEFICIENCY, FRACTURES): Vit D, 25-Hydroxy: 23 ng/mL — ABNORMAL LOW (ref 30–100)

## 2019-09-15 LAB — INSULIN, RANDOM: Insulin: 56.2 u[IU]/mL — ABNORMAL HIGH

## 2019-09-18 ENCOUNTER — Encounter: Payer: Self-pay | Admitting: Internal Medicine

## 2019-09-21 NOTE — Telephone Encounter (Signed)
Per Dr Unk Pinto faxed prescription for new prosthetic liners for BKA prosthesis to  Brooke Pace, [facility],    Dx: [facility] [phone] Fax [phone] [email or web address]

## 2019-09-28 DIAGNOSIS — Z89512 Acquired absence of left leg below knee: Secondary | ICD-10-CM | POA: Diagnosis not present

## 2019-10-25 ENCOUNTER — Other Ambulatory Visit: Payer: Self-pay | Admitting: Physician Assistant

## 2019-10-25 DIAGNOSIS — I10 Essential (primary) hypertension: Secondary | ICD-10-CM

## 2019-10-25 DIAGNOSIS — G546 Phantom limb syndrome with pain: Secondary | ICD-10-CM

## 2019-10-25 DIAGNOSIS — Z89512 Acquired absence of left leg below knee: Secondary | ICD-10-CM

## 2019-10-25 DIAGNOSIS — S069X3S Unspecified intracranial injury with loss of consciousness of 1 hour to 5 hours 59 minutes, sequela: Secondary | ICD-10-CM

## 2019-10-25 DIAGNOSIS — S143XXS Injury of brachial plexus, sequela: Secondary | ICD-10-CM

## 2019-11-27 ENCOUNTER — Other Ambulatory Visit: Payer: Self-pay | Admitting: Internal Medicine

## 2019-12-13 ENCOUNTER — Telehealth: Payer: Self-pay | Admitting: *Deleted

## 2019-12-13 NOTE — Telephone Encounter (Signed)
Spouse advised disability papers are available for pick up.

## 2019-12-18 ENCOUNTER — Other Ambulatory Visit: Payer: Self-pay | Admitting: Physician Assistant

## 2020-01-01 ENCOUNTER — Other Ambulatory Visit: Payer: Self-pay | Admitting: Internal Medicine

## 2020-01-08 ENCOUNTER — Other Ambulatory Visit: Payer: Self-pay | Admitting: Internal Medicine

## 2020-01-08 DIAGNOSIS — Z89512 Acquired absence of left leg below knee: Secondary | ICD-10-CM

## 2020-01-08 DIAGNOSIS — S143XXS Injury of brachial plexus, sequela: Secondary | ICD-10-CM

## 2020-01-08 DIAGNOSIS — I10 Essential (primary) hypertension: Secondary | ICD-10-CM

## 2020-01-08 DIAGNOSIS — G546 Phantom limb syndrome with pain: Secondary | ICD-10-CM

## 2020-01-08 DIAGNOSIS — S069X3S Unspecified intracranial injury with loss of consciousness of 1 hour to 5 hours 59 minutes, sequela: Secondary | ICD-10-CM

## 2020-02-04 ENCOUNTER — Other Ambulatory Visit: Payer: Self-pay

## 2020-02-04 ENCOUNTER — Encounter: Payer: Self-pay | Admitting: Internal Medicine

## 2020-02-04 ENCOUNTER — Ambulatory Visit: Payer: Medicare Other | Admitting: Internal Medicine

## 2020-02-04 VITALS — BP 116/82 | HR 64 | Temp 97.6°F | Resp 18 | Ht 73.0 in | Wt 224.0 lb

## 2020-02-04 DIAGNOSIS — E782 Mixed hyperlipidemia: Secondary | ICD-10-CM | POA: Diagnosis not present

## 2020-02-04 DIAGNOSIS — M25511 Pain in right shoulder: Secondary | ICD-10-CM | POA: Diagnosis not present

## 2020-02-04 DIAGNOSIS — Z136 Encounter for screening for cardiovascular disorders: Secondary | ICD-10-CM

## 2020-02-04 DIAGNOSIS — I10 Essential (primary) hypertension: Secondary | ICD-10-CM

## 2020-02-04 DIAGNOSIS — R7309 Other abnormal glucose: Secondary | ICD-10-CM

## 2020-02-04 DIAGNOSIS — G8929 Other chronic pain: Secondary | ICD-10-CM

## 2020-02-04 MED ORDER — MELOXICAM 15 MG PO TABS: ORAL_TABLET | ORAL | 0 refills | Status: DC

## 2020-02-04 MED ORDER — DEXAMETHASONE 4 MG PO TABS: ORAL_TABLET | ORAL | 0 refills | Status: DC

## 2020-02-04 NOTE — Patient Instructions (Signed)
Shoulder Pain Many things can cause shoulder pain, including: An injury to the shoulder. Overuse of the shoulder. Arthritis. The source of the pain can be: Inflammation. An injury to the shoulder joint. An injury to a tendon, ligament, or bone. Follow these instructions at home: Pay attention to changes in your symptoms. Let your health care provider know about them. Follow these instructions to relieve your pain. If you have a sling: Wear the sling as told by your health care provider. Remove it only as told by your health care provider. Loosen the sling if your fingers tingle, become numb, or turn cold and blue. Keep the sling clean. If the sling is not waterproof: Do not let it get wet. Remove it to shower or bathe. Move your arm as little as possible, but keep your hand moving to prevent swelling. Managing pain, stiffness, and swelling If directed, put ice on the painful area: Put ice in a plastic bag. Place a towel between your skin and the bag. Leave the ice on for 20 minutes, 2-3 times per day. Stop applying ice if it does not help with the pain. Squeeze a soft ball or a foam pad as much as possible. This helps to keep the shoulder from swelling. It also helps to strengthen the arm. General instructions Take over-the-counter and prescription medicines only as told by your health care provider. Keep all follow-up visits as told by your health care provider. This is important. Contact a health care provider if: Your pain gets worse. Your pain is not relieved with medicines. New pain develops in your arm, hand, or fingers. Get help right away if: Your arm, hand, or fingers: Tingle. Become numb. Become swollen. Become painful. Turn white or blue. Summary Shoulder pain can be caused by an injury, overuse, or arthritis. Pay attention to changes in your symptoms. Let your health care provider know about them. This condition may be treated with a sling, ice, and pain  medicines. Contact your health care provider if the pain gets worse or new pain develops. Get help right away if your arm, hand, or fingers tingle or become numb, swollen, or painful. Keep all follow-up visits as told by your health care provider. This is important.   
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
## 2020-02-22 ENCOUNTER — Other Ambulatory Visit: Payer: Self-pay

## 2020-02-22 ENCOUNTER — Encounter: Payer: Self-pay | Admitting: Physician Assistant

## 2020-02-22 ENCOUNTER — Ambulatory Visit: Payer: Medicare Other | Admitting: Physician Assistant

## 2020-02-22 VITALS — BP 136/82 | HR 63 | Temp 97.5°F | Ht 73.0 in | Wt 215.0 lb

## 2020-02-22 DIAGNOSIS — Z89512 Acquired absence of left leg below knee: Secondary | ICD-10-CM

## 2020-02-22 DIAGNOSIS — Z0000 Encounter for general adult medical examination without abnormal findings: Secondary | ICD-10-CM

## 2020-02-22 DIAGNOSIS — E559 Vitamin D deficiency, unspecified: Secondary | ICD-10-CM

## 2020-02-22 DIAGNOSIS — Z1211 Encounter for screening for malignant neoplasm of colon: Secondary | ICD-10-CM

## 2020-02-22 DIAGNOSIS — R7309 Other abnormal glucose: Secondary | ICD-10-CM | POA: Diagnosis not present

## 2020-02-22 DIAGNOSIS — R6889 Other general symptoms and signs: Secondary | ICD-10-CM

## 2020-02-22 DIAGNOSIS — G546 Phantom limb syndrome with pain: Secondary | ICD-10-CM | POA: Diagnosis not present

## 2020-02-22 DIAGNOSIS — G8929 Other chronic pain: Secondary | ICD-10-CM

## 2020-02-22 DIAGNOSIS — I700 Atherosclerosis of aorta: Secondary | ICD-10-CM | POA: Diagnosis not present

## 2020-02-22 DIAGNOSIS — I10 Essential (primary) hypertension: Secondary | ICD-10-CM

## 2020-02-22 DIAGNOSIS — J449 Chronic obstructive pulmonary disease, unspecified: Secondary | ICD-10-CM

## 2020-02-22 DIAGNOSIS — Z79899 Other long term (current) drug therapy: Secondary | ICD-10-CM

## 2020-02-22 DIAGNOSIS — Z0001 Encounter for general adult medical examination with abnormal findings: Secondary | ICD-10-CM | POA: Diagnosis not present

## 2020-02-22 DIAGNOSIS — F1021 Alcohol dependence, in remission: Secondary | ICD-10-CM

## 2020-02-22 DIAGNOSIS — S069X3S Unspecified intracranial injury with loss of consciousness of 1 hour to 5 hours 59 minutes, sequela: Secondary | ICD-10-CM

## 2020-02-22 DIAGNOSIS — T8484XA Pain due to internal orthopedic prosthetic devices, implants and grafts, initial encounter: Secondary | ICD-10-CM

## 2020-02-22 DIAGNOSIS — S143XXS Injury of brachial plexus, sequela: Secondary | ICD-10-CM

## 2020-02-22 DIAGNOSIS — E782 Mixed hyperlipidemia: Secondary | ICD-10-CM

## 2020-02-22 DIAGNOSIS — K219 Gastro-esophageal reflux disease without esophagitis: Secondary | ICD-10-CM

## 2020-02-22 MED ORDER — METOPROLOL TARTRATE 50 MG PO TABS: ORAL_TABLET | ORAL | 2 refills | Status: DC

## 2020-02-22 NOTE — Patient Instructions (Addendum)
Going to refer for colonoscopy  Colon cancer is 3rd most diagnosed cancer and 2nd leading cause of death in both men and women 60 years of age and older despite being one of the most preventable and treatable cancers if found early.  4 of out 5 people diagnosed with colon cancer have NO prior family history.  When caught EARLY 90% of colon cancer is curable.    General eating tips  What to Avoid . Avoid added sugars o Often added sugar can be found in processed foods such as many condiments, dry cereals, cakes, cookies, chips, crisps, crackers, candies, sweetened drinks, etc.  Read labels and AVOID/DECREASE use of foods with the following in their ingredient list: Sugar, fructose, high fructose corn syrup, sucrose, glucose, maltose, dextrose, molasses, cane sugar, brown sugar, any type of syrup, agave nectar, etc.   . Avoid snacking in between meals- drink water or if you feel you need a snack, pick a high water content snack such as cucumbers, watermelon, or any veggie.  Marland Kitchen Avoid foods made with flour o If you are going to eat food made with flour, choose those made with whole-grains; and, minimize your consumption as much as is tolerable . Avoid processed foods o These foods are generally stocked in the middle of the grocery store.  o Focus on shopping on the perimeter of the grocery.  What to Include . Vegetables o GREEN LEAFY VEGETABLES: Kale, spinach, mustard greens, collard greens, cabbage, broccoli, etc. o OTHER: Asparagus, cauliflower, eggplant, carrots, peas, Brussel sprouts, tomatoes, bell peppers, zucchini, beets, cucumbers, etc. . Grains, seeds, and legumes o Beans: kidney beans, black eyed peas, garbanzo beans, black beans, pinto beans, etc. o Whole, unrefined grains: brown rice, barley, bulgur, oatmeal, etc. . Healthy fats  o Avoid highly processed fats such as vegetable oil o Examples of healthy fats: avocado, olives, virgin olive oil, dark chocolate (?72% Cocoa), nuts  (peanuts, almonds, walnuts, cashews, pecans, etc.) o Please still do small amount of these healthy fats, they are dense in calories.  . Low - Moderate Intake of Animal Sources of Protein o Meat sources: chicken, [place], salmon, tuna. Limit to 4 ounces of meat at one time or the size of your palm. o Consider limiting dairy sources, but when choosing dairy focus on: PLAIN [place] yogurt, cottage cheese, high-protein milk . Fruit o Choose berries

## 2020-02-23 LAB — COMPLETE METABOLIC PANEL WITH GFR
AG Ratio: 1.7 (calc) (ref 1.0–2.5)
ALT: 44 U/L (ref 9–46)
AST: 27 U/L (ref 10–35)
Albumin: 4.2 g/dL (ref 3.6–5.1)
Alkaline phosphatase (APISO): 51 U/L (ref 35–144)
BUN/Creatinine Ratio: 26 (calc) — ABNORMAL HIGH (ref 6–22)
BUN: 17 mg/dL (ref 7–25)
CO2: 25 mmol/L (ref 20–32)
Calcium: 9.7 mg/dL (ref 8.6–10.3)
Chloride: 100 mmol/L (ref 98–110)
Creat: 0.66 mg/dL — ABNORMAL LOW (ref 0.70–1.25)
GFR, Est African American: 122 mL/min/{1.73_m2} (ref >=60)
GFR, Est Non African American: 105 mL/min/{1.73_m2} (ref >=60)
Globulin: 2.5 g/dL (calc) (ref 1.9–3.7)
Glucose, Bld: 120 mg/dL — ABNORMAL HIGH (ref 65–99)
Potassium: 4.5 mmol/L (ref 3.5–5.3)
Sodium: 136 mmol/L (ref 135–146)
Total Bilirubin: 0.4 mg/dL (ref 0.2–1.2)
Total Protein: 6.7 g/dL (ref 6.1–8.1)

## 2020-02-23 LAB — LIPID PANEL
Cholesterol: 223 mg/dL — ABNORMAL HIGH (ref <200)
HDL: 52 mg/dL (ref >=40)
LDL Cholesterol (Calc): 146 mg/dL (calc) — ABNORMAL HIGH
Non-HDL Cholesterol (Calc): 171 mg/dL (calc) — ABNORMAL HIGH (ref <130)
Total CHOL/HDL Ratio: 4.3 (calc) (ref <5.0)
Triglycerides: 130 mg/dL (ref <150)

## 2020-02-23 LAB — MAGNESIUM: Magnesium: 1.9 mg/dL (ref 1.5–2.5)

## 2020-02-23 LAB — TSH: TSH: 1.24 mIU/L (ref 0.40–4.50)

## 2020-02-23 LAB — CBC WITH DIFFERENTIAL/PLATELET
Absolute Monocytes: 833 cells/uL (ref 200–950)
Basophils Absolute: 60 cells/uL (ref 0–200)
Basophils Relative: 0.5 %
Eosinophils Absolute: 452 cells/uL (ref 15–500)
Eosinophils Relative: 3.8 %
HCT: 46.2 % (ref 38.5–50.0)
Hemoglobin: 15.8 g/dL (ref 13.2–17.1)
Lymphs Abs: 1999 cells/uL (ref 850–3900)
MCH: 29.6 pg (ref 27.0–33.0)
MCHC: 34.2 g/dL (ref 32.0–36.0)
MCV: 86.5 fL (ref 80.0–100.0)
MPV: 11.5 fL (ref 7.5–12.5)
Monocytes Relative: 7 %
Neutro Abs: 8556 cells/uL — ABNORMAL HIGH (ref 1500–7800)
Neutrophils Relative %: 71.9 %
Platelets: 226 10*3/uL (ref 140–400)
RBC: 5.34 10*6/uL (ref 4.20–5.80)
RDW: 13.1 % (ref 11.0–15.0)
Total Lymphocyte: 16.8 %
WBC: 11.9 10*3/uL — ABNORMAL HIGH (ref 3.8–10.8)

## 2020-02-23 LAB — HEMOGLOBIN A1C
Hgb A1c MFr Bld: 6.3 % of total Hgb — ABNORMAL HIGH (ref <5.7)
Mean Plasma Glucose: 134 (calc)
eAG (mmol/L): 7.4 (calc)

## 2020-02-23 LAB — VITAMIN D 25 HYDROXY (VIT D DEFICIENCY, FRACTURES): Vit D, 25-Hydroxy: 51 ng/mL (ref 30–100)

## 2020-02-23 NOTE — Progress Notes (Signed)
COVID VACCINES HAVE BEEN ENTERED

## 2020-03-04 ENCOUNTER — Other Ambulatory Visit: Payer: Self-pay | Admitting: Internal Medicine

## 2020-03-04 ENCOUNTER — Other Ambulatory Visit: Payer: Self-pay | Admitting: Physician Assistant

## 2020-03-07 ENCOUNTER — Other Ambulatory Visit: Payer: Self-pay | Admitting: Physician Assistant

## 2020-03-07 ENCOUNTER — Other Ambulatory Visit: Payer: Self-pay | Admitting: Internal Medicine

## 2020-03-07 MED ORDER — FLUTICASONE-SALMETEROL 250-50 MCG/DOSE IN AEPB: 1.0000 | INHALATION_SPRAY | Freq: Two times a day (BID) | RESPIRATORY_TRACT | 2 refills | Status: DC

## 2020-03-07 MED ORDER — FLUTICASONE-SALMETEROL 500-50 MCG/DOSE IN AEPB: INHALATION_SPRAY | RESPIRATORY_TRACT | 1 refills | Status: DC

## 2020-03-07 NOTE — Progress Notes (Signed)
ad

## 2020-03-14 MED ORDER — FLUTICASONE-SALMETEROL 250-50 MCG/DOSE IN AEPB: 1.0000 | INHALATION_SPRAY | Freq: Two times a day (BID) | RESPIRATORY_TRACT | 2 refills | Status: DC

## 2020-03-31 ENCOUNTER — Other Ambulatory Visit: Payer: Self-pay | Admitting: Internal Medicine

## 2020-04-27 ENCOUNTER — Other Ambulatory Visit: Payer: Self-pay | Admitting: Internal Medicine

## 2020-04-27 DIAGNOSIS — G546 Phantom limb syndrome with pain: Secondary | ICD-10-CM

## 2020-04-27 DIAGNOSIS — I10 Essential (primary) hypertension: Secondary | ICD-10-CM

## 2020-04-27 DIAGNOSIS — Z89512 Acquired absence of left leg below knee: Secondary | ICD-10-CM

## 2020-04-27 DIAGNOSIS — S143XXS Injury of brachial plexus, sequela: Secondary | ICD-10-CM

## 2020-04-27 DIAGNOSIS — S069X3S Unspecified intracranial injury with loss of consciousness of 1 hour to 5 hours 59 minutes, sequela: Secondary | ICD-10-CM

## 2020-05-10 ENCOUNTER — Other Ambulatory Visit (HOSPITAL_COMMUNITY): Payer: Self-pay | Admitting: Internal Medicine

## 2020-05-10 ENCOUNTER — Ambulatory Visit: Payer: Medicare Other | Attending: Internal Medicine

## 2020-05-10 DIAGNOSIS — Z23 Encounter for immunization: Secondary | ICD-10-CM

## 2020-05-10 NOTE — Progress Notes (Signed)
   Covid-19 Vaccination Clinic  Name:  OCTAVE MONTROSE    MRN: [id number] DOB: 1961-05-15  05/10/2020  Mr. Pasko was observed post Covid-19 immunization for 15 minutes without incident. He was provided with Vaccine Information Sheet and instruction to access the V-Safe system.   Mr. Bushong was instructed to call 911 with any severe reactions post vaccine: Marland Kitchen Difficulty breathing  . Swelling of face and throat  . A fast heartbeat  . A bad rash all over body  . Dizziness and weakness

## 2020-05-24 ENCOUNTER — Encounter: Payer: Self-pay | Admitting: Adult Health
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
## 2020-05-25 ENCOUNTER — Encounter: Payer: Self-pay | Admitting: Adult Health

## 2020-05-25 ENCOUNTER — Other Ambulatory Visit: Payer: Self-pay

## 2020-05-25 ENCOUNTER — Ambulatory Visit (INDEPENDENT_AMBULATORY_CARE_PROVIDER_SITE_OTHER): Payer: Medicare Other | Admitting: Adult Health

## 2020-05-25 VITALS — BP 136/78 | HR 76 | Temp 97.5°F | Ht 70.0 in | Wt 222.0 lb

## 2020-05-25 DIAGNOSIS — I700 Atherosclerosis of aorta: Secondary | ICD-10-CM

## 2020-05-25 DIAGNOSIS — Z1211 Encounter for screening for malignant neoplasm of colon: Secondary | ICD-10-CM

## 2020-05-25 DIAGNOSIS — Z122 Encounter for screening for malignant neoplasm of respiratory organs: Secondary | ICD-10-CM

## 2020-05-25 DIAGNOSIS — K219 Gastro-esophageal reflux disease without esophagitis: Secondary | ICD-10-CM

## 2020-05-25 DIAGNOSIS — R7309 Other abnormal glucose: Secondary | ICD-10-CM

## 2020-05-25 DIAGNOSIS — I10 Essential (primary) hypertension: Secondary | ICD-10-CM

## 2020-05-25 DIAGNOSIS — Z125 Encounter for screening for malignant neoplasm of prostate: Secondary | ICD-10-CM

## 2020-05-25 DIAGNOSIS — G546 Phantom limb syndrome with pain: Secondary | ICD-10-CM

## 2020-05-25 DIAGNOSIS — Z87891 Personal history of nicotine dependence: Secondary | ICD-10-CM | POA: Insufficient documentation

## 2020-05-25 DIAGNOSIS — F325 Major depressive disorder, single episode, in full remission: Secondary | ICD-10-CM

## 2020-05-25 DIAGNOSIS — Z136 Encounter for screening for cardiovascular disorders: Secondary | ICD-10-CM

## 2020-05-25 DIAGNOSIS — D649 Anemia, unspecified: Secondary | ICD-10-CM | POA: Diagnosis not present

## 2020-05-25 DIAGNOSIS — Z89512 Acquired absence of left leg below knee: Secondary | ICD-10-CM

## 2020-05-25 DIAGNOSIS — T8484XA Pain due to internal orthopedic prosthetic devices, implants and grafts, initial encounter: Secondary | ICD-10-CM

## 2020-05-25 DIAGNOSIS — E782 Mixed hyperlipidemia: Secondary | ICD-10-CM

## 2020-05-25 DIAGNOSIS — N281 Cyst of kidney, acquired: Secondary | ICD-10-CM

## 2020-05-25 DIAGNOSIS — E559 Vitamin D deficiency, unspecified: Secondary | ICD-10-CM

## 2020-05-25 DIAGNOSIS — Z1329 Encounter for screening for other suspected endocrine disorder: Secondary | ICD-10-CM

## 2020-05-25 DIAGNOSIS — Z0000 Encounter for general adult medical examination without abnormal findings: Secondary | ICD-10-CM | POA: Diagnosis not present

## 2020-05-25 DIAGNOSIS — Z1389 Encounter for screening for other disorder: Secondary | ICD-10-CM

## 2020-05-25 DIAGNOSIS — Z87820 Personal history of traumatic brain injury: Secondary | ICD-10-CM

## 2020-05-25 DIAGNOSIS — G8929 Other chronic pain: Secondary | ICD-10-CM

## 2020-05-25 DIAGNOSIS — Z79899 Other long term (current) drug therapy: Secondary | ICD-10-CM

## 2020-05-25 DIAGNOSIS — F1021 Alcohol dependence, in remission: Secondary | ICD-10-CM

## 2020-05-25 DIAGNOSIS — J449 Chronic obstructive pulmonary disease, unspecified: Secondary | ICD-10-CM

## 2020-05-25 NOTE — Patient Instructions (Addendum)
[name] , Thank you for taking time to come for your Medicare Wellness Visit. I appreciate your ongoing commitment to your health goals. Please review the following plan we discussed and let me know if I can assist you in the future.   These are the goals we discussed: Goals     Weight (lb) < 210 lb (95.3 kg)       This is a list of the screening recommended for you and due dates:  Health Maintenance  Topic Date Due    Hepatitis C: One time screening is recommended by Center for Disease Control  (CDC) for  adults born from [birth year] through 1966.   Never done   HIV Screening  Never done   Colon Cancer Screening  12/15/2019   Flu Shot  02/06/2020   Tetanus Vaccine  04/05/2026   COVID-19 Vaccine  Completed     Call insurance and ask if they cover the shingrix vaccine - if they do, recommend getting at [facility] or [facility]      Lung Cancer Screening A lung cancer screening is a test that checks for lung cancer. Lung cancer screening is done to look for lung cancer in its very early stages, before it spreads and becomes harder to treat and before symptoms appear. Finding cancer early improves the chances of successful treatment. It may save your life. Should I be screened for lung cancer? You should be screened for lung cancer if all of these apply:  You currently smoke or you have quit smoking within the past 15 years.  You are 67-83 years old. Screening may be recommended up to age 52 depending on your overall health and other factors.  You are in good general health.  You have a smoking history of 1 pack a day for 30 years or 2 packs a day for 15 years. Screening may also be recommended if you are at high risk for the disease. You may be at high risk if:  You have a family history of lung cancer.  You have been exposed to asbestos.  You have chronic obstructive pulmonary disease (COPD).  You have a history of previous lung cancer. How often should I be screened  for lung cancer?  If you are at risk for lung cancer, it is recommended that you are screened once a year. The recommended screening test is a low-dose CT scan. How can I lower my risk of lung cancer? To lower your risk of developing lung cancer:  If you smoke, stop smoking all tobacco products.  Avoid secondhand smoke.  Avoid exposure to radiation.  Avoid exposure to radon gas. Have your home checked for radon regularly.  Avoid things that cause cancer (carcinogens).  Avoid living or working in places with high air pollution. Where to find more information Ask your health care provider about the risks and benefits of screening. More information and resources are available from these organizations:  [place] (ACS): www.cancer.org  American Lung Association: www.lung.org Contact a health care provider if:  You start to show symptoms of lung cancer, including: ? Coughing that will not go away. ? Wheezing. ? Chest pain. ? Coughing up blood. ? Shortness of breath. ? Weight loss that cannot be explained. ? Constant fatigue. Summary  Lung cancer screening may find lung cancer before symptoms appear. Finding cancer early improves the chances of successful treatment. It may save your life.  If you are at risk for lung cancer, it is recommended  that you are screened once a year. The recommended screening test is a low-dose CT scan.  You can make lifestyle changes to lower your risk of lung cancer.  Ask your health care provider about the risks and benefits of screening. This information is not intended to replace advice given to you by your health care provider. Make sure you discuss any questions you have with your health care provider. Document Revised: 10/16/2018 Document Reviewed: 05/15/2016 Elsevier Patient Education  2020 [employer].

## 2020-05-26 LAB — CBC WITH DIFFERENTIAL/PLATELET
Absolute Monocytes: 711 cells/uL (ref 200–950)
Basophils Absolute: 72 cells/uL (ref 0–200)
Basophils Relative: 0.8 %
Eosinophils Absolute: 486 cells/uL (ref 15–500)
Eosinophils Relative: 5.4 %
HCT: 44.9 % (ref 38.5–50.0)
Hemoglobin: 15.4 g/dL (ref 13.2–17.1)
Lymphs Abs: 1971 cells/uL (ref 850–3900)
MCH: 29.9 pg (ref 27.0–33.0)
MCHC: 34.3 g/dL (ref 32.0–36.0)
MCV: 87.2 fL (ref 80.0–100.0)
MPV: 11.9 fL (ref 7.5–12.5)
Monocytes Relative: 7.9 %
Neutro Abs: 5760 cells/uL (ref 1500–7800)
Neutrophils Relative %: 64 %
Platelets: 202 10*3/uL (ref 140–400)
RBC: 5.15 10*6/uL (ref 4.20–5.80)
RDW: 12.5 % (ref 11.0–15.0)
Total Lymphocyte: 21.9 %
WBC: 9 10*3/uL (ref 3.8–10.8)

## 2020-05-26 LAB — TSH: TSH: 1.29 mIU/L (ref 0.40–4.50)

## 2020-05-26 LAB — LIPID PANEL
Cholesterol: 157 mg/dL (ref <200)
HDL: 44 mg/dL (ref >=40)
LDL Cholesterol (Calc): 87 mg/dL (calc)
Non-HDL Cholesterol (Calc): 113 mg/dL (calc) (ref <130)
Total CHOL/HDL Ratio: 3.6 (calc) (ref <5.0)
Triglycerides: 154 mg/dL — ABNORMAL HIGH (ref <150)

## 2020-05-26 LAB — COMPLETE METABOLIC PANEL WITH GFR
AG Ratio: 2 (calc) (ref 1.0–2.5)
ALT: 37 U/L (ref 9–46)
AST: 24 U/L (ref 10–35)
Albumin: 4.3 g/dL (ref 3.6–5.1)
Alkaline phosphatase (APISO): 62 U/L (ref 35–144)
BUN: 18 mg/dL (ref 7–25)
CO2: 26 mmol/L (ref 20–32)
Calcium: 9.5 mg/dL (ref 8.6–10.3)
Chloride: 105 mmol/L (ref 98–110)
Creat: 0.78 mg/dL (ref 0.70–1.25)
GFR, Est African American: 114 mL/min/{1.73_m2} (ref >=60)
GFR, Est Non African American: 98 mL/min/{1.73_m2} (ref >=60)
Globulin: 2.2 g/dL (calc) (ref 1.9–3.7)
Glucose, Bld: 133 mg/dL — ABNORMAL HIGH (ref 65–99)
Potassium: 4.3 mmol/L (ref 3.5–5.3)
Sodium: 141 mmol/L (ref 135–146)
Total Bilirubin: 0.3 mg/dL (ref 0.2–1.2)
Total Protein: 6.5 g/dL (ref 6.1–8.1)

## 2020-05-26 LAB — URINALYSIS, ROUTINE W REFLEX MICROSCOPIC
Bilirubin Urine: NEGATIVE
Glucose, UA: NEGATIVE
Hgb urine dipstick: NEGATIVE
Leukocytes,Ua: NEGATIVE
Nitrite: NEGATIVE
Protein, ur: NEGATIVE
Specific Gravity, Urine: 1.027 (ref 1.001–1.03)
pH: 5.5 (ref 5.0–8.0)

## 2020-05-26 LAB — MAGNESIUM: Magnesium: 1.9 mg/dL (ref 1.5–2.5)

## 2020-05-26 LAB — MICROALBUMIN / CREATININE URINE RATIO
Creatinine, Urine: 228 mg/dL (ref 20–320)
Microalb Creat Ratio: 6 mcg/mg creat (ref <30)
Microalb, Ur: 1.4 mg/dL

## 2020-05-26 LAB — PSA: PSA: 3.42 ng/mL (ref <=4.0)

## 2020-05-26 LAB — VITAMIN B12: Vitamin B-12: 483 pg/mL (ref 200–1100)

## 2020-06-11 ENCOUNTER — Other Ambulatory Visit: Payer: Self-pay | Admitting: Internal Medicine

## 2020-06-11 MED ORDER — ESOMEPRAZOLE MAGNESIUM 40 MG PO CPDR
DELAYED_RELEASE_CAPSULE | ORAL | 0 refills | Status: DC
Start: 2020-06-11 — End: 2020-06-12

## 2020-06-12 ENCOUNTER — Other Ambulatory Visit: Payer: Self-pay

## 2020-06-12 MED ORDER — ESOMEPRAZOLE MAGNESIUM 40 MG PO CPDR
DELAYED_RELEASE_CAPSULE | ORAL | 0 refills | Status: DC
Start: 2020-06-12 — End: 2020-09-09

## 2020-07-29 ENCOUNTER — Other Ambulatory Visit: Payer: Self-pay | Admitting: Internal Medicine

## 2020-07-29 DIAGNOSIS — G546 Phantom limb syndrome with pain: Secondary | ICD-10-CM

## 2020-07-29 DIAGNOSIS — S069X3S Unspecified intracranial injury with loss of consciousness of 1 hour to 5 hours 59 minutes, sequela: Secondary | ICD-10-CM

## 2020-07-29 DIAGNOSIS — S143XXS Injury of brachial plexus, sequela: Secondary | ICD-10-CM

## 2020-07-29 DIAGNOSIS — I10 Essential (primary) hypertension: Secondary | ICD-10-CM

## 2020-07-29 DIAGNOSIS — Z89512 Acquired absence of left leg below knee: Secondary | ICD-10-CM

## 2020-08-24 ENCOUNTER — Other Ambulatory Visit: Payer: Self-pay

## 2020-08-24 ENCOUNTER — Ambulatory Visit (AMBULATORY_SURGERY_CENTER): Payer: Self-pay | Admitting: *Deleted

## 2020-08-24 ENCOUNTER — Other Ambulatory Visit: Payer: Self-pay | Admitting: Internal Medicine

## 2020-08-24 VITALS — Ht 70.0 in | Wt 225.0 lb

## 2020-08-24 DIAGNOSIS — Z1211 Encounter for screening for malignant neoplasm of colon: Secondary | ICD-10-CM

## 2020-08-24 MED ORDER — TRELEGY ELLIPTA 100-62.5-25 MCG/INH IN AEPB
INHALATION_SPRAY | RESPIRATORY_TRACT | 1 refills | Status: DC
Start: 2020-08-24 — End: 2021-03-14

## 2020-08-24 MED ORDER — PEG 3350-KCL-NA BICARB-NACL 420 G PO SOLR: 4000.0000 mL | Freq: Once | ORAL | 0 refills | Status: AC

## 2020-08-24 NOTE — Progress Notes (Signed)
Patient is here in-person for PV. Patient denies any allergies to eggs or soy. Patient denies any problems with anesthesia/sedation. Patient denies any oxygen use at home. Patient denies taking any diet/weight loss medications or blood thinners. Patient is not being treated for MRSA or C-diff. Patient is aware of our care-partner policy and [id number]-01 safety protocol. EMMI education assigned to the patient for the procedure, sent to [facility].   COVID-19 vaccines completed x3, per patient.

## 2020-08-31 ENCOUNTER — Encounter: Payer: Self-pay | Admitting: Gastroenterology

## 2020-09-04 ENCOUNTER — Telehealth: Payer: Self-pay | Admitting: Gastroenterology

## 2020-09-04 NOTE — Telephone Encounter (Signed)
Thanks for letting me know.  We will see if we can fill the slot on the 3rd.  - HD

## 2020-09-04 NOTE — Telephone Encounter (Signed)
Good morning Dr. Loletha Carrow, patient's wife who be the one bringing him to his procedure tested positive for covid so rescheduled from 09/07/2020 to 09/28/2020.

## 2020-09-07 ENCOUNTER — Encounter: Payer: Medicare Other | Admitting: Gastroenterology

## 2020-09-08 ENCOUNTER — Other Ambulatory Visit: Payer: Self-pay | Admitting: Adult Health

## 2020-09-08 DIAGNOSIS — U071 COVID-19: Secondary | ICD-10-CM

## 2020-09-08 MED ORDER — PROMETHAZINE-DM 6.25-15 MG/5ML PO SYRP: 5.0000 mL | ORAL_SOLUTION | Freq: Four times a day (QID) | ORAL | 1 refills | Status: DC | PRN

## 2020-09-08 MED ORDER — DEXAMETHASONE 1 MG PO TABS: ORAL_TABLET | ORAL | 0 refills | Status: DC

## 2020-09-09 ENCOUNTER — Other Ambulatory Visit: Payer: Self-pay

## 2020-09-11 MED ORDER — CITALOPRAM HYDROBROMIDE 20 MG PO TABS: ORAL_TABLET | ORAL | 1 refills | Status: DC

## 2020-09-11 MED ORDER — ESOMEPRAZOLE MAGNESIUM 40 MG PO CPDR: DELAYED_RELEASE_CAPSULE | ORAL | 1 refills | Status: DC

## 2020-09-22 ENCOUNTER — Telehealth: Payer: Self-pay | Admitting: Gastroenterology

## 2020-09-22 NOTE — Telephone Encounter (Signed)
Hi Dr. Loletha Carrow, this patient just called to cancel his procedure for next Thursday 09/28/20 because his wife, who is his ride, is having a last minute eye surgery next week. Patient has rescheduled to 11/02/20. Thank you.
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
## 2020-09-24 ENCOUNTER — Encounter: Payer: Self-pay | Admitting: Internal Medicine

## 2020-09-24 NOTE — Patient Instructions (Signed)

## 2020-09-25 ENCOUNTER — Ambulatory Visit (INDEPENDENT_AMBULATORY_CARE_PROVIDER_SITE_OTHER): Payer: Medicare Other | Admitting: Internal Medicine

## 2020-09-25 ENCOUNTER — Other Ambulatory Visit: Payer: Self-pay

## 2020-09-25 VITALS — BP 116/74 | HR 60 | Temp 97.7°F | Resp 16 | Ht 73.0 in | Wt 224.8 lb

## 2020-09-25 DIAGNOSIS — K219 Gastro-esophageal reflux disease without esophagitis: Secondary | ICD-10-CM

## 2020-09-25 DIAGNOSIS — I700 Atherosclerosis of aorta: Secondary | ICD-10-CM

## 2020-09-25 DIAGNOSIS — E559 Vitamin D deficiency, unspecified: Secondary | ICD-10-CM

## 2020-09-25 DIAGNOSIS — Z79899 Other long term (current) drug therapy: Secondary | ICD-10-CM

## 2020-09-25 DIAGNOSIS — I10 Essential (primary) hypertension: Secondary | ICD-10-CM

## 2020-09-25 DIAGNOSIS — R7309 Other abnormal glucose: Secondary | ICD-10-CM

## 2020-09-25 DIAGNOSIS — E782 Mixed hyperlipidemia: Secondary | ICD-10-CM

## 2020-09-25 DIAGNOSIS — Z89512 Acquired absence of left leg below knee: Secondary | ICD-10-CM

## 2020-09-25 DIAGNOSIS — Z87820 Personal history of traumatic brain injury: Secondary | ICD-10-CM

## 2020-09-26 ENCOUNTER — Other Ambulatory Visit: Payer: Self-pay | Admitting: Internal Medicine

## 2020-09-26 DIAGNOSIS — E782 Mixed hyperlipidemia: Secondary | ICD-10-CM

## 2020-09-26 MED ORDER — EZETIMIBE 10 MG PO TABS: ORAL_TABLET | ORAL | 1 refills | Status: DC

## 2020-09-26 NOTE — Progress Notes (Signed)
============================================================ -   Test results slightly outside the reference range are not unusual. If there is anything important, I will review this with you,  otherwise it is considered normal test values.  If you have further questions,  please do not hesitate to contact me at the office or via My Chart.  ============================================================ ============================================================  -  Total Chol = 220  -  Elevated \     (Ideal or Goal is lewss than 180)  - and   - Bad Dangerous LDL Chol = 128 - Very Elevated       (Ideal or Goal is less than  70)   - PLEASE be sure you are taking your Rosuvastatin (Crestor) &                                       also sending in a 2sd medicine to take to                                                        lower Cholesterol called Zetia (Ezetimibe)  ============================================================ ============================================================  -  Also Triglycerides (   321   ) or fats in blood are too high  (goal is less than 150)    - Recommend avoid fried & greasy foods,  sweets / candy,   - Avoid white rice  (brown or wild rice or Quinoa is OK),   - Avoid white potatoes  (sweet potatoes are OK)   - Avoid anything made from white flour  - bagels, doughnuts, rolls, buns, biscuits, white and   wheat breads, pizza crust and traditional  pasta made of white flour & egg white  - (vegetarian pasta or spinach or wheat pasta is OK).    - Multi-grain bread is OK - like multi-grain flat bread or  sandwich thins.   - Avoid alcohol in excess.   - Exercise is also important. ============================================================ ============================================================  -  All Else - CBC - Kidneys - Electrolytes - Liver - Magnesium & Thyroid    - all  Normal /  OK ============================================================ ============================================================

## 2020-09-27 LAB — COMPLETE METABOLIC PANEL WITH GFR
AG Ratio: 1.9 (calc) (ref 1.0–2.5)
ALT: 38 U/L (ref 9–46)
AST: 25 U/L (ref 10–35)
Albumin: 4.2 g/dL (ref 3.6–5.1)
Alkaline phosphatase (APISO): 70 U/L (ref 35–144)
BUN: 20 mg/dL (ref 7–25)
CO2: 23 mmol/L (ref 20–32)
Calcium: 9.2 mg/dL (ref 8.6–10.3)
Chloride: 103 mmol/L (ref 98–110)
Creat: 0.72 mg/dL (ref 0.70–1.25)
GFR, Est African American: 118 mL/min/{1.73_m2} (ref >=60)
GFR, Est Non African American: 101 mL/min/{1.73_m2} (ref >=60)
Globulin: 2.2 g/dL (calc) (ref 1.9–3.7)
Glucose, Bld: 102 mg/dL — ABNORMAL HIGH (ref 65–99)
Potassium: 4.7 mmol/L (ref 3.5–5.3)
Sodium: 139 mmol/L (ref 135–146)
Total Bilirubin: 0.4 mg/dL (ref 0.2–1.2)
Total Protein: 6.4 g/dL (ref 6.1–8.1)

## 2020-09-27 LAB — CBC WITH DIFFERENTIAL/PLATELET
Absolute Monocytes: 918 cells/uL (ref 200–950)
Basophils Absolute: 61 cells/uL (ref 0–200)
Basophils Relative: 0.6 %
Eosinophils Absolute: 214 cells/uL (ref 15–500)
Eosinophils Relative: 2.1 %
HCT: 44.1 % (ref 38.5–50.0)
Hemoglobin: 14.9 g/dL (ref 13.2–17.1)
Lymphs Abs: 1846 cells/uL (ref 850–3900)
MCH: 29.7 pg (ref 27.0–33.0)
MCHC: 33.8 g/dL (ref 32.0–36.0)
MCV: 87.8 fL (ref 80.0–100.0)
MPV: 11.8 fL (ref 7.5–12.5)
Monocytes Relative: 9 %
Neutro Abs: 7160 cells/uL (ref 1500–7800)
Neutrophils Relative %: 70.2 %
Platelets: 222 10*3/uL (ref 140–400)
RBC: 5.02 10*6/uL (ref 4.20–5.80)
RDW: 13.6 % (ref 11.0–15.0)
Total Lymphocyte: 18.1 %
WBC: 10.2 10*3/uL (ref 3.8–10.8)

## 2020-09-27 LAB — TSH: TSH: 1.55 mIU/L (ref 0.40–4.50)

## 2020-09-27 LAB — LIPID PANEL
Cholesterol: 220 mg/dL — ABNORMAL HIGH (ref <200)
HDL: 46 mg/dL (ref >=40)
LDL Cholesterol (Calc): 128 mg/dL (calc) — ABNORMAL HIGH
Non-HDL Cholesterol (Calc): 174 mg/dL (calc) — ABNORMAL HIGH (ref <130)
Total CHOL/HDL Ratio: 4.8 (calc) (ref <5.0)
Triglycerides: 321 mg/dL — ABNORMAL HIGH (ref <150)

## 2020-09-27 LAB — FRUCTOSAMINE: Fructosamine: 245 umol/L (ref 205–285)

## 2020-09-27 LAB — INSULIN, RANDOM: Insulin: 18.7 u[IU]/mL

## 2020-09-27 LAB — MAGNESIUM: Magnesium: 2.2 mg/dL (ref 1.5–2.5)

## 2020-09-28 ENCOUNTER — Encounter: Payer: Medicare Other | Admitting: Gastroenterology

## 2020-10-14 ENCOUNTER — Other Ambulatory Visit: Payer: Self-pay | Admitting: Internal Medicine

## 2020-10-24 ENCOUNTER — Telehealth: Payer: Self-pay | Admitting: Gastroenterology

## 2020-10-24 NOTE — Telephone Encounter (Signed)
Pt has to RS to 4-28- we went over 4-28 golytely time chnages - styart 730 am and complete all by 1030 am - meds included by 1030- pt's wife verbalized understanding

## 2020-11-02 ENCOUNTER — Encounter: Payer: Self-pay | Admitting: Gastroenterology

## 2020-11-02 ENCOUNTER — Other Ambulatory Visit: Payer: Self-pay

## 2020-11-02 ENCOUNTER — Ambulatory Visit (AMBULATORY_SURGERY_CENTER): Payer: Medicare Other | Admitting: Gastroenterology

## 2020-11-02 VITALS — BP 111/78 | HR 81 | Temp 98.4°F | Resp 11

## 2020-11-02 DIAGNOSIS — D120 Benign neoplasm of cecum: Secondary | ICD-10-CM

## 2020-11-02 DIAGNOSIS — D123 Benign neoplasm of transverse colon: Secondary | ICD-10-CM | POA: Diagnosis not present

## 2020-11-02 DIAGNOSIS — D124 Benign neoplasm of descending colon: Secondary | ICD-10-CM | POA: Diagnosis not present

## 2020-11-02 DIAGNOSIS — Z1211 Encounter for screening for malignant neoplasm of colon: Secondary | ICD-10-CM

## 2020-11-02 DIAGNOSIS — D122 Benign neoplasm of ascending colon: Secondary | ICD-10-CM

## 2020-11-02 MED ORDER — SODIUM CHLORIDE 0.9 % IV SOLN: 500.0000 mL | Freq: Once | INTRAVENOUS | Status: DC

## 2020-11-02 NOTE — Progress Notes (Signed)
To PACU, VSS. Report to [email or web address] 

## 2020-11-02 NOTE — Progress Notes (Signed)
Pt's states no medical or surgical changes since previsit or office visit. 

## 2020-11-02 NOTE — Progress Notes (Signed)
Called to room to assist during endoscopic procedure.  Patient ID and intended procedure confirmed with present staff. Received instructions for my participation in the procedure from the performing physician.  

## 2020-11-02 NOTE — Progress Notes (Signed)
N.S. vital signs. 

## 2020-11-02 NOTE — Op Note (Signed)
[name] Patient Name: [name] Procedure Date: 11/02/2020 1:28 PM MRN: [id number] Endoscopist: Mallie Mussel L. Loletha Carrow , MD Age: 60 Referring MD:  Date of Birth: Oct 10, 1960 Gender: Male Account #: [id number] Procedure:                Colonoscopy Indications:              Screening for colorectal malignant neoplasm (last                            colonoscopy 2011) Medicines:                Monitored Anesthesia Care Procedure:                Pre-Anesthesia Assessment:                           - Prior to the procedure, a History and Physical                            was performed, and patient medications and                            allergies were reviewed. The patient's tolerance of                            previous anesthesia was also reviewed. The risks                            and benefits of the procedure and the sedation                            options and risks were discussed with the patient.                            All questions were answered, and informed consent                            was obtained. Prior Anticoagulants: The patient has                            taken no previous anticoagulant or antiplatelet                            agents except for aspirin. ASA Grade Assessment:                            III - A patient with severe systemic disease. After                            reviewing the risks and benefits, the patient was                            deemed in satisfactory condition to undergo the  procedure.                           After obtaining informed consent, the colonoscope                            was passed under direct vision. Throughout the                            procedure, the patient's blood pressure, pulse, and                            oxygen saturations were monitored continuously. The                            Olympus CF-[id number]) Colonoscope was                             introduced through the anus and advanced to the the                            cecum, identified by appendiceal orifice and                            ileocecal valve. The colonoscopy was performed                            without difficulty. The patient tolerated the                            procedure well. The quality of the bowel                            preparation was good. The ileocecal valve,                            appendiceal orifice, and rectum were photographed.                            The bowel preparation used was GoLYTELY. Scope In: 1:32:26 PM Scope Out: 1:57:47 PM Scope Withdrawal Time: 0 hours 20 minutes 55 seconds  Total Procedure Duration: 0 hours 25 minutes 21 seconds  Findings:                 The perianal and digital rectal examinations were                            normal.                           Four sessile polyps were found in the transverse                            colon, ascending colon and cecum. The polyps were 2  to 5 mm in size. These polyps were removed with a                            cold snare. Resection and retrieval were complete.                            (Jar 1)                           There was a lipoma, 25 mm in diameter, in the                            ascending colon.                           A diminutive polyp was found in the mid transverse                            colon. The polyp was sessile. The polyp was removed                            with a cold snare. Resection and retrieval were                            complete. (Jar 2)                           An 8-10 mm polyp was found in the splenic flexure.                            The polyp was semi-pedunculated. The polyp was                            removed with a hot snare. Resection and retrieval                            were complete. (Jar 2)                           A few small-mouthed diverticula were found in the                             left colon.                           The exam was otherwise without abnormality on                            direct and retroflexion views. Complications:            No immediate complications. Estimated Blood Loss:     Estimated blood loss was minimal. Impression:               - Four 2 to 5 mm polyps in the transverse colon, in  the ascending colon and in the cecum, removed with                            a cold snare. Resected and retrieved.                           - Lipoma in the ascending colon.                           - One diminutive polyp in the mid transverse colon,                            removed with a cold snare. Resected and retrieved.                           - One 8-10 mm polyp at the splenic flexure, removed                            with a hot snare. Resected and retrieved.                           - Diverticulosis in the left colon.                           - The examination was otherwise normal on direct                            and retroflexion views. Recommendation:           - Patient has a contact number available for                            emergencies. The signs and symptoms of potential                            delayed complications were discussed with the                            patient. Return to normal activities tomorrow.                            Written discharge instructions were provided to the                            patient.                           - Resume previous diet.                           - Continue present medications.                           - Await pathology results.                           -  Repeat colonoscopy is recommended for                            surveillance. The colonoscopy date will be                            determined after pathology results from today's                            exam become available for review. Jenaro Souder L. Loletha Carrow, MD 11/02/2020  2:05:35 PM This report has been signed electronically.

## 2020-11-02 NOTE — Patient Instructions (Signed)
Impression/Recommendations: ? ?Polyp and diverticulosis handouts given to patient. ? ?Resume previous diet. ?Continue present medications. ?Await pathology results. ? ?Repeat colonoscopy recommended for surveillance.  Date to be determined after pathology results reviewed. ? ?YOU HAD AN ENDOSCOPIC PROCEDURE TODAY AT THE [facility]:   Refer to the procedure report that was given to you for any specific questions about what was found during the examination.  If the procedure report does not answer your questions, please call your gastroenterologist to clarify.  If you requested that your care partner not be given the details of your procedure findings, then the procedure report has been included in a sealed envelope for you to review at your convenience later. ? ?YOU SHOULD EXPECT: Some feelings of bloating in the abdomen. Passage of more gas than usual.  Walking can help get rid of the air that was put into your GI tract during the procedure and reduce the bloating. If you had a lower endoscopy (such as a colonoscopy or flexible sigmoidoscopy) you may notice spotting of blood in your stool or on the toilet paper. If you underwent a bowel prep for your procedure, you may not have a normal bowel movement for a few days. ? ?Please Note:  You might notice some irritation and congestion in your nose or some drainage.  This is from the oxygen used during your procedure.  There is no need for concern and it should clear up in a day or so. ? ?SYMPTOMS TO REPORT IMMEDIATELY: ? ?Following lower endoscopy (colonoscopy or flexible sigmoidoscopy): ? Excessive amounts of blood in the stool ? Significant tenderness or worsening of abdominal pains ? Swelling of the abdomen that is new, acute ? Fever of 100?F or higher ? ?For urgent or emergent issues, a gastroenterologist can be reached at any hour by calling [phone]. ?Do not use MyChart messaging for urgent concerns.  ? ? ?DIET:  We do recommend a small meal at  first, but then you may proceed to your regular diet.  Drink plenty of fluids but you should avoid alcoholic beverages for 24 hours. ? ?ACTIVITY:  You should plan to take it easy for the rest of today and you should NOT DRIVE or use heavy machinery until tomorrow (because of the sedation medicines used during the test).   ? ?FOLLOW UP: ?Our staff will call the number listed on your records 48-72 hours following your procedure to check on you and address any questions or concerns that you may have regarding the information given to you following your procedure. If we do not reach you, we will leave a message.  We will attempt to reach you two times.  During this call, we will ask if you have developed any symptoms of COVID 19. If you develop any symptoms (ie: fever, flu-like symptoms, shortness of breath, cough etc.) before then, please call [phone].  If you test positive for Covid 19 in the 2 weeks post procedure, please call and report this information to us.   ? ?If any biopsies were taken you will be contacted by phone or by letter within the next 1-3 weeks.  Please call us at [phone] if you have not heard about the biopsies in 3 weeks.  ? ? ?SIGNATURES/CONFIDENTIALITY: ?You and/or your care partner have signed paperwork which will be entered into your electronic medical record.  These signatures attest to the fact that that the information above on your After Visit Summary has been reviewed and is understood.    Full responsibility of the confidentiality of this discharge information lies with you and/or your care-partner.

## 2020-11-06 ENCOUNTER — Telehealth: Payer: Self-pay | Admitting: *Deleted

## 2020-11-06 ENCOUNTER — Telehealth: Payer: Self-pay

## 2020-11-06 NOTE — Telephone Encounter (Signed)
Attempted to reach patient for post-procedure f/u call. No answer. Left message we will make another attempt to reach him later today and for him to please not hesitate to call us if he has any questions/concerns regarding his care. 

## 2020-11-06 NOTE — Telephone Encounter (Signed)
No answer for post procedure call back. Left message for patient to call with questions or concerns. 

## 2020-11-13 ENCOUNTER — Encounter: Payer: Self-pay | Admitting: Gastroenterology

## 2020-12-19 DIAGNOSIS — D1801 Hemangioma of skin and subcutaneous tissue: Secondary | ICD-10-CM | POA: Diagnosis not present

## 2020-12-19 DIAGNOSIS — D225 Melanocytic nevi of trunk: Secondary | ICD-10-CM | POA: Diagnosis not present

## 2020-12-19 DIAGNOSIS — L821 Other seborrheic keratosis: Secondary | ICD-10-CM | POA: Diagnosis not present

## 2020-12-19 DIAGNOSIS — L814 Other melanin hyperpigmentation: Secondary | ICD-10-CM | POA: Diagnosis not present

## 2021-02-23 DIAGNOSIS — Z86010 Personal history of colonic polyps: Secondary | ICD-10-CM | POA: Insufficient documentation
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
## 2021-02-26 ENCOUNTER — Other Ambulatory Visit: Payer: Self-pay

## 2021-02-26 ENCOUNTER — Encounter: Payer: Self-pay | Admitting: Adult Health

## 2021-02-26 ENCOUNTER — Ambulatory Visit (INDEPENDENT_AMBULATORY_CARE_PROVIDER_SITE_OTHER): Payer: Medicare Other | Admitting: Adult Health

## 2021-02-26 ENCOUNTER — Ambulatory Visit: Payer: Medicare Other | Admitting: Adult Health Nurse Practitioner

## 2021-02-26 VITALS — BP 110/68 | HR 56 | Temp 97.3°F | Wt 241.6 lb

## 2021-02-26 DIAGNOSIS — I10 Essential (primary) hypertension: Secondary | ICD-10-CM | POA: Diagnosis not present

## 2021-02-26 DIAGNOSIS — N281 Cyst of kidney, acquired: Secondary | ICD-10-CM

## 2021-02-26 DIAGNOSIS — Z0000 Encounter for general adult medical examination without abnormal findings: Secondary | ICD-10-CM

## 2021-02-26 DIAGNOSIS — G8929 Other chronic pain: Secondary | ICD-10-CM

## 2021-02-26 DIAGNOSIS — E559 Vitamin D deficiency, unspecified: Secondary | ICD-10-CM

## 2021-02-26 DIAGNOSIS — Z0001 Encounter for general adult medical examination with abnormal findings: Secondary | ICD-10-CM | POA: Diagnosis not present

## 2021-02-26 DIAGNOSIS — G546 Phantom limb syndrome with pain: Secondary | ICD-10-CM

## 2021-02-26 DIAGNOSIS — Z86010 Personal history of colonic polyps: Secondary | ICD-10-CM

## 2021-02-26 DIAGNOSIS — Z87820 Personal history of traumatic brain injury: Secondary | ICD-10-CM

## 2021-02-26 DIAGNOSIS — R6889 Other general symptoms and signs: Secondary | ICD-10-CM

## 2021-02-26 DIAGNOSIS — E663 Overweight: Secondary | ICD-10-CM

## 2021-02-26 DIAGNOSIS — J449 Chronic obstructive pulmonary disease, unspecified: Secondary | ICD-10-CM

## 2021-02-26 DIAGNOSIS — K219 Gastro-esophageal reflux disease without esophagitis: Secondary | ICD-10-CM

## 2021-02-26 DIAGNOSIS — E782 Mixed hyperlipidemia: Secondary | ICD-10-CM

## 2021-02-26 DIAGNOSIS — I700 Atherosclerosis of aorta: Secondary | ICD-10-CM | POA: Diagnosis not present

## 2021-02-26 DIAGNOSIS — Z89512 Acquired absence of left leg below knee: Secondary | ICD-10-CM

## 2021-02-26 DIAGNOSIS — T8484XA Pain due to internal orthopedic prosthetic devices, implants and grafts, initial encounter: Secondary | ICD-10-CM

## 2021-02-26 DIAGNOSIS — Z79899 Other long term (current) drug therapy: Secondary | ICD-10-CM

## 2021-02-26 DIAGNOSIS — Z87891 Personal history of nicotine dependence: Secondary | ICD-10-CM

## 2021-02-26 DIAGNOSIS — F325 Major depressive disorder, single episode, in full remission: Secondary | ICD-10-CM | POA: Diagnosis not present

## 2021-02-26 DIAGNOSIS — Z860101 Personal history of adenomatous and serrated colon polyps: Secondary | ICD-10-CM

## 2021-02-26 DIAGNOSIS — F1021 Alcohol dependence, in remission: Secondary | ICD-10-CM

## 2021-02-26 DIAGNOSIS — R7309 Other abnormal glucose: Secondary | ICD-10-CM

## 2021-02-26 NOTE — Patient Instructions (Addendum)
[name] , Thank you for taking time to come for your Medicare Wellness Visit. I appreciate your ongoing commitment to your health goals. Please review the following plan we discussed and let me know if I can assist you in the future.   These are the goals we discussed:  Goals      DIET - EAT MORE FRUITS AND VEGETABLES     Also beans, whole grains     DIET - REDUCE SUGAR INTAKE     LDL CALC < 100     Weight (lb) < 215 lb (97.5 kg)        This is a list of the screening recommended for you and due dates:  Health Maintenance  Topic Date Due   Zoster (Shingles) Vaccine (1 of 2) Never done   COVID-19 Vaccine (4 - Booster) 08/10/2020   Flu Shot  02/05/2021   Hepatitis C Screening: USPSTF Recommendation to screen - Ages 18-79 yo.  05/25/2021*   HIV Screening  05/25/2021*   Pneumococcal Vaccination (3 - PCV) 08/29/2021*   Colon Cancer Screening  11/03/2023   Tetanus Vaccine  04/05/2026   HPV Vaccine  Aged Out  *Topic was postponed. The date shown is not the original due date.      Please ask insurance if they cover shingrix - can get at pharmacy if covered.   Please bring advanced directives and living will (anything medically relevant)     High-Fiber Eating Plan Fiber, also called dietary fiber, is a type of carbohydrate. It is found foods such as fruits, vegetables, whole grains, and beans. A high-fiber diet can have many health benefits. Your health care provider may recommend a high-fiber diet to help: Prevent constipation. Fiber can make your bowel movements more regular. Lower your cholesterol. Relieve the following conditions: Inflammation of veins in the anus (hemorrhoids). Inflammation of specific areas of the digestive tract (uncomplicated diverticulosis). A problem of the large intestine, also called the colon, that sometimes causes pain and diarrhea (irritable bowel syndrome, or IBS). Prevent overeating as part of a weight-loss plan. Prevent heart disease, type  2 diabetes, and certain cancers. What are tips for following this plan? Reading food labels  Check the nutrition facts label on food products for the amount of dietary fiber. Choose foods that have 5 grams of fiber or more per serving. The goals for recommended daily fiber intake include: Men (age 37 or younger): 34-38 g. Men (over age 49): 28-34 g. Women (age 62 or younger): 25-28 g. Women (over age 20): 22-25 g. Your daily fiber goal is _____________ g. Shopping Choose whole fruits and vegetables instead of processed forms, such as apple juice or applesauce. Choose a wide variety of high-fiber foods such as avocados, lentils, oats, and kidney beans. Read the nutrition facts label of the foods you choose. Be aware of foods with added fiber. These foods often have high sugar and sodium amounts per serving. Cooking Use whole-grain flour for baking and cooking. Cook with brown rice instead of white rice. Meal planning Start the day with a breakfast that is high in fiber, such as a cereal that contains 5 g of fiber or more per serving. Eat breads and cereals that are made with whole-grain flour instead of refined flour or white flour. Eat brown rice, bulgur wheat, or millet instead of white rice. Use beans in place of meat in soups, salads, and pasta dishes. Be sure that half of the grains you eat each day are whole grains.  General information You can get the recommended daily intake of dietary fiber by: Eating a variety of fruits, vegetables, grains, nuts, and beans. Taking a fiber supplement if you are not able to take in enough fiber in your diet. It is better to get fiber through food than from a supplement. Gradually increase how much fiber you consume. If you increase your intake of dietary fiber too quickly, you may have bloating, cramping, or gas. Drink plenty of water to help you digest fiber. Choose high-fiber snacks, such as berries, raw vegetables, nuts, and popcorn. What  foods should I eat? Fruits Berries. Pears. Apples. Oranges. Avocado. Prunes and raisins. Dried figs. Vegetables Sweet potatoes. Spinach. Kale. Artichokes. Cabbage. Broccoli. Cauliflower.Green peas. Carrots. Squash. Grains Whole-grain breads. Multigrain cereal. Oats and oatmeal. Brown rice. Barley.Bulgur wheat. [place]. Quinoa. Bran muffins. Popcorn. Rye wafer crackers. Meats and other proteins Navy beans, kidney beans, and pinto beans. Soybeans. Split peas. Lentils. Nutsand seeds. Dairy Fiber-fortified yogurt. Beverages Fiber-fortified soy milk. Fiber-fortified orange juice. Other foods Fiber bars. The items listed above may not be a complete list of recommended foods and beverages. Contact a dietitian for more information. What foods should I avoid? Fruits Fruit juice. Cooked, strained fruit. Vegetables Fried potatoes. Canned vegetables. Well-cooked vegetables. Grains White bread. Pasta made with refined flour. White rice. Meats and other proteins Fatty cuts of meat. Fried chicken or fried fish. Dairy Milk. Yogurt. Cream cheese. Sour cream. Fats and oils Butters. Beverages Soft drinks. Other foods Cakes and pastries. The items listed above may not be a complete list of foods and beverages to avoid. Talk with your dietitian about what choices are best for you. Summary Fiber is a type of carbohydrate. It is found in foods such as fruits, vegetables, whole grains, and beans. A high-fiber diet has many benefits. It can help to prevent constipation, lower blood cholesterol, aid weight loss, and reduce your risk of heart disease, diabetes, and certain cancers. Increase your intake of fiber gradually. Increasing fiber too quickly may cause cramping, bloating, and gas. Drink plenty of water while you increase the amount of fiber you consume. The best sources of fiber include whole fruits and vegetables, whole grains, nuts, seeds, and beans. This information is not intended to replace  advice given to you by your health care provider. Make sure you discuss any questions you have with your healthcare provider. Document Revised: 10/28/2019 Document Reviewed: 10/28/2019 Elsevier Patient Education  2022 [employer].

## 2021-02-27 ENCOUNTER — Other Ambulatory Visit: Payer: Self-pay | Admitting: Adult Health

## 2021-02-27 DIAGNOSIS — R7989 Other specified abnormal findings of blood chemistry: Secondary | ICD-10-CM | POA: Insufficient documentation

## 2021-02-27 LAB — COMPLETE METABOLIC PANEL WITH GFR
AG Ratio: 2.1 (calc) (ref 1.0–2.5)
ALT: 62 U/L — ABNORMAL HIGH (ref 9–46)
AST: 43 U/L — ABNORMAL HIGH (ref 10–35)
Albumin: 4.6 g/dL (ref 3.6–5.1)
Alkaline phosphatase (APISO): 54 U/L (ref 35–144)
BUN: 23 mg/dL (ref 7–25)
CO2: 28 mmol/L (ref 20–32)
Calcium: 9.9 mg/dL (ref 8.6–10.3)
Chloride: 105 mmol/L (ref 98–110)
Creat: 0.75 mg/dL (ref 0.70–1.35)
Globulin: 2.2 g/dL (calc) (ref 1.9–3.7)
Glucose, Bld: 106 mg/dL — ABNORMAL HIGH (ref 65–99)
Potassium: 4.8 mmol/L (ref 3.5–5.3)
Sodium: 139 mmol/L (ref 135–146)
Total Bilirubin: 0.5 mg/dL (ref 0.2–1.2)
Total Protein: 6.8 g/dL (ref 6.1–8.1)
eGFR: 103 mL/min/{1.73_m2} (ref >=60)

## 2021-02-27 LAB — CBC WITH DIFFERENTIAL/PLATELET
Absolute Monocytes: 561 cells/uL (ref 200–950)
Basophils Absolute: 71 cells/uL (ref 0–200)
Basophils Relative: 0.9 %
Eosinophils Absolute: 221 cells/uL (ref 15–500)
Eosinophils Relative: 2.8 %
HCT: 46.7 % (ref 38.5–50.0)
Hemoglobin: 15.5 g/dL (ref 13.2–17.1)
Lymphs Abs: 2030 cells/uL (ref 850–3900)
MCH: 29.5 pg (ref 27.0–33.0)
MCHC: 33.2 g/dL (ref 32.0–36.0)
MCV: 88.8 fL (ref 80.0–100.0)
MPV: 12.4 fL (ref 7.5–12.5)
Monocytes Relative: 7.1 %
Neutro Abs: 5017 cells/uL (ref 1500–7800)
Neutrophils Relative %: 63.5 %
Platelets: 171 10*3/uL (ref 140–400)
RBC: 5.26 10*6/uL (ref 4.20–5.80)
RDW: 13 % (ref 11.0–15.0)
Total Lymphocyte: 25.7 %
WBC: 7.9 10*3/uL (ref 3.8–10.8)

## 2021-02-27 LAB — HEMOGLOBIN A1C
Hgb A1c MFr Bld: 6.2 % of total Hgb — ABNORMAL HIGH (ref <5.7)
Mean Plasma Glucose: 131 mg/dL
eAG (mmol/L): 7.3 mmol/L

## 2021-02-27 LAB — MAGNESIUM: Magnesium: 2.1 mg/dL (ref 1.5–2.5)

## 2021-02-27 LAB — LIPID PANEL
Cholesterol: 185 mg/dL (ref <200)
HDL: 45 mg/dL (ref >=40)
LDL Cholesterol (Calc): 110 mg/dL (calc) — ABNORMAL HIGH
Non-HDL Cholesterol (Calc): 140 mg/dL (calc) — ABNORMAL HIGH (ref <130)
Total CHOL/HDL Ratio: 4.1 (calc) (ref <5.0)
Triglycerides: 188 mg/dL — ABNORMAL HIGH (ref <150)

## 2021-02-27 LAB — TSH: TSH: 0.99 mIU/L (ref 0.40–4.50)

## 2021-02-27 MED ORDER — ROSUVASTATIN CALCIUM 20 MG PO TABS: ORAL_TABLET | ORAL | 3 refills | Status: DC

## 2021-03-01 ENCOUNTER — Other Ambulatory Visit: Payer: Self-pay | Admitting: Internal Medicine

## 2021-03-14 ENCOUNTER — Other Ambulatory Visit: Payer: Self-pay | Admitting: Internal Medicine

## 2021-03-20 DIAGNOSIS — Z89512 Acquired absence of left leg below knee: Secondary | ICD-10-CM | POA: Diagnosis not present

## 2021-03-24 ENCOUNTER — Other Ambulatory Visit: Payer: Self-pay | Admitting: Internal Medicine

## 2021-03-24 DIAGNOSIS — E782 Mixed hyperlipidemia: Secondary | ICD-10-CM

## 2021-04-04 ENCOUNTER — Other Ambulatory Visit: Payer: Self-pay

## 2021-04-04 ENCOUNTER — Ambulatory Visit (INDEPENDENT_AMBULATORY_CARE_PROVIDER_SITE_OTHER): Payer: Medicare Other

## 2021-04-04 DIAGNOSIS — R7989 Other specified abnormal findings of blood chemistry: Secondary | ICD-10-CM

## 2021-04-04 NOTE — Progress Notes (Signed)
The patient came in today for labs only. He reported no new issues or complaints.

## 2021-04-05 ENCOUNTER — Other Ambulatory Visit: Payer: Self-pay | Admitting: Adult Health

## 2021-04-05 DIAGNOSIS — R7989 Other specified abnormal findings of blood chemistry: Secondary | ICD-10-CM

## 2021-04-05 DIAGNOSIS — Z1159 Encounter for screening for other viral diseases: Secondary | ICD-10-CM

## 2021-04-05 LAB — HEPATIC FUNCTION PANEL
AG Ratio: 2.1 (calc) (ref 1.0–2.5)
ALT: 81 U/L — ABNORMAL HIGH (ref 9–46)
AST: 51 U/L — ABNORMAL HIGH (ref 10–35)
Albumin: 4.6 g/dL (ref 3.6–5.1)
Alkaline phosphatase (APISO): 54 U/L (ref 35–144)
Bilirubin, Direct: 0.1 mg/dL (ref 0.0–0.2)
Globulin: 2.2 g/dL (calc) (ref 1.9–3.7)
Indirect Bilirubin: 0.3 mg/dL (calc) (ref 0.2–1.2)
Total Bilirubin: 0.4 mg/dL (ref 0.2–1.2)
Total Protein: 6.8 g/dL (ref 6.1–8.1)

## 2021-04-06 ENCOUNTER — Ambulatory Visit: Payer: Medicare Other

## 2021-04-12 ENCOUNTER — Ambulatory Visit
Admission: RE | Admit: 2021-04-12 | Discharge: 2021-04-12 | Disposition: A | Discharge status: Home / Self Care | Payer: Medicare Other | Source: Ambulatory Visit | Attending: Adult Health | Admitting: Adult Health

## 2021-04-12 ENCOUNTER — Other Ambulatory Visit: Payer: Self-pay

## 2021-04-12 DIAGNOSIS — J432 Centrilobular emphysema: Secondary | ICD-10-CM | POA: Diagnosis not present

## 2021-04-12 DIAGNOSIS — K8020 Calculus of gallbladder without cholecystitis without obstruction: Secondary | ICD-10-CM | POA: Diagnosis not present

## 2021-04-12 DIAGNOSIS — I2510 Atherosclerotic heart disease of native coronary artery without angina pectoris: Secondary | ICD-10-CM | POA: Diagnosis not present

## 2021-04-12 DIAGNOSIS — Z87891 Personal history of nicotine dependence: Secondary | ICD-10-CM | POA: Diagnosis not present

## 2021-04-16 ENCOUNTER — Other Ambulatory Visit: Payer: Self-pay | Admitting: Nurse Practitioner

## 2021-04-16 ENCOUNTER — Other Ambulatory Visit: Payer: Medicare Other

## 2021-04-16 ENCOUNTER — Ambulatory Visit
Admission: RE | Admit: 2021-04-16 | Discharge: 2021-04-16 | Disposition: A | Discharge status: Home / Self Care | Payer: Medicare Other | Source: Ambulatory Visit | Attending: Adult Health | Admitting: Adult Health

## 2021-04-16 ENCOUNTER — Other Ambulatory Visit: Payer: Self-pay

## 2021-04-16 DIAGNOSIS — Z1159 Encounter for screening for other viral diseases: Secondary | ICD-10-CM

## 2021-04-16 DIAGNOSIS — R7989 Other specified abnormal findings of blood chemistry: Secondary | ICD-10-CM

## 2021-04-16 DIAGNOSIS — K760 Fatty (change of) liver, not elsewhere classified: Secondary | ICD-10-CM | POA: Diagnosis not present

## 2021-04-17 LAB — TEST AUTHORIZATION

## 2021-04-17 LAB — HEPATIC FUNCTION PANEL
AG Ratio: 2.3 (calc) (ref 1.0–2.5)
ALT: 100 U/L — ABNORMAL HIGH (ref 9–46)
AST: 55 U/L — ABNORMAL HIGH (ref 10–35)
Albumin: 4.5 g/dL (ref 3.6–5.1)
Alkaline phosphatase (APISO): 55 U/L (ref 35–144)
Bilirubin, Direct: 0.1 mg/dL (ref 0.0–0.2)
Globulin: 2 g/dL (calc) (ref 1.9–3.7)
Indirect Bilirubin: 0.3 mg/dL (calc) (ref 0.2–1.2)
Total Bilirubin: 0.4 mg/dL (ref 0.2–1.2)
Total Protein: 6.5 g/dL (ref 6.1–8.1)

## 2021-04-17 LAB — HEPATITIS C ANTIBODY
Hepatitis C Ab: NONREACTIVE
SIGNAL TO CUT-OFF: 0.01 (ref <1.00)

## 2021-04-26 ENCOUNTER — Other Ambulatory Visit: Payer: Self-pay | Admitting: Internal Medicine

## 2021-04-27 ENCOUNTER — Other Ambulatory Visit: Payer: Self-pay | Admitting: Adult Health

## 2021-04-27 DIAGNOSIS — R7989 Other specified abnormal findings of blood chemistry: Secondary | ICD-10-CM

## 2021-05-28 ENCOUNTER — Encounter: Payer: Medicare Other | Admitting: Adult Health
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
## 2021-06-05 ENCOUNTER — Other Ambulatory Visit: Payer: Self-pay

## 2021-06-05 ENCOUNTER — Encounter: Payer: Self-pay | Admitting: Adult Health

## 2021-06-05 ENCOUNTER — Ambulatory Visit (INDEPENDENT_AMBULATORY_CARE_PROVIDER_SITE_OTHER): Payer: Medicare Other | Admitting: Adult Health

## 2021-06-05 VITALS — BP 136/82 | HR 58 | Temp 97.3°F | Ht 70.5 in | Wt 247.2 lb

## 2021-06-05 DIAGNOSIS — K219 Gastro-esophageal reflux disease without esophagitis: Secondary | ICD-10-CM

## 2021-06-05 DIAGNOSIS — E559 Vitamin D deficiency, unspecified: Secondary | ICD-10-CM | POA: Diagnosis not present

## 2021-06-05 DIAGNOSIS — Z125 Encounter for screening for malignant neoplasm of prostate: Secondary | ICD-10-CM

## 2021-06-05 DIAGNOSIS — R7309 Other abnormal glucose: Secondary | ICD-10-CM

## 2021-06-05 DIAGNOSIS — J449 Chronic obstructive pulmonary disease, unspecified: Secondary | ICD-10-CM

## 2021-06-05 DIAGNOSIS — Z23 Encounter for immunization: Secondary | ICD-10-CM

## 2021-06-05 DIAGNOSIS — Z860101 Personal history of adenomatous and serrated colon polyps: Secondary | ICD-10-CM

## 2021-06-05 DIAGNOSIS — T8484XA Pain due to internal orthopedic prosthetic devices, implants and grafts, initial encounter: Secondary | ICD-10-CM

## 2021-06-05 DIAGNOSIS — Z136 Encounter for screening for cardiovascular disorders: Secondary | ICD-10-CM

## 2021-06-05 DIAGNOSIS — I700 Atherosclerosis of aorta: Secondary | ICD-10-CM

## 2021-06-05 DIAGNOSIS — E538 Deficiency of other specified B group vitamins: Secondary | ICD-10-CM

## 2021-06-05 DIAGNOSIS — Z85828 Personal history of other malignant neoplasm of skin: Secondary | ICD-10-CM

## 2021-06-05 DIAGNOSIS — Z1389 Encounter for screening for other disorder: Secondary | ICD-10-CM

## 2021-06-05 DIAGNOSIS — E782 Mixed hyperlipidemia: Secondary | ICD-10-CM

## 2021-06-05 DIAGNOSIS — Z0001 Encounter for general adult medical examination with abnormal findings: Secondary | ICD-10-CM

## 2021-06-05 DIAGNOSIS — Z89512 Acquired absence of left leg below knee: Secondary | ICD-10-CM

## 2021-06-05 DIAGNOSIS — I10 Essential (primary) hypertension: Secondary | ICD-10-CM

## 2021-06-05 DIAGNOSIS — Z0000 Encounter for general adult medical examination without abnormal findings: Secondary | ICD-10-CM | POA: Diagnosis not present

## 2021-06-05 DIAGNOSIS — Z79899 Other long term (current) drug therapy: Secondary | ICD-10-CM | POA: Diagnosis not present

## 2021-06-05 DIAGNOSIS — Z1329 Encounter for screening for other suspected endocrine disorder: Secondary | ICD-10-CM

## 2021-06-05 DIAGNOSIS — Z87891 Personal history of nicotine dependence: Secondary | ICD-10-CM

## 2021-06-05 DIAGNOSIS — R7989 Other specified abnormal findings of blood chemistry: Secondary | ICD-10-CM

## 2021-06-05 DIAGNOSIS — F1021 Alcohol dependence, in remission: Secondary | ICD-10-CM

## 2021-06-05 DIAGNOSIS — G546 Phantom limb syndrome with pain: Secondary | ICD-10-CM

## 2021-06-05 DIAGNOSIS — Z86010 Personal history of colonic polyps: Secondary | ICD-10-CM

## 2021-06-05 DIAGNOSIS — F325 Major depressive disorder, single episode, in full remission: Secondary | ICD-10-CM

## 2021-06-05 NOTE — Patient Instructions (Addendum)
Mr. Thornton , Thank you for taking time to come for your Annual Wellness Visit. I appreciate your ongoing commitment to your health goals. Please review the following plan we discussed and let me know if I can assist you in the future.   These are the goals we discussed:  Goals      DIET - EAT MORE FRUITS AND VEGETABLES     Also beans, whole grains     DIET - REDUCE SUGAR INTAKE     LDL CALC < 100     Weight (lb) < 215 lb (97.5 kg)        This is a list of the screening recommended for you and due dates:  Health Maintenance  Topic Date Due   Zoster (Shingles) Vaccine (1 of 2) Never done   COVID-19 Vaccine (4 - Booster) 07/05/2020   Flu Shot  02/05/2021   Pneumococcal Vaccination (2 - PCV) 08/29/2021*   Colon Cancer Screening  11/03/2023   Tetanus Vaccine  04/05/2026   Hepatitis C Screening: USPSTF Recommendation to screen - Ages 18-79 yo.  Completed   HPV Vaccine  Aged Out   HIV Screening  Discontinued  *Topic was postponed. The date shown is not the original due date.     Can check with insurance if shingrix is covered and get at [facility] or [facility] - 2 shots 2-6 months apart  Please send covid booster infomation    Zoster Vaccine, Recombinant injection What is this medication? ZOSTER VACCINE (ZOS ter vak SEEN) is a vaccine used to reduce the risk of getting shingles. This vaccine is not used to treat shingles or nerve pain from shingles. This medicine may be used for other purposes; ask your health care provider or pharmacist if you have questions. COMMON BRAND NAME(S): [facility] What should I tell my care team before I take this medication? They need to know if you have any of these conditions: cancer immune system problems an unusual or allergic reaction to Zoster vaccine, other medications, foods, dyes, or preservatives pregnant or trying to get pregnant breast-feeding How should I use this medication? This vaccine is injected into a muscle. It is given by a  health care provider. A copy of Vaccine Information Statements will be given before each vaccination. Be sure to read this information carefully each time. This sheet may change often. Talk to your health care provider about the use of this vaccine in children. This vaccine is not approved for use in children. Overdosage: If you think you have taken too much of this medicine contact a poison control center or emergency room at once. NOTE: This medicine is only for you. Do not share this medicine with others. What if I miss a dose? Keep appointments for follow-up (booster) doses. It is important not to miss your dose. Call your health care provider if you are unable to keep an appointment. What may interact with this medication? medicines that suppress your immune system medicines to treat cancer steroid medicines like prednisone or cortisone This list may not describe all possible interactions. Give your health care provider a list of all the medicines, herbs, non-prescription drugs, or dietary supplements you use. Also tell them if you smoke, drink alcohol, or use illegal drugs. Some items may interact with your medicine. What should I watch for while using this medication? Visit your health care provider regularly. This vaccine, like all vaccines, may not fully protect everyone. What side effects may I notice from receiving this medication? Side  effects that you should report to your doctor or health care professional as soon as possible: allergic reactions (skin rash, itching or hives; swelling of the face, lips, or tongue) trouble breathing Side effects that usually do not require medical attention (report these to your doctor or health care professional if they continue or are bothersome): chills headache fever nausea pain, redness, or irritation at site where injected tiredness vomiting This list may not describe all possible side effects. Call your doctor for medical advice about  side effects. You may report side effects to FDA at 1-800-FDA-1088. Where should I keep my medication? This vaccine is only given by a health care provider. It will not be stored at home. NOTE: This sheet is a summary. It may not cover all possible information. If you have questions about this medicine, talk to your doctor, pharmacist, or health care provider.  2022 Elsevier/Gold Standard (2021-03-13 00:00:00)

## 2021-06-06 LAB — CBC WITH DIFFERENTIAL/PLATELET
Absolute Monocytes: 653 cells/uL (ref 200–950)
Basophils Absolute: 64 cells/uL (ref 0–200)
Basophils Relative: 0.7 %
Eosinophils Absolute: 258 cells/uL (ref 15–500)
Eosinophils Relative: 2.8 %
HCT: 48.1 % (ref 38.5–50.0)
Hemoglobin: 16 g/dL (ref 13.2–17.1)
Lymphs Abs: 2153 cells/uL (ref 850–3900)
MCH: 29.2 pg (ref 27.0–33.0)
MCHC: 33.3 g/dL (ref 32.0–36.0)
MCV: 87.8 fL (ref 80.0–100.0)
MPV: 12.4 fL (ref 7.5–12.5)
Monocytes Relative: 7.1 %
Neutro Abs: 6072 cells/uL (ref 1500–7800)
Neutrophils Relative %: 66 %
Platelets: 180 10*3/uL (ref 140–400)
RBC: 5.48 10*6/uL (ref 4.20–5.80)
RDW: 12.9 % (ref 11.0–15.0)
Total Lymphocyte: 23.4 %
WBC: 9.2 10*3/uL (ref 3.8–10.8)

## 2021-06-06 LAB — LIPID PANEL
Cholesterol: 171 mg/dL (ref <200)
HDL: 49 mg/dL (ref >=40)
LDL Cholesterol (Calc): 97 mg/dL (calc)
Non-HDL Cholesterol (Calc): 122 mg/dL (calc) (ref <130)
Total CHOL/HDL Ratio: 3.5 (calc) (ref <5.0)
Triglycerides: 152 mg/dL — ABNORMAL HIGH (ref <150)

## 2021-06-06 LAB — COMPLETE METABOLIC PANEL WITH GFR
AG Ratio: 2 (calc) (ref 1.0–2.5)
ALT: 61 U/L — ABNORMAL HIGH (ref 9–46)
AST: 40 U/L — ABNORMAL HIGH (ref 10–35)
Albumin: 4.6 g/dL (ref 3.6–5.1)
Alkaline phosphatase (APISO): 57 U/L (ref 35–144)
BUN/Creatinine Ratio: 34 (calc) — ABNORMAL HIGH (ref 6–22)
BUN: 22 mg/dL (ref 7–25)
CO2: 26 mmol/L (ref 20–32)
Calcium: 9.8 mg/dL (ref 8.6–10.3)
Chloride: 104 mmol/L (ref 98–110)
Creat: 0.65 mg/dL — ABNORMAL LOW (ref 0.70–1.35)
Globulin: 2.3 g/dL (calc) (ref 1.9–3.7)
Glucose, Bld: 103 mg/dL — ABNORMAL HIGH (ref 65–99)
Potassium: 4.6 mmol/L (ref 3.5–5.3)
Sodium: 140 mmol/L (ref 135–146)
Total Bilirubin: 0.6 mg/dL (ref 0.2–1.2)
Total Protein: 6.9 g/dL (ref 6.1–8.1)
eGFR: 107 mL/min/{1.73_m2} (ref >=60)

## 2021-06-06 LAB — MICROALBUMIN / CREATININE URINE RATIO
Creatinine, Urine: 112 mg/dL (ref 20–320)
Microalb Creat Ratio: 7 mcg/mg creat (ref <30)
Microalb, Ur: 0.8 mg/dL

## 2021-06-06 LAB — HEMOGLOBIN A1C
Hgb A1c MFr Bld: 6.3 % of total Hgb — ABNORMAL HIGH (ref <5.7)
Mean Plasma Glucose: 134 mg/dL
eAG (mmol/L): 7.4 mmol/L

## 2021-06-06 LAB — URINALYSIS, ROUTINE W REFLEX MICROSCOPIC
Bilirubin Urine: NEGATIVE
Glucose, UA: NEGATIVE
Hgb urine dipstick: NEGATIVE
Ketones, ur: NEGATIVE
Leukocytes,Ua: NEGATIVE
Nitrite: NEGATIVE
Protein, ur: NEGATIVE
Specific Gravity, Urine: 1.025 (ref 1.001–1.035)
pH: 6 (ref 5.0–8.0)

## 2021-06-06 LAB — VITAMIN B12: Vitamin B-12: 566 pg/mL (ref 200–1100)

## 2021-06-06 LAB — PSA: PSA: 2.58 ng/mL (ref <=4.00)

## 2021-06-06 LAB — TSH: TSH: 1.48 mIU/L (ref 0.40–4.50)

## 2021-06-06 LAB — VITAMIN D 25 HYDROXY (VIT D DEFICIENCY, FRACTURES): Vit D, 25-Hydroxy: 52 ng/mL (ref 30–100)

## 2021-06-06 LAB — MAGNESIUM: Magnesium: 2.3 mg/dL (ref 1.5–2.5)

## 2021-06-16 ENCOUNTER — Other Ambulatory Visit: Payer: Self-pay | Admitting: Adult Health Nurse Practitioner

## 2021-06-16 ENCOUNTER — Other Ambulatory Visit: Payer: Self-pay | Admitting: Internal Medicine

## 2021-06-16 DIAGNOSIS — S069X3S Unspecified intracranial injury with loss of consciousness of 1 hour to 5 hours 59 minutes, sequela: Secondary | ICD-10-CM

## 2021-06-16 DIAGNOSIS — I10 Essential (primary) hypertension: Secondary | ICD-10-CM

## 2021-06-16 DIAGNOSIS — G546 Phantom limb syndrome with pain: Secondary | ICD-10-CM

## 2021-06-16 DIAGNOSIS — S143XXS Injury of brachial plexus, sequela: Secondary | ICD-10-CM

## 2021-06-16 DIAGNOSIS — Z89512 Acquired absence of left leg below knee: Secondary | ICD-10-CM

## 2021-08-09 ENCOUNTER — Ambulatory Visit (INDEPENDENT_AMBULATORY_CARE_PROVIDER_SITE_OTHER): Payer: Medicare Other | Admitting: Nurse Practitioner

## 2021-08-09 ENCOUNTER — Other Ambulatory Visit: Payer: Self-pay

## 2021-08-09 ENCOUNTER — Encounter: Payer: Self-pay | Admitting: Nurse Practitioner

## 2021-08-09 VITALS — BP 140/84 | HR 61 | Temp 97.2°F | Wt 248.2 lb

## 2021-08-09 DIAGNOSIS — Z1152 Encounter for screening for COVID-19: Secondary | ICD-10-CM | POA: Diagnosis not present

## 2021-08-09 DIAGNOSIS — J069 Acute upper respiratory infection, unspecified: Secondary | ICD-10-CM | POA: Diagnosis not present

## 2021-08-09 LAB — POC COVID19 BINAXNOW: SARS Coronavirus 2 Ag: NEGATIVE

## 2021-08-09 MED ORDER — DEXAMETHASONE 1 MG PO TABS: ORAL_TABLET | ORAL | 0 refills | Status: DC

## 2021-08-09 MED ORDER — PROMETHAZINE-DM 6.25-15 MG/5ML PO SYRP
5.0000 mL | ORAL_SOLUTION | Freq: Four times a day (QID) | ORAL | 1 refills | Status: DC | PRN
Start: 2021-08-09 — End: 2021-10-04

## 2021-08-09 MED ORDER — BENZONATATE 100 MG PO CAPS: 100.0000 mg | ORAL_CAPSULE | Freq: Four times a day (QID) | ORAL | 1 refills | Status: AC | PRN

## 2021-08-09 NOTE — Patient Instructions (Signed)
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
## 2021-08-12 ENCOUNTER — Other Ambulatory Visit: Payer: Self-pay | Admitting: Adult Health

## 2021-08-17 ENCOUNTER — Other Ambulatory Visit: Payer: Self-pay | Admitting: Nurse Practitioner

## 2021-09-08 ENCOUNTER — Other Ambulatory Visit: Payer: Self-pay | Admitting: Nurse Practitioner

## 2021-09-08 ENCOUNTER — Other Ambulatory Visit: Payer: Self-pay | Admitting: Adult Health

## 2021-09-08 DIAGNOSIS — I10 Essential (primary) hypertension: Secondary | ICD-10-CM

## 2021-09-08 DIAGNOSIS — S069X3S Unspecified intracranial injury with loss of consciousness of 1 hour to 5 hours 59 minutes, sequela: Secondary | ICD-10-CM

## 2021-09-08 DIAGNOSIS — Z89512 Acquired absence of left leg below knee: Secondary | ICD-10-CM

## 2021-09-08 DIAGNOSIS — E782 Mixed hyperlipidemia: Secondary | ICD-10-CM

## 2021-09-08 DIAGNOSIS — G546 Phantom limb syndrome with pain: Secondary | ICD-10-CM

## 2021-09-08 DIAGNOSIS — S143XXS Injury of brachial plexus, sequela: Secondary | ICD-10-CM

## 2021-09-22 ENCOUNTER — Other Ambulatory Visit: Payer: Self-pay | Admitting: Adult Health
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
## 2021-10-04 ENCOUNTER — Ambulatory Visit (INDEPENDENT_AMBULATORY_CARE_PROVIDER_SITE_OTHER): Payer: Medicare Other | Admitting: Internal Medicine

## 2021-10-04 ENCOUNTER — Encounter: Payer: Self-pay | Admitting: Internal Medicine

## 2021-10-04 VITALS — BP 128/82 | HR 70 | Temp 97.9°F | Resp 16 | Ht 70.5 in | Wt 253.0 lb

## 2021-10-04 DIAGNOSIS — R7309 Other abnormal glucose: Secondary | ICD-10-CM | POA: Diagnosis not present

## 2021-10-04 DIAGNOSIS — E559 Vitamin D deficiency, unspecified: Secondary | ICD-10-CM

## 2021-10-04 DIAGNOSIS — E782 Mixed hyperlipidemia: Secondary | ICD-10-CM

## 2021-10-04 DIAGNOSIS — Z79899 Other long term (current) drug therapy: Secondary | ICD-10-CM

## 2021-10-04 DIAGNOSIS — I10 Essential (primary) hypertension: Secondary | ICD-10-CM | POA: Diagnosis not present

## 2021-10-04 DIAGNOSIS — I700 Atherosclerosis of aorta: Secondary | ICD-10-CM

## 2021-10-04 DIAGNOSIS — Z89512 Acquired absence of left leg below knee: Secondary | ICD-10-CM

## 2021-10-04 DIAGNOSIS — Z87820 Personal history of traumatic brain injury: Secondary | ICD-10-CM

## 2021-10-04 NOTE — Patient Instructions (Signed)
Due to recent changes in healthcare laws, you may see the results of your imaging and laboratory studies on MyChart before your provider has had a chance to review them.  We understand that in some cases there may be results that are confusing or concerning to you. Not all laboratory results come back in the same time frame and the provider may be waiting for multiple results in order to interpret others.  Please give us 48 hours in order for your provider to thoroughly review all the results before contacting the office for clarification of your results.  ?++++++++++++++++++++++++++++++++++ ? Vit D  & ?Vit C 1,000 mg   ?are recommended to help protect  ?against the Covid-19 and other Corona viruses.  ? ? Also it's recommended  ?to take  ?Zinc 50 mg  ?to help  ?protect against the Covid-19   ?and best place to get ? is also on [employer].com  ?and don't pay more than 6-8 cents /pill !  ? ?===================================== ?Coronavirus (COVID-19) Are you at risk? ? ?Are you at risk for the Coronavirus (COVID-19)? ? ?To be considered HIGH RISK for Coronavirus (COVID-19), you have to meet the following criteria: ? ?Traveled to [place], [place], [place], [place] or [place]; or in the [place] to [place], [place], [place]  ?or [place]; and have fever, cough, and shortness of breath within the last 2 weeks of travel OR ?Been in close contact with a person diagnosed with COVID-19 within the last 2 weeks and have  ?fever, cough,and shortness of breath ? ?IF YOU DO NOT MEET THESE CRITERIA, YOU ARE CONSIDERED LOW RISK FOR COVID-19. ? ?What to do if you are HIGH RISK for COVID-19? ? ?If you are having a medical emergency, call 911. ?Seek medical care right away. Before you go to a doctor?s office, urgent care or emergency department, ? call ahead and tell them about your recent travel, contact with someone diagnosed with COVID-19  ? and your symptoms.  ?You should receive instructions from your physician?s office  regarding next steps of care.  ?When you arrive at healthcare provider, tell the healthcare staff immediately you have returned from  ?visiting [place], [place], [place], [place] or [place]; or traveled in the [place] to [place], [place],  ?[place] or [place] in the last two weeks or you have been in close contact with a person diagnosed with  ?COVID-19 in the last 2 weeks.   ?Tell the health care staff about your symptoms: fever, cough and shortness of breath. ?After you have been seen by a medical provider, you will be either: ?Tested for (COVID-19) and discharged home on quarantine except to seek medical care if  ?symptoms worsen, and asked to  ?Stay home and avoid contact with others until you get your results (4-5 days)  ?Avoid travel on public transportation if possible (such as bus, train, or airplane) or ?Sent to the Emergency Department by EMS for evaluation, COVID-19 testing  and  ?possible admission depending on your condition and test results. ? ?What to do if you are LOW RISK for COVID-19? ? ?Reduce your risk of any infection by using the same precautions used for avoiding the common cold or flu:  ?Wash your hands often with soap and warm water for at least 20 seconds.  If soap and water are not readily available,  ?use an alcohol-based hand sanitizer with at least 60% alcohol.  ?If coughing or sneezing, cover your mouth and nose by coughing   or sneezing into the elbow areas of your shirt or coat, ? into a tissue or into your sleeve (not your hands). ?Avoid shaking hands with others and consider head nods or verbal greetings only. ?Avoid touching your eyes, nose, or mouth with unwashed hands.  ?Avoid close contact with people who are sick. ?Avoid places or events with large numbers of people in one location, like concerts or sporting events. ?Carefully consider travel plans you have or are making. ?If you are planning any travel outside or inside the [place], visit the CDC?s Travelers? Health  webpage for the latest health notices. ?If you have some symptoms but not all symptoms, continue to monitor at home and seek medical attention  ?if your symptoms worsen. ?If you are having a medical emergency, call 911. ? ? ?++++++++++++++++++++++++++++++++ ?Recommend Adult Low Dose Aspirin or  ?coated  Aspirin 81 mg daily  ?To reduce risk of Colon Cancer 40 %, ? Skin Cancer 26 % ,  ?Melanoma 46% ? and  ?Pancreatic cancer 60% ?++++++++++++++++++++++++++++++++ ?Vitamin D goal ? is between 70-100.  ?Please make sure that you are taking your Vitamin D as directed.  ?It is very important as a natural anti-inflammatory  ?helping hair, skin, and nails, as well as reducing stroke and heart attack risk.  ?It helps your bones and helps with mood. ?It also decreases numerous cancer risks so please take it as directed.  ?Low Vit D is associated with a 200-300% higher risk for CANCER  ?and 200-300% higher risk for HEART   ATTACK  &  STROKE.   ?...................................... ?It is also associated with higher death rate at younger ages,  ?autoimmune diseases like Rheumatoid arthritis, Lupus, Multiple Sclerosis.    ?Also many other serious conditions, like depression, Alzheimer's ?Dementia, infertility, muscle aches, fatigue, fibromyalgia - just to name a few. ?++++++++++++++++++++ ?Recommend the book "The END of DIETING" by Dr Joel Fuhrman  ?& the book "The END of DIABETES " by Dr Joel Fuhrman ?At [employer].com - get book & Audio CD's  ?  Being diabetic has a  300% increased risk for heart attack, stroke, cancer, and alzheimer- type vascular dementia. It is very important that you work harder with diet by avoiding all foods that are white. Avoid white rice (brown & wild rice is OK), white potatoes (sweetpotatoes in moderation is OK), White bread or wheat bread or anything made out of white flour like bagels, donuts, rolls, buns, biscuits, cakes, pastries, cookies, pizza crust, and pasta (made from white flour & egg whites)  - vegetarian pasta or spinach or wheat pasta is OK. Multigrain breads like Arnold's or Pepperidge [employer], or multigrain sandwich thins or flatbreads.  Diet, exercise and weight loss can reverse and cure diabetes in the early stages.  Diet, exercise and weight loss is very important in the control and prevention of complications of diabetes which affects every system in your body, ie. Brain - dementia/stroke, eyes - glaucoma/blindness, heart - heart attack/heart failure, kidneys - dialysis, stomach - gastric paralysis, intestines - malabsorption, nerves - severe painful neuritis, circulation - gangrene & loss of a leg(s), and finally cancer and Alzheimers. ? ?  I recommend avoid fried & greasy foods,  sweets/candy, white rice (brown or wild rice or Quinoa is OK), white potatoes (sweet potatoes are OK) - anything made from white flour - bagels, doughnuts, rolls, buns, biscuits,white and wheat breads, pizza crust and traditional pasta made of white flour & egg white(vegetarian pasta or spinach or wheat pasta is OK).    Multi-grain bread is OK - like multi-grain flat bread or sandwich thins. Avoid alcohol in excess. Exercise is also important. ? ?  Eat all the vegetables you want - avoid meat, especially red meat and dairy - especially cheese.  Cheese is the most concentrated form of trans-fats which is the worst thing to clog up our arteries. Veggie cheese is OK which can be found in the fresh produce section at [employer] or [employer] or [employer] ? ?+++++++++++++++++++++ ?DASH Eating Plan ? ?DASH stands for "Dietary Approaches to Stop Hypertension."  ? ?The DASH eating plan is a healthy eating plan that has been shown to reduce high blood pressure (hypertension). Additional health benefits may include reducing the risk of type 2 diabetes mellitus, heart disease, and stroke. The DASH eating plan may also help with weight loss. ?WHAT DO I NEED TO KNOW ABOUT THE DASH EATING PLAN? ?For the DASH eating plan, you will  follow these general guidelines: ?Choose foods with a percent daily value for sodium of less than 5% (as listed on the food label). ?Use salt-free seasonings or herbs instead of table salt or sea salt. ?Check

## 2021-10-05 LAB — CBC WITH DIFFERENTIAL/PLATELET
Absolute Monocytes: 672 cells/uL (ref 200–950)
Basophils Absolute: 43 cells/uL (ref 0–200)
Basophils Relative: 0.5 %
Eosinophils Absolute: 221 cells/uL (ref 15–500)
Eosinophils Relative: 2.6 %
HCT: 46.4 % (ref 38.5–50.0)
Hemoglobin: 15.3 g/dL (ref 13.2–17.1)
Lymphs Abs: 2015 cells/uL (ref 850–3900)
MCH: 28.6 pg (ref 27.0–33.0)
MCHC: 33 g/dL (ref 32.0–36.0)
MCV: 86.7 fL (ref 80.0–100.0)
MPV: 12.2 fL (ref 7.5–12.5)
Monocytes Relative: 7.9 %
Neutro Abs: 5551 cells/uL (ref 1500–7800)
Neutrophils Relative %: 65.3 %
Platelets: 167 10*3/uL (ref 140–400)
RBC: 5.35 10*6/uL (ref 4.20–5.80)
RDW: 14 % (ref 11.0–15.0)
Total Lymphocyte: 23.7 %
WBC: 8.5 10*3/uL (ref 3.8–10.8)

## 2021-10-05 LAB — LIPID PANEL
Cholesterol: 160 mg/dL (ref <200)
HDL: 40 mg/dL (ref >=40)
LDL Cholesterol (Calc): 93 mg/dL (calc)
Non-HDL Cholesterol (Calc): 120 mg/dL (calc) (ref <130)
Total CHOL/HDL Ratio: 4 (calc) (ref <5.0)
Triglycerides: 168 mg/dL — ABNORMAL HIGH (ref <150)

## 2021-10-05 LAB — COMPLETE METABOLIC PANEL WITH GFR
AG Ratio: 1.8 (calc) (ref 1.0–2.5)
ALT: 82 U/L — ABNORMAL HIGH (ref 9–46)
AST: 54 U/L — ABNORMAL HIGH (ref 10–35)
Albumin: 4.4 g/dL (ref 3.6–5.1)
Alkaline phosphatase (APISO): 52 U/L (ref 35–144)
BUN: 23 mg/dL (ref 7–25)
CO2: 26 mmol/L (ref 20–32)
Calcium: 9.6 mg/dL (ref 8.6–10.3)
Chloride: 104 mmol/L (ref 98–110)
Creat: 0.77 mg/dL (ref 0.70–1.35)
Globulin: 2.4 g/dL (calc) (ref 1.9–3.7)
Glucose, Bld: 102 mg/dL — ABNORMAL HIGH (ref 65–99)
Potassium: 4.6 mmol/L (ref 3.5–5.3)
Sodium: 140 mmol/L (ref 135–146)
Total Bilirubin: 0.5 mg/dL (ref 0.2–1.2)
Total Protein: 6.8 g/dL (ref 6.1–8.1)
eGFR: 101 mL/min/{1.73_m2} (ref >=60)

## 2021-10-05 LAB — HEMOGLOBIN A1C
Hgb A1c MFr Bld: 6.8 % of total Hgb — ABNORMAL HIGH (ref <5.7)
Mean Plasma Glucose: 148 mg/dL
eAG (mmol/L): 8.2 mmol/L

## 2021-10-05 LAB — VITAMIN D 25 HYDROXY (VIT D DEFICIENCY, FRACTURES): Vit D, 25-Hydroxy: 57 ng/mL (ref 30–100)

## 2021-10-05 LAB — TSH: TSH: 1.74 mIU/L (ref 0.40–4.50)

## 2021-10-05 LAB — INSULIN, RANDOM: Insulin: 18.3 u[IU]/mL

## 2021-10-05 LAB — MAGNESIUM: Magnesium: 2 mg/dL (ref 1.5–2.5)

## 2021-10-05 NOTE — Progress Notes (Signed)
<><><><><><><><><><><><><><><><><><><><><><><><><><><><><><><><><> ?<><><><><><><><><><><><><><><><><><><><><><><><><><><><><><><><><> ?-   Test results slightly outside the reference range are not unusual. ?If there is anything important, I will review this with you,  ?otherwise it is considered normal test values.  ?If you have further questions,  ?please do not hesitate to contact me at the office or via My Chart.  ?<><><><><><><><><><><><><><><><><><><><><><><><><><><><><><><><><> ?<><><><><><><><><><><><><><><><><><><><><><><><><><><><><><><><><> ? ?- Liver enzymes  (AST & ALT ) Still slightly elevated  &  Stable - will continue to monitor ? ?<><><><><><><><><><><><><><><><><><><><><><><><><><><><><><><><><> ?<><><><><><><><><><><><><><><><><><><><><><><><><><><><><><><><><> ? ?-  Total Chol = 160   &    LDL Chol = 93   -  Both  Excellent  ? ?- Very low risk for Heart Attack  / Stroke ?<><><><><><><><><><><><><><><><><><><><><><><><><><><><><><><><><> ?<><><><><><><><><><><><><><><><><><><><><><><><><><><><><><><><><> ? ?-   A1c  (12 week average blood sugar) = 6.8% is too high  ?                                                           - very close to need to start Diabetic Meds  ? ? When A1c - is over 7.0% - then  need to start diabetic meds ! ?<><><><><><><><><><><><><><><><><><><><><><><><><><><><><><><><><> ?<><><><><><><><><><><><><><><><><><><><><><><><><><><><><><><><><> ? ?-  Vitamin D= 57  - Great  !    - Please continue meds same  ?<><><><><><><><><><><><><><><><><><><><><><><><><><><><><><><><><> ?<><><><><><><><><><><><><><><><><><><><><><><><><><><><><><><><><> ? ?-  All Else - CBC - Kidneys - Electrolytes - Liver - Magnesium & Thyroid   ? ?- all  Normal / OK ?<><><><><><><><><><><><><><><><><><><><><><><><><><><><><><><><><> ?<><><><><><><><><><><><><><><><><><><><><><><><><><><><><><><><><> ? ? ? ? ? ? ? ? ? ? ? ? ? ? ? ? ? ?

## 2021-11-12 DIAGNOSIS — Z89512 Acquired absence of left leg below knee: Secondary | ICD-10-CM | POA: Diagnosis not present

## 2022-02-19 ENCOUNTER — Other Ambulatory Visit: Payer: Self-pay

## 2022-02-19 DIAGNOSIS — E782 Mixed hyperlipidemia: Secondary | ICD-10-CM

## 2022-02-19 MED ORDER — EZETIMIBE 10 MG PO TABS: 10.0000 mg | ORAL_TABLET | Freq: Every day | ORAL | 1 refills | Status: DC

## 2022-02-28 ENCOUNTER — Encounter: Payer: Self-pay | Admitting: Nurse Practitioner

## 2022-02-28 ENCOUNTER — Ambulatory Visit (INDEPENDENT_AMBULATORY_CARE_PROVIDER_SITE_OTHER): Payer: Medicare Other | Admitting: Nurse Practitioner

## 2022-02-28 VITALS — BP 140/80 | HR 61 | Temp 97.3°F | Ht 70.5 in | Wt 252.0 lb

## 2022-02-28 DIAGNOSIS — J449 Chronic obstructive pulmonary disease, unspecified: Secondary | ICD-10-CM

## 2022-02-28 DIAGNOSIS — I700 Atherosclerosis of aorta: Secondary | ICD-10-CM | POA: Diagnosis not present

## 2022-02-28 DIAGNOSIS — R6889 Other general symptoms and signs: Secondary | ICD-10-CM | POA: Diagnosis not present

## 2022-02-28 DIAGNOSIS — E782 Mixed hyperlipidemia: Secondary | ICD-10-CM | POA: Diagnosis not present

## 2022-02-28 DIAGNOSIS — Z87820 Personal history of traumatic brain injury: Secondary | ICD-10-CM

## 2022-02-28 DIAGNOSIS — Z79899 Other long term (current) drug therapy: Secondary | ICD-10-CM

## 2022-02-28 DIAGNOSIS — Z89512 Acquired absence of left leg below knee: Secondary | ICD-10-CM

## 2022-02-28 DIAGNOSIS — G546 Phantom limb syndrome with pain: Secondary | ICD-10-CM

## 2022-02-28 DIAGNOSIS — E559 Vitamin D deficiency, unspecified: Secondary | ICD-10-CM | POA: Diagnosis not present

## 2022-02-28 DIAGNOSIS — Z0000 Encounter for general adult medical examination without abnormal findings: Secondary | ICD-10-CM

## 2022-02-28 DIAGNOSIS — K760 Fatty (change of) liver, not elsewhere classified: Secondary | ICD-10-CM

## 2022-02-28 DIAGNOSIS — R7303 Prediabetes: Secondary | ICD-10-CM

## 2022-02-28 DIAGNOSIS — Z0001 Encounter for general adult medical examination with abnormal findings: Secondary | ICD-10-CM | POA: Diagnosis not present

## 2022-02-28 DIAGNOSIS — I10 Essential (primary) hypertension: Secondary | ICD-10-CM | POA: Diagnosis not present

## 2022-02-28 DIAGNOSIS — Z87891 Personal history of nicotine dependence: Secondary | ICD-10-CM

## 2022-02-28 DIAGNOSIS — F1021 Alcohol dependence, in remission: Secondary | ICD-10-CM

## 2022-02-28 DIAGNOSIS — K219 Gastro-esophageal reflux disease without esophagitis: Secondary | ICD-10-CM

## 2022-02-28 DIAGNOSIS — N281 Cyst of kidney, acquired: Secondary | ICD-10-CM

## 2022-02-28 DIAGNOSIS — R7989 Other specified abnormal findings of blood chemistry: Secondary | ICD-10-CM

## 2022-02-28 DIAGNOSIS — Z86010 Personal history of colonic polyps: Secondary | ICD-10-CM

## 2022-02-28 DIAGNOSIS — Z85828 Personal history of other malignant neoplasm of skin: Secondary | ICD-10-CM

## 2022-02-28 DIAGNOSIS — Z860101 Personal history of adenomatous and serrated colon polyps: Secondary | ICD-10-CM
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
## 2022-02-28 NOTE — Patient Instructions (Signed)

## 2022-03-01 LAB — CBC WITH DIFFERENTIAL/PLATELET
Absolute Monocytes: 562 cells/uL (ref 200–950)
Basophils Absolute: 54 cells/uL (ref 0–200)
Basophils Relative: 0.7 %
Eosinophils Absolute: 169 cells/uL (ref 15–500)
Eosinophils Relative: 2.2 %
HCT: 45.6 % (ref 38.5–50.0)
Hemoglobin: 15.2 g/dL (ref 13.2–17.1)
Lymphs Abs: 2056 cells/uL (ref 850–3900)
MCH: 28.7 pg (ref 27.0–33.0)
MCHC: 33.3 g/dL (ref 32.0–36.0)
MCV: 86 fL (ref 80.0–100.0)
MPV: 12.1 fL (ref 7.5–12.5)
Monocytes Relative: 7.3 %
Neutro Abs: 4859 cells/uL (ref 1500–7800)
Neutrophils Relative %: 63.1 %
Platelets: 172 10*3/uL (ref 140–400)
RBC: 5.3 10*6/uL (ref 4.20–5.80)
RDW: 13.2 % (ref 11.0–15.0)
Total Lymphocyte: 26.7 %
WBC: 7.7 10*3/uL (ref 3.8–10.8)

## 2022-03-01 LAB — COMPLETE METABOLIC PANEL WITH GFR
AG Ratio: 2 (calc) (ref 1.0–2.5)
ALT: 70 U/L — ABNORMAL HIGH (ref 9–46)
AST: 48 U/L — ABNORMAL HIGH (ref 10–35)
Albumin: 4.5 g/dL (ref 3.6–5.1)
Alkaline phosphatase (APISO): 52 U/L (ref 35–144)
BUN: 17 mg/dL (ref 7–25)
CO2: 24 mmol/L (ref 20–32)
Calcium: 9.6 mg/dL (ref 8.6–10.3)
Chloride: 103 mmol/L (ref 98–110)
Creat: 0.72 mg/dL (ref 0.70–1.35)
Globulin: 2.3 g/dL (calc) (ref 1.9–3.7)
Glucose, Bld: 98 mg/dL (ref 65–99)
Potassium: 4.5 mmol/L (ref 3.5–5.3)
Sodium: 139 mmol/L (ref 135–146)
Total Bilirubin: 0.4 mg/dL (ref 0.2–1.2)
Total Protein: 6.8 g/dL (ref 6.1–8.1)
eGFR: 103 mL/min/{1.73_m2} (ref >=60)

## 2022-03-01 LAB — LIPID PANEL
Cholesterol: 169 mg/dL (ref <200)
HDL: 42 mg/dL (ref >=40)
LDL Cholesterol (Calc): 94 mg/dL (calc)
Non-HDL Cholesterol (Calc): 127 mg/dL (calc) (ref <130)
Total CHOL/HDL Ratio: 4 (calc) (ref <5.0)
Triglycerides: 240 mg/dL — ABNORMAL HIGH (ref <150)

## 2022-03-01 LAB — VITAMIN D 25 HYDROXY (VIT D DEFICIENCY, FRACTURES): Vit D, 25-Hydroxy: 63 ng/mL (ref 30–100)

## 2022-03-01 LAB — HEMOGLOBIN A1C
Hgb A1c MFr Bld: 6.6 % of total Hgb — ABNORMAL HIGH (ref <5.7)
Mean Plasma Glucose: 143 mg/dL
eAG (mmol/L): 7.9 mmol/L

## 2022-03-12 ENCOUNTER — Other Ambulatory Visit: Payer: Self-pay

## 2022-03-12 MED ORDER — ROSUVASTATIN CALCIUM 20 MG PO TABS: ORAL_TABLET | ORAL | 3 refills | Status: DC

## 2022-03-15 ENCOUNTER — Encounter: Payer: Self-pay | Admitting: Nurse Practitioner

## 2022-03-15 MED ORDER — CYCLOBENZAPRINE HCL 5 MG PO TABS: 5.0000 mg | ORAL_TABLET | Freq: Three times a day (TID) | ORAL | 0 refills | Status: DC | PRN

## 2022-03-16 ENCOUNTER — Encounter: Payer: Self-pay | Admitting: Nurse Practitioner

## 2022-03-17 MED ORDER — ESOMEPRAZOLE MAGNESIUM 40 MG PO CPDR
DELAYED_RELEASE_CAPSULE | ORAL | 0 refills | Status: DC
Start: 2022-03-17 — End: 2022-06-14

## 2022-03-21 ENCOUNTER — Encounter: Payer: Self-pay | Admitting: Nurse Practitioner

## 2022-03-21 DIAGNOSIS — J449 Chronic obstructive pulmonary disease, unspecified: Secondary | ICD-10-CM

## 2022-03-21 MED ORDER — TRELEGY ELLIPTA 100-62.5-25 MCG/ACT IN AEPB: INHALATION_SPRAY | RESPIRATORY_TRACT | 1 refills | Status: DC

## 2022-03-23 ENCOUNTER — Encounter: Payer: Self-pay | Admitting: Nurse Practitioner

## 2022-03-24 ENCOUNTER — Other Ambulatory Visit: Payer: Self-pay | Admitting: Internal Medicine

## 2022-03-24 DIAGNOSIS — G546 Phantom limb syndrome with pain: Secondary | ICD-10-CM

## 2022-03-24 DIAGNOSIS — I10 Essential (primary) hypertension: Secondary | ICD-10-CM

## 2022-03-24 DIAGNOSIS — S143XXS Injury of brachial plexus, sequela: Secondary | ICD-10-CM

## 2022-03-24 DIAGNOSIS — Z89512 Acquired absence of left leg below knee: Secondary | ICD-10-CM

## 2022-03-24 DIAGNOSIS — S069X3S Unspecified intracranial injury with loss of consciousness of 1 hour to 5 hours 59 minutes, sequela: Secondary | ICD-10-CM

## 2022-03-24 MED ORDER — METOPROLOL TARTRATE 50 MG PO TABS: ORAL_TABLET | ORAL | 3 refills | Status: DC

## 2022-04-13 ENCOUNTER — Encounter: Payer: Self-pay | Admitting: Nurse Practitioner

## 2022-04-15 ENCOUNTER — Other Ambulatory Visit: Payer: Self-pay | Admitting: Nurse Practitioner

## 2022-04-15 MED ORDER — ROSUVASTATIN CALCIUM 20 MG PO TABS
ORAL_TABLET | ORAL | 3 refills | Status: DC
Start: 2022-04-15 — End: 2023-06-27

## 2022-06-05 ENCOUNTER — Encounter: Payer: Medicare Other | Admitting: Nurse Practitioner

## 2022-06-13 ENCOUNTER — Encounter: Payer: Self-pay | Admitting: Nurse Practitioner

## 2022-06-14 ENCOUNTER — Ambulatory Visit (INDEPENDENT_AMBULATORY_CARE_PROVIDER_SITE_OTHER): Payer: Medicare Other | Admitting: Nurse Practitioner

## 2022-06-14 VITALS — BP 146/90 | HR 56 | Temp 97.3°F | Ht 71.0 in | Wt 256.8 lb

## 2022-06-14 DIAGNOSIS — E559 Vitamin D deficiency, unspecified: Secondary | ICD-10-CM

## 2022-06-14 DIAGNOSIS — Z87820 Personal history of traumatic brain injury: Secondary | ICD-10-CM

## 2022-06-14 DIAGNOSIS — J449 Chronic obstructive pulmonary disease, unspecified: Secondary | ICD-10-CM

## 2022-06-14 DIAGNOSIS — Z89512 Acquired absence of left leg below knee: Secondary | ICD-10-CM

## 2022-06-14 DIAGNOSIS — T8484XA Pain due to internal orthopedic prosthetic devices, implants and grafts, initial encounter: Secondary | ICD-10-CM

## 2022-06-14 DIAGNOSIS — Z0000 Encounter for general adult medical examination without abnormal findings: Secondary | ICD-10-CM | POA: Diagnosis not present

## 2022-06-14 DIAGNOSIS — E782 Mixed hyperlipidemia: Secondary | ICD-10-CM

## 2022-06-14 DIAGNOSIS — I10 Essential (primary) hypertension: Secondary | ICD-10-CM

## 2022-06-14 DIAGNOSIS — Z23 Encounter for immunization: Secondary | ICD-10-CM

## 2022-06-14 DIAGNOSIS — Z1389 Encounter for screening for other disorder: Secondary | ICD-10-CM

## 2022-06-14 DIAGNOSIS — N281 Cyst of kidney, acquired: Secondary | ICD-10-CM

## 2022-06-14 DIAGNOSIS — Z87891 Personal history of nicotine dependence: Secondary | ICD-10-CM

## 2022-06-14 DIAGNOSIS — Z86010 Personal history of colonic polyps: Secondary | ICD-10-CM

## 2022-06-14 DIAGNOSIS — Z136 Encounter for screening for cardiovascular disorders: Secondary | ICD-10-CM

## 2022-06-14 DIAGNOSIS — I700 Atherosclerosis of aorta: Secondary | ICD-10-CM

## 2022-06-14 DIAGNOSIS — F1021 Alcohol dependence, in remission: Secondary | ICD-10-CM

## 2022-06-14 DIAGNOSIS — Z125 Encounter for screening for malignant neoplasm of prostate: Secondary | ICD-10-CM

## 2022-06-14 DIAGNOSIS — R7303 Prediabetes: Secondary | ICD-10-CM

## 2022-06-14 DIAGNOSIS — Z79899 Other long term (current) drug therapy: Secondary | ICD-10-CM

## 2022-06-14 DIAGNOSIS — Z0001 Encounter for general adult medical examination with abnormal findings: Secondary | ICD-10-CM

## 2022-06-14 DIAGNOSIS — K219 Gastro-esophageal reflux disease without esophagitis: Secondary | ICD-10-CM

## 2022-06-14 MED ORDER — ESOMEPRAZOLE MAGNESIUM 40 MG PO CPDR: DELAYED_RELEASE_CAPSULE | ORAL | 0 refills | Status: DC

## 2022-06-14 MED ORDER — CYCLOBENZAPRINE HCL 5 MG PO TABS: 5.0000 mg | ORAL_TABLET | Freq: Three times a day (TID) | ORAL | 0 refills | Status: DC | PRN

## 2022-06-14 MED ORDER — ESOMEPRAZOLE MAGNESIUM 40 MG PO CPDR
DELAYED_RELEASE_CAPSULE | ORAL | 0 refills | Status: DC
Start: 2022-06-14 — End: 2022-06-14
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
## 2022-06-15 LAB — LIPID PANEL
Cholesterol: 176 mg/dL (ref <200)
HDL: 50 mg/dL (ref >=40)
LDL Cholesterol (Calc): 97 mg/dL (calc)
Non-HDL Cholesterol (Calc): 126 mg/dL (calc) (ref <130)
Total CHOL/HDL Ratio: 3.5 (calc) (ref <5.0)
Triglycerides: 191 mg/dL — ABNORMAL HIGH (ref <150)

## 2022-06-15 LAB — URINALYSIS, ROUTINE W REFLEX MICROSCOPIC
Bilirubin Urine: NEGATIVE
Glucose, UA: NEGATIVE
Hgb urine dipstick: NEGATIVE
Ketones, ur: NEGATIVE
Leukocytes,Ua: NEGATIVE
Nitrite: NEGATIVE
Protein, ur: NEGATIVE
Specific Gravity, Urine: 1.021 (ref 1.001–1.035)
pH: 5.5 (ref 5.0–8.0)

## 2022-06-15 LAB — CBC WITH DIFFERENTIAL/PLATELET
Absolute Monocytes: 590 cells/uL (ref 200–950)
Basophils Absolute: 57 cells/uL (ref 0–200)
Basophils Relative: 0.7 %
Eosinophils Absolute: 189 cells/uL (ref 15–500)
Eosinophils Relative: 2.3 %
HCT: 45.6 % (ref 38.5–50.0)
Hemoglobin: 15.5 g/dL (ref 13.2–17.1)
Lymphs Abs: 2116 cells/uL (ref 850–3900)
MCH: 29.2 pg (ref 27.0–33.0)
MCHC: 34 g/dL (ref 32.0–36.0)
MCV: 85.9 fL (ref 80.0–100.0)
MPV: 12.3 fL (ref 7.5–12.5)
Monocytes Relative: 7.2 %
Neutro Abs: 5248 cells/uL (ref 1500–7800)
Neutrophils Relative %: 64 %
Platelets: 174 10*3/uL (ref 140–400)
RBC: 5.31 10*6/uL (ref 4.20–5.80)
RDW: 13.3 % (ref 11.0–15.0)
Total Lymphocyte: 25.8 %
WBC: 8.2 10*3/uL (ref 3.8–10.8)

## 2022-06-15 LAB — COMPLETE METABOLIC PANEL WITH GFR
AG Ratio: 1.8 (calc) (ref 1.0–2.5)
ALT: 99 U/L — ABNORMAL HIGH (ref 9–46)
AST: 67 U/L — ABNORMAL HIGH (ref 10–35)
Albumin: 4.6 g/dL (ref 3.6–5.1)
Alkaline phosphatase (APISO): 57 U/L (ref 35–144)
BUN/Creatinine Ratio: 27 (calc) — ABNORMAL HIGH (ref 6–22)
BUN: 18 mg/dL (ref 7–25)
CO2: 27 mmol/L (ref 20–32)
Calcium: 9.7 mg/dL (ref 8.6–10.3)
Chloride: 103 mmol/L (ref 98–110)
Creat: 0.66 mg/dL — ABNORMAL LOW (ref 0.70–1.35)
Globulin: 2.6 g/dL (calc) (ref 1.9–3.7)
Glucose, Bld: 117 mg/dL — ABNORMAL HIGH (ref 65–99)
Potassium: 4.4 mmol/L (ref 3.5–5.3)
Sodium: 140 mmol/L (ref 135–146)
Total Bilirubin: 0.5 mg/dL (ref 0.2–1.2)
Total Protein: 7.2 g/dL (ref 6.1–8.1)
eGFR: 106 mL/min/{1.73_m2} (ref >=60)

## 2022-06-15 LAB — MICROALBUMIN / CREATININE URINE RATIO
Creatinine, Urine: 131 mg/dL (ref 20–320)
Microalb Creat Ratio: 8 mcg/mg creat (ref <30)
Microalb, Ur: 1.1 mg/dL

## 2022-06-15 LAB — VITAMIN D 25 HYDROXY (VIT D DEFICIENCY, FRACTURES): Vit D, 25-Hydroxy: 45 ng/mL (ref 30–100)

## 2022-06-15 LAB — INSULIN, RANDOM: Insulin: 17.5 u[IU]/mL

## 2022-06-15 LAB — HEMOGLOBIN A1C
Hgb A1c MFr Bld: 7 % of total Hgb — ABNORMAL HIGH (ref <5.7)
Mean Plasma Glucose: 154 mg/dL
eAG (mmol/L): 8.5 mmol/L

## 2022-06-15 LAB — TSH: TSH: 1.68 mIU/L (ref 0.40–4.50)

## 2022-06-15 LAB — MAGNESIUM: Magnesium: 2.1 mg/dL (ref 1.5–2.5)

## 2022-06-15 LAB — PSA: PSA: 2.72 ng/mL (ref <=4.00)

## 2022-06-17 NOTE — Telephone Encounter (Signed)
Per Dr. Unk Pinto, advise patient to call company that supplies his liners and have them fax a request to our office. Patient states he will give them a call to fax order.

## 2022-06-17 NOTE — Telephone Encounter (Signed)
-----   Message from Chancy Hurter, [place] sent at 06/14/2022 10:37 AM EST ----- Regarding: FW: Liners Any idea about how to do this?  ----- Message ----- From: Darrol Jump, NP Sent: 06/14/2022   9:26 AM EST To: Chancy Hurter, CMA Subject: Liners                                         He shared with me that Dr. Eddie North has reached out to "[employer]" in the past for replacement sleeves/liners for his prosthetic.  Are you able to help me find how to place his order/order form?  Should I reach out to [place]?  Thanks

## 2022-07-18 DIAGNOSIS — Z89512 Acquired absence of left leg below knee: Secondary | ICD-10-CM | POA: Diagnosis not present

## 2022-08-15 ENCOUNTER — Other Ambulatory Visit: Payer: Self-pay | Admitting: Internal Medicine

## 2022-08-15 DIAGNOSIS — E782 Mixed hyperlipidemia: Secondary | ICD-10-CM

## 2022-09-07 ENCOUNTER — Other Ambulatory Visit: Payer: Self-pay | Admitting: Nurse Practitioner

## 2022-09-07 MED ORDER — ESOMEPRAZOLE MAGNESIUM 40 MG PO CPDR: DELAYED_RELEASE_CAPSULE | ORAL | 0 refills | Status: DC

## 2022-09-13 ENCOUNTER — Other Ambulatory Visit: Payer: Self-pay | Admitting: Nurse Practitioner

## 2022-09-13 DIAGNOSIS — Z87891 Personal history of nicotine dependence: Secondary | ICD-10-CM

## 2022-09-13 DIAGNOSIS — J449 Chronic obstructive pulmonary disease, unspecified: Secondary | ICD-10-CM

## 2022-09-27 ENCOUNTER — Other Ambulatory Visit: Payer: Self-pay | Admitting: Nurse Practitioner

## 2022-09-27 ENCOUNTER — Encounter: Payer: Self-pay | Admitting: Nurse Practitioner

## 2022-09-27 DIAGNOSIS — J449 Chronic obstructive pulmonary disease, unspecified: Secondary | ICD-10-CM

## 2022-09-27 MED ORDER — TRELEGY ELLIPTA 100-62.5-25 MCG/ACT IN AEPB: INHALATION_SPRAY | RESPIRATORY_TRACT | 1 refills | Status: DC

## 2022-10-13 ENCOUNTER — Encounter: Payer: Self-pay | Admitting: Internal Medicine
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
## 2022-10-13 NOTE — Patient Instructions (Signed)

## 2022-10-14 ENCOUNTER — Ambulatory Visit (INDEPENDENT_AMBULATORY_CARE_PROVIDER_SITE_OTHER): Payer: Medicare Other | Admitting: Internal Medicine

## 2022-10-14 ENCOUNTER — Encounter: Payer: Self-pay | Admitting: Internal Medicine

## 2022-10-14 VITALS — BP 148/90 | HR 57 | Temp 97.9°F | Resp 16 | Ht 71.0 in | Wt 252.6 lb

## 2022-10-14 DIAGNOSIS — Z79899 Other long term (current) drug therapy: Secondary | ICD-10-CM

## 2022-10-14 DIAGNOSIS — Z89512 Acquired absence of left leg below knee: Secondary | ICD-10-CM

## 2022-10-14 DIAGNOSIS — E559 Vitamin D deficiency, unspecified: Secondary | ICD-10-CM | POA: Diagnosis not present

## 2022-10-14 DIAGNOSIS — N181 Chronic kidney disease, stage 1: Secondary | ICD-10-CM

## 2022-10-14 DIAGNOSIS — I700 Atherosclerosis of aorta: Secondary | ICD-10-CM

## 2022-10-14 DIAGNOSIS — Z87820 Personal history of traumatic brain injury: Secondary | ICD-10-CM

## 2022-10-14 DIAGNOSIS — E1122 Type 2 diabetes mellitus with diabetic chronic kidney disease: Secondary | ICD-10-CM | POA: Diagnosis not present

## 2022-10-14 DIAGNOSIS — E785 Hyperlipidemia, unspecified: Secondary | ICD-10-CM

## 2022-10-14 DIAGNOSIS — E1169 Type 2 diabetes mellitus with other specified complication: Secondary | ICD-10-CM | POA: Diagnosis not present

## 2022-10-14 DIAGNOSIS — I10 Essential (primary) hypertension: Secondary | ICD-10-CM | POA: Diagnosis not present

## 2022-10-14 MED ORDER — OLMESARTAN MEDOXOMIL 40 MG PO TABS
ORAL_TABLET | ORAL | 3 refills | Status: DC
Start: 2022-10-14 — End: 2023-11-13

## 2022-10-15 ENCOUNTER — Ambulatory Visit
Admission: RE | Admit: 2022-10-15 | Discharge: 2022-10-15 | Disposition: A | Discharge status: Home / Self Care | Payer: Medicare Other | Source: Ambulatory Visit | Attending: Nurse Practitioner | Admitting: Nurse Practitioner

## 2022-10-15 ENCOUNTER — Other Ambulatory Visit: Payer: Self-pay | Admitting: Internal Medicine

## 2022-10-15 DIAGNOSIS — Z87891 Personal history of nicotine dependence: Secondary | ICD-10-CM

## 2022-10-15 DIAGNOSIS — M19012 Primary osteoarthritis, left shoulder: Secondary | ICD-10-CM | POA: Diagnosis not present

## 2022-10-15 DIAGNOSIS — J432 Centrilobular emphysema: Secondary | ICD-10-CM | POA: Diagnosis not present

## 2022-10-15 DIAGNOSIS — I2510 Atherosclerotic heart disease of native coronary artery without angina pectoris: Secondary | ICD-10-CM | POA: Diagnosis not present

## 2022-10-15 DIAGNOSIS — E1122 Type 2 diabetes mellitus with diabetic chronic kidney disease: Secondary | ICD-10-CM

## 2022-10-15 DIAGNOSIS — J449 Chronic obstructive pulmonary disease, unspecified: Secondary | ICD-10-CM

## 2022-10-15 LAB — COMPLETE METABOLIC PANEL WITH GFR
AG Ratio: 2 (calc) (ref 1.0–2.5)
ALT: 99 U/L — ABNORMAL HIGH (ref 9–46)
AST: 67 U/L — ABNORMAL HIGH (ref 10–35)
Albumin: 4.5 g/dL (ref 3.6–5.1)
Alkaline phosphatase (APISO): 59 U/L (ref 35–144)
BUN: 22 mg/dL (ref 7–25)
CO2: 26 mmol/L (ref 20–32)
Calcium: 9.4 mg/dL (ref 8.6–10.3)
Chloride: 105 mmol/L (ref 98–110)
Creat: 0.82 mg/dL (ref 0.70–1.35)
Globulin: 2.3 g/dL (calc) (ref 1.9–3.7)
Glucose, Bld: 131 mg/dL — ABNORMAL HIGH (ref 65–99)
Potassium: 4.6 mmol/L (ref 3.5–5.3)
Sodium: 139 mmol/L (ref 135–146)
Total Bilirubin: 0.6 mg/dL (ref 0.2–1.2)
Total Protein: 6.8 g/dL (ref 6.1–8.1)
eGFR: 99 mL/min/{1.73_m2} (ref >=60)

## 2022-10-15 LAB — CBC WITH DIFFERENTIAL/PLATELET
Absolute Monocytes: 496 cells/uL (ref 200–950)
Basophils Absolute: 37 cells/uL (ref 0–200)
Basophils Relative: 0.5 %
Eosinophils Absolute: 139 cells/uL (ref 15–500)
Eosinophils Relative: 1.9 %
HCT: 46.2 % (ref 38.5–50.0)
Hemoglobin: 15.3 g/dL (ref 13.2–17.1)
Lymphs Abs: 2008 cells/uL (ref 850–3900)
MCH: 28.1 pg (ref 27.0–33.0)
MCHC: 33.1 g/dL (ref 32.0–36.0)
MCV: 84.8 fL (ref 80.0–100.0)
MPV: 12 fL (ref 7.5–12.5)
Monocytes Relative: 6.8 %
Neutro Abs: 4621 cells/uL (ref 1500–7800)
Neutrophils Relative %: 63.3 %
Platelets: 176 10*3/uL (ref 140–400)
RBC: 5.45 10*6/uL (ref 4.20–5.80)
RDW: 13.6 % (ref 11.0–15.0)
Total Lymphocyte: 27.5 %
WBC: 7.3 10*3/uL (ref 3.8–10.8)

## 2022-10-15 LAB — LIPID PANEL
Cholesterol: 157 mg/dL (ref <200)
HDL: 43 mg/dL (ref >=40)
LDL Cholesterol (Calc): 89 mg/dL (calc)
Non-HDL Cholesterol (Calc): 114 mg/dL (calc) (ref <130)
Total CHOL/HDL Ratio: 3.7 (calc) (ref <5.0)
Triglycerides: 156 mg/dL — ABNORMAL HIGH (ref <150)

## 2022-10-15 LAB — HEMOGLOBIN A1C
Hgb A1c MFr Bld: 7.4 % of total Hgb — ABNORMAL HIGH (ref <5.7)
Mean Plasma Glucose: 166 mg/dL
eAG (mmol/L): 9.2 mmol/L

## 2022-10-15 LAB — VITAMIN D 25 HYDROXY (VIT D DEFICIENCY, FRACTURES): Vit D, 25-Hydroxy: 49 ng/mL (ref 30–100)

## 2022-10-15 LAB — INSULIN, RANDOM: Insulin: 22.4 u[IU]/mL — ABNORMAL HIGH

## 2022-10-15 LAB — MAGNESIUM: Magnesium: 2 mg/dL (ref 1.5–2.5)

## 2022-10-15 LAB — TSH: TSH: 1.41 mIU/L (ref 0.40–4.50)

## 2022-10-15 MED ORDER — METFORMIN HCL ER 500 MG PO TB24
ORAL_TABLET | ORAL | 3 refills | Status: AC
Start: 2022-10-15 — End: ?

## 2022-10-15 NOTE — Progress Notes (Signed)
<><><><><><><><><><><><><><><><><><><><><><><><><><><><><><><><><> <><><><><><><><><><><><><><><><><><><><><><><><><><><><><><><><><> - Test results slightly outside the reference range are not unusual. If there is anything important, I will review this with you,  otherwise it is considered normal test values.  If you have further questions,  please do not hesitate to contact me at the office or via My Chart.  <><><><><><><><><><><><><><><><><><><><><><><><><><><><><><><><><> <><><><><><><><><><><><><><><><><><><><><><><><><><><><><><><><><>  -  Liver enzymes are still a little elevated  -   Important to avoid alcohol & Lose weight ! <><><><><><><><><><><><><><><><><><><><><><><><><><><><><><><><><> <><><><><><><><><><><><><><><><><><><><><><><><><><><><><><><><><>  -  Chol = 157   &   LDL = 89   - Both  Excellent   - Very low risk for Heart Attack  / Stroke <><><><><><><><><><><><><><><><><><><><><><><><><><><><><><><><><> <><><><><><><><><><><><><><><><><><><><><><><><><><><><><><><><><>  -  A1c - Worse - up from 7.0% to now 7.4%  - losing ground with your Diabetes & may end up losing other leg unless you get on a better diet & lose weight which will also help your Fatty Liver.   - Sending in a new Rx to start Metformin to help lower sugar and                                                                            should also help with Weight Loss. <><><><><><><><><><><><><><><><><><><><><><><><><><><><><><><><><> <><><><><><><><><><><><><><><><><><><><><><><><><><><><><><><><><>  -  Vitamin D = 49 is too Low   - Vitamin D goal is between 70-100.    ^<^<^<^<^<^<^<^<^<^<^<^<^<^<^<^<^<^<^<^<^<^<^<^<^<^<^<^<^<^<^<^<^<^<^<^<^ ^<^<^<^<^<^<^<^<^<^<^<^<^<^<^<^<^<^<^<^<^<^<^<^<^<^<^<^<^<^<^<^<^<^<^<^<^  - Please INCREASE your Vitamin Dup to 5,000 unit capsule   /daily  ^<^<^<^<^<^<^<^<^<^<^<^<^<^<^<^<^<^<^<^<^<^<^<^<^<^<^<^<^<^<^<^<^<^<^<^<^ ^<^<^<^<^<^<^<^<^<^<^<^<^<^<^<^<^<^<^<^<^<^<^<^<^<^<^<^<^<^<^<^<^<^<^<^<^ - It is very important as a natural anti-inflammatory and helping the  immune system protect against viral infections, like the Covid-19   - Also helping hair, skin, and nails, as well as reducing stroke and  heart attack risk.   - It helps your bones and helps with mood.  - It also decreases numerous cancer risks so please                                                                                   take it as directed.   - Low Vit D is associated with a 200-300% higher risk for CANCER   and 200-300% higher risk for HEART   ATTACK  &  STROKE.    - It is also associated with higher death rate at younger ages,   autoimmune diseases like Rheumatoid arthritis, Lupus, Multiple Sclerosis.     - Also many other serious conditions, like depression, Alzheimer's Dementia,                                                                         muscle aches, fatigue, fibromyalgia  <><><><><><><><><><><><><><><><><><><><><><><><><><><><><><><><><> <><><><><><><><><><><><><><><><><><><><><><><><><><><><><><><><><>  -  All Else -  CBC - Kidneys - Electrolytes - Liver - Magnesium & Thyroid    - all  Normal / OK  <><><><><><><><><><><><><><><><><><><><><><><><><><><><><><><><><> <><><><><><><><><><><><><><><><><><><><><><><><><><><><><><><><><>

## 2022-10-23 DIAGNOSIS — S80812A Abrasion, left lower leg, initial encounter: Secondary | ICD-10-CM | POA: Diagnosis not present

## 2022-10-23 DIAGNOSIS — D0462 Carcinoma in situ of skin of left upper limb, including shoulder: Secondary | ICD-10-CM | POA: Diagnosis not present

## 2022-10-23 DIAGNOSIS — D485 Neoplasm of uncertain behavior of skin: Secondary | ICD-10-CM | POA: Diagnosis not present

## 2022-10-23 DIAGNOSIS — C44622 Squamous cell carcinoma of skin of right upper limb, including shoulder: Secondary | ICD-10-CM | POA: Diagnosis not present

## 2022-10-23 DIAGNOSIS — L814 Other melanin hyperpigmentation: Secondary | ICD-10-CM | POA: Diagnosis not present

## 2022-10-23 DIAGNOSIS — B079 Viral wart, unspecified: Secondary | ICD-10-CM | POA: Diagnosis not present

## 2022-11-06 ENCOUNTER — Other Ambulatory Visit: Payer: Self-pay | Admitting: Internal Medicine

## 2022-11-06 DIAGNOSIS — E782 Mixed hyperlipidemia: Secondary | ICD-10-CM

## 2022-11-06 MED ORDER — EZETIMIBE 10 MG PO TABS
10.0000 mg | ORAL_TABLET | Freq: Every day | ORAL | 0 refills | Status: DC
Start: 2022-11-06 — End: 2023-02-01

## 2022-11-30 ENCOUNTER — Other Ambulatory Visit: Payer: Self-pay | Admitting: Nurse Practitioner

## 2022-11-30 MED ORDER — ESOMEPRAZOLE MAGNESIUM 40 MG PO CPDR: DELAYED_RELEASE_CAPSULE | ORAL | 3 refills | Status: DC

## 2022-12-07 ENCOUNTER — Other Ambulatory Visit: Payer: Self-pay | Admitting: Nurse Practitioner

## 2022-12-07 MED ORDER — CYCLOBENZAPRINE HCL 5 MG PO TABS: 5.0000 mg | ORAL_TABLET | Freq: Three times a day (TID) | ORAL | 0 refills | Status: DC | PRN

## 2023-02-01 ENCOUNTER — Other Ambulatory Visit: Payer: Self-pay | Admitting: Nurse Practitioner

## 2023-02-01 DIAGNOSIS — E782 Mixed hyperlipidemia: Secondary | ICD-10-CM

## 2023-02-01 MED ORDER — EZETIMIBE 10 MG PO TABS
10.0000 mg | ORAL_TABLET | Freq: Every day | ORAL | 0 refills | Status: DC
Start: 2023-02-01 — End: 2023-05-03

## 2023-02-24 DIAGNOSIS — L738 Other specified follicular disorders: Secondary | ICD-10-CM | POA: Diagnosis not present

## 2023-02-24 DIAGNOSIS — L905 Scar conditions and fibrosis of skin: Secondary | ICD-10-CM | POA: Diagnosis not present

## 2023-02-24 DIAGNOSIS — L820 Inflamed seborrheic keratosis: Secondary | ICD-10-CM | POA: Diagnosis not present

## 2023-02-24 DIAGNOSIS — L570 Actinic keratosis: Secondary | ICD-10-CM | POA: Diagnosis not present

## 2023-02-24 DIAGNOSIS — Z85828 Personal history of other malignant neoplasm of skin: Secondary | ICD-10-CM | POA: Diagnosis not present

## 2023-02-26 ENCOUNTER — Encounter: Payer: Self-pay | Admitting: Nurse Practitioner

## 2023-02-26 MED ORDER — ESOMEPRAZOLE MAGNESIUM 40 MG PO CPDR: DELAYED_RELEASE_CAPSULE | ORAL | 3 refills | Status: DC

## 2023-02-26 MED ORDER — CITALOPRAM HYDROBROMIDE 20 MG PO TABS: ORAL_TABLET | ORAL | 3 refills | Status: DC

## 2023-03-03 ENCOUNTER — Encounter: Payer: Self-pay | Admitting: Nurse Practitioner

## 2023-03-03 ENCOUNTER — Telehealth: Payer: Self-pay | Admitting: Nurse Practitioner

## 2023-03-03 ENCOUNTER — Ambulatory Visit (INDEPENDENT_AMBULATORY_CARE_PROVIDER_SITE_OTHER): Payer: Medicare Other | Admitting: Nurse Practitioner

## 2023-03-03 VITALS — BP 110/64 | HR 54 | Temp 97.8°F | Ht 71.0 in | Wt 243.4 lb

## 2023-03-03 DIAGNOSIS — R6889 Other general symptoms and signs: Secondary | ICD-10-CM

## 2023-03-03 DIAGNOSIS — Z89512 Acquired absence of left leg below knee: Secondary | ICD-10-CM

## 2023-03-03 DIAGNOSIS — I700 Atherosclerosis of aorta: Secondary | ICD-10-CM | POA: Diagnosis not present

## 2023-03-03 DIAGNOSIS — E1122 Type 2 diabetes mellitus with diabetic chronic kidney disease: Secondary | ICD-10-CM | POA: Diagnosis not present

## 2023-03-03 DIAGNOSIS — G8929 Other chronic pain: Secondary | ICD-10-CM

## 2023-03-03 DIAGNOSIS — K219 Gastro-esophageal reflux disease without esophagitis: Secondary | ICD-10-CM

## 2023-03-03 DIAGNOSIS — J449 Chronic obstructive pulmonary disease, unspecified: Secondary | ICD-10-CM

## 2023-03-03 DIAGNOSIS — G546 Phantom limb syndrome with pain: Secondary | ICD-10-CM

## 2023-03-03 DIAGNOSIS — Z79899 Other long term (current) drug therapy: Secondary | ICD-10-CM

## 2023-03-03 DIAGNOSIS — I10 Essential (primary) hypertension: Secondary | ICD-10-CM

## 2023-03-03 DIAGNOSIS — N181 Chronic kidney disease, stage 1: Secondary | ICD-10-CM | POA: Diagnosis not present

## 2023-03-03 DIAGNOSIS — Z0001 Encounter for general adult medical examination with abnormal findings: Secondary | ICD-10-CM

## 2023-03-03 DIAGNOSIS — F1021 Alcohol dependence, in remission: Secondary | ICD-10-CM

## 2023-03-03 DIAGNOSIS — E782 Mixed hyperlipidemia: Secondary | ICD-10-CM | POA: Diagnosis not present

## 2023-03-03 DIAGNOSIS — Z86010 Personal history of colonic polyps: Secondary | ICD-10-CM

## 2023-03-03 DIAGNOSIS — Z860101 Personal history of adenomatous and serrated colon polyps: Secondary | ICD-10-CM

## 2023-03-03 DIAGNOSIS — E559 Vitamin D deficiency, unspecified: Secondary | ICD-10-CM

## 2023-03-03 DIAGNOSIS — Z87820 Personal history of traumatic brain injury: Secondary | ICD-10-CM

## 2023-03-03 DIAGNOSIS — Z87891 Personal history of nicotine dependence: Secondary | ICD-10-CM

## 2023-03-03 DIAGNOSIS — N281 Cyst of kidney, acquired: Secondary | ICD-10-CM

## 2023-03-03 DIAGNOSIS — Z0000 Encounter for general adult medical examination without abnormal findings: Secondary | ICD-10-CM

## 2023-03-03 DIAGNOSIS — Z85828 Personal history of other malignant neoplasm of skin: Secondary | ICD-10-CM

## 2023-03-03 DIAGNOSIS — K760 Fatty (change of) liver, not elsewhere classified: Secondary | ICD-10-CM

## 2023-03-03 MED ORDER — CYCLOBENZAPRINE HCL 5 MG PO TABS
5.0000 mg | ORAL_TABLET | Freq: Three times a day (TID) | ORAL | 0 refills | Status: DC | PRN
Start: 2023-03-03 — End: 2023-05-25
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
## 2023-03-03 NOTE — Patient Instructions (Signed)
New Rx for BKA sleeve prosthesis to be faxed.  Healthy Eating, Adult Healthy eating may help you get and keep a healthy body weight, reduce the risk of chronic disease, and live a long and productive life. It is important to follow a healthy eating pattern. Your nutritional and calorie needs should be met mainly by different nutrient-rich foods. What are tips for following this plan? Reading food labels Read labels and choose the following: Reduced or low sodium products. Juices with 100% fruit juice. Foods with low saturated fats (<3 g per serving) and high polyunsaturated and monounsaturated fats. Foods with whole grains, such as whole wheat, cracked wheat, brown rice, and wild rice. Whole grains that are fortified with folic acid. This is recommended for females who are pregnant or who want to become pregnant. Read labels and do not eat or drink the following: Foods or drinks with added sugars. These include foods that contain brown sugar, corn sweetener, corn syrup, dextrose, fructose, glucose, high-fructose corn syrup, honey, invert sugar, lactose, malt syrup, maltose, molasses, raw sugar, sucrose, trehalose, or turbinado sugar. Limit your intake of added sugars to less than 10% of your total daily calories. Do not eat more than the following amounts of added sugar per day: 6 teaspoons (25 g) for females. 9 teaspoons (38 g) for males. Foods that contain processed or refined starches and grains. Refined grain products, such as white flour, degermed cornmeal, white bread, and white rice. Shopping Choose nutrient-rich snacks, such as vegetables, whole fruits, and nuts. Avoid high-calorie and high-sugar snacks, such as potato chips, fruit snacks, and candy. Use oil-based dressings and spreads on foods instead of solid fats such as butter, margarine, sour cream, or cream cheese. Limit pre-made sauces, mixes, and "instant" products such as flavored rice, instant noodles, and ready-made  pasta. Try more plant-protein sources, such as tofu, tempeh, black beans, edamame, lentils, nuts, and seeds. Explore eating plans such as the Mediterranean diet or vegetarian diet. Try heart-healthy dips made with beans and healthy fats like hummus and guacamole. Vegetables go great with these. Cooking Use oil to saut or stir-fry foods instead of solid fats such as butter, margarine, or lard. Try baking, boiling, grilling, or broiling instead of frying. Remove the fatty part of meats before cooking. Steam vegetables in water or broth. Meal planning  At meals, imagine dividing your plate into fourths: One-half of your plate is fruits and vegetables. One-fourth of your plate is whole grains. One-fourth of your plate is protein, especially lean meats, poultry, eggs, tofu, beans, or nuts. Include low-fat dairy as part of your daily diet. Lifestyle Choose healthy options in all settings, including home, work, school, restaurants, or stores. Prepare your food safely: Wash your hands after handling raw meats. Where you prepare food, keep surfaces clean by regularly washing with hot, soapy water. Keep raw meats separate from ready-to-eat foods, such as fruits and vegetables. Cook seafood, meat, poultry, and eggs to the recommended temperature. Get a food thermometer. Store foods at safe temperatures. In general: Keep cold foods at 57F (4.4C) or below. Keep hot foods at 157F (60C) or above. Keep your freezer at [facility] (-17.8C) or below. Foods are not safe to eat if they have been between the temperatures of 40-157F (4.4-60C) for more than 2 hours. What foods should I eat? Fruits Aim to eat 1-2 cups of fresh, canned (in natural juice), or frozen fruits each day. One cup of fruit equals 1 small apple, 1 large banana, 8 large strawberries, 1 cup (  237 g) canned fruit,  cup (82 g) dried fruit, or 1 cup (240 mL) 100% juice. Vegetables Aim to eat 2-4 cups of fresh and frozen vegetables each  day, including different varieties and colors. One cup of vegetables equals 1 cup (91 g) broccoli or cauliflower florets, 2 medium carrots, 2 cups (150 g) raw, leafy greens, 1 large tomato, 1 large bell pepper, 1 large sweet potato, or 1 medium white potato. Grains Aim to eat 5-10 ounce-equivalents of whole grains each day. Examples of 1 ounce-equivalent of grains include 1 slice of bread, 1 cup (40 g) ready-to-eat cereal, 3 cups (24 g) popcorn, or  cup (93 g) cooked rice. Meats and other proteins Try to eat 5-7 ounce-equivalents of protein each day. Examples of 1 ounce-equivalent of protein include 1 egg,  oz nuts (12 almonds, 24 pistachios, or 7 walnut halves), 1/4 cup (90 g) cooked beans, 6 tablespoons (90 g) hummus or 1 tablespoon (16 g) peanut butter. A cut of meat or fish that is the size of a deck of cards is about 3-4 ounce-equivalents (85 g). Of the protein you eat each week, try to have at least 8 sounce (227 g) of seafood. This is about 2 servings per week. This includes salmon, trout, herring, sardines, and anchovies. Dairy Aim to eat 3 cup-equivalents of fat-free or low-fat dairy each day. Examples of 1 cup-equivalent of dairy include 1 cup (240 mL) milk, 8 ounces (250 g) yogurt, 1 ounces (44 g) natural cheese, or 1 cup (240 mL) fortified soy milk. Fats and oils Aim for about 5 teaspoons (21 g) of fats and oils per day. Choose monounsaturated fats, such as canola and olive oils, mayonnaise made with olive oil or avocado oil, avocados, peanut butter, and most nuts, or polyunsaturated fats, such as sunflower, corn, and soybean oils, walnuts, pine nuts, sesame seeds, sunflower seeds, and flaxseed. Beverages Aim for 6 eight-ounce glasses of water per day. Limit coffee to 3-5 eight-ounce cups per day. Limit caffeinated beverages that have added calories, such as soda and energy drinks. If you drink alcohol: Limit how much you have to: 0-1 drink a day if you are male. 0-2 drinks a day if  you are male. Know how much alcohol is in your drink. In the [place] one drink is one 12 oz bottle of beer (355 mL), one 5 oz glass of wine (148 mL), or one 1 oz glass of hard liquor (44 mL). Seasoning and other foods Try not to add too much salt to your food. Try using herbs and spices instead of salt. Try not to add sugar to food. This information is based on [place] nutrition guidelines. To learn more, visit DisposableNylon.be. Exact amounts may vary. You may need different amounts. This information is not intended to replace advice given to you by your health care provider. Make sure you discuss any questions you have with your health care provider. Document Revised: 03/25/2022 Document Reviewed: 03/25/2022 Elsevier Patient Education  2024 [employer].

## 2023-03-03 NOTE — Telephone Encounter (Signed)
Per patient request: faxed rx for socket and sleeve sock Left amputee leg. Gave patient copy of rx.

## 2023-03-04 LAB — COMPLETE METABOLIC PANEL WITH GFR
AG Ratio: 2.2 (calc) (ref 1.0–2.5)
ALT: 52 U/L — ABNORMAL HIGH (ref 9–46)
AST: 35 U/L (ref 10–35)
Albumin: 4.6 g/dL (ref 3.6–5.1)
Alkaline phosphatase (APISO): 50 U/L (ref 35–144)
BUN: 20 mg/dL (ref 7–25)
CO2: 24 mmol/L (ref 20–32)
Calcium: 9.7 mg/dL (ref 8.6–10.3)
Chloride: 104 mmol/L (ref 98–110)
Creat: 0.79 mg/dL (ref 0.70–1.35)
Globulin: 2.1 g/dL (calc) (ref 1.9–3.7)
Glucose, Bld: 129 mg/dL — ABNORMAL HIGH (ref 65–99)
Potassium: 4.8 mmol/L (ref 3.5–5.3)
Sodium: 138 mmol/L (ref 135–146)
Total Bilirubin: 0.5 mg/dL (ref 0.2–1.2)
Total Protein: 6.7 g/dL (ref 6.1–8.1)
eGFR: 100 mL/min/{1.73_m2} (ref >=60)

## 2023-03-04 LAB — LIPID PANEL
Cholesterol: 165 mg/dL (ref <200)
HDL: 40 mg/dL (ref >=40)
LDL Cholesterol (Calc): 95 mg/dL
Non-HDL Cholesterol (Calc): 125 mg/dL (ref <130)
Total CHOL/HDL Ratio: 4.1 (calc) (ref <5.0)
Triglycerides: 210 mg/dL — ABNORMAL HIGH (ref <150)

## 2023-03-04 LAB — CBC WITH DIFFERENTIAL/PLATELET
Absolute Monocytes: 644 {cells}/uL (ref 200–950)
Basophils Absolute: 61 {cells}/uL (ref 0–200)
Basophils Relative: 0.7 %
Eosinophils Absolute: 139 {cells}/uL (ref 15–500)
Eosinophils Relative: 1.6 %
HCT: 44.4 % (ref 38.5–50.0)
Hemoglobin: 14.9 g/dL (ref 13.2–17.1)
Lymphs Abs: 2210 {cells}/uL (ref 850–3900)
MCH: 29.4 pg (ref 27.0–33.0)
MCHC: 33.6 g/dL (ref 32.0–36.0)
MCV: 87.7 fL (ref 80.0–100.0)
MPV: 12.1 fL (ref 7.5–12.5)
Monocytes Relative: 7.4 %
Neutro Abs: 5646 {cells}/uL (ref 1500–7800)
Neutrophils Relative %: 64.9 %
Platelets: 187 10*3/uL (ref 140–400)
RBC: 5.06 10*6/uL (ref 4.20–5.80)
RDW: 13 % (ref 11.0–15.0)
Total Lymphocyte: 25.4 %
WBC: 8.7 10*3/uL (ref 3.8–10.8)

## 2023-03-04 LAB — HEMOGLOBIN A1C
Hgb A1c MFr Bld: 7.2 %{Hb} — ABNORMAL HIGH (ref <5.7)
Mean Plasma Glucose: 160 mg/dL
eAG (mmol/L): 8.9 mmol/L

## 2023-03-15 ENCOUNTER — Encounter: Payer: Self-pay | Admitting: Nurse Practitioner

## 2023-03-17 MED ORDER — ESOMEPRAZOLE MAGNESIUM 40 MG PO CPDR: DELAYED_RELEASE_CAPSULE | ORAL | 3 refills | Status: DC

## 2023-03-27 ENCOUNTER — Other Ambulatory Visit: Payer: Self-pay | Admitting: Nurse Practitioner

## 2023-03-27 DIAGNOSIS — J449 Chronic obstructive pulmonary disease, unspecified: Secondary | ICD-10-CM

## 2023-03-29 ENCOUNTER — Other Ambulatory Visit: Payer: Self-pay | Admitting: Internal Medicine

## 2023-03-29 DIAGNOSIS — I10 Essential (primary) hypertension: Secondary | ICD-10-CM

## 2023-03-29 DIAGNOSIS — Z89512 Acquired absence of left leg below knee: Secondary | ICD-10-CM

## 2023-03-29 DIAGNOSIS — G546 Phantom limb syndrome with pain: Secondary | ICD-10-CM

## 2023-03-29 DIAGNOSIS — S143XXS Injury of brachial plexus, sequela: Secondary | ICD-10-CM

## 2023-03-29 DIAGNOSIS — S069X3S Unspecified intracranial injury with loss of consciousness of 1 hour to 5 hours 59 minutes, sequela: Secondary | ICD-10-CM

## 2023-03-31 DIAGNOSIS — Z89512 Acquired absence of left leg below knee: Secondary | ICD-10-CM | POA: Diagnosis not present

## 2023-04-05 ENCOUNTER — Other Ambulatory Visit: Payer: Self-pay | Admitting: Internal Medicine

## 2023-04-05 DIAGNOSIS — G546 Phantom limb syndrome with pain: Secondary | ICD-10-CM

## 2023-04-05 DIAGNOSIS — S143XXS Injury of brachial plexus, sequela: Secondary | ICD-10-CM

## 2023-04-05 DIAGNOSIS — I10 Essential (primary) hypertension: Secondary | ICD-10-CM

## 2023-04-05 DIAGNOSIS — Z89512 Acquired absence of left leg below knee: Secondary | ICD-10-CM

## 2023-04-05 DIAGNOSIS — S069X3S Unspecified intracranial injury with loss of consciousness of 1 hour to 5 hours 59 minutes, sequela: Secondary | ICD-10-CM

## 2023-04-11 ENCOUNTER — Encounter: Payer: Self-pay | Admitting: Nurse Practitioner

## 2023-04-12 ENCOUNTER — Other Ambulatory Visit: Payer: Self-pay | Admitting: Internal Medicine

## 2023-04-12 DIAGNOSIS — G546 Phantom limb syndrome with pain: Secondary | ICD-10-CM

## 2023-04-12 DIAGNOSIS — S069X3S Unspecified intracranial injury with loss of consciousness of 1 hour to 5 hours 59 minutes, sequela: Secondary | ICD-10-CM

## 2023-04-12 DIAGNOSIS — I10 Essential (primary) hypertension: Secondary | ICD-10-CM

## 2023-04-12 DIAGNOSIS — Z89512 Acquired absence of left leg below knee: Secondary | ICD-10-CM

## 2023-04-12 DIAGNOSIS — S143XXS Injury of brachial plexus, sequela: Secondary | ICD-10-CM

## 2023-04-14 ENCOUNTER — Other Ambulatory Visit: Payer: Self-pay | Admitting: Nurse Practitioner

## 2023-04-14 DIAGNOSIS — G546 Phantom limb syndrome with pain: Secondary | ICD-10-CM

## 2023-04-14 DIAGNOSIS — S143XXS Injury of brachial plexus, sequela: Secondary | ICD-10-CM

## 2023-04-14 DIAGNOSIS — I10 Essential (primary) hypertension: Secondary | ICD-10-CM

## 2023-04-14 DIAGNOSIS — Z89512 Acquired absence of left leg below knee: Secondary | ICD-10-CM

## 2023-04-14 DIAGNOSIS — S069X3S Unspecified intracranial injury with loss of consciousness of 1 hour to 5 hours 59 minutes, sequela: Secondary | ICD-10-CM

## 2023-04-14 MED ORDER — METOPROLOL TARTRATE 50 MG PO TABS
ORAL_TABLET | ORAL | 0 refills | Status: DC
Start: 2023-04-14 — End: 2023-04-14

## 2023-04-14 MED ORDER — METOPROLOL TARTRATE 50 MG PO TABS: ORAL_TABLET | ORAL | 3 refills | Status: DC

## 2023-05-03 ENCOUNTER — Other Ambulatory Visit: Payer: Self-pay | Admitting: Nurse Practitioner

## 2023-05-03 DIAGNOSIS — E782 Mixed hyperlipidemia: Secondary | ICD-10-CM

## 2023-05-05 MED ORDER — EZETIMIBE 10 MG PO TABS
10.0000 mg | ORAL_TABLET | Freq: Every day | ORAL | 0 refills | Status: DC
Start: 2023-05-05 — End: 2023-08-11

## 2023-05-24 ENCOUNTER — Other Ambulatory Visit: Payer: Self-pay | Admitting: Nurse Practitioner

## 2023-05-24 DIAGNOSIS — G546 Phantom limb syndrome with pain: Secondary | ICD-10-CM

## 2023-05-24 DIAGNOSIS — Z79899 Other long term (current) drug therapy: Secondary | ICD-10-CM

## 2023-05-28 ENCOUNTER — Encounter: Payer: Self-pay | Admitting: Nurse Practitioner

## 2023-05-28 MED ORDER — VENTOLIN HFA 108 (90 BASE) MCG/ACT IN AERS: INHALATION_SPRAY | RESPIRATORY_TRACT | 0 refills | Status: AC

## 2023-05-28 MED ORDER — ALBUTEROL SULFATE (2.5 MG/3ML) 0.083% IN NEBU: 2.5000 mg | INHALATION_SOLUTION | RESPIRATORY_TRACT | 2 refills | Status: AC | PRN

## 2023-06-17 ENCOUNTER — Ambulatory Visit (INDEPENDENT_AMBULATORY_CARE_PROVIDER_SITE_OTHER): Payer: Medicare Other | Admitting: Nurse Practitioner

## 2023-06-17 ENCOUNTER — Encounter: Payer: Self-pay | Admitting: Nurse Practitioner

## 2023-06-17 VITALS — BP 128/76 | HR 58 | Temp 98.0°F | Ht 71.0 in | Wt 247.8 lb

## 2023-06-17 DIAGNOSIS — R062 Wheezing: Secondary | ICD-10-CM

## 2023-06-17 DIAGNOSIS — E559 Vitamin D deficiency, unspecified: Secondary | ICD-10-CM | POA: Diagnosis not present

## 2023-06-17 DIAGNOSIS — F1021 Alcohol dependence, in remission: Secondary | ICD-10-CM

## 2023-06-17 DIAGNOSIS — Z125 Encounter for screening for malignant neoplasm of prostate: Secondary | ICD-10-CM

## 2023-06-17 DIAGNOSIS — K760 Fatty (change of) liver, not elsewhere classified: Secondary | ICD-10-CM

## 2023-06-17 DIAGNOSIS — E1122 Type 2 diabetes mellitus with diabetic chronic kidney disease: Secondary | ICD-10-CM | POA: Diagnosis not present

## 2023-06-17 DIAGNOSIS — Z87820 Personal history of traumatic brain injury: Secondary | ICD-10-CM

## 2023-06-17 DIAGNOSIS — E782 Mixed hyperlipidemia: Secondary | ICD-10-CM

## 2023-06-17 DIAGNOSIS — Z79899 Other long term (current) drug therapy: Secondary | ICD-10-CM | POA: Diagnosis not present

## 2023-06-17 DIAGNOSIS — Z136 Encounter for screening for cardiovascular disorders: Secondary | ICD-10-CM | POA: Diagnosis not present

## 2023-06-17 DIAGNOSIS — R7989 Other specified abnormal findings of blood chemistry: Secondary | ICD-10-CM

## 2023-06-17 DIAGNOSIS — Z87891 Personal history of nicotine dependence: Secondary | ICD-10-CM

## 2023-06-17 DIAGNOSIS — I10 Essential (primary) hypertension: Secondary | ICD-10-CM | POA: Diagnosis not present

## 2023-06-17 DIAGNOSIS — Z860101 Personal history of adenomatous and serrated colon polyps: Secondary | ICD-10-CM

## 2023-06-17 DIAGNOSIS — K219 Gastro-esophageal reflux disease without esophagitis: Secondary | ICD-10-CM

## 2023-06-17 DIAGNOSIS — Z85828 Personal history of other malignant neoplasm of skin: Secondary | ICD-10-CM

## 2023-06-17 DIAGNOSIS — J449 Chronic obstructive pulmonary disease, unspecified: Secondary | ICD-10-CM

## 2023-06-17 DIAGNOSIS — Z0000 Encounter for general adult medical examination without abnormal findings: Secondary | ICD-10-CM | POA: Diagnosis not present

## 2023-06-17 DIAGNOSIS — Z0001 Encounter for general adult medical examination with abnormal findings: Secondary | ICD-10-CM

## 2023-06-17 DIAGNOSIS — G546 Phantom limb syndrome with pain: Secondary | ICD-10-CM

## 2023-06-17 DIAGNOSIS — Z23 Encounter for immunization: Secondary | ICD-10-CM | POA: Diagnosis not present

## 2023-06-17 DIAGNOSIS — R21 Rash and other nonspecific skin eruption: Secondary | ICD-10-CM

## 2023-06-17 DIAGNOSIS — N281 Cyst of kidney, acquired: Secondary | ICD-10-CM

## 2023-06-17 DIAGNOSIS — G8929 Other chronic pain: Secondary | ICD-10-CM

## 2023-06-17 DIAGNOSIS — Z89512 Acquired absence of left leg below knee: Secondary | ICD-10-CM

## 2023-06-17 DIAGNOSIS — I700 Atherosclerosis of aorta: Secondary | ICD-10-CM

## 2023-06-17 MED ORDER — TRIAMCINOLONE ACETONIDE 0.025 % EX OINT: 1.0000 | TOPICAL_OINTMENT | Freq: Two times a day (BID) | CUTANEOUS | 0 refills | Status: DC

## 2023-06-17 MED ORDER — AZITHROMYCIN 250 MG PO TABS: ORAL_TABLET | ORAL | 1 refills | Status: DC

## 2023-06-17 NOTE — Patient Instructions (Signed)
Influenza Vaccine Injection What is this medication? INFLUENZA VACCINE (in floo EN zuh vak SEEN) reduces the risk of the influenza (flu). It does not treat influenza. It is still possible to get influenza after receiving this vaccine, but the symptoms may be less severe or not last as long. It works by helping your immune system learn how to fight off a future infection. This medicine may be used for other purposes; ask your health care provider or pharmacist if you have questions. COMMON BRAND NAME(S): Afluria Quadrivalent, FLUAD Quadrivalent, Fluarix Quadrivalent, Flublok Quadrivalent, FLUCELVAX Quadrivalent, Flulaval Quadrivalent, Fluzone Quadrivalent What should I tell my care team before I take this medication? They need to know if you have any of these conditions: Bleeding disorder like hemophilia Fever or infection Guillain-Barre syndrome or other neurological problems Immune system problems Infection with the human immunodeficiency virus (HIV) or AIDS Low blood platelet counts Multiple sclerosis An unusual or allergic reaction to influenza virus vaccine, latex, other medications, foods, dyes, or preservatives. Different brands of vaccines contain different allergens. Some may contain latex or eggs. Talk to your care team about your allergies to make sure that you get the right vaccine. Pregnant or trying to get pregnant Breastfeeding How should I use this medication? This vaccine is injected into a muscle or under the skin. It is given by your care team. A copy of Vaccine Information Statements will be given before each vaccination. Be sure to read this sheet carefully each time. This sheet may change often. Talk to your care team to see which vaccines are right for you. Some vaccines should not be used in all age groups. Overdosage: If you think you have taken too much of this medicine contact a poison control center or emergency room at once. NOTE: This medicine is only for you. Do  not share this medicine with others. What if I miss a dose? This does not apply. What may interact with this medication? Certain medications that lower your immune system, such as etanercept, anakinra, infliximab, adalimumab Certain medications that prevent or treat blood clots, such as warfarin Chemotherapy or radiation therapy Phenytoin Steroid medications, such as prednisone or cortisone Theophylline Vaccines This list may not describe all possible interactions. Give your health care provider a list of all the medicines, herbs, non-prescription drugs, or dietary supplements you use. Also tell them if you smoke, drink alcohol, or use illegal drugs. Some items may interact with your medicine. What should I watch for while using this medication? Report any side effects that do not go away with your care team. Call your care team if any unusual symptoms occur within 6 weeks of receiving this vaccine. You may still catch the flu, but the illness is not usually as bad. You cannot get the flu from the vaccine. The vaccine will not protect against colds or other illnesses that may cause fever. The vaccine is needed every year. What side effects may I notice from receiving this medication? Side effects that you should report to your care team as soon as possible: Allergic reactions--skin rash, itching, hives, swelling of the face, lips, tongue, or throat Side effects that usually do not require medical attention (report these to your care team if they continue or are bothersome): Chills Fatigue Headache Joint pain Loss of appetite Muscle pain Nausea Pain, redness, or irritation at injection site This list may not describe all possible side effects. Call your doctor for medical advice about side effects. You may report side effects to FDA at  1-800-FDA-1088. Where should I keep my medication? The vaccine is only given by your care team. It will not be stored at home. NOTE: This sheet is a  summary. It may not cover all possible information. If you have questions about this medicine, talk to your doctor, pharmacist, or health care provider.  2024 Elsevier/Gold Standard (2021-12-04 00:00:00) Healthy Eating, Adult Healthy eating may help you get and keep a healthy body weight, reduce the risk of chronic disease, and live a long and productive life. It is important to follow a healthy eating pattern. Your nutritional and calorie needs should be met mainly by different nutrient-rich foods. What are tips for following this plan? Reading food labels Read labels and choose the following: Reduced or low sodium products. Juices with 100% fruit juice. Foods with low saturated fats (<3 g per serving) and high polyunsaturated and monounsaturated fats. Foods with whole grains, such as whole wheat, cracked wheat, brown rice, and wild rice. Whole grains that are fortified with folic acid. This is recommended for females who are pregnant or who want to become pregnant. Read labels and do not eat or drink the following: Foods or drinks with added sugars. These include foods that contain brown sugar, corn sweetener, corn syrup, dextrose, fructose, glucose, high-fructose corn syrup, honey, invert sugar, lactose, malt syrup, maltose, molasses, raw sugar, sucrose, trehalose, or turbinado sugar. Limit your intake of added sugars to less than 10% of your total daily calories. Do not eat more than the following amounts of added sugar per day: 6 teaspoons (25 g) for females. 9 teaspoons (38 g) for males. Foods that contain processed or refined starches and grains. Refined grain products, such as white flour, degermed cornmeal, white bread, and white rice. Shopping Choose nutrient-rich snacks, such as vegetables, whole fruits, and nuts. Avoid high-calorie and high-sugar snacks, such as potato chips, fruit snacks, and candy. Use oil-based dressings and spreads on foods instead of solid fats such as butter,  margarine, sour cream, or cream cheese. Limit pre-made sauces, mixes, and "instant" products such as flavored rice, instant noodles, and ready-made pasta. Try more plant-protein sources, such as tofu, tempeh, black beans, edamame, lentils, nuts, and seeds. Explore eating plans such as the Mediterranean diet or vegetarian diet. Try heart-healthy dips made with beans and healthy fats like hummus and guacamole. Vegetables go great with these. Cooking Use oil to saut or stir-fry foods instead of solid fats such as butter, margarine, or lard. Try baking, boiling, grilling, or broiling instead of frying. Remove the fatty part of meats before cooking. Steam vegetables in water or broth. Meal planning  At meals, imagine dividing your plate into fourths: One-half of your plate is fruits and vegetables. One-fourth of your plate is whole grains. One-fourth of your plate is protein, especially lean meats, poultry, eggs, tofu, beans, or nuts. Include low-fat dairy as part of your daily diet. Lifestyle Choose healthy options in all settings, including home, work, school, restaurants, or stores. Prepare your food safely: Wash your hands after handling raw meats. Where you prepare food, keep surfaces clean by regularly washing with hot, soapy water. Keep raw meats separate from ready-to-eat foods, such as fruits and vegetables. Cook seafood, meat, poultry, and eggs to the recommended temperature. Get a food thermometer. Store foods at safe temperatures. In general: Keep cold foods at 31F (4.4C) or below. Keep hot foods at 131F (60C) or above. Keep your freezer at [facility] (-17.8C) or below. Foods are not safe to eat if they have been  between the temperatures of 40-140F (4.4-60C) for more than 2 hours. What foods should I eat? Fruits Aim to eat 1-2 cups of fresh, canned (in natural juice), or frozen fruits each day. One cup of fruit equals 1 small apple, 1 large banana, 8 large strawberries, 1 cup  (237 g) canned fruit,  cup (82 g) dried fruit, or 1 cup (240 mL) 100% juice. Vegetables Aim to eat 2-4 cups of fresh and frozen vegetables each day, including different varieties and colors. One cup of vegetables equals 1 cup (91 g) broccoli or cauliflower florets, 2 medium carrots, 2 cups (150 g) raw, leafy greens, 1 large tomato, 1 large bell pepper, 1 large sweet potato, or 1 medium white potato. Grains Aim to eat 5-10 ounce-equivalents of whole grains each day. Examples of 1 ounce-equivalent of grains include 1 slice of bread, 1 cup (40 g) ready-to-eat cereal, 3 cups (24 g) popcorn, or  cup (93 g) cooked rice. Meats and other proteins Try to eat 5-7 ounce-equivalents of protein each day. Examples of 1 ounce-equivalent of protein include 1 egg,  oz nuts (12 almonds, 24 pistachios, or 7 walnut halves), 1/4 cup (90 g) cooked beans, 6 tablespoons (90 g) hummus or 1 tablespoon (16 g) peanut butter. A cut of meat or fish that is the size of a deck of cards is about 3-4 ounce-equivalents (85 g). Of the protein you eat each week, try to have at least 8 sounce (227 g) of seafood. This is about 2 servings per week. This includes salmon, trout, herring, sardines, and anchovies. Dairy Aim to eat 3 cup-equivalents of fat-free or low-fat dairy each day. Examples of 1 cup-equivalent of dairy include 1 cup (240 mL) milk, 8 ounces (250 g) yogurt, 1 ounces (44 g) natural cheese, or 1 cup (240 mL) fortified soy milk. Fats and oils Aim for about 5 teaspoons (21 g) of fats and oils per day. Choose monounsaturated fats, such as canola and olive oils, mayonnaise made with olive oil or avocado oil, avocados, peanut butter, and most nuts, or polyunsaturated fats, such as sunflower, corn, and soybean oils, walnuts, pine nuts, sesame seeds, sunflower seeds, and flaxseed. Beverages Aim for 6 eight-ounce glasses of water per day. Limit coffee to 3-5 eight-ounce cups per day. Limit caffeinated beverages that have added  calories, such as soda and energy drinks. If you drink alcohol: Limit how much you have to: 0-1 drink a day if you are male. 0-2 drinks a day if you are male. Know how much alcohol is in your drink. In the [place] one drink is one 12 oz bottle of beer (355 mL), one 5 oz glass of wine (148 mL), or one 1 oz glass of hard liquor (44 mL). Seasoning and other foods Try not to add too much salt to your food. Try using herbs and spices instead of salt. Try not to add sugar to food. This information is based on [place] nutrition guidelines. To learn more, visit [employer]. Exact amounts may vary. You may need different amounts. This information is not intended to replace advice given to you by your health care provider. Make sure you discuss any questions you have with your health care provider. Document Revised: 03/25/2022 Document Reviewed: 03/25/2022 Elsevier Patient Education  2024 [employer].
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
## 2023-06-18 LAB — CBC WITH DIFFERENTIAL/PLATELET
Absolute Lymphocytes: 2455 {cells}/uL (ref 850–3900)
Absolute Monocytes: 614 {cells}/uL (ref 200–950)
Basophils Absolute: 59 {cells}/uL (ref 0–200)
Basophils Relative: 0.6 %
Eosinophils Absolute: 188 {cells}/uL (ref 15–500)
Eosinophils Relative: 1.9 %
HCT: 47.7 % (ref 38.5–50.0)
Hemoglobin: 15.4 g/dL (ref 13.2–17.1)
MCH: 28.4 pg (ref 27.0–33.0)
MCHC: 32.3 g/dL (ref 32.0–36.0)
MCV: 87.8 fL (ref 80.0–100.0)
MPV: 12.1 fL (ref 7.5–12.5)
Monocytes Relative: 6.2 %
Neutro Abs: 6584 {cells}/uL (ref 1500–7800)
Neutrophils Relative %: 66.5 %
Platelets: 214 10*3/uL (ref 140–400)
RBC: 5.43 10*6/uL (ref 4.20–5.80)
RDW: 12.7 % (ref 11.0–15.0)
Total Lymphocyte: 24.8 %
WBC: 9.9 10*3/uL (ref 3.8–10.8)

## 2023-06-18 LAB — COMPLETE METABOLIC PANEL WITH GFR
AG Ratio: 1.6 (calc) (ref 1.0–2.5)
ALT: 65 U/L — ABNORMAL HIGH (ref 9–46)
AST: 48 U/L — ABNORMAL HIGH (ref 10–35)
Albumin: 4.5 g/dL (ref 3.6–5.1)
Alkaline phosphatase (APISO): 53 U/L (ref 35–144)
BUN/Creatinine Ratio: 29 (calc) — ABNORMAL HIGH (ref 6–22)
BUN: 20 mg/dL (ref 7–25)
CO2: 27 mmol/L (ref 20–32)
Calcium: 10.1 mg/dL (ref 8.6–10.3)
Chloride: 103 mmol/L (ref 98–110)
Creat: 0.68 mg/dL — ABNORMAL LOW (ref 0.70–1.35)
Globulin: 2.8 g/dL (ref 1.9–3.7)
Glucose, Bld: 149 mg/dL — ABNORMAL HIGH (ref 65–99)
Potassium: 4.7 mmol/L (ref 3.5–5.3)
Sodium: 137 mmol/L (ref 135–146)
Total Bilirubin: 0.5 mg/dL (ref 0.2–1.2)
Total Protein: 7.3 g/dL (ref 6.1–8.1)
eGFR: 104 mL/min/{1.73_m2} (ref >=60)

## 2023-06-18 LAB — URINALYSIS, ROUTINE W REFLEX MICROSCOPIC
Bilirubin Urine: NEGATIVE
Glucose, UA: NEGATIVE
Hgb urine dipstick: NEGATIVE
Ketones, ur: NEGATIVE
Leukocytes,Ua: NEGATIVE
Nitrite: NEGATIVE
Protein, ur: NEGATIVE
Specific Gravity, Urine: 1.029 (ref 1.001–1.035)
pH: 5.5 (ref 5.0–8.0)

## 2023-06-18 LAB — MICROALBUMIN / CREATININE URINE RATIO
Creatinine, Urine: 215 mg/dL (ref 20–320)
Microalb Creat Ratio: 13 mg/g{creat} (ref <30)
Microalb, Ur: 2.8 mg/dL

## 2023-06-18 LAB — HEMOGLOBIN A1C
Hgb A1c MFr Bld: 8.1 %{Hb} — ABNORMAL HIGH (ref <5.7)
Mean Plasma Glucose: 186 mg/dL
eAG (mmol/L): 10.3 mmol/L

## 2023-06-18 LAB — LIPID PANEL
Cholesterol: 146 mg/dL (ref <200)
HDL: 40 mg/dL (ref >=40)
LDL Cholesterol (Calc): 81 mg/dL
Non-HDL Cholesterol (Calc): 106 mg/dL (ref <130)
Total CHOL/HDL Ratio: 3.7 (calc) (ref <5.0)
Triglycerides: 150 mg/dL — ABNORMAL HIGH (ref <150)

## 2023-06-18 LAB — TSH: TSH: 1.8 m[IU]/L (ref 0.40–4.50)

## 2023-06-18 LAB — VITAMIN D 25 HYDROXY (VIT D DEFICIENCY, FRACTURES): Vit D, 25-Hydroxy: 47 ng/mL (ref 30–100)

## 2023-06-18 LAB — PSA: PSA: 3.84 ng/mL (ref <=4.00)

## 2023-06-18 LAB — VITAMIN B12: Vitamin B-12: 907 pg/mL (ref 200–1100)

## 2023-06-18 LAB — INSULIN, RANDOM: Insulin: 31.3 u[IU]/mL — ABNORMAL HIGH

## 2023-06-18 LAB — MAGNESIUM: Magnesium: 1.9 mg/dL (ref 1.5–2.5)

## 2023-06-27 ENCOUNTER — Other Ambulatory Visit: Payer: Self-pay | Admitting: Nurse Practitioner

## 2023-07-04 ENCOUNTER — Other Ambulatory Visit: Payer: Self-pay | Admitting: Nurse Practitioner

## 2023-07-04 DIAGNOSIS — J449 Chronic obstructive pulmonary disease, unspecified: Secondary | ICD-10-CM

## 2023-07-04 MED ORDER — TRELEGY ELLIPTA 100-62.5-25 MCG/ACT IN AEPB: INHALATION_SPRAY | RESPIRATORY_TRACT | 2 refills | Status: DC

## 2023-08-09 ENCOUNTER — Other Ambulatory Visit: Payer: Self-pay | Admitting: Nurse Practitioner

## 2023-08-09 DIAGNOSIS — E782 Mixed hyperlipidemia: Secondary | ICD-10-CM

## 2023-09-02 ENCOUNTER — Other Ambulatory Visit: Payer: Self-pay

## 2023-09-02 DIAGNOSIS — Z79899 Other long term (current) drug therapy: Secondary | ICD-10-CM

## 2023-09-02 DIAGNOSIS — G546 Phantom limb syndrome with pain: Secondary | ICD-10-CM

## 2023-09-02 MED ORDER — CYCLOBENZAPRINE HCL 5 MG PO TABS: ORAL_TABLET | ORAL | 0 refills | Status: DC

## 2023-09-18 ENCOUNTER — Ambulatory Visit: Payer: Medicare Other | Admitting: Internal Medicine

## 2023-10-16 DIAGNOSIS — E119 Type 2 diabetes mellitus without complications: Secondary | ICD-10-CM | POA: Diagnosis not present

## 2023-10-16 DIAGNOSIS — H524 Presbyopia: Secondary | ICD-10-CM | POA: Diagnosis not present

## 2023-10-16 DIAGNOSIS — H5203 Hypermetropia, bilateral: Secondary | ICD-10-CM | POA: Diagnosis not present

## 2023-10-16 LAB — HM DIABETES EYE EXAM

## 2023-10-27 DIAGNOSIS — L821 Other seborrheic keratosis: Secondary | ICD-10-CM | POA: Diagnosis not present

## 2023-10-27 DIAGNOSIS — D225 Melanocytic nevi of trunk: Secondary | ICD-10-CM | POA: Diagnosis not present

## 2023-10-27 DIAGNOSIS — Z85828 Personal history of other malignant neoplasm of skin: Secondary | ICD-10-CM | POA: Diagnosis not present

## 2023-10-27 DIAGNOSIS — D1801 Hemangioma of skin and subcutaneous tissue: Secondary | ICD-10-CM | POA: Diagnosis not present

## 2023-10-27 DIAGNOSIS — L814 Other melanin hyperpigmentation: Secondary | ICD-10-CM | POA: Diagnosis not present

## 2023-11-13 ENCOUNTER — Telehealth: Payer: Self-pay

## 2023-11-13 ENCOUNTER — Other Ambulatory Visit (HOSPITAL_COMMUNITY): Payer: Self-pay

## 2023-11-13 ENCOUNTER — Encounter: Payer: Self-pay | Admitting: Nurse Practitioner

## 2023-11-13 ENCOUNTER — Ambulatory Visit: Admitting: Nurse Practitioner

## 2023-11-13 VITALS — BP 132/82 | HR 53 | Temp 97.8°F | Ht 70.5 in | Wt 243.2 lb

## 2023-11-13 DIAGNOSIS — I10 Essential (primary) hypertension: Secondary | ICD-10-CM | POA: Diagnosis not present

## 2023-11-13 DIAGNOSIS — Z7984 Long term (current) use of oral hypoglycemic drugs: Secondary | ICD-10-CM

## 2023-11-13 DIAGNOSIS — E1169 Type 2 diabetes mellitus with other specified complication: Secondary | ICD-10-CM

## 2023-11-13 DIAGNOSIS — E1165 Type 2 diabetes mellitus with hyperglycemia: Secondary | ICD-10-CM | POA: Diagnosis not present

## 2023-11-13 DIAGNOSIS — K219 Gastro-esophageal reflux disease without esophagitis: Secondary | ICD-10-CM | POA: Diagnosis not present

## 2023-11-13 DIAGNOSIS — J449 Chronic obstructive pulmonary disease, unspecified: Secondary | ICD-10-CM

## 2023-11-13 DIAGNOSIS — I700 Atherosclerosis of aorta: Secondary | ICD-10-CM | POA: Diagnosis not present

## 2023-11-13 DIAGNOSIS — F325 Major depressive disorder, single episode, in full remission: Secondary | ICD-10-CM

## 2023-11-13 DIAGNOSIS — Z7985 Long-term (current) use of injectable non-insulin antidiabetic drugs: Secondary | ICD-10-CM

## 2023-11-13 DIAGNOSIS — E785 Hyperlipidemia, unspecified: Secondary | ICD-10-CM

## 2023-11-13 DIAGNOSIS — E782 Mixed hyperlipidemia: Secondary | ICD-10-CM

## 2023-11-13 LAB — CBC
HCT: 45.7 % (ref 39.0–52.0)
Hemoglobin: 15.1 g/dL (ref 13.0–17.0)
MCHC: 33.1 g/dL (ref 30.0–36.0)
MCV: 86.3 fl (ref 78.0–100.0)
Platelets: 165 10*3/uL (ref 150.0–400.0)
RBC: 5.3 Mil/uL (ref 4.22–5.81)
RDW: 14.2 % (ref 11.5–15.5)
WBC: 8.6 10*3/uL (ref 4.0–10.5)

## 2023-11-13 LAB — COMPREHENSIVE METABOLIC PANEL WITH GFR
ALT: 73 U/L — ABNORMAL HIGH (ref 0–53)
AST: 54 U/L — ABNORMAL HIGH (ref 0–37)
Albumin: 4.6 g/dL (ref 3.5–5.2)
Alkaline Phosphatase: 53 U/L (ref 39–117)
BUN: 17 mg/dL (ref 6–23)
CO2: 27 meq/L (ref 19–32)
Calcium: 9.9 mg/dL (ref 8.4–10.5)
Chloride: 100 meq/L (ref 96–112)
Creatinine, Ser: 0.77 mg/dL (ref 0.40–1.50)
GFR: 94.88 mL/min (ref >60.00)
Glucose, Bld: 114 mg/dL — ABNORMAL HIGH (ref 70–99)
Potassium: 4.2 meq/L (ref 3.5–5.1)
Sodium: 138 meq/L (ref 135–145)
Total Bilirubin: 0.6 mg/dL (ref 0.2–1.2)
Total Protein: 7.1 g/dL (ref 6.0–8.3)

## 2023-11-13 LAB — LIPID PANEL
Cholesterol: 284 mg/dL — ABNORMAL HIGH (ref 0–200)
HDL: 41.3 mg/dL (ref >39.00)
LDL Cholesterol: 201 mg/dL — ABNORMAL HIGH (ref 0–99)
NonHDL: 242.21
Total CHOL/HDL Ratio: 7
Triglycerides: 205 mg/dL — ABNORMAL HIGH (ref 0.0–149.0)
VLDL: 41 mg/dL — ABNORMAL HIGH (ref 0.0–40.0)

## 2023-11-13 LAB — TSH: TSH: 1.88 u[IU]/mL (ref 0.35–5.50)

## 2023-11-13 LAB — POCT GLYCOSYLATED HEMOGLOBIN (HGB A1C): Hemoglobin A1C: 7.9 % — AB (ref 4.0–5.6)

## 2023-11-13 MED ORDER — OZEMPIC (0.25 OR 0.5 MG/DOSE) 2 MG/3ML ~~LOC~~ SOPN: 0.2500 mg | PEN_INJECTOR | SUBCUTANEOUS | 0 refills | Status: DC

## 2023-11-13 MED ORDER — OLMESARTAN MEDOXOMIL 40 MG PO TABS
ORAL_TABLET | ORAL | 3 refills | Status: AC
Start: 2023-11-13 — End: ?

## 2023-11-13 MED ORDER — ROSUVASTATIN CALCIUM 20 MG PO TABS: 20.0000 mg | ORAL_TABLET | Freq: Every day | ORAL | 3 refills | Status: AC

## 2023-11-13 NOTE — Assessment & Plan Note (Signed)
 Patient currently maintained on Trelegy daily and albuterol  as needed.  Stable at this juncture continue medication as prescribed

## 2023-11-13 NOTE — Patient Instructions (Signed)
 Nice to see you today I will be in touch with the labs once I have reviewed them Follow up with me in 3 month  We are starting ozempic. This is once a week injection. Once you get the medication you can call up here and get a nurse visit and they can teach you how to do it at home

## 2023-11-13 NOTE — Assessment & Plan Note (Signed)
 History of the same.  Patient currently on Nexium  40 mg daily symptoms controlled continue

## 2023-11-13 NOTE — Telephone Encounter (Signed)
 Pharmacy Patient Advocate Encounter   Received notification from CoverMyMeds that prior authorization for Ozempic (0.25 or 0.5 MG/DOSE) 2MG /3ML pen-injectors is required/requested.   Insurance verification completed.   The patient is insured through [facility] ADV[employer]/RX ADVANCE .   Per test claim: PA required; PA submitted to above mentioned insurance via CoverMyMeds Key/confirmation #/EOC [id number] Status is pending

## 2023-11-13 NOTE — Assessment & Plan Note (Signed)
 Currently maintained on Celexa  20 mg daily.  He denies HI/SI/AVH.  Continue medications as prescribed
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
## 2023-11-13 NOTE — Assessment & Plan Note (Signed)
 Patient currently maintained on Crestor  20 mg daily and Zetia  10 mg daily pending lipid panel continue both medications as prescribed

## 2023-11-13 NOTE — Telephone Encounter (Signed)
 Pharmacy Patient Advocate Encounter  Received notification from [facility] [employer] that Prior Authorization for Ozempic (0.25 or 0.5 MG/DOSE) 2MG /3ML pen-injectors has been APPROVED from 11/13/2023 to 11/12/2024. Ran test claim, Copay is $47. This test claim was processed through [facility]- copay amounts may vary at other pharmacies due to pharmacy/plan contracts, or as the patient moves through the different stages of their insurance plan.   PA #/Case ID/Reference #: [id number]

## 2023-11-13 NOTE — Assessment & Plan Note (Signed)
 Patient currently maintained on home losartan 40 mg and metoprolol  50 mg daily.  Blood pressure well-controlled.  Continue medication as prescribed

## 2023-11-13 NOTE — Assessment & Plan Note (Signed)
 History of the same with uncontrolled A1c.  Patient currently maintained on metformin  1000 mg twice daily will add Ozempic 0.25 mg weekly for 4 weeks and then titrate up to Ozempic 0.5 mg weekly thereafter he will stay there until following up with me in office

## 2023-11-13 NOTE — Assessment & Plan Note (Signed)
 History of the same.  Patient currently maintained on Zetia  and Crestor  20 mg daily.  Continue

## 2023-11-14 ENCOUNTER — Encounter: Payer: Self-pay | Admitting: Nurse Practitioner

## 2023-11-17 ENCOUNTER — Encounter: Payer: Self-pay | Admitting: Nurse Practitioner

## 2023-11-17 ENCOUNTER — Other Ambulatory Visit: Payer: Self-pay | Admitting: Nurse Practitioner

## 2023-11-17 DIAGNOSIS — E1169 Type 2 diabetes mellitus with other specified complication: Secondary | ICD-10-CM

## 2023-11-18 ENCOUNTER — Ambulatory Visit: Payer: Self-pay

## 2023-11-18 ENCOUNTER — Other Ambulatory Visit (HOSPITAL_COMMUNITY): Payer: Self-pay

## 2023-11-18 NOTE — Telephone Encounter (Signed)
 PA was recently approved, called pharmacy, they are getting medication ready for pt.

## 2023-11-22 ENCOUNTER — Encounter: Payer: Self-pay | Admitting: Nurse Practitioner

## 2023-11-22 DIAGNOSIS — E1165 Type 2 diabetes mellitus with hyperglycemia: Secondary | ICD-10-CM

## 2023-11-22 DIAGNOSIS — E782 Mixed hyperlipidemia: Secondary | ICD-10-CM

## 2023-11-24 MED ORDER — EZETIMIBE 10 MG PO TABS: 10.0000 mg | ORAL_TABLET | Freq: Every day | ORAL | 1 refills | Status: DC

## 2023-11-27 NOTE — Addendum Note (Signed)
 Addended by: Dorothe Gaster on: 11/27/2023 01:42 PM   Modules accepted: Orders

## 2023-11-27 NOTE — Telephone Encounter (Signed)
 See my chart about getting PAP for ozempic 

## 2023-11-28 ENCOUNTER — Other Ambulatory Visit (INDEPENDENT_AMBULATORY_CARE_PROVIDER_SITE_OTHER): Payer: Self-pay | Admitting: Pharmacist

## 2023-11-28 DIAGNOSIS — E1165 Type 2 diabetes mellitus with hyperglycemia: Secondary | ICD-10-CM

## 2023-11-28 NOTE — Progress Notes (Signed)
 Brief Telephone Documentation Reason for Call: Medication Access/Cost  Summary of Call: Called and spoke with patient's wife regarding Ozempic  Patient Assistance Program criteria.  Per request, program information has been sent via MyChart.  They will review the information this weekend and let us  know if they feel they would be eligible.   Follow Up: Patient given direct line to discuss program enrollment/program questions   Daron Ellen, PharmD Clinical Pharmacist [facility] [phone]

## 2023-12-02 ENCOUNTER — Telehealth: Payer: Self-pay

## 2023-12-02 NOTE — Progress Notes (Signed)
 Care Guide Pharmacy Note  12/02/2023 Name: [name] MRN: [id number] DOB: Jan 05, 1961  Referred By: Dorothe Gaster, NP Reason for referral: Complex Care Management (Outreach to schedule with Pharm d )   [name] is a 63 y.o. year old male who is a primary care patient of Dorothe Gaster, NP.  [name] was referred to the pharmacist for assistance related to: DMII  An unsuccessful telephone outreach was attempted today to contact the patient who was referred to the pharmacy team for assistance with medication assistance. Additional attempts will be made to contact the patient.  [name] , RMA     [facility]  [facility], [facility] Guide  Direct Dial: [phone]  Website: [email or web address]

## 2023-12-02 NOTE — Progress Notes (Signed)
 Care Guide Pharmacy Note  12/02/2023 Name: [name] MRN: [id number] DOB: Nov 01, 1960  Referred By: Dorothe Gaster, NP Reason for referral: Complex Care Management (Outreach to schedule with Pharm d )   [name] is a 63 y.o. year old male who is a primary care patient of Dorothe Gaster, NP.  [name] was referred to the pharmacist for assistance related to: DMII  Successful contact was made with the patient to discuss pharmacy services including being ready for the pharmacist to call at least 5 minutes before the scheduled appointment time and to have medication bottles and any blood pressure readings ready for review. The patient agreed to meet with the pharmacist via telephone visit on (date/time).12/04/2023  Lenton Rail , RMA     [place]  [facility] Guide  Direct Dial: [phone]  Website: [email or web address]

## 2023-12-04 ENCOUNTER — Encounter: Payer: Self-pay | Admitting: Pharmacist

## 2023-12-04 ENCOUNTER — Other Ambulatory Visit

## 2023-12-04 NOTE — Progress Notes (Addendum)
 Brief Telephone Documentation Reason for Call: Follow up on Patient Assistance  Summary of Call: Spoke with patient's wife last week regarding criteria for patient assistance.  Called today to follow up regarding application for Ozempic .    Wife returned call. Notes that at this time they are leaning toward preference for lifestyle modification over starting a new medication. She reports that in the past, each time his A1c has increased, they have been able to get it down quickly with diet/activity changes.   Wife notes they have already implemented significant dietary changes. She has kept chopped vegetables ready in the fridge for snacks, and has stopped purchasing sweets/junk food. Also notes that patient has been significantly more active, almost daily doing yard work/gardening.  Spoke with patient separately and reviewed option for free medicine through PAP. He is in agreement with wife, and is open to reconsideration if A1c does not come down to goal at recheck.     Note forwarded to PCP.  Repeat A1c scheduled for August.  Future Appointments  Date Time Provider Department Center  02/17/2024  8:00 AM Dorothe Gaster, NP [facility]  02/18/2024  7:30 AM [facility]   Follow Up: Patient given direct line for further questions/concerns.  Daron Ellen, PharmD Clinical Pharmacist [facility] [phone]

## 2023-12-06 ENCOUNTER — Encounter: Payer: Self-pay | Admitting: Nurse Practitioner

## 2023-12-08 ENCOUNTER — Telehealth: Payer: Self-pay

## 2023-12-08 DIAGNOSIS — Z89612 Acquired absence of left leg above knee: Secondary | ICD-10-CM

## 2023-12-08 NOTE — Telephone Encounter (Signed)
 Faxed prescription to fax number provided: [phone]

## 2023-12-08 NOTE — Addendum Note (Signed)
 Addended by: Dorothe Gaster on: 12/08/2023 01:20 PM   Modules accepted: Orders

## 2023-12-08 NOTE — Telephone Encounter (Signed)
 Copied from CRM [id number]. Topic: General - Other >> Dec 08, 2023  9:36 AM Jenice Mitts wrote: Reason for CRM: Verle Gleason from [facility] is calling to let Dr. Tivis Forster know that the patients needs a prescription for replacement prosthetic liners and the prescription can be faxed to him at [phone] and if anymore questions need to be answered he can be reached at [phone]

## 2023-12-08 NOTE — Telephone Encounter (Signed)
 Prescription printed and placed in outgoing MA box

## 2023-12-08 NOTE — Progress Notes (Signed)
 Complex Care Management Note Care Guide Note  12/08/2023 Name: [name] MRN: [id number] DOB: Aug 06, 1960   Complex Care Management Outreach Attempts: An unsuccessful telephone outreach was attempted today to offer the patient information about available complex care management services.  Follow Up Plan:  Additional outreach attempts will be made to offer the patient complex care management information and services.   Encounter Outcome:  No Answer  [facility]  [facility], [facility] Management Assistant Direct Dial: [phone]  Fax: [phone]

## 2023-12-16 NOTE — Progress Notes (Signed)
 Complex Care Management Care Guide Note  12/16/2023 Name: [name] MRN: [id number] DOB: 1961-01-19  [name] is a 63 y.o. year old male who is a primary care patient of Dorothe Gaster, NP and is actively engaged with the care management team. I reached out to [name] by phone today to assist with re-scheduling  with the Pharmacist.  Follow up plan: Per CHL, wife called back and spoke with [name], [facility]. Will close as visit completed.  [facility]  [facility], [facility] Care Management Assistant Direct Dial: [phone]  Fax: [phone]

## 2024-01-05 ENCOUNTER — Telehealth: Payer: Self-pay

## 2024-01-05 DIAGNOSIS — Z89512 Acquired absence of left leg below knee: Secondary | ICD-10-CM | POA: Diagnosis not present

## 2024-01-05 NOTE — Telephone Encounter (Signed)
 Patient was written a year supply and should have a refill on file at the pharmacy

## 2024-01-05 NOTE — Telephone Encounter (Signed)
 LAST APPOINTMENT DATE: 11/13/2023    NEXT APPOINTMENT DATE: 02/17/2024  Metoprolol  Tartrate 50 MG   LAST REFILL: 04/14/2023   QTY:[id number] #[id number]

## 2024-01-10 ENCOUNTER — Encounter: Payer: Self-pay | Admitting: Nurse Practitioner

## 2024-02-10 ENCOUNTER — Ambulatory Visit

## 2024-02-11 ENCOUNTER — Ambulatory Visit (INDEPENDENT_AMBULATORY_CARE_PROVIDER_SITE_OTHER)

## 2024-02-11 VITALS — Ht 71.0 in | Wt 238.0 lb

## 2024-02-11 DIAGNOSIS — Z122 Encounter for screening for malignant neoplasm of respiratory organs: Secondary | ICD-10-CM | POA: Diagnosis not present

## 2024-02-11 DIAGNOSIS — Z0000 Encounter for general adult medical examination without abnormal findings: Secondary | ICD-10-CM | POA: Diagnosis not present

## 2024-02-11 DIAGNOSIS — Z1211 Encounter for screening for malignant neoplasm of colon: Secondary | ICD-10-CM | POA: Diagnosis not present

## 2024-02-11 NOTE — Patient Instructions (Signed)
 Mr. Duve , Thank you for taking time out of your busy schedule to complete your Annual Wellness Visit with me. I enjoyed our conversation and look forward to speaking with you again next year. I, as well as your care team,  appreciate your ongoing commitment to your health goals. Please review the following plan we discussed and let me know if I can assist you in the future. Your Game plan/ To Do List    Referrals: If you haven't heard from the office you've been referred to, please reach out to them at the phone provided.  [facility], [address] [place] [address], [place], [place] [address], [phone].  Follow up Visits: We will see or speak with you next year for your Next Medicare AWV with our clinical staff Have you seen your provider in the last 6 months (3 months if uncontrolled diabetes)? Yes  Clinician Recommendations:  Aim for 30 minutes of exercise or brisk walking, 6-8 glasses of water , and 5 servings of fruits and vegetables each day. You are due for a pneumonia vaccine and can get that done during your next office visit.  You are also due a foot exam and will have that done at your next office visit as well.        This is a list of the screenings recommended for you:  Health Maintenance  Topic Date Due   Complete foot exam   Never done   Zoster (Shingles) Vaccine (1 of 2) Never done   Pneumococcal Vaccine for high risk medical condition (2 of 2 - PCV) 04/07/2017   Pneumococcal Vaccine for age over 67 (2 of 2 - PCV) 04/07/2017   Eye exam for diabetics  08/09/2017   COVID-19 Vaccine (4 - 2024-25 season) 03/09/2023   Screening for Lung Cancer  10/15/2023   Colon Cancer Screening  11/03/2023   Flu Shot  02/06/2024   Medicare Annual Wellness Visit  03/02/2024   Hemoglobin A1C  05/15/2024   Yearly kidney health urinalysis for diabetes  06/16/2024   Yearly kidney function blood test for diabetes  11/12/2024   DTaP/Tdap/Td vaccine (3 - Td or Tdap) 04/05/2026    Hepatitis C Screening  Completed   Hepatitis B Vaccine  Aged Out   HPV Vaccine  Aged Out   Meningitis B Vaccine  Aged Out   HIV Screening  Discontinued    Advanced directives: (Declined) Advance directive discussed with you today. Even though you declined this today, please call our office should you change your mind, and we can give you the proper paperwork for you to fill out. Advance Care Planning is important because it:  [x]  Makes sure you receive the medical care that is consistent with your values, goals, and preferences  [x]  It provides guidance to your family and loved ones and reduces their decisional burden about whether or not they are making the right decisions based on your wishes.  Follow the link provided in your after visit summary or read over the paperwork we have mailed to you to help you started getting your Advance Directives in place. If you need assistance in completing these, please reach out to us  so that we can help you!  See attachments for Preventive Care and Fall Prevention Tips.
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
## 2024-02-12 ENCOUNTER — Encounter: Payer: Self-pay | Admitting: Gastroenterology

## 2024-02-17 ENCOUNTER — Ambulatory Visit (INDEPENDENT_AMBULATORY_CARE_PROVIDER_SITE_OTHER): Admitting: Nurse Practitioner

## 2024-02-17 VITALS — BP 116/60 | HR 62 | Temp 98.2°F | Ht 71.0 in | Wt 238.0 lb

## 2024-02-17 DIAGNOSIS — E785 Hyperlipidemia, unspecified: Secondary | ICD-10-CM

## 2024-02-17 DIAGNOSIS — Z7984 Long term (current) use of oral hypoglycemic drugs: Secondary | ICD-10-CM

## 2024-02-17 DIAGNOSIS — K219 Gastro-esophageal reflux disease without esophagitis: Secondary | ICD-10-CM

## 2024-02-17 DIAGNOSIS — Z7985 Long-term (current) use of injectable non-insulin antidiabetic drugs: Secondary | ICD-10-CM | POA: Diagnosis not present

## 2024-02-17 DIAGNOSIS — E1165 Type 2 diabetes mellitus with hyperglycemia: Secondary | ICD-10-CM | POA: Diagnosis not present

## 2024-02-17 DIAGNOSIS — E1169 Type 2 diabetes mellitus with other specified complication: Secondary | ICD-10-CM

## 2024-02-17 LAB — LIPID PANEL
Cholesterol: 136 mg/dL (ref 0–200)
HDL: 38.3 mg/dL — ABNORMAL LOW (ref >39.00)
LDL Cholesterol: 67 mg/dL (ref 0–99)
NonHDL: 97.34
Total CHOL/HDL Ratio: 4
Triglycerides: 153 mg/dL — ABNORMAL HIGH (ref 0.0–149.0)
VLDL: 30.6 mg/dL (ref 0.0–40.0)

## 2024-02-17 LAB — HEPATIC FUNCTION PANEL
ALT: 32 U/L (ref 0–53)
AST: 26 U/L (ref 0–37)
Albumin: 4.5 g/dL (ref 3.5–5.2)
Alkaline Phosphatase: 45 U/L (ref 39–117)
Bilirubin, Direct: 0.1 mg/dL (ref 0.0–0.3)
Total Bilirubin: 0.3 mg/dL (ref 0.2–1.2)
Total Protein: 7.1 g/dL (ref 6.0–8.3)

## 2024-02-17 LAB — POCT GLYCOSYLATED HEMOGLOBIN (HGB A1C): Hemoglobin A1C: 7.2 % — AB (ref 4.0–5.6)

## 2024-02-17 MED ORDER — LANCETS MISC. MISC: 1.0000 | Freq: Every day | 0 refills | Status: AC

## 2024-02-17 MED ORDER — BLOOD GLUCOSE MONITORING SUPPL DEVI: 1.0000 | Freq: Every day | 0 refills | Status: AC

## 2024-02-17 MED ORDER — BLOOD GLUCOSE TEST VI STRP: 1.0000 | ORAL_STRIP | Freq: Every day | 0 refills | Status: AC

## 2024-02-17 MED ORDER — LANCET DEVICE MISC: 1.0000 | Freq: Every day | 0 refills | Status: AC

## 2024-02-17 MED ORDER — ESOMEPRAZOLE MAGNESIUM 40 MG PO CPDR: DELAYED_RELEASE_CAPSULE | ORAL | 3 refills | Status: AC

## 2024-02-17 NOTE — Assessment & Plan Note (Signed)
 Currently maintained on metformin  1000 mg twice daily.  Patient is A1c trended down from 7.9 to 7.2%. He is working on lifestyle modifications.  Patient is also approximately 5 pounds.  Did not pick up Ozempic  as he is waiting to see if lifestyle modifications would be obtainable and beneficial.  Continue metformin  as prescribed

## 2024-02-17 NOTE — Progress Notes (Signed)
 Established Patient Office Visit  Subjective   Patient ID: [name], male    DOB: [birth year]  Age: 63 y.o. MRN: [id number]  Chief Complaint  Patient presents with   Diabetes   Medication Management    Esomeprazole  DR 40mg . Provider office is closed and pt is not able to get any more refills. Requests for Pcp to fill script.    HPI 7.2  DM2: Patient currently maintained on metformin  1000 mg twice daily.  He was prescribed Ozempic  0.25 mg weekly which required a PA.  PA was done and approved.  Medication was cost prohibitive.  Recently Her pharmacy for for PAP.  Patient is here for follow-up Does not have sugar supplies at home States that he has been working his diet and lost  He has been cuttin gout snacks between meals He will do water  and sometimes milk with a pepsi free  He was not able to see      Review of Systems  Constitutional:  Negative for chills and fever.  Respiratory:  Negative for shortness of breath.   Cardiovascular:  Negative for chest pain.  Gastrointestinal:        BM daily   Neurological:  Negative for dizziness and headaches.      Objective:     BP 116/60   Pulse 62   Temp 98.2 F (36.8 C) (Oral)   Ht 5' 11 (1.803 m)   Wt 238 lb (108 kg)   SpO2 95%   BMI 33.19 kg/m  BP Readings from Last 3 Encounters:  02/17/24 116/60  11/13/23 132/82  06/17/23 128/76   Wt Readings from Last 3 Encounters:  02/17/24 238 lb (108 kg)  02/11/24 238 lb (108 kg)  11/13/23 243 lb 3.2 oz (110.3 kg)   SpO2 Readings from Last 3 Encounters:  02/17/24 95%  11/13/23 92%  06/17/23 98%      Physical Exam Vitals and nursing note reviewed.  Constitutional:      Appearance: Normal appearance.  Cardiovascular:     Rate and Rhythm: Normal rate and regular rhythm.     Heart sounds: Normal heart sounds.  Pulmonary:     Effort: Pulmonary effort is normal.     Breath sounds: Normal breath sounds.  Abdominal:     General: Bowel sounds are normal.   Neurological:     Mental Status: He is alert.      Results for orders placed or performed in visit on 02/17/24  POCT glycosylated hemoglobin (Hb A1C)  Result Value Ref Range   Hemoglobin A1C 7.2 (A) 4.0 - 5.6 %   HbA1c POC (<> result, manual entry)     HbA1c, POC (prediabetic range)     HbA1c, POC (controlled diabetic range)        The 10-year ASCVD risk score (Arnett DK, et al., 2019) is: 29.8%    Assessment & Plan:   Problem List Items Addressed This Visit       Digestive   GERD   Relevant Medications   esomeprazole  (NEXIUM ) 40 MG capsule     Endocrine   Hyperlipidemia associated with type 2 diabetes mellitus (HCC)   Currently maintained on rosuvastatin  40 mg daily and ezetimibe  10 mg daily.  Pending liver and lipid panel today      Relevant Orders   Lipid panel   Hepatic function panel   Type 2 diabetes mellitus with hyperglycemia, without long-term current use of insulin  (HCC) - Primary   Currently maintained on metformin   1000 mg twice daily.  Patient is A1c trended down from 7.9 to 7.2%. He is working on lifestyle modifications.  Patient is also approximately 5 pounds.  Did not pick up Ozempic  as he is waiting to see if lifestyle modifications would be obtainable and beneficial.  Continue metformin  as prescribed      Relevant Medications   Blood Glucose Monitoring Suppl DEVI   Glucose Blood (BLOOD GLUCOSE TEST STRIPS) STRP   Lancet Device MISC   Lancets Misc. MISC   Other Relevant Orders   POCT glycosylated hemoglobin (Hb A1C) (Completed)    Return in about 3 months (around 05/19/2024) for DM recheck.    Adina Crandall, NP

## 2024-02-17 NOTE — Assessment & Plan Note (Signed)
 Currently maintained on rosuvastatin  40 mg daily and ezetimibe  10 mg daily.  Pending liver and lipid panel today

## 2024-02-17 NOTE — Patient Instructions (Addendum)
 Nice to see you today I will be in touch with the labs once I have the results Follow up with me in 3 months, sooner if you need me  Your A1C has come down to 7.2%  If you want to check your glucose daily first thing in the morning that would be ideal. If not at least a couple times a week

## 2024-02-18 ENCOUNTER — Other Ambulatory Visit

## 2024-02-19 ENCOUNTER — Ambulatory Visit: Payer: Self-pay | Admitting: Nurse Practitioner

## 2024-03-01 ENCOUNTER — Ambulatory Visit: Payer: Medicare Other | Admitting: Nurse Practitioner

## 2024-03-02 ENCOUNTER — Ambulatory Visit: Payer: Medicare Other | Admitting: Nurse Practitioner

## 2024-03-11 ENCOUNTER — Encounter: Payer: Self-pay | Admitting: Gastroenterology

## 2024-03-11 ENCOUNTER — Ambulatory Visit (AMBULATORY_SURGERY_CENTER): Admitting: *Deleted

## 2024-03-11 VITALS — Ht 71.0 in | Wt 240.0 lb

## 2024-03-11 DIAGNOSIS — Z860100 Personal history of colon polyps, unspecified: Secondary | ICD-10-CM

## 2024-03-11 MED ORDER — PEG 3350-KCL-NA BICARB-NACL 420 G PO SOLR: 4000.0000 mL | Freq: Once | ORAL | 0 refills | Status: AC

## 2024-03-11 NOTE — Progress Notes (Signed)
 Pt's name and DOB verified at the beginning of the pre-visit with 2 identifiers   Pt denies any difficulty with ambulating,sitting, laying down or rolling side to side  Pt has no issues moving head neck or swallowing  No egg or soy allergy known to patient   No issues known to pt with past sedation  No FH of Malignant Hyperthermia  Pt is not on home 02   Pt is not on blood thinners   Pt has frequent issues with constipation RN instructed pt to use Miralax  per bottles instructions a week before prep days. Pt states they will  Pt is not on dialysis  Pt denise any abnormal heart rhythms   Pt denies any upcoming cardiac testing  Patient's chart reviewed by Norleen Schillings CNRA prior to pre-visit and patient appropriate for the [facility].  Pre-visit completed and red dot placed by patient's name on their procedure day (on provider's schedule).    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 240 lb Informed pt to come in at the time discussed and is shown on PV instructions.  Pt instructed to use [employer] or GoodRx for a price reduction on prep  Instructed pt where to find PV instructions in My Chart  Instructed pt on all aspects of written instructions including med holds clothing to wear and foods to eat and not eat as well as after procedure legal restrictions and to call MD on call if needed.. Pt states understanding. Instructed pt to review instructions again prior to procedure and call main # given if has any questions or any issues. Pt states they will.

## 2024-03-22 ENCOUNTER — Encounter (HOSPITAL_COMMUNITY): Payer: Self-pay

## 2024-03-22 ENCOUNTER — Emergency Department (HOSPITAL_COMMUNITY)

## 2024-03-22 ENCOUNTER — Emergency Department (HOSPITAL_COMMUNITY)
Admission: EM | Admit: 2024-03-22 | Discharge: 2024-03-22 | Disposition: A | Discharge status: Home / Self Care | Attending: Emergency Medicine | Admitting: Emergency Medicine

## 2024-03-22 ENCOUNTER — Other Ambulatory Visit: Payer: Self-pay

## 2024-03-22 DIAGNOSIS — W268XXA Contact with other sharp object(s), not elsewhere classified, initial encounter: Secondary | ICD-10-CM | POA: Insufficient documentation

## 2024-03-22 DIAGNOSIS — S82401A Unspecified fracture of shaft of right fibula, initial encounter for closed fracture: Secondary | ICD-10-CM | POA: Diagnosis not present

## 2024-03-22 DIAGNOSIS — S81811A Laceration without foreign body, right lower leg, initial encounter: Secondary | ICD-10-CM | POA: Insufficient documentation

## 2024-03-22 DIAGNOSIS — Z7982 Long term (current) use of aspirin: Secondary | ICD-10-CM | POA: Insufficient documentation

## 2024-03-22 LAB — I-STAT CHEM 8, ED
BUN: 25 mg/dL — ABNORMAL HIGH (ref 8–23)
Calcium, Ion: 1.1 mmol/L — ABNORMAL LOW (ref 1.15–1.40)
Chloride: 104 mmol/L (ref 98–111)
Creatinine, Ser: 0.9 mg/dL (ref 0.61–1.24)
Glucose, Bld: 127 mg/dL — ABNORMAL HIGH (ref 70–99)
HCT: 44 % (ref 39.0–52.0)
Hemoglobin: 15 g/dL (ref 13.0–17.0)
Potassium: 4 mmol/L (ref 3.5–5.1)
Sodium: 139 mmol/L (ref 135–145)
TCO2: 25 mmol/L (ref 22–32)

## 2024-03-22 LAB — CBC
HCT: 47.3 % (ref 39.0–52.0)
Hemoglobin: 15.2 g/dL (ref 13.0–17.0)
MCH: 28.4 pg (ref 26.0–34.0)
MCHC: 32.1 g/dL (ref 30.0–36.0)
MCV: 88.2 fL (ref 80.0–100.0)
Platelets: 198 K/uL (ref 150–400)
RBC: 5.36 MIL/uL (ref 4.22–5.81)
RDW: 13.3 % (ref 11.5–15.5)
WBC: 10.3 K/uL (ref 4.0–10.5)
nRBC: 0 % (ref 0.0–0.2)

## 2024-03-22 LAB — COMPREHENSIVE METABOLIC PANEL WITH GFR
ALT: 31 U/L (ref 0–44)
AST: 31 U/L (ref 15–41)
Albumin: 4.1 g/dL (ref 3.5–5.0)
Alkaline Phosphatase: 41 U/L (ref 38–126)
Anion gap: 11 (ref 5–15)
BUN: 21 mg/dL (ref 8–23)
CO2: 24 mmol/L (ref 22–32)
Calcium: 9.4 mg/dL (ref 8.9–10.3)
Chloride: 104 mmol/L (ref 98–111)
Creatinine, Ser: 0.73 mg/dL (ref 0.61–1.24)
GFR, Estimated: >60 mL/min (ref >60)
Glucose, Bld: 125 mg/dL — ABNORMAL HIGH (ref 70–99)
Potassium: 4 mmol/L (ref 3.5–5.1)
Sodium: 139 mmol/L (ref 135–145)
Total Bilirubin: 0.2 mg/dL (ref 0.0–1.2)
Total Protein: 7.2 g/dL (ref 6.5–8.1)

## 2024-03-22 MED ORDER — LIDOCAINE HCL 2 % IJ SOLN
5.0000 mL | Freq: Once | INTRAMUSCULAR | Status: AC
  Administered 2024-03-22: 100 mg
  Filled 2024-03-22: qty 20

## 2024-03-22 NOTE — Discharge Instructions (Signed)
 The stitches can come out in around a week to 10 days.

## 2024-03-22 NOTE — ED Triage Notes (Signed)
 Pt reports a piece a sheet metal cut his leg today around 10:30 and placed a bandage on it. The bleeding worsened around 3pm. Quick clot applied in triage to control the bleeding and was then wrapped with kerlex gauze and wrapped with ace wrap. 1in laceration to right anterior shin. Denies blood thinners. + sensation and he is able to wiggle his toes. + pedal pulse.

## 2024-03-22 NOTE — ED Provider Notes (Signed)
 [place] EMERGENCY DEPARTMENT AT [facility] Provider Note   CSN: [id number] Arrival date & time: 03/22/24  1552     Patient presents with: Laceration   [name] is a 63 y.o. male.    Laceration Patient presents after laceration to right lower leg with some 10 earlier today.  Had been doing well around the house after it been dressed but had more bleeding and came in.  Had quick clot on it in triage.  Tetanus is up-to-date.  No other injury.     Prior to Admission medications   Medication Sig Start Date End Date Taking? Authorizing Provider  albuterol  (PROVENTIL ) (2.5 MG/3ML) 0.083% nebulizer solution Take 3 mLs (2.5 mg total) by nebulization every 4 (four) hours as needed for wheezing or shortness of breath. 05/28/23   Cranford, Tonya, NP  aspirin  81 MG tablet Take 81 mg by mouth daily.    [provider]  Blood Glucose Monitoring Suppl DEVI 1 each by Does not apply route daily. May substitute to any manufacturer covered by patient's insurance. 02/17/24   Wendee Lynwood HERO, NP  cholecalciferol  (VITAMIN D ) 1000 units tablet Take 1 tablet (1,000 Units total) by mouth daily. 06/03/16   Angiulli, Toribio PARAS, PA-C  citalopram  (CELEXA ) 20 MG tablet TAKE 1 TABLET BY MOUTH ONCE DAILY FOR  MOOD 02/26/23   Cranford, Tonya, NP  Cyanocobalamin  (VITAMIN B-12 PO) Take by mouth.    [provider]  cyclobenzaprine  (FLEXERIL ) 5 MG tablet Take 1 tab twice a day as needed for muscle spasms 09/02/23   Webb, Padonda B, FNP  esomeprazole  (NEXIUM ) 40 MG capsule Take  1 capsule   Daily  to  Prevent Heartburn & Indigestion 02/17/24   Wendee Lynwood HERO, NP  ezetimibe  (ZETIA ) 10 MG tablet Take 1 tablet (10 mg total) by mouth daily. for cholesterol. 11/24/23   Wendee Lynwood HERO, NP  Fluticasone -Umeclidin-Vilant (TRELEGY ELLIPTA ) 100-62.5-25 MCG/ACT AEPB INHALE 1 PUFF ONCE DAILY FOR ASTHMA 07/04/23   Wilkinson, Dana E, NP  Glucose Blood (BLOOD GLUCOSE TEST STRIPS) STRP 1 each by In Vitro  route daily. May substitute to any manufacturer covered by patient's insurance. 02/17/24   Wendee Lynwood HERO, NP  Lancets [facility] DELICA PLUS [place]) MISC Apply topically daily. 02/20/24   [provider]  Lancets Misc. MISC 1 each by Does not apply route daily. May substitute to any manufacturer covered by patient's insurance. 02/17/24   Wendee Lynwood HERO, NP  metFORMIN  (GLUCOPHAGE -XR) 500 MG 24 hr tablet Take  2 tablets   2 x / day  with Meals for Diabetes 10/15/22   Tonita Fallow, MD  metoprolol  tartrate (LOPRESSOR ) 50 MG tablet Take  1 tablet  2 x /day (every 12 hours) for BP TAKE 1 TABLET BY MOUTH TWICE DAILY (TAKE  EVERY  12  HOURS  FOR  BLOOD  PRESSURE) 04/14/23   Wilkinson, Dana E, NP  Multiple Vitamin (MULTIVITAMIN) tablet Take 1 tablet by mouth daily.    [provider]  olmesartan  (BENICAR ) 40 MG tablet Take 1 tablet at Night for BP 11/13/23   Wendee Lynwood HERO, NP  Omega-3 Fatty Acids (FISH OIL PO) Take by mouth daily.    [provider]  rosuvastatin  (CRESTOR ) 20 MG tablet Take 1 tablet (20 mg total) by mouth daily. for cholesterol. 11/13/23   Wendee Lynwood HERO, NP  Semaglutide ,0.25 or 0.5MG /DOS, (OZEMPIC , 0.25 OR 0.5 MG/DOSE,) 2 MG/3ML SOPN Inject 0.25 mg into the skin once a week. 11/13/23  Wendee Lynwood HERO, NP  triamcinolone  (KENALOG ) 0.025 % ointment Apply 1 Application topically 2 (two) times daily. Patient not taking: Reported on 03/11/2024 06/17/23   Laurice President, NP  VENTOLIN  HFA 108 (90 Base) MCG/ACT inhaler INHALE TWO PUFFS 15 MINUTES APART FOUR TIMES DAILY OR EVERY FOUR HOURS AS NEEDED TO RESCUE ASTHMA 05/28/23   Cranford, Tonya, NP  vitamin C  (ASCORBIC ACID ) 500 MG tablet Take 500 mg by mouth daily.    [provider]    Allergies: Patient has no known allergies.    Review of Systems  Updated Vital Signs BP 134/78 (BP Location: Right Arm)   Pulse 82   Temp 99 F (37.2 C) (Oral)   Resp 20   Ht 5' 11 (1.803 m)   Wt 108.9 kg   SpO2 95%   BMI  33.47 kg/m   Physical Exam Vitals and nursing note reviewed.  Skin:    Comments: Approximate 1 to 1-1/2 cm laceration on the mid anterior lower leg.  Wrapped and some mild oozing after removal.  Neurological:     Mental Status: He is alert.     (all labs ordered are listed, but only abnormal results are displayed) Labs Reviewed  COMPREHENSIVE METABOLIC PANEL WITH GFR - Abnormal; Notable for the following components:      Result Value   Glucose, Bld 125 (*)    All other components within normal limits  I-STAT CHEM 8, ED - Abnormal; Notable for the following components:   BUN 25 (*)    Glucose, Bld 127 (*)    Calcium , Ion 1.10 (*)    All other components within normal limits  CBC    EKG: None  Radiology: DG Tibia/Fibula Right Result Date: 03/22/2024 CLINICAL DATA:  injury EXAM: RIGHT TIBIA AND FIBULA - 2 VIEW COMPARISON:  None Available. FINDINGS: Remote, healed fracture of the proximal fibular shaft.No acute fracture or dislocation. There is no evidence of arthropathy or other focal bone abnormality. Soft tissues are unremarkable. No radiopaque foreign body. Curvilinear density along the medial aspect of the mid calf. IMPRESSION: 1. No acute fracture or dislocation. 2. Curvilinear density along the medial aspect of the mid calf, likely overlying bandage material. No radiopaque foreign body. Electronically Signed   By: Rogelia Myers M.D.   On: 03/22/2024 16:51     .Laceration Repair  Date/Time: 03/22/2024 8:00 PM  Performed by: Patsey Lot, MD Authorized by: Patsey Lot, MD   Consent:    Consent obtained:  Verbal   Consent given by:  Patient   Risks discussed:  Pain, retained foreign body, tendon damage, poor cosmetic result, need for additional repair, infection, nerve damage, poor wound healing and vascular damage   Alternatives discussed:  No treatment, delayed treatment and observation Laceration details:    Location:  Leg   Leg location:  R lower leg    Length (cm):  1.5 Pre-procedure details:    Preparation:  Patient was prepped and draped in usual sterile fashion and imaging obtained to evaluate for foreign bodies Exploration:    Limited defect created (wound extended): no     Hemostasis achieved with:  Direct pressure   Imaging obtained: x-ray     Imaging outcome: foreign body not noted     Wound exploration: wound explored through full range of motion   Treatment:    Area cleansed with:  Saline   Amount of cleaning:  Standard   Irrigation solution:  Sterile saline Skin repair:    Repair method:  Sutures   Suture size:  4-0   Suture material:  Prolene   Suture technique:  Simple interrupted   Number of sutures:  3 Approximation:    Approximation:  Loose Repair type:    Repair type:  Simple Post-procedure details:    Dressing:  Non-adherent dressing   Procedure completion:  Tolerated well, no immediate complications    Medications Ordered in the ED  lidocaine  (XYLOCAINE ) 2 % (with pres) injection 100 mg (100 mg Infiltration Given 03/22/24 2118)                                    Medical Decision Making Amount and/or Complexity of Data Reviewed Labs: ordered. Radiology: ordered.  Risk Prescription drug management.   Patient laceration right lower leg.  Wound closed.  X-ray does not show foreign body.  Hemoglobin reassuring.  Discharge home.  Sutures out in about a week.     Final diagnoses:  Laceration of right lower extremity, initial encounter    ED Discharge Orders     None          Patsey Lot, MD 03/22/24 2300

## 2024-03-23 ENCOUNTER — Telehealth: Payer: Self-pay | Admitting: Gastroenterology

## 2024-03-23 NOTE — Telephone Encounter (Signed)
 Inbound call from patients wife stating husband needed to get stitches on his leg yesterday 03/22/24 but is not on antibiotics or any pain killers just tylenol  if needed. Patients wife would like to know if he can still have his procedure on 9/18. Requesting a call back  Please advise  Thank you

## 2024-03-23 NOTE — Telephone Encounter (Signed)
 Left a VM that the patient will still be able to have his procedure on Thursday unless he is running a fever of 100 or greater.

## 2024-03-25 ENCOUNTER — Encounter: Payer: Self-pay | Admitting: Gastroenterology

## 2024-03-25 ENCOUNTER — Ambulatory Visit: Admitting: Gastroenterology

## 2024-03-25 VITALS — BP 135/64 | HR 64 | Temp 97.2°F | Resp 16 | Ht 71.0 in | Wt 240.0 lb

## 2024-03-25 DIAGNOSIS — Z1211 Encounter for screening for malignant neoplasm of colon: Secondary | ICD-10-CM | POA: Diagnosis not present

## 2024-03-25 DIAGNOSIS — R7303 Prediabetes: Secondary | ICD-10-CM | POA: Diagnosis not present

## 2024-03-25 DIAGNOSIS — E785 Hyperlipidemia, unspecified: Secondary | ICD-10-CM | POA: Diagnosis not present

## 2024-03-25 DIAGNOSIS — K648 Other hemorrhoids: Secondary | ICD-10-CM | POA: Diagnosis not present

## 2024-03-25 DIAGNOSIS — Z860100 Personal history of colon polyps, unspecified: Secondary | ICD-10-CM

## 2024-03-25 DIAGNOSIS — D175 Benign lipomatous neoplasm of intra-abdominal organs: Secondary | ICD-10-CM | POA: Diagnosis not present

## 2024-03-25 DIAGNOSIS — Z860101 Personal history of adenomatous and serrated colon polyps: Secondary | ICD-10-CM | POA: Diagnosis not present

## 2024-03-25 DIAGNOSIS — I10 Essential (primary) hypertension: Secondary | ICD-10-CM | POA: Diagnosis not present

## 2024-03-25 DIAGNOSIS — J449 Chronic obstructive pulmonary disease, unspecified: Secondary | ICD-10-CM | POA: Diagnosis not present

## 2024-03-25 MED ORDER — SODIUM CHLORIDE 0.9 % IV SOLN: 500.0000 mL | Freq: Once | INTRAVENOUS | Status: DC

## 2024-03-25 NOTE — Patient Instructions (Signed)
  Resume previous diet. Continue present medications. Repeat colonoscopy in 5 years for surveillance   YOU HAD AN ENDOSCOPIC PROCEDURE TODAY AT [facility]:   Refer to the procedure report that was given to you for any specific questions about what was found during the examination.  If the procedure report does not answer your questions, please call your gastroenterologist to clarify.  If you requested that your care partner not be given the details of your procedure findings, then the procedure report has been included in a sealed envelope for you to review at your convenience later.  YOU SHOULD EXPECT: Some feelings of bloating in the abdomen. Passage of more gas than usual.  Walking can help get rid of the air that was put into your GI tract during the procedure and reduce the bloating. If you had a lower endoscopy (such as a colonoscopy or flexible sigmoidoscopy) you may notice spotting of blood in your stool or on the toilet paper. If you underwent a bowel prep for your procedure, you may not have a normal bowel movement for a few days.  Please Note:  You might notice some irritation and congestion in your nose or some drainage.  This is from the oxygen used during your procedure.  There is no need for concern and it should clear up in a day or so.  SYMPTOMS TO REPORT IMMEDIATELY:  Following lower endoscopy (colonoscopy or flexible sigmoidoscopy):  Excessive amounts of blood in the stool  Significant tenderness or worsening of abdominal pains  Swelling of the abdomen that is new, acute  Fever of 100F or higher  For urgent or emergent issues, a gastroenterologist can be reached at any hour by calling [phone]. Do not use MyChart messaging for urgent concerns.    DIET:  We do recommend a small meal at first, but then you may proceed to your regular diet.  Drink plenty of fluids but you should avoid alcoholic beverages for 24 hours.  ACTIVITY:  You should plan to  take it easy for the rest of today and you should NOT DRIVE or use heavy machinery until tomorrow (because of the sedation medicines used during the test).    FOLLOW UP: Our staff will call the number listed on your records the next business day following your procedure.  We will call around 7:15- 8:00 am to check on you and address any questions or concerns that you may have regarding the information given to you following your procedure. If we do not reach you, we will leave a message.     If any biopsies were taken you will be contacted by phone or by letter within the next 1-3 weeks.  Please call us at [phone] if you have not heard about the biopsies in 3 weeks.    SIGNATURES/CONFIDENTIALITY: You and/or your care partner have signed paperwork which will be entered into your electronic medical record.  These signatures attest to the fact that that the information above on your After Visit Summary has been reviewed and is understood.  Full responsibility of the confidentiality of this discharge information lies with you and/or your care-partner.

## 2024-03-25 NOTE — Progress Notes (Signed)
 Report given to PACU, vss
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
## 2024-03-25 NOTE — Progress Notes (Signed)
 Stitches in right leg- cut on Monday  Pt's states no medical or surgical changes since previsit or office visit.

## 2024-03-25 NOTE — Op Note (Signed)
 [facility] Patient Name: [name] Procedure Date: 03/25/2024 8:39 AM MRN: [id number] Endoscopist: Victory L. Legrand , MD, [id number] Age: 63 Referring MD:  Date of Birth: [birth year] Gender: Male Account #: [id number] Procedure:                Colonoscopy Indications:              Surveillance: Personal history of adenomatous                            polyps on last colonoscopy 3 years ago                           6 tubular adenomas up to 10 mm in April 2022                           Prior colonoscopy 2011 Medicines:                Monitored Anesthesia Care Procedure:                Pre-Anesthesia Assessment:                           - Prior to the procedure, a History and Physical                            was performed, and patient medications and                            allergies were reviewed. The patient's tolerance of                            previous anesthesia was also reviewed. The risks                            and benefits of the procedure and the sedation                            options and risks were discussed with the patient.                            All questions were answered, and informed consent                            was obtained. Prior Anticoagulants: The patient has                            taken no anticoagulant or antiplatelet agents. ASA                            Grade Assessment: III - A patient with severe                            systemic disease. After reviewing the risks and  benefits, the patient was deemed in satisfactory                            condition to undergo the procedure.                           After obtaining informed consent, the colonoscope                            was passed under direct vision. Throughout the                            procedure, the patient's blood pressure, pulse, and                            oxygen saturations were monitored continuously. The                             CF [id number] #[id number] was introduced through the anus                            and advanced to the the cecum, identified by                            appendiceal orifice and ileocecal valve. The                            colonoscopy was performed without difficulty. The                            patient tolerated the procedure well. The quality                            of the bowel preparation was good. The ileocecal                            valve, appendiceal orifice, and rectum were                            photographed. Scope In: 8:49:19 AM Scope Out: 9:01:23 AM Scope Withdrawal Time: 0 hours 8 minutes 22 seconds  Total Procedure Duration: 0 hours 12 minutes 4 seconds  Findings:                 The perianal and digital rectal examinations were                            normal.                           Repeat examination of right colon under NBI                            performed.  There was a lipoma, in the ascending colon.                           Internal hemorrhoids were found. The hemorrhoids                            were small.                           The exam was otherwise without abnormality on                            direct and retroflexion views. Complications:            No immediate complications. Estimated Blood Loss:     Estimated blood loss: none. Impression:               - Lipoma in the ascending colon.                           - Internal hemorrhoids.                           - The examination was otherwise normal on direct                            and retroflexion views.                           - No specimens collected. Recommendation:           - Patient has a contact number available for                            emergencies. The signs and symptoms of potential                            delayed complications were discussed with the                            patient. Return to normal  activities tomorrow.                            Written discharge instructions were provided to the                            patient.                           - Resume previous diet.                           - Continue present medications.                           - Repeat colonoscopy in 5 years for surveillance. Malea Swilling L. Legrand, MD 03/25/2024 9:07:25 AM This report has been signed electronically.

## 2024-03-26 ENCOUNTER — Telehealth: Payer: Self-pay

## 2024-03-26 NOTE — Telephone Encounter (Signed)
Attempted to reach patient for post-procedure f/u call. No answer. Unable to leave message as voicemail is not set up. 

## 2024-04-01 ENCOUNTER — Ambulatory Visit: Admitting: Nurse Practitioner

## 2024-04-01 VITALS — BP 118/78 | HR 53 | Temp 97.8°F | Ht 71.0 in | Wt 234.0 lb

## 2024-04-01 DIAGNOSIS — Z09 Encounter for follow-up examination after completed treatment for conditions other than malignant neoplasm: Secondary | ICD-10-CM | POA: Insufficient documentation

## 2024-04-01 DIAGNOSIS — Z4802 Encounter for removal of sutures: Secondary | ICD-10-CM

## 2024-04-01 NOTE — Patient Instructions (Signed)
 Nice to see you today If the redness start spreading or you get pus or discharge from the wound let me know  Follow up with me as scheduled, sooner if you need me

## 2024-04-01 NOTE — Progress Notes (Signed)
   Acute Office Visit  Subjective:     Patient ID: [name], male    DOB: 04-Jun-1961, 63 y.o.   MRN: [id number]  Chief Complaint  Patient presents with   Hospitalization Follow-up    Pt complains of need for stitches out. States he had them for a 1 week and 3 days.     HPI Patient is in today for hosptial follow up  Discussed the use of AI scribe software for clinical note transcription with the patient, who gave verbal consent to proceed.  History of Present Illness [name] is a 63 year old male who presents for suture removal following a laceration.  He sustained a laceration while moving roofing material in his yard, which led to persistent bleeding and required hospital evaluation. Imaging was performed to rule out foreign bodies, and three sutures were placed to close the wound. The sutures have been in place for about ten days.  No itching, fever, chills, or discharge from the wound site. The area was initially red. His tetanus vaccination is up to date, with the last booster received approximately one month ago.  He underwent a colonoscopy on March 25, 2024, with no polyps found. Previous colonoscopies had identified polyps.   Review of Systems  Constitutional:  Negative for chills and fever.  Respiratory:  Negative for shortness of breath.   Cardiovascular:  Negative for chest pain.  Skin:  Negative for itching and rash.        Objective:    BP 118/78   Pulse (!) 53   Temp 97.8 F (36.6 C) (Oral)   Ht 5' 11 (1.803 m)   Wt 234 lb (106.1 kg)   SpO2 96%   BMI 32.64 kg/m    Physical Exam Vitals and nursing note reviewed.  Constitutional:      Appearance: Normal appearance.  Cardiovascular:     Rate and Rhythm: Normal rate and regular rhythm.     Heart sounds: Normal heart sounds.  Pulmonary:     Effort: Pulmonary effort is normal.     Breath sounds: Normal breath sounds.  Skin:    Findings: Lesion present.  Neurological:     Mental  Status: He is alert.     No results found for any visits on 04/01/24.      Assessment & Plan:   Problem List Items Addressed This Visit   None Visit Diagnoses       Hospital discharge follow-up    -  Primary     Visit for suture removal          Assessment and Plan Assessment & Plan Healing laceration of right lower extremity without foreign body Lower extremity laceration healing well, no infection signs, imaging confirmed no foreign body, tetanus vaccination up to date. - Remove sutures today. - Advise to keep area clean and dry. - Instruct to avoid scrubbing area hard to prevent scab removal. -S/S reviewed as when to RTC   No orders of the defined types were placed in this encounter.   Return if symptoms worsen or fail to improve, for As scheduled .  Adina Crandall, NP

## 2024-04-16 ENCOUNTER — Encounter: Payer: Self-pay | Admitting: Nurse Practitioner

## 2024-04-16 DIAGNOSIS — J449 Chronic obstructive pulmonary disease, unspecified: Secondary | ICD-10-CM

## 2024-04-16 MED ORDER — TRELEGY ELLIPTA 100-62.5-25 MCG/ACT IN AEPB: INHALATION_SPRAY | RESPIRATORY_TRACT | 0 refills | Status: DC

## 2024-06-01 ENCOUNTER — Ambulatory Visit (INDEPENDENT_AMBULATORY_CARE_PROVIDER_SITE_OTHER): Admitting: Nurse Practitioner

## 2024-06-01 VITALS — BP 132/70 | HR 58 | Temp 97.8°F | Ht 71.0 in | Wt 237.8 lb

## 2024-06-01 DIAGNOSIS — E1165 Type 2 diabetes mellitus with hyperglycemia: Secondary | ICD-10-CM | POA: Diagnosis not present

## 2024-06-01 DIAGNOSIS — Z7984 Long term (current) use of oral hypoglycemic drugs: Secondary | ICD-10-CM | POA: Diagnosis not present

## 2024-06-01 DIAGNOSIS — E782 Mixed hyperlipidemia: Secondary | ICD-10-CM | POA: Diagnosis not present

## 2024-06-01 LAB — POCT GLYCOSYLATED HEMOGLOBIN (HGB A1C): Hemoglobin A1C: 7.3 % — AB (ref 4.0–5.6)

## 2024-06-01 MED ORDER — EMPAGLIFLOZIN 10 MG PO TABS: 10.0000 mg | ORAL_TABLET | Freq: Every day | ORAL | 1 refills | Status: AC

## 2024-06-01 MED ORDER — CYCLOBENZAPRINE HCL 10 MG PO TABS: 10.0000 mg | ORAL_TABLET | Freq: Two times a day (BID) | ORAL | 0 refills | Status: AC | PRN

## 2024-06-01 MED ORDER — EZETIMIBE 10 MG PO TABS
10.0000 mg | ORAL_TABLET | Freq: Every day | ORAL | 1 refills | Status: AC
Start: 2024-06-01 — End: ?

## 2024-06-01 NOTE — Patient Instructions (Signed)
 Nice to see you today Your A1C was 7.3% today. I want it below 7 I have added on a medication called jardiance  that you will take daily Follow up with me in 3 months, sooner if you need me

## 2024-06-01 NOTE — Progress Notes (Signed)
 Established Patient Office Visit  Subjective   Patient ID: [name], male    DOB: [birth year]  Age: 63 y.o. MRN: [id number]  Chief Complaint  Patient presents with   Follow-up    HPI  Discussed the use of AI scribe software for clinical note transcription with the patient, who gave verbal consent to proceed.  History of Present Illness [name] is a 63 year old male with diabetes who presents for a follow-up on his diabetes management.  He manages his diabetes with metformin , taking two tablets twice a day. He has not started Ozempic . He checks his blood sugar at home two or three times with assistance from his wife but does not recall specific numbers. His last A1c was 7.2, and it has slightly increased to 7.3. He is working on weight loss and has reduced snacking, which he used to do frequently before losing weight.  His appetite is stable, typically eating a sandwich for lunch and having dinner, but he does not eat breakfast. He drinks water  and coffee, avoiding coffee on the day of the visit due to anticipated blood work. He reports regular bowel movements, typically once every morning.  He has a history of neck issues, having broken five vertebrae, which causes neck spasms. He was previously on Flexeril  10 mg for this, but was switched to a lower dose that he finds ineffective. He takes two or three pills of the lower dose when needed, but notes they do not provide relief. He rarely takes the medication unless his neck hurts.  No issues with urination, fever, chills, chest pain, or shortness of breath.    Review of Systems  Constitutional:  Negative for chills and fever.  Respiratory:  Negative for shortness of breath.   Cardiovascular:  Negative for chest pain.  Gastrointestinal:  Negative for constipation, diarrhea, nausea and vomiting.  Neurological:  Negative for headaches.  Psychiatric/Behavioral:  Negative for hallucinations and suicidal ideas.        Objective:     BP 132/70   Pulse (!) 58   Temp 97.8 F (36.6 C) (Oral)   Ht 5' 11 (1.803 m)   Wt 237 lb 12.8 oz (107.9 kg)   SpO2 95%   BMI 33.17 kg/m  BP Readings from Last 3 Encounters:  06/01/24 132/70  04/01/24 118/78  03/25/24 135/64   Wt Readings from Last 3 Encounters:  06/01/24 237 lb 12.8 oz (107.9 kg)  04/01/24 234 lb (106.1 kg)  03/25/24 240 lb (108.9 kg)   SpO2 Readings from Last 3 Encounters:  06/01/24 95%  04/01/24 96%  03/25/24 96%      Physical Exam Vitals and nursing note reviewed.  Constitutional:      Appearance: Normal appearance.  Cardiovascular:     Rate and Rhythm: Normal rate and regular rhythm.     Heart sounds: Normal heart sounds.  Pulmonary:     Effort: Pulmonary effort is normal.     Breath sounds: Normal breath sounds.  Abdominal:     General: Bowel sounds are normal.  Neurological:     Mental Status: He is alert.      Results for orders placed or performed in visit on 06/01/24  POCT glycosylated hemoglobin (Hb A1C)  Result Value Ref Range   Hemoglobin A1C 7.3 (A) 4.0 - 5.6 %   HbA1c POC (<> result, manual entry)     HbA1c, POC (prediabetic range)     HbA1c, POC (controlled diabetic range)  The 10-year ASCVD risk score (Arnett DK, et al., 2019) is: 23.2%    Assessment & Plan:   Problem List Items Addressed This Visit       Endocrine   Type 2 diabetes mellitus with hyperglycemia, without long-term current use of insulin  (HCC) - Primary   Relevant Medications   empagliflozin  (JARDIANCE ) 10 MG TABS tablet   Other Relevant Orders   POCT glycosylated hemoglobin (Hb A1C) (Completed)   Other Visit Diagnoses       Hyperlipidemia, mixed       Relevant Medications   ezetimibe  (ZETIA ) 10 MG tablet     Assessment and Plan Assessment & Plan Type 2 diabetes mellitus with hyperglycemia A1c increased from 7.2 to 7.3, indicating suboptimal glycemic control. Jardiance  chosen to improve A1c by increasing urinary  glucose excretion. Discussed potential side effect of yeast infections, though he is circumcised. - Added Jardiance  10 mg once daily. - Continue metformin  as prescribed. - Follow up in 3 months to reassess glycemic control.  Mixed hyperlipidemia Cholesterol levels were previously well-controlled.  Chronic neck pain with muscle spasms Chronic neck pain with muscle spasms, likely due to previous cervical spine injury. Current Flexeril  dose is ineffective. - Prescribed Flexeril  10 mg, instructed to take no more than two tablets at a time.   Return in about 3 months (around 09/01/2024) for DM recheck.    Brian Crandall, NP

## 2024-06-16 ENCOUNTER — Encounter: Payer: Medicare Other | Admitting: Nurse Practitioner

## 2024-07-02 ENCOUNTER — Encounter: Payer: Self-pay | Admitting: Nurse Practitioner

## 2024-07-02 ENCOUNTER — Other Ambulatory Visit: Payer: Self-pay | Admitting: Nurse Practitioner

## 2024-07-02 DIAGNOSIS — Z89512 Acquired absence of left leg below knee: Secondary | ICD-10-CM

## 2024-07-02 DIAGNOSIS — I10 Essential (primary) hypertension: Secondary | ICD-10-CM

## 2024-07-02 DIAGNOSIS — S069X3S Unspecified intracranial injury with loss of consciousness of 1 hour to 5 hours 59 minutes, sequela: Secondary | ICD-10-CM

## 2024-07-02 DIAGNOSIS — G546 Phantom limb syndrome with pain: Secondary | ICD-10-CM

## 2024-07-02 DIAGNOSIS — S143XXS Injury of brachial plexus, sequela: Secondary | ICD-10-CM

## 2024-07-05 ENCOUNTER — Encounter: Payer: Self-pay | Admitting: Nurse Practitioner

## 2024-07-05 MED ORDER — METOPROLOL TARTRATE 50 MG PO TABS: ORAL_TABLET | ORAL | 3 refills | Status: AC

## 2024-07-05 MED ORDER — CITALOPRAM HYDROBROMIDE 20 MG PO TABS: ORAL_TABLET | ORAL | 3 refills | Status: AC

## 2024-07-05 NOTE — Telephone Encounter (Signed)
 Last written 02-26-23 #90/3 Last OV 06-01-24 Next OV 09-01-24 [address]

## 2024-07-05 NOTE — Telephone Encounter (Signed)
 Last written 04-14-23 #180/3 Last OV 06-01-24 Next OV 09-01-24 [address]

## 2024-07-06 ENCOUNTER — Encounter: Payer: Self-pay | Admitting: Nurse Practitioner

## 2024-07-16 ENCOUNTER — Other Ambulatory Visit: Payer: Self-pay | Admitting: Primary Care

## 2024-07-16 DIAGNOSIS — J449 Chronic obstructive pulmonary disease, unspecified: Secondary | ICD-10-CM

## 2024-07-18 ENCOUNTER — Encounter: Payer: Self-pay | Admitting: Nurse Practitioner

## 2024-08-12 NOTE — Progress Notes (Signed)
 [name]                                          MRN: [id number]   08/12/2024   The [facility] Quality Team Specialist reviewed this patient medical record for the purposes of chart review for care gap closure. The following were reviewed: chart review for care gap closure-kidney health evaluation for diabetes:eGFR  and uACR.    [facility] Quality Team

## 2024-09-01 ENCOUNTER — Ambulatory Visit: Admitting: Nurse Practitioner
